# Patient Record
Sex: Female | Born: 1935 | Race: White | Hispanic: No | Smoking: Former smoker
Health system: Southern US, Community
[De-identification: ages and names within clinical notes are randomized; demographics above are authoritative.]

## PROBLEM LIST (undated history)

## (undated) DIAGNOSIS — Z860100 Personal history of colon polyps, unspecified: Secondary | ICD-10-CM

## (undated) DIAGNOSIS — E785 Hyperlipidemia, unspecified: Secondary | ICD-10-CM

## (undated) DIAGNOSIS — F32A Depression, unspecified: Secondary | ICD-10-CM

## (undated) DIAGNOSIS — K219 Gastro-esophageal reflux disease without esophagitis: Secondary | ICD-10-CM

## (undated) DIAGNOSIS — J449 Chronic obstructive pulmonary disease, unspecified: Secondary | ICD-10-CM

## (undated) DIAGNOSIS — M1990 Unspecified osteoarthritis, unspecified site: Secondary | ICD-10-CM

## (undated) DIAGNOSIS — F329 Major depressive disorder, single episode, unspecified: Secondary | ICD-10-CM

## (undated) DIAGNOSIS — I10 Essential (primary) hypertension: Secondary | ICD-10-CM

## (undated) HISTORY — PX: CHOLECYSTECTOMY: SHX55

## (undated) HISTORY — DX: Personal history of colonic polyps: Z86.010

## (undated) HISTORY — DX: Gastro-esophageal reflux disease without esophagitis: K21.9

## (undated) HISTORY — DX: Unspecified osteoarthritis, unspecified site: M19.90

## (undated) HISTORY — DX: Chronic obstructive pulmonary disease, unspecified: J44.9

## (undated) HISTORY — DX: Major depressive disorder, single episode, unspecified: F32.9

## (undated) HISTORY — PX: TONSILLECTOMY: SUR1361

## (undated) HISTORY — DX: Hyperlipidemia, unspecified: E78.5

## (undated) HISTORY — PX: ABDOMINAL HYSTERECTOMY: SHX81

## (undated) HISTORY — PX: OTHER SURGICAL HISTORY: SHX169

## (undated) HISTORY — DX: Depression, unspecified: F32.A

## (undated) HISTORY — PX: APPENDECTOMY: SHX54

## (undated) HISTORY — PX: BREAST EXCISIONAL BIOPSY: SUR124

## (undated) HISTORY — PX: TUBAL LIGATION: SHX77

## (undated) HISTORY — PX: CATARACT EXTRACTION, BILATERAL: SHX1313

## (undated) HISTORY — DX: Essential (primary) hypertension: I10

## (undated) HISTORY — DX: Personal history of colon polyps, unspecified: Z86.0100

## (undated) HISTORY — PX: CATARACT EXTRACTION: SUR2

---

## 1998-09-08 ENCOUNTER — Encounter: Payer: Self-pay | Admitting: General Surgery

## 1998-09-08 ENCOUNTER — Ambulatory Visit (HOSPITAL_COMMUNITY): Admission: RE | Admit: 1998-09-08 | Discharge: 1998-09-09 | Payer: Self-pay | Admitting: General Surgery

## 2000-07-14 ENCOUNTER — Encounter: Admission: RE | Admit: 2000-07-14 | Discharge: 2000-07-14 | Payer: Self-pay | Admitting: Internal Medicine

## 2000-07-14 ENCOUNTER — Encounter: Payer: Self-pay | Admitting: Internal Medicine

## 2002-06-13 ENCOUNTER — Encounter: Admission: RE | Admit: 2002-06-13 | Discharge: 2002-06-13 | Payer: Self-pay | Admitting: Internal Medicine

## 2002-06-13 ENCOUNTER — Encounter: Payer: Self-pay | Admitting: Internal Medicine

## 2002-07-09 ENCOUNTER — Encounter: Payer: Self-pay | Admitting: Internal Medicine

## 2003-03-11 ENCOUNTER — Encounter: Admission: RE | Admit: 2003-03-11 | Discharge: 2003-03-11 | Payer: Self-pay | Admitting: Internal Medicine

## 2003-12-02 ENCOUNTER — Ambulatory Visit: Payer: Self-pay | Admitting: Internal Medicine

## 2004-06-16 ENCOUNTER — Ambulatory Visit: Payer: Self-pay | Admitting: Internal Medicine

## 2004-06-24 ENCOUNTER — Ambulatory Visit (HOSPITAL_COMMUNITY): Admission: RE | Admit: 2004-06-24 | Discharge: 2004-06-24 | Payer: Self-pay | Admitting: Internal Medicine

## 2004-06-24 ENCOUNTER — Encounter: Admission: RE | Admit: 2004-06-24 | Discharge: 2004-06-24 | Payer: Self-pay | Admitting: Internal Medicine

## 2004-07-09 ENCOUNTER — Ambulatory Visit: Payer: Self-pay | Admitting: Gastroenterology

## 2004-07-14 ENCOUNTER — Ambulatory Visit: Payer: Self-pay | Admitting: Internal Medicine

## 2004-08-05 ENCOUNTER — Ambulatory Visit: Payer: Self-pay | Admitting: Gastroenterology

## 2004-08-05 ENCOUNTER — Encounter: Payer: Self-pay | Admitting: Internal Medicine

## 2004-08-31 ENCOUNTER — Emergency Department (HOSPITAL_COMMUNITY): Admission: EM | Admit: 2004-08-31 | Discharge: 2004-09-01 | Payer: Self-pay | Admitting: Emergency Medicine

## 2004-10-13 ENCOUNTER — Ambulatory Visit: Payer: Self-pay | Admitting: Internal Medicine

## 2004-11-13 ENCOUNTER — Ambulatory Visit: Payer: Self-pay | Admitting: Internal Medicine

## 2004-12-25 ENCOUNTER — Ambulatory Visit: Payer: Self-pay | Admitting: Internal Medicine

## 2005-08-23 ENCOUNTER — Ambulatory Visit: Payer: Self-pay | Admitting: Internal Medicine

## 2005-09-24 ENCOUNTER — Ambulatory Visit: Payer: Self-pay | Admitting: Internal Medicine

## 2005-10-06 ENCOUNTER — Ambulatory Visit: Payer: Self-pay | Admitting: Internal Medicine

## 2005-10-08 ENCOUNTER — Encounter: Payer: Self-pay | Admitting: Internal Medicine

## 2005-11-09 ENCOUNTER — Ambulatory Visit: Payer: Self-pay | Admitting: Internal Medicine

## 2005-11-20 ENCOUNTER — Encounter: Admission: RE | Admit: 2005-11-20 | Discharge: 2005-11-20 | Payer: Self-pay | Admitting: Otolaryngology

## 2006-03-13 DIAGNOSIS — I10 Essential (primary) hypertension: Secondary | ICD-10-CM | POA: Insufficient documentation

## 2006-03-13 DIAGNOSIS — J449 Chronic obstructive pulmonary disease, unspecified: Secondary | ICD-10-CM | POA: Insufficient documentation

## 2006-09-06 ENCOUNTER — Encounter (INDEPENDENT_AMBULATORY_CARE_PROVIDER_SITE_OTHER): Payer: Self-pay | Admitting: *Deleted

## 2006-10-10 ENCOUNTER — Ambulatory Visit: Payer: Self-pay | Admitting: Internal Medicine

## 2006-10-10 DIAGNOSIS — F329 Major depressive disorder, single episode, unspecified: Secondary | ICD-10-CM | POA: Insufficient documentation

## 2006-10-10 DIAGNOSIS — F3289 Other specified depressive episodes: Secondary | ICD-10-CM | POA: Insufficient documentation

## 2006-10-10 DIAGNOSIS — K449 Diaphragmatic hernia without obstruction or gangrene: Secondary | ICD-10-CM | POA: Insufficient documentation

## 2006-10-10 DIAGNOSIS — K219 Gastro-esophageal reflux disease without esophagitis: Secondary | ICD-10-CM | POA: Insufficient documentation

## 2006-10-10 DIAGNOSIS — E785 Hyperlipidemia, unspecified: Secondary | ICD-10-CM | POA: Insufficient documentation

## 2006-10-13 LAB — CONVERTED CEMR LAB
ALT: 19 units/L (ref 0–35)
BUN: 13 mg/dL (ref 6–23)
Basophils Relative: 0.7 % (ref 0.0–1.0)
CO2: 30 meq/L (ref 19–32)
Chloride: 98 meq/L (ref 96–112)
Eosinophils Relative: 2 % (ref 0.0–5.0)
GFR calc non Af Amer: 51 mL/min
Glucose, Bld: 177 mg/dL — ABNORMAL HIGH (ref 70–99)
HDL: 26.7 mg/dL — ABNORMAL LOW (ref >39.0)
Hemoglobin: 14.1 g/dL (ref 12.0–15.0)
Hgb A1c MFr Bld: 8.2 % — ABNORMAL HIGH (ref 4.6–6.0)
Platelets: 292 10*3/uL (ref 150–400)
Potassium: 3.8 meq/L (ref 3.5–5.1)
RDW: 13.8 % (ref 11.5–14.6)
Sodium: 139 meq/L (ref 135–145)
Total CHOL/HDL Ratio: 6.1
VLDL: 39 mg/dL (ref 0–40)

## 2006-11-02 ENCOUNTER — Encounter: Payer: Self-pay | Admitting: Internal Medicine

## 2006-11-02 ENCOUNTER — Encounter: Admission: RE | Admit: 2006-11-02 | Discharge: 2006-11-02 | Payer: Self-pay | Admitting: Internal Medicine

## 2006-11-17 ENCOUNTER — Encounter (INDEPENDENT_AMBULATORY_CARE_PROVIDER_SITE_OTHER): Payer: Self-pay | Admitting: *Deleted

## 2006-11-22 ENCOUNTER — Encounter (INDEPENDENT_AMBULATORY_CARE_PROVIDER_SITE_OTHER): Payer: Self-pay | Admitting: *Deleted

## 2006-12-12 ENCOUNTER — Ambulatory Visit: Payer: Self-pay | Admitting: Internal Medicine

## 2006-12-12 DIAGNOSIS — E119 Type 2 diabetes mellitus without complications: Secondary | ICD-10-CM | POA: Insufficient documentation

## 2006-12-12 DIAGNOSIS — E1140 Type 2 diabetes mellitus with diabetic neuropathy, unspecified: Secondary | ICD-10-CM | POA: Insufficient documentation

## 2006-12-12 LAB — CONVERTED CEMR LAB: Glucose, Bld: 136 mg/dL

## 2006-12-13 LAB — CONVERTED CEMR LAB
ALT: 16 units/L (ref 0–35)
AST: 24 units/L (ref 0–37)
BUN: 22 mg/dL (ref 6–23)
Creatinine, Ser: 1.2 mg/dL (ref 0.4–1.2)
Microalb Creat Ratio: 51.4 mg/g — ABNORMAL HIGH (ref 0.0–30.0)

## 2007-04-11 ENCOUNTER — Ambulatory Visit: Payer: Self-pay | Admitting: Internal Medicine

## 2007-04-14 ENCOUNTER — Telehealth (INDEPENDENT_AMBULATORY_CARE_PROVIDER_SITE_OTHER): Payer: Self-pay | Admitting: *Deleted

## 2007-04-14 LAB — CONVERTED CEMR LAB
Calcium: 9.2 mg/dL (ref 8.4–10.5)
Chloride: 98 meq/L (ref 96–112)
Microalb Creat Ratio: 57.9 mg/g — ABNORMAL HIGH (ref 0.0–30.0)
Microalb, Ur: 11.9 mg/dL — ABNORMAL HIGH (ref 0.0–1.9)
Sodium: 141 meq/L (ref 135–145)

## 2007-04-18 ENCOUNTER — Encounter: Payer: Self-pay | Admitting: Internal Medicine

## 2007-05-02 ENCOUNTER — Ambulatory Visit: Payer: Self-pay | Admitting: Internal Medicine

## 2007-05-02 DIAGNOSIS — J309 Allergic rhinitis, unspecified: Secondary | ICD-10-CM | POA: Insufficient documentation

## 2007-05-04 LAB — CONVERTED CEMR LAB
GFR calc Af Amer: 78 mL/min
Glucose, Bld: 100 mg/dL — ABNORMAL HIGH (ref 70–99)

## 2007-08-03 ENCOUNTER — Ambulatory Visit: Payer: Self-pay | Admitting: Internal Medicine

## 2007-08-07 ENCOUNTER — Telehealth (INDEPENDENT_AMBULATORY_CARE_PROVIDER_SITE_OTHER): Payer: Self-pay | Admitting: *Deleted

## 2007-08-07 LAB — CONVERTED CEMR LAB
Creatinine,U: 395.5 mg/dL
Hgb A1c MFr Bld: 7.3 % — ABNORMAL HIGH (ref 4.6–6.0)
Microalb Creat Ratio: 44.5 mg/g — ABNORMAL HIGH (ref 0.0–30.0)
Microalb, Ur: 17.6 mg/dL — ABNORMAL HIGH (ref 0.0–1.9)

## 2007-08-24 ENCOUNTER — Ambulatory Visit: Payer: Self-pay | Admitting: Internal Medicine

## 2007-08-29 ENCOUNTER — Telehealth (INDEPENDENT_AMBULATORY_CARE_PROVIDER_SITE_OTHER): Payer: Self-pay | Admitting: *Deleted

## 2007-08-29 LAB — CONVERTED CEMR LAB
CO2: 29 meq/L (ref 19–32)
Calcium: 8.9 mg/dL (ref 8.4–10.5)
Chloride: 106 meq/L (ref 96–112)
Creatinine, Ser: 0.9 mg/dL (ref 0.4–1.2)
GFR calc Af Amer: 78 mL/min
Glucose, Bld: 131 mg/dL — ABNORMAL HIGH (ref 70–99)
Sodium: 141 meq/L (ref 135–145)

## 2007-10-25 ENCOUNTER — Telehealth (INDEPENDENT_AMBULATORY_CARE_PROVIDER_SITE_OTHER): Payer: Self-pay | Admitting: *Deleted

## 2007-11-03 ENCOUNTER — Ambulatory Visit: Payer: Self-pay | Admitting: Internal Medicine

## 2007-11-06 LAB — CONVERTED CEMR LAB
ALT: 30 units/L (ref 0–35)
Cholesterol: 139 mg/dL (ref 0–200)

## 2007-11-07 ENCOUNTER — Telehealth (INDEPENDENT_AMBULATORY_CARE_PROVIDER_SITE_OTHER): Payer: Self-pay | Admitting: *Deleted

## 2007-12-19 ENCOUNTER — Ambulatory Visit: Payer: Self-pay | Admitting: Internal Medicine

## 2008-03-08 ENCOUNTER — Ambulatory Visit: Payer: Self-pay | Admitting: Internal Medicine

## 2008-03-08 ENCOUNTER — Encounter (INDEPENDENT_AMBULATORY_CARE_PROVIDER_SITE_OTHER): Payer: Self-pay | Admitting: *Deleted

## 2008-03-12 ENCOUNTER — Telehealth (INDEPENDENT_AMBULATORY_CARE_PROVIDER_SITE_OTHER): Payer: Self-pay | Admitting: *Deleted

## 2008-03-12 LAB — CONVERTED CEMR LAB
BUN: 15 mg/dL (ref 6–23)
Basophils Absolute: 0.1 10*3/uL (ref 0.0–0.1)
Calcium: 9.2 mg/dL (ref 8.4–10.5)
Creatinine, Ser: 0.9 mg/dL (ref 0.4–1.2)
GFR calc non Af Amer: 64 mL/min
HCT: 39.1 % (ref 36.0–46.0)
Microalb Creat Ratio: 211.8 mg/g — ABNORMAL HIGH (ref 0.0–30.0)
Platelets: 281 10*3/uL (ref 150–400)
Potassium: 4 meq/L (ref 3.5–5.1)
RBC: 4.96 M/uL (ref 3.87–5.11)
RDW: 14.1 % (ref 11.5–14.6)

## 2008-03-26 ENCOUNTER — Encounter: Admission: RE | Admit: 2008-03-26 | Discharge: 2008-03-26 | Payer: Self-pay | Admitting: Internal Medicine

## 2008-03-26 LAB — HM MAMMOGRAPHY: HM Mammogram: NEGATIVE

## 2008-04-17 ENCOUNTER — Encounter: Payer: Self-pay | Admitting: Internal Medicine

## 2008-04-24 ENCOUNTER — Ambulatory Visit: Payer: Self-pay | Admitting: Internal Medicine

## 2008-07-08 ENCOUNTER — Ambulatory Visit: Payer: Self-pay | Admitting: Internal Medicine

## 2008-07-08 LAB — HM DIABETES FOOT EXAM

## 2008-07-08 LAB — CONVERTED CEMR LAB
Creatinine,U: 296.8 mg/dL
Hgb A1c MFr Bld: 7.1 % — ABNORMAL HIGH (ref 4.6–6.5)
Microalb Creat Ratio: 252 mg/g — ABNORMAL HIGH (ref 0.0–30.0)
Microalb, Ur: 74.8 mg/dL — ABNORMAL HIGH (ref 0.0–1.9)

## 2008-07-09 ENCOUNTER — Ambulatory Visit: Payer: Self-pay | Admitting: Internal Medicine

## 2008-07-10 ENCOUNTER — Encounter (INDEPENDENT_AMBULATORY_CARE_PROVIDER_SITE_OTHER): Payer: Self-pay | Admitting: *Deleted

## 2008-07-15 LAB — CONVERTED CEMR LAB
Ferritin: 9.5 ng/mL — ABNORMAL LOW (ref 10.0–291.0)
Iron: 29 ug/dL — ABNORMAL LOW (ref 42–145)

## 2008-07-16 ENCOUNTER — Encounter (INDEPENDENT_AMBULATORY_CARE_PROVIDER_SITE_OTHER): Payer: Self-pay | Admitting: *Deleted

## 2008-07-26 ENCOUNTER — Ambulatory Visit: Payer: Self-pay | Admitting: Internal Medicine

## 2008-07-26 LAB — CONVERTED CEMR LAB: Fecal Occult Bld: NEGATIVE

## 2008-07-31 ENCOUNTER — Telehealth (INDEPENDENT_AMBULATORY_CARE_PROVIDER_SITE_OTHER): Payer: Self-pay | Admitting: *Deleted

## 2008-08-14 DIAGNOSIS — Z860100 Personal history of colon polyps, unspecified: Secondary | ICD-10-CM | POA: Insufficient documentation

## 2008-08-21 ENCOUNTER — Ambulatory Visit: Payer: Self-pay | Admitting: Gastroenterology

## 2008-08-21 DIAGNOSIS — D509 Iron deficiency anemia, unspecified: Secondary | ICD-10-CM | POA: Insufficient documentation

## 2008-08-21 DIAGNOSIS — K625 Hemorrhage of anus and rectum: Secondary | ICD-10-CM | POA: Insufficient documentation

## 2008-08-21 LAB — CONVERTED CEMR LAB
ALT: 12 units/L (ref 0–35)
AST: 20 units/L (ref 0–37)
Alkaline Phosphatase: 68 units/L (ref 39–117)
Basophils Absolute: 0.1 10*3/uL (ref 0.0–0.1)
Basophils Relative: 1 % (ref 0.0–3.0)
Bilirubin, Direct: 0.1 mg/dL (ref 0.0–0.3)
CO2: 32 meq/L (ref 19–32)
Creatinine, Ser: 1 mg/dL (ref 0.4–1.2)
GFR calc non Af Amer: 56.63 mL/min (ref >60)
Glucose, Bld: 117 mg/dL — ABNORMAL HIGH (ref 70–99)
Iron: 37 ug/dL — ABNORMAL LOW (ref 42–145)
Lymphocytes Relative: 27.2 % (ref 12.0–46.0)
Neutrophils Relative %: 61.4 % (ref 43.0–77.0)
Platelets: 310 10*3/uL (ref 150.0–400.0)
RBC: 4.95 M/uL (ref 3.87–5.11)
Total Bilirubin: 0.5 mg/dL (ref 0.3–1.2)
Transferrin: 305.3 mg/dL (ref 212.0–360.0)
Vitamin B-12: 364 pg/mL (ref 211–911)
WBC: 10 10*3/uL (ref 4.5–10.5)

## 2008-09-09 ENCOUNTER — Ambulatory Visit: Payer: Self-pay | Admitting: Gastroenterology

## 2008-09-09 LAB — HM COLONOSCOPY

## 2008-11-07 ENCOUNTER — Ambulatory Visit: Payer: Self-pay | Admitting: Internal Medicine

## 2008-11-13 ENCOUNTER — Encounter (INDEPENDENT_AMBULATORY_CARE_PROVIDER_SITE_OTHER): Payer: Self-pay | Admitting: *Deleted

## 2008-11-13 LAB — CONVERTED CEMR LAB
Creatinine,U: 291.3 mg/dL
Hgb A1c MFr Bld: 6.5 % (ref 4.6–6.5)
Microalb Creat Ratio: 135.6 mg/g — ABNORMAL HIGH (ref 0.0–30.0)
Triglycerides: 105 mg/dL (ref 0.0–149.0)

## 2009-03-10 ENCOUNTER — Ambulatory Visit: Payer: Self-pay | Admitting: Internal Medicine

## 2009-03-12 LAB — CONVERTED CEMR LAB
BUN: 18 mg/dL (ref 6–23)
Calcium: 9.3 mg/dL (ref 8.4–10.5)
Chloride: 103 meq/L (ref 96–112)
Creatinine, Ser: 1 mg/dL (ref 0.4–1.2)

## 2009-03-31 ENCOUNTER — Ambulatory Visit: Payer: Self-pay | Admitting: Family

## 2009-04-17 ENCOUNTER — Encounter: Payer: Self-pay | Admitting: Internal Medicine

## 2009-04-17 LAB — HM DIABETES EYE EXAM: HM Diabetic Eye Exam: NORMAL

## 2009-07-08 ENCOUNTER — Ambulatory Visit: Payer: Self-pay | Admitting: Internal Medicine

## 2009-07-08 DIAGNOSIS — R220 Localized swelling, mass and lump, head: Secondary | ICD-10-CM | POA: Insufficient documentation

## 2009-07-08 DIAGNOSIS — R221 Localized swelling, mass and lump, neck: Secondary | ICD-10-CM

## 2009-07-08 DIAGNOSIS — M1990 Unspecified osteoarthritis, unspecified site: Secondary | ICD-10-CM | POA: Insufficient documentation

## 2009-07-08 LAB — CONVERTED CEMR LAB: Vit D, 25-Hydroxy: 47 ng/mL (ref 30–89)

## 2009-07-14 ENCOUNTER — Ambulatory Visit: Payer: Self-pay | Admitting: Internal Medicine

## 2009-07-14 ENCOUNTER — Encounter: Payer: Self-pay | Admitting: Internal Medicine

## 2009-07-15 LAB — CONVERTED CEMR LAB
AST: 35 units/L (ref 0–37)
Basophils Absolute: 0.1 10*3/uL (ref 0.0–0.1)
Eosinophils Absolute: 0.2 10*3/uL (ref 0.0–0.7)
HCT: 38.4 % (ref 36.0–46.0)
Hemoglobin: 12.4 g/dL (ref 12.0–15.0)
Hgb A1c MFr Bld: 6.7 % — ABNORMAL HIGH (ref 4.6–6.5)
Iron: 40 ug/dL — ABNORMAL LOW (ref 42–145)
MCHC: 32.2 g/dL (ref 30.0–36.0)
Monocytes Absolute: 1 10*3/uL (ref 0.1–1.0)
Neutrophils Relative %: 62.2 % (ref 43.0–77.0)
Platelets: 304 10*3/uL (ref 150.0–400.0)
Saturation Ratios: 8.7 % — ABNORMAL LOW (ref 20.0–50.0)
Total CHOL/HDL Ratio: 4
Transferrin: 327.7 mg/dL (ref 212.0–360.0)
Triglycerides: 143 mg/dL (ref 0.0–149.0)

## 2009-11-07 ENCOUNTER — Ambulatory Visit: Payer: Self-pay | Admitting: Internal Medicine

## 2009-11-11 LAB — CONVERTED CEMR LAB
Calcium: 9.1 mg/dL (ref 8.4–10.5)
Creatinine, Ser: 0.9 mg/dL (ref 0.4–1.2)
GFR calc non Af Amer: 68.1 mL/min (ref >60)
Hemoglobin: 12.2 g/dL (ref 12.0–15.0)
Sodium: 142 meq/L (ref 135–145)

## 2010-02-04 ENCOUNTER — Telehealth (INDEPENDENT_AMBULATORY_CARE_PROVIDER_SITE_OTHER): Payer: Self-pay | Admitting: *Deleted

## 2010-02-19 NOTE — Assessment & Plan Note (Signed)
Summary: yearly checkup/fasting/kdc   Vital Signs:  Patient profile:   77 year old female Height:      66.25 inches Weight:      205 pounds Temp:     98.3. degrees F oral Pulse rate:   82 / minute Resp:     24 per minute BP sitting:   140 / 70  (left arm)  Vitals Entered By: Rolla Flatten CMA (July 08, 2009 10:05 AM) CC: yearly Comments -fasting REVIEWED MED LIST, PATIENT AGREED DOSE AND INSTRUCTION CORRECT    History of Present Illness: yearly, chart reviewed   c/o knee pain on-off, occasionally swelling, does hurt at night has taken tylenol, helped some? tried celebrex years ago  c/o a mass at the neck, no pain, no dyscomfort ; started years ago, more noticeable lately   HYPERTENSION  -- ambulatory BPs ususally in the 140s   AODM  -- good medication compliance , no ambulatory CBGs   HYPERLIPIDEMIA -- good medication compliance      Allergies (verified): No Known Drug Allergies  Past History:  Past Medical History: Reviewed history from 03/10/2009 and no changes required. HYPERTENSION   AODM   HYPERLIPIDEMIA  DEPRESSION   COPD and  ASTHMA  GERD  and  HH  WITH REFLUX   COLONIC POLYPS  Allergic rhinitis  Past Surgical History: Reviewed history from 08/21/2008 and no changes required. Appendectomy Cataract extraction-bilateral Cholecystectomy Tubal ligation Tonsillectomy Hysterectomy Bilateral Breast Tumor  Family History: Reviewed history from 08/21/2008 and no changes required. breast cancer: sister (x2?) colon cancer : no DM: F, sister Heart attacks : brother (early 25s), Son Family History of Pancreatic Cancer: Son Family History of Stomach Cancer: Sister  Social History: Reviewed history from 08/21/2008 and no changes required. daughter lives w/her, has another daughter widow (1994) Has lost 3 sons Retired Patient is a former smoker. Quit 1994 Alcohol Use - no Illicit Drug Use - no Patient does not get regular exercise.   Review  of Systems General:  Denies fatigue, fever, and weight loss. CV:  Denies chest pain or discomfort and swelling of feet. Resp:  uses spiriva daily, uses ventolin almost once daily (increased compared to last few months); thinks wheezing triggered by the warm weather  . GI:  hematochezia has decreased  on benifiber and colace, has soft stools and no constipation . GU:  no vag d/c or bleed  does SBE, ok . Psych:  Denies anxiety and depression; mood ok .  Physical Exam  General:  alert, well-developed, and well-nourished.   Neck:  no thyromegaly.   under the left side of the jaw, has a 1.5 inch mass, soft, nontender, not  hard and not attached deeper structures Breasts:  No mass, nodules, thickening, tenderness, bulging, retraction, inflamation, nipple discharge or skin changes noted.  no axillary lymphadenopathy  Lungs:  normal respiratory effort, no intercostal retractions, no accessory muscle use, and decreased  breath sounds but clear  Heart:  normal rate, regular rhythm, no murmur, and no gallop.   Abdomen:  soft, non-tender, and no distention.   Extremities:  no roller check edema Both knees has deformities consistent with OA. No warmness, no redness. Very mild effusion on the left side? Psych:  Oriented X3, memory intact for recent and remote, normally interactive, good eye contact, not anxious appearing, and not depressed appearing.     Impression & Recommendations:  Problem # 1:  DEGENERATIVE JOINT DISEASE (ICD-715.90) knee pain likely from osteoarthritis Plan: Trial with a low  dose of Celebrex, continue Tylenol Her updated medication list for this problem includes:    Bayer Low Strength 81 Mg Tbec (Aspirin) .Marland Kitchen... Take 1 tablet by mouth once a day    Celebrex 100 Mg Caps (Celecoxib) ..... One by mouth twice a day as needed for pain. take it with food  Problem # 2:  HEALTH SCREENING (ICD-V70.0) Td 2002 pneumonia shot 2010 shingles shot-- gave  one today  breast exam today  is neg MMG was neg 03-2008, patient knows she is due. Plans to call no further Paps  Cscope 2004 : tics, hemorrhoids  had rectal bleeding and anemia, colonoscopyt 08/2008 : tics, no polyps  Last bone density test 10/08--- normal, ordering one  Orders: Radiology Referral (Radiology)  Problem # 3:  NECK MASS (ICD-784.2) the left sided neck mass is more noticeable lately today's physical exam features are  benign She is a status post eval  at ENT in 2007, a CT showed possible lipoma, they recommend observation Plan:  Observation versus ENT referral we elected to recheck in few months   Problem # 4:  ANEMIA, IRON DEFICIENCY (ICD-280.9) had a colonoscopy last year rectal bleeding  has decreased in frequency Labs   Hgb: 13.2 (11/07/2008)   Hct: 38.3 (08/21/2008)   Platelets: 310.0 (08/21/2008) RBC: 4.95 (08/21/2008)   RDW: 15.6 (08/21/2008)   WBC: 10.0 (08/21/2008) MCV: 77.3 (08/21/2008)   MCHC: 32.2 (08/21/2008) Ferritin: 8.9 (08/21/2008) Iron: 37 (08/21/2008)   % Sat: 8.7 (08/21/2008) B12: 364 (08/21/2008)   Folate: >20.0 ng/mL (08/21/2008)   TSH: 2.30 (08/21/2008)  Orders: TLB-CBC Platelet - w/Differential (85025-CBCD) TLB-IBC Pnl (Iron/FE;Transferrin) (83550-IBC)  Problem # 5:  AODM (ICD-250.00) labs Her updated medication list for this problem includes:    Benazepril Hcl 20 Mg Tabs (Benazepril hcl) .Marland Kitchen... 1 by mouth bid    Glucophage 500 Mg Tabs (Metformin hcl) .Marland Kitchen... 2 by mouth once daily    Bayer Low Strength 81 Mg Tbec (Aspirin) .Marland Kitchen... Take 1 tablet by mouth once a day    Labs Reviewed: Creat: 1.0 (03/10/2009)     Last Eye Exam: normal (04/17/2009) Reviewed HgBA1c results: 6.6 (03/10/2009)  6.5 (11/07/2008)  Orders: TLB-A1C / Hgb A1C (Glycohemoglobin) (83036-A1C)  Problem # 6:  HYPERTENSION (ICD-401.9) no change, labs Her updated medication list for this problem includes:    Benazepril Hcl 20 Mg Tabs (Benazepril hcl) .Marland Kitchen... 1 by mouth bid    Toprol Xl 25 Mg  Xr24h-tab (Metoprolol succinate) .Marland Kitchen... 1.5  by mouth once daily    Norvasc 10 Mg Tabs (Amlodipine besylate) .Marland Kitchen... 1 by mouth once daily    BP today: 140/70 Prior BP: 140/870 (03/31/2009)  Labs Reviewed: K+: 4.9 (03/10/2009) Creat: : 1.0 (03/10/2009)   Chol: 129 (11/07/2008)   HDL: 34.70 (11/07/2008)   LDL: 73 (11/07/2008)   TG: 105.0 (11/07/2008)  Orders: T-Vitamin D (25-Hydroxy) [id number])  Problem # 7:  HYPERLIPIDEMIA (ICD-272.4) due for labs Her updated medication list for this problem includes:    Crestor 10 Mg Tabs (Rosuvastatin calcium) .Marland Kitchen... Take 1/2 tablet by mouth once a day    Labs Reviewed: SGOT: 20 (08/21/2008)   SGPT: 12 (08/21/2008)   HDL:34.70 (11/07/2008), 29.0 (11/03/2007)  LDL:73 (11/07/2008), 81 (11/03/2007)  Chol:129 (11/07/2008), 139 (11/03/2007)  Trig:105.0 (11/07/2008), 146 (11/03/2007)  Orders: Venipuncture [id number]) TLB-Lipid Panel (80061-LIPID) TLB-ALT (SGPT) (84460-ALT) TLB-AST (SGOT) (84450-SGOT)  Problem # 8:  ASTHMA (ICD-493.90) using more albuterol unusual, patient believes you to the warm weather. No change for now, if the increased use of  albuterol continued through the winter, will add another medication possibly a steroid Her updated medication list for this problem includes:    Spiriva Handihaler 18 Mcg Caps (Tiotropium bromide monohydrate) .Marland Kitchen... 1 by mouth once daily [phone]    Ventolin Hfa 108 (90 Base) Mcg/act Aers (Albuterol sulfate) .Marland Kitchen... 2 puffs as needed wheezing evry 6 hours  Pulmonary Functions Reviewed: O2 sat: 98 (03/31/2009)  Complete Medication List: 1)  Spiriva Handihaler 18 Mcg Caps (Tiotropium bromide monohydrate) .Marland Kitchen.. 1 by mouth once daily [phone] 2)  Ventolin Hfa 108 (90 Base) Mcg/act Aers (Albuterol sulfate) .... 2 puffs as needed wheezing evry 6 hours 3)  Benazepril Hcl 20 Mg Tabs (Benazepril hcl) .Marland Kitchen.. 1 by mouth bid 4)  Toprol Xl 25 Mg Xr24h-tab (Metoprolol succinate) .... 1.5  by mouth once daily 5)  Norvasc  10 Mg Tabs (Amlodipine besylate) .Marland Kitchen.. 1 by mouth once daily 6)  Glucophage 500 Mg Tabs (Metformin hcl) .... 2 by mouth once daily 7)  Bayer Low Strength 81 Mg Tbec (Aspirin) .... Take 1 tablet by mouth once a day 8)  Crestor 10 Mg Tabs (Rosuvastatin calcium) .... Take 1/2 tablet by mouth once a day 9)  Protonix 40 Mg Tbec (Pantoprazole sodium) .Marland Kitchen.. 1 by mouth once daily [phone] 10)  Claritin 10 Mg Tabs (Loratadine) .... Take 1 tablet by mouth once a day 11)  Centrum Silver Tabs (Multiple vitamins-minerals) .... Take 1 tablet by mouth once a day 12)  Mucinex Dm 30-600 Mg Tb12 (Dextromethorphan-guaifenesin) .... Take 1 tablet by mouth once a day 13)  Colace 100 Mg Caps (Docusate sodium) .... Take 1-2 tablets by mouth as needed 14)  Benefiber Powd (Wheat dextrin) .... Mix one tablespoon in cereal in morning 15)  Cvs Cortisone + Max St 1 % Crea (Hydrocortisone) .... Apply twice daily until rash is resolved 16)  Celebrex 100 Mg Caps (Celecoxib) .... One by mouth twice a day as needed for pain. take it with food  Other Orders: Prescription Created Electronically [phone]) Zoster (Shingles) Vaccine Live [phone]) Admin 1st Vaccine [phone]) Admin 1st Vaccine [facility]) [phone])  Patient Instructions: 1)  knee pain-- takes Celebrex twice a day as needed. Take it with food and watch for stomach irritation.  2)  You can also take Tylenol 500 mg one or 2 tablets every 6 hours as needed 3)  Please schedule a follow-up appointment in 4 months .  Prescriptions: CELEBREX 100 MG CAPS (CELECOXIB) one by mouth twice a day as needed for pain. Take it with food  #60 x 3   Entered and Authorized by:   Alda Berthold. Paz MD   Signed by:   Alda Berthold. Paz MD on 07/08/2009   Method used:   Electronically to        [facility] [id number]* (retail)       [place], [place]  [id number]       Ph: [phone]       Fax: [phone]   RxID:    [id number]    Zostavax # 1    Vaccine Type: Zostavax    Site: RIGHT [place]    Mfr: Merck    Dose: 0.65 ML    Route: [place]    Given by: Rolla Flatten CMA    Exp. Date: 08/13/2010    Lot #: [id number]    VIS given: 10/30/04 given July 08, 2009.

## 2010-02-19 NOTE — Assessment & Plan Note (Signed)
Summary: 4 MONTH OV//PH   Vital Signs:  Patient profile:   77 year old female Height:      66.25 inches O2 Sat:      93 % on Room air Pulse rate:   98 / minute BP sitting:   160 / 88  Vitals Entered By: Dawson Bills (March 10, 2009 10:15 AM)  O2 Flow:  Room air CC: rov - fasting, out of toprol x 3 days   History of Present Illness: HYPERTENSION  -- out of toprol x 3 days, ambulatory BPs  were ok  AODM  -- ambulatory CBGs , good medication compliance   HYPERLIPIDEMIA -- good medication compliance   COPD and  ASTHMA -- cough in AM yellow sputum , taking meds as Rx , uses albuterol 1-2 x  week   Allergies: No Known Drug Allergies  Past History:  Past Medical History: HYPERTENSION   AODM   HYPERLIPIDEMIA  DEPRESSION   COPD and  ASTHMA  GERD  and  HH  WITH REFLUX   COLONIC POLYPS  Allergic rhinitis  Past Surgical History: Reviewed history from 08/21/2008 and no changes required. Appendectomy Cataract extraction-bilateral Cholecystectomy Tubal ligation Tonsillectomy Hysterectomy Bilateral Breast Tumor  Social History: Reviewed history from 08/21/2008 and no changes required. daughter lives w/her, has another daughter widow (1994) Has lost 3 sons Retired Patient is a former smoker. Quit 1994 Alcohol Use - no Illicit Drug Use - no Patient does not get regular exercise.   Review of Systems General:  Denies fever; occasionally fatige . CV:  Denies chest pain or discomfort and swelling of feet. Resp:  Denies coughing up blood; + DOE  "if walk to far or go upsateirs too fast" ? worse than last year ----> "hard to say". GI:  Denies diarrhea, nausea, and vomiting. Endo:  no low sugar symptoms .  Physical Exam  General:  alert and well-developed.   Lungs:  normal respiratory effort, no intercostal retractions, no accessory muscle use, and decreased  breath sounds but clear  Heart:  normal rate, regular rhythm, no murmur, and no gallop.   Extremities:   no pretibial edema bilaterally    Impression & Recommendations:  Problem # 1:  HYPERTENSION (ICD-401.9) reports good ambulatory BPs , no change , rf (runned out of toprol x 3 days ) Her updated medication list for this problem includes:    Benazepril Hcl 20 Mg Tabs (Benazepril hcl) .Marland Kitchen... 1 by mouth bid    Toprol Xl 25 Mg Xr24h-tab (Metoprolol succinate) .Marland Kitchen... 1.5  by mouth once daily    Norvasc 10 Mg Tabs (Amlodipine besylate) .Marland Kitchen... 1 by mouth once daily    BP today: 160/88 Prior BP: 120/63 (11/07/2008)  Labs Reviewed: K+: 4.5 (08/21/2008) Creat: : 1.0 (08/21/2008)   Chol: 129 (11/07/2008)   HDL: 34.70 (11/07/2008)   LDL: 73 (11/07/2008)   TG: 105.0 (11/07/2008)  Orders: Venipuncture [id number]) TLB-BMP (Basic Metabolic Panel-BMET) (99991111)  Problem # 2:  AODM (ICD-250.00) seems well controlled  Her updated medication list for this problem includes:    Benazepril Hcl 20 Mg Tabs (Benazepril hcl) .Marland Kitchen... 1 by mouth bid    Glucophage 500 Mg Tabs (Metformin hcl) .Marland Kitchen... 2 by mouth once daily    Bayer Low Strength 81 Mg Tbec (Aspirin) .Marland Kitchen... Take 1 tablet by mouth once a day    Labs Reviewed: Creat: 1.0 (08/21/2008)    Reviewed HgBA1c results: 6.5 (11/07/2008)  7.1 (07/08/2008)  Orders: TLB-A1C / Hgb A1C (Glycohemoglobin) (83036-A1C)  Problem #  3:  COPD (ICD-496) see ROS, has DOE rec daily exercise, keep herself active to prevent deconditioning  Her updated medication list for this problem includes:    Ventolin Hfa 108 (90 Base) Mcg/act Aers (Albuterol sulfate) .Marland Kitchen... 2 puffs as needed wheezing evry 6 hours    Spiriva Handihaler 18 Mcg Caps (Tiotropium bromide monohydrate) .Marland Kitchen... 1 by mouth once daily [phone]  Problem # 4:  HYPERLIPIDEMIA (ICD-272.4) at goal  Her updated medication list for this problem includes:    Crestor 10 Mg Tabs (Rosuvastatin calcium) .Marland Kitchen... Take 1/2 tablet by mouth once a day  Labs Reviewed: SGOT: 20 (08/21/2008)   SGPT: 12 (08/21/2008)    HDL:34.70 (11/07/2008), 29.0 (11/03/2007)  LDL:73 (11/07/2008), 81 (11/03/2007)  Chol:129 (11/07/2008), 139 (11/03/2007)  Trig:105.0 (11/07/2008), 146 (11/03/2007)  Complete Medication List: 1)  Benazepril Hcl 20 Mg Tabs (Benazepril hcl) .Marland Kitchen.. 1 by mouth bid 2)  Toprol Xl 25 Mg Xr24h-tab (Metoprolol succinate) .... 1.5  by mouth once daily 3)  Norvasc 10 Mg Tabs (Amlodipine besylate) .Marland Kitchen.. 1 by mouth once daily 4)  Glucophage 500 Mg Tabs (Metformin hcl) .... 2 by mouth once daily 5)  Bayer Low Strength 81 Mg Tbec (Aspirin) .... Take 1 tablet by mouth once a day 6)  Centrum Silver Tabs (Multiple vitamins-minerals) .... Take 1 tablet by mouth once a day 7)  Crestor 10 Mg Tabs (Rosuvastatin calcium) .... Take 1/2 tablet by mouth once a day 8)  Protonix 40 Mg Tbec (Pantoprazole sodium) .Marland Kitchen.. 1 by mouth once daily [phone] 9)  Claritin 10 Mg Tabs (Loratadine) .... Take 1 tablet by mouth once a day 10)  Mucinex Dm 30-600 Mg Tb12 (Dextromethorphan-guaifenesin) .... Take 1 tablet by mouth once a day 11)  Colace 100 Mg Caps (Docusate sodium) .... Take 1-2 tablets by mouth as needed 12)  Ventolin Hfa 108 (90 Base) Mcg/act Aers (Albuterol sulfate) .... 2 puffs as needed wheezing evry 6 hours 13)  Spiriva Handihaler 18 Mcg Caps (Tiotropium bromide monohydrate) .Marland Kitchen.. 1 by mouth once daily [phone] 14)  Tandem Plus 162-115.2-1 Mg Caps (Fefum-fepo-fa-b cmp-c-zn-mn-cu) .... Take one capsule by mouth daily 15)  Benefiber Powd (Wheat dextrin) .... Mix one tablespoon in cereal in morning  Patient Instructions: 1)  Please schedule a follow-up appointment in 4 months (fasting , yearly) Prescriptions: TANDEM PLUS 162-115.2-1 MG  CAPS (FEFUM-FEPO-FA-B CMP-C-ZN-MN-CU) Take one capsule by mouth daily  #90 x 1   Entered by:   Dawson Bills   Authorized by:   Alda Berthold. Mase Dhondt MD   Signed by:   Dawson Bills on 03/10/2009   Method used:   Electronically to        [facility] (mail-order)             ,          Ph:  [phone]       Fax: [phone]   RxID:   [id number] SPIRIVA HANDIHALER 18 MCG  CAPS (TIOTROPIUM BROMIDE MONOHYDRATE) 1 by mouth once daily [id number]  #3 months x 1   Entered by:   Dawson Bills   Authorized by:   Alda Berthold. Alizae Bechtel MD   Signed by:   Dawson Bills on 03/10/2009   Method used:   Electronically to        [facility] (mail-order)             ,          Ph: [phone]       Fax: [phone]  [id number] VENTOLIN HFA 108 (90 BASE) MCG/ACT  AERS (ALBUTEROL SULFATE) 2 puffs as needed wheezing evry 6 hours  #3 x 1   Entered by:   Dawson Bills   Authorized by:   Alda Berthold. Calah Gershman MD   Signed by:   Dawson Bills on 03/10/2009   Method used:   Electronically to        [facility] (mail-order)             ,          Ph: [phone]       Fax: [phone]   RxID:   [id number] PROTONIX 40 MG TBEC (PANTOPRAZOLE SODIUM) 1 by mouth once daily [phone]  #90 x 1   Entered by:   Dawson Bills   Authorized by:   Alda Berthold. Kerri Kovacik MD   Signed by:   Dawson Bills on 03/10/2009   Method used:   Electronically to        [facility] (mail-order)             ,          Ph: [phone]       Fax: [phone]   RxID:   [id number] CRESTOR 10 MG TABS (ROSUVASTATIN CALCIUM) Take 1/2 tablet by mouth once a day  #45 x 1   Entered by:   Dawson Bills   Authorized by:   Alda Berthold. Jaivian Battaglini MD   Signed by:   Dawson Bills on 03/10/2009   Method used:   Electronically to        [facility] (mail-order)             ,          Ph: [phone]       Fax: [phone]   RxID:   [id number] GLUCOPHAGE 500 MG  TABS (METFORMIN HCL) 2 by mouth once daily  #180 x 1   Entered by:   Dawson Bills   Authorized by:   Alda Berthold. Jayne Peckenpaugh MD   Signed by:   Dawson Bills on 03/10/2009   Method used:   Electronically to        [facility] (mail-order)             ,          Ph: [phone]       Fax: [phone]   RxID:   [id number] [place] 10 MG TABS (AMLODIPINE BESYLATE) 1  by mouth once daily  #90 x 1   Entered by:   Dawson Bills   Authorized by:   Alda Berthold. Maliik Karner MD   Signed by:   Dawson Bills on 03/10/2009   Method used:   Electronically to        [facility] (mail-order)             ,          Ph: [phone]       Fax: [phone]   RxID:   [id number] TOPROL XL 25 MG XR24H-TAB (METOPROLOL SUCCINATE) 1.5  by mouth once daily  #135 x 1   Entered by:   Dawson Bills   Authorized by:   Alda Berthold. Kees Idrovo MD   Signed by:   Dawson Bills on 03/10/2009   Method used:   Electronically to        [facility] (mail-order)             ,  Ph: [phone]       Fax: [phone]   [id number] BENAZEPRIL HCL 20 MG  TABS (BENAZEPRIL HCL) 1 by mouth bid  #180 x 1   Entered by:   Dawson Bills   Authorized by:   Alda Berthold. Keshawna Dix MD   Signed by:   Dawson Bills on 03/10/2009   Method used:   Electronically to        [facility] (mail-order)             ,          Ph: [phone]       Fax: [phone]   RxID:   [id number] TOPROL XL 25 MG XR24H-TAB (METOPROLOL SUCCINATE) 1.5  by mouth once daily  #45 x 0   Entered by:   Dawson Bills   Authorized by:   Alda Berthold. Riko Lumsden MD   Signed by:   Dawson Bills on 03/10/2009   Method used:   Electronically to        [facility]. [id number]* (retail)       [place],   [id number]       Ph: [phone]       Fax: [phone]   RxID:   [id number]

## 2010-02-19 NOTE — Miscellaneous (Signed)
Summary: neg dm eye exam   Clinical Lists Changes  Observations: Added new observation of DMEYEEXAMNXT: 04/2010 (04/17/2009 8:48) Added new observation of DMEYEEXMRES: normal (04/17/2009 8:48) Added new observation of EYE EXAM BY: [facility] (04/17/2009 8:48) Added new observation of DIAB EYE EX: normal (04/17/2009 8:48)       Diabetes Management Exam:    Eye Exam:       Eye Exam done elsewhere          Date: 04/17/2009          Results: normal          Done by: [facility]

## 2010-02-19 NOTE — Letter (Signed)
Summary: [facility]  [facility]   Imported By: Edmonia James 04/28/2009 08:32:52  _____________________________________________________________________  External Attachment:    Type:   Image     Comment:   External Document

## 2010-02-19 NOTE — Assessment & Plan Note (Signed)
Summary: RASH ON RT HAND & ARM/RH.........Marland Kitchen   Vital Signs:  Patient profile:   77 year old female Weight:      206.50 pounds BMI:     33.20 O2 Sat:      98 % on Room air Temp:     98.5 degrees F oral Pulse rate:   70 / minute Pulse rhythm:   regular BP sitting:   140 / 870  (left arm) Cuff size:   regular  Vitals Entered By: Kelle Darting CMA (March 31, 2009 9:37 AM)  O2 Flow:  Room air CC: ROOM 17  Itchy, rash on right hand x 1 week. Now getting worse, not relieved with Benadryl cream.   Primary Care Provider:  Kathlene November, MD  CC:  ROOM 17  Itchy, rash on right hand x 1 week. Now getting worse, and not relieved with Benadryl cream..  History of Present Illness: Rebecca Elliott is an 77 year old female who presents today with c/o itching rash on hands x 10 days.  Notes that she did start using a new body wash recently.  Has tried benedryl cream without improvement.  Denies any worsening of her baseline SOB., wheezing or associated hives.  Denies new medicine.    Allergies (verified): No Known Drug Allergies  Physical Exam  General:  Well-developed,well-nourished,in no acute distress; alert,appropriate and cooperative throughout examination Head:  Normocephalic and atraumatic without obvious abnormalities. No apparent alopecia or balding. Skin:  excoriated rash noted on bilateral hands and right forearm.     Impression & Recommendations:  Problem # 1:  DERMATITIS, ALLERGIC (ICD-692.9) Assessment New I suspect that this is due to recent change in soap.  I recommended that patient return to her old soap and apply cortisone cream as needed for itching until healed.   Her updated medication list for this problem includes:    Claritin 10 Mg Tabs (Loratadine) .Marland Kitchen... Take 1 tablet by mouth once a day    Cvs Cortisone + Max St 1 % Crea (Hydrocortisone) .Marland Kitchen... Apply twice daily until rash is resolved  Complete Medication List: 1)  Benazepril Hcl 20 Mg Tabs (Benazepril hcl) .Marland Kitchen.. 1 by  mouth bid 2)  Toprol Xl 25 Mg Xr24h-tab (Metoprolol succinate) .... 1.5  by mouth once daily 3)  Norvasc 10 Mg Tabs (Amlodipine besylate) .Marland Kitchen.. 1 by mouth once daily 4)  Glucophage 500 Mg Tabs (Metformin hcl) .... 2 by mouth once daily 5)  Bayer Low Strength 81 Mg Tbec (Aspirin) .... Take 1 tablet by mouth once a day 6)  Centrum Silver Tabs (Multiple vitamins-minerals) .... Take 1 tablet by mouth once a day 7)  Crestor 10 Mg Tabs (Rosuvastatin calcium) .... Take 1/2 tablet by mouth once a day 8)  Protonix 40 Mg Tbec (Pantoprazole sodium) .Marland Kitchen.. 1 by mouth once daily [phone] 9)  Claritin 10 Mg Tabs (Loratadine) .... Take 1 tablet by mouth once a day 10)  Mucinex Dm 30-600 Mg Tb12 (Dextromethorphan-guaifenesin) .... Take 1 tablet by mouth once a day 11)  Colace 100 Mg Caps (Docusate sodium) .... Take 1-2 tablets by mouth as needed 12)  Ventolin Hfa 108 (90 Base) Mcg/act Aers (Albuterol sulfate) .... 2 puffs as needed wheezing evry 6 hours 13)  Spiriva Handihaler 18 Mcg Caps (Tiotropium bromide monohydrate) .Marland Kitchen.. 1 by mouth once daily [phone] 14)  Benefiber Powd (Wheat dextrin) .... Mix one tablespoon in cereal in morning 15)  Cvs Cortisone + Max St 1 % Crea (Hydrocortisone) .... Apply twice daily until  rash is resolved  Patient Instructions: 1)  Please return to your old soap. 2)  Call if your rash worsens or does not improve.  Current Allergies (reviewed today): No known allergies

## 2010-02-19 NOTE — Miscellaneous (Signed)
Summary: BONE DENSITY  Clinical Lists Changes  Orders: Added new Test order of T-Bone Densitometry (77080) - Signed Added new Test order of T-Lumbar Vertebral Assessment (77082) - Signed 

## 2010-02-19 NOTE — Progress Notes (Signed)
Summary: Spiriva refill  Phone Note Refill Request Message from:  Fax from Pharmacy on February 04, 2010 4:47 PM  Refills Requested: Medication #1:  Rebecca Elliott 18 MCG  CAPS 1 by mouth once daily [phone] [facility],    fax - [phone]  Next Appointment Scheduled: Tues  03/10/2010   Paz Initial call taken by: Berneta Sages,  February 04, 2010 4:48 PM    Prescriptions: SPIRIVA HANDIHALER 18 MCG  CAPS (TIOTROPIUM BROMIDE MONOHYDRATE) 1 by mouth once daily [phone]  #3 months x 1   Entered by:   Allyn Kenner CMA   Authorized by:   Alda Berthold. Paz MD   Signed by:   Allyn Kenner CMA on 02/04/2010   Method used:   Faxed to ...       [facility] ([facility]order)       [address] [place]  [address]       Ph: [phone]       Fax: [phone]   RxID:   [id number]

## 2010-02-19 NOTE — Assessment & Plan Note (Signed)
Summary: rto 4 months.cbs   Vital Signs:  Patient profile:   77 year old female Weight:      202.38 pounds Pulse rate:   76 / minute Pulse rhythm:   regular BP sitting:   118 / 82  (left arm) Cuff size:   regular  Vitals Entered By: [place] (November 07, 2009 9:14 AM) CC: 4 month f/u visit- fasting  Comments flu shot [facility]   History of Present Illness: routine office visit, fasting  HYPERTENSION  -- ambulatory BPs usually slightly  higher than today  ~ 140/? AODM  -- no ambulatory CBGs , does watch her diet , not very active  DEPRESSION--sister  with dementia, currently in hospice. + stress  COPD and  ASTHMA --  some cough , occ.  SOB, symptoms stable , not getting worse   neck mass-- no change per self exam     Current Medications (verified): 1)  Spiriva Handihaler 18 Mcg  Caps (Tiotropium Bromide Monohydrate) .Marland Kitchen.. 1 By Mouth Once Daily [phone] 2)  Ventolin Hfa 108 (90 Base) Mcg/act  Aers (Albuterol Sulfate) .... 2 Puffs As Needed Wheezing Evry 6 Hours 3)  Benazepril Hcl 20 Mg  Tabs (Benazepril Hcl) .Marland Kitchen.. 1 By Mouth Bid 4)  Toprol Xl 25 Mg Xr24h-Tab (Metoprolol Succinate) .... 1.5  By Mouth Once Daily 5)  Norvasc 10 Mg Tabs (Amlodipine Besylate) .Marland Kitchen.. 1 By Mouth Once Daily 6)  Glucophage 500 Mg  Tabs (Metformin Hcl) .... 2 By Mouth Once Daily 7)  Bayer Low Strength 81 Mg  Tbec (Aspirin) .... Take 1 Tablet By Mouth Once A Day 8)  Crestor 10 Mg Tabs (Rosuvastatin Calcium) .... Take 1/2 Tablet By Mouth Once A Day 9)  Protonix 40 Mg Tbec (Pantoprazole Sodium) .Marland Kitchen.. 1 By Mouth Once Daily [phone] 10)  Claritin 10 Mg Tabs (Loratadine) .... Take 1 Tablet By Mouth Once A Day 11)  Centrum Silver   Tabs (Multiple Vitamins-Minerals) .... Take 1 Tablet By Mouth Once A Day 12)  Mucinex Dm 30-600 Mg  Tb12 (Dextromethorphan-Guaifenesin) .... Take 1 Tablet By Mouth Once A Day 13)  Colace 100 Mg  Caps (Docusate Sodium) .... Take 1-2 Tablets By Mouth As Needed 14)  Benefiber   Powd (Wheat Dextrin) .... Mix One Tablespoon in Cereal in Morning 15)  Celebrex 100 Mg Caps (Celecoxib) .... One By Mouth Twice A Day As Needed For Pain. Take It With Food 16)  Calicum/vitamin D  Allergies (verified): No Known Drug Allergies  Past History:  Past Medical History: HYPERTENSION   AODM   HYPERLIPIDEMIA  DEPRESSION   COPD and  ASTHMA  GERD  and  HH  WITH REFLUX   COLONIC POLYPS  Allergic rhinitis  Past Surgical History: Reviewed history from 08/21/2008 and no changes required. Appendectomy Cataract extraction-bilateral Cholecystectomy Tubal ligation Tonsillectomy Hysterectomy Bilateral Breast Tumor  Social History: daughter lives w/her, has another daughter widow (72) Has lost 3 sons sister w/ dementia, in hospice  Retired Patient is a former smoker. Quit 1994 Alcohol Use - no Illicit Drug Use - no Patient does not get regular exercise.   Review of Systems CV:  Denies chest pain or discomfort and swelling of feet. GI:  Denies bloody stools, diarrhea, nausea, and vomiting; no epigastric pain or heartburn . MS:  still L knee pain, celebrex does help, takes it as needed . Psych:  distress due to sister's condition, has dementia. Has a hard time sleeping however declines any medication at this point. Heme:  was Rx iron, not taking d/t gi s/e , can't explain well what are the s/e .  Physical Exam  General:  alert and well-developed.   Neck:  no thyromegaly.   under the left side of the jaw, has a 1.5 inch mass, soft, nontender, not  hard and not attached deeper structures Lungs:  normal respiratory effort, no intercostal retractions, no accessory muscle use, and decreased  breath sounds but clear  Heart:  normal rate, regular rhythm, no murmur, and no gallop.   Extremities:  no LE  check edema   Psych:  Oriented X3, memory intact for recent and remote, normally interactive, good eye contact, not anxious appearing, and not depressed appearing.      Impression & Recommendations:  Problem # 1:  NECK MASS (ICD-784.2) unchanged, will recheck  from time to time  Problem # 2:  ANEMIA, IRON DEFICIENCY (ICD-280.9) had a colonoscopy last year, GI review of systems negative intolerant to iron, change formulation to POLY-IRON 150 FORTE   Check hemoglobin  Her updated medication list for this problem includes:    Poly-iron 150 Forte 150-25-1 Mg-mcg-mg Caps (Iron polysacch cmplx-b12-fa) .Marland Kitchen... 1 by mouth once daily  Orders: TLB-Hemoglobin (Hgb) (85018-HGB) Specimen Handling (99000)  Problem # 3:  HYPERTENSION (ICD-401.9) at goal  Her updated medication list for this problem includes:    Benazepril Hcl 20 Mg Tabs (Benazepril hcl) .Marland Kitchen... 1 by mouth bid    Toprol Xl 25 Mg Xr24h-tab (Metoprolol succinate) .Marland Kitchen... 1.5  by mouth once daily    Norvasc 10 Mg Tabs (Amlodipine besylate) .Marland Kitchen... 1 by mouth once daily  BP today: 118/82 Prior BP: 140/70 (07/08/2009)  Labs Reviewed: K+: 4.9 (03/10/2009) Creat: : 1.0 (03/10/2009)   Chol: 129 (07/08/2009)   HDL: 35.90 (07/08/2009)   LDL: 65 (07/08/2009)   TG: 143.0 (07/08/2009)  Orders: Venipuncture [id number]) TLB-BMP (Basic Metabolic Panel-BMET) (99991111) Specimen Handling (99000)  Problem # 4:  AODM (ICD-250.00) labs Her updated medication list for this problem includes:    Benazepril Hcl 20 Mg Tabs (Benazepril hcl) .Marland Kitchen... 1 by mouth bid    Glucophage 500 Mg Tabs (Metformin hcl) .Marland Kitchen... 2 by mouth once daily    Bayer Low Strength 81 Mg Tbec (Aspirin) .Marland Kitchen... Take 1 tablet by mouth once a day  Labs Reviewed: Creat: 1.0 (03/10/2009)     Last Eye Exam: normal (04/17/2009) Reviewed HgBA1c results: 6.7 (07/08/2009)  6.6 (03/10/2009)  Orders: TLB-A1C / Hgb A1C (Glycohemoglobin) (83036-A1C) Specimen Handling (99000)  Problem # 5:  DEPRESSION (ICD-311) counseled , sister in hospice, patient declines any medication at this point  Problem # 6:  COPD (ICD-496) symptoms stable, flu shot Her  updated medication list for this problem includes:    Spiriva Handihaler 18 Mcg Caps (Tiotropium bromide monohydrate) .Marland Kitchen... 1 by mouth once daily [phone]    Ventolin Hfa 108 (90 Base) Mcg/act Aers (Albuterol sulfate) .Marland Kitchen... 2 puffs as needed wheezing evry 6 hours  Complete Medication List: 1)  Spiriva Handihaler 18 Mcg Caps (Tiotropium bromide monohydrate) .Marland Kitchen.. 1 by mouth once daily [phone] 2)  Ventolin Hfa 108 (90 Base) Mcg/act Aers (Albuterol sulfate) .... 2 puffs as needed wheezing evry 6 hours 3)  Benazepril Hcl 20 Mg Tabs (Benazepril hcl) .Marland Kitchen.. 1 by mouth bid 4)  Toprol Xl 25 Mg Xr24h-tab (Metoprolol succinate) .... 1.5  by mouth once daily 5)  Norvasc 10 Mg Tabs (Amlodipine besylate) .Marland Kitchen.. 1 by mouth once daily 6)  Glucophage 500 Mg Tabs (Metformin hcl) .... 2 by mouth  once daily 7)  Bayer Low Strength 81 Mg Tbec (Aspirin) .... Take 1 tablet by mouth once a day 8)  Crestor 10 Mg Tabs (Rosuvastatin calcium) .... Take 1/2 tablet by mouth once a day 9)  Protonix 40 Mg Tbec (Pantoprazole sodium) .Marland Kitchen.. 1 by mouth once daily [phone] 10)  Claritin 10 Mg Tabs (Loratadine) .... Take 1 tablet by mouth once a day 11)  Centrum Silver Tabs (Multiple vitamins-minerals) .... Take 1 tablet by mouth once a day 12)  Mucinex Dm 30-600 Mg Tb12 (Dextromethorphan-guaifenesin) .... Take 1 tablet by mouth once a day 13)  Colace 100 Mg Caps (Docusate sodium) .... Take 1-2 tablets by mouth as needed 14)  Benefiber Powd (Wheat dextrin) .... Mix one tablespoon in cereal in morning 15)  Celebrex 100 Mg Caps (Celecoxib) .... One by mouth twice a day as needed for pain. take it with food 16)  Poly-iron 150 Forte 150-25-1 Mg-mcg-mg Caps (Iron polysacch cmplx-b12-fa) .Marland Kitchen.. 1 by mouth once daily 17)  Calicum/vitamin D   Other Orders: Flu Vaccine 35yrs + MEDICARE PATIENTS [id number]) Administration Flu vaccine - MCR [id number])  Patient Instructions: 1)  Please schedule a follow-up appointment in 4 months .   Prescriptions: POLY-IRON 150 FORTE 150-25-1 MG-MCG-MG CAPS (IRON POLYSACCH CMPLX-B12-FA) 1 by mouth once daily  #30 x 6   Entered and Authorized by:   Jacqulyn Bath E. Paz MD   Signed by:   Alda Berthold. Paz MD on 11/07/2009   Method used:   Electronically to        [id number] (retail)       [place],   [id number]       Ph: [phone]       Fax: [phone]   RxID:   [id number]    Orders Added: 1)  Venipuncture [id number] 2)  TLB-A1C / Hgb A1C (Glycohemoglobin) [83036-A1C] 3)  TLB-BMP (Basic Metabolic Panel-BMET) 123456 4)  TLB-Hemoglobin (Hgb) [85018-HGB] 5)  Specimen Handling [99000] 6)  Flu Vaccine 88yrs + MEDICARE PATIENTS [Q2039] 7)  Administration Flu vaccine - MCR [id number] 8)  Est. Patient Level IV [id number] Flu Vaccine Consent Questions     Do you have a history of severe allergic reactions to this vaccine? no    Any prior history of allergic reactions to egg and/or gelatin? no    Do you have a sensitivity to the preservative Thimersol? no    Do you have a past history of Guillan-Barre Syndrome? no    Do you currently have an acute febrile illness? no    Have you ever had a severe reaction to latex? no    Vaccine information given and explained to patient? yes    Are you currently pregnant? no    Lot Number:[id number]   Exp Date:07/18/2010   Site Given  Right Deltoid IM 6)  Flu Vaccine 59yrs + MEDICARE PATIENTS [Q2039] 7)  Administration Flu vaccine - MCR [id number]     .[email or web address]

## 2010-03-10 ENCOUNTER — Other Ambulatory Visit: Payer: Self-pay | Admitting: Internal Medicine

## 2010-03-10 ENCOUNTER — Encounter: Payer: Self-pay | Admitting: Internal Medicine

## 2010-03-10 ENCOUNTER — Ambulatory Visit (INDEPENDENT_AMBULATORY_CARE_PROVIDER_SITE_OTHER): Payer: Medicare Other | Admitting: Internal Medicine

## 2010-03-10 DIAGNOSIS — I10 Essential (primary) hypertension: Secondary | ICD-10-CM

## 2010-03-10 DIAGNOSIS — D509 Iron deficiency anemia, unspecified: Secondary | ICD-10-CM

## 2010-03-10 DIAGNOSIS — E119 Type 2 diabetes mellitus without complications: Secondary | ICD-10-CM

## 2010-03-10 DIAGNOSIS — E785 Hyperlipidemia, unspecified: Secondary | ICD-10-CM

## 2010-03-10 LAB — MICROALBUMIN / CREATININE URINE RATIO
Creatinine,U: 356.3 mg/dL
Microalb, Ur: 47.7 mg/dL — ABNORMAL HIGH (ref 0.0–1.9)

## 2010-03-11 LAB — IBC PANEL: Iron: 35 ug/dL — ABNORMAL LOW (ref 42–145)

## 2010-03-11 LAB — AST: AST: 21 U/L (ref 0–37)

## 2010-03-12 ENCOUNTER — Telehealth: Payer: Self-pay | Admitting: Internal Medicine

## 2010-03-17 NOTE — Assessment & Plan Note (Signed)
Summary: 4 MONTH ROV/LCH   Vital Signs:  Patient profile:   77 year old female Height:      66.25 inches Weight:      204.13 pounds BMI:     32.82 Pulse rate:   80 / minute Pulse rhythm:   regular BP sitting:   128 / 84  (left arm) Cuff size:   regular  Vitals Entered By: Allyn Kenner CMA (March 10, 2010 9:44 AM) CC: 4 month f/u- fasting  Comments no complaints  [facility]   History of Present Illness:  ROV feels well lot her sister, she is sad of course but at the same  time her stress level has decreased at the last OV was Rx a different iron suplement  taken once daily, better tolerated  ROS good medication compliance w/ breathing meds, only has occasional cough no wheezing ambulatory BPs wnl no ambulatory CBGs , no symptoms c/w low sugars  Current Medications (verified): 1)  Spiriva Handihaler 18 Mcg  Caps (Tiotropium Bromide Monohydrate) .Marland Kitchen.. 1 By Mouth Once Daily [phone] 2)  Ventolin Hfa 108 (90 Base) Mcg/act  Aers (Albuterol Sulfate) .... 2 Puffs As Needed Wheezing Evry 6 Hours 3)  Benazepril Hcl 20 Mg  Tabs (Benazepril Hcl) .Marland Kitchen.. 1 By Mouth Bid 4)  Toprol Xl 25 Mg Xr24h-Tab (Metoprolol Succinate) .... 1.5  By Mouth Once Daily 5)  Norvasc 10 Mg Tabs (Amlodipine Besylate) .Marland Kitchen.. 1 By Mouth Once Daily 6)  Glucophage 500 Mg  Tabs (Metformin Hcl) .... 2 By Mouth Once Daily 7)  Bayer Low Strength 81 Mg  Tbec (Aspirin) .... Take 1 Tablet By Mouth Once A Day 8)  Crestor 10 Mg Tabs (Rosuvastatin Calcium) .... Take 1/2 Tablet By Mouth Once A Day 9)  Protonix 40 Mg Tbec (Pantoprazole Sodium) .Marland Kitchen.. 1 By Mouth Once Daily [phone] 10)  Claritin 10 Mg Tabs (Loratadine) .... Take 1 Tablet By Mouth Once A Day 11)  Centrum Silver   Tabs (Multiple Vitamins-Minerals) .... Take 1 Tablet By Mouth Once A Day 12)  Mucinex Dm 30-600 Mg  Tb12 (Dextromethorphan-Guaifenesin) .... Take 1 Tablet By Mouth Once A Day 13)  Colace 100 Mg  Caps (Docusate Sodium) .... Take 1-2 Tablets By  Mouth As Needed 14)  Benefiber  Powd (Wheat Dextrin) .... Mix One Tablespoon in Cereal in Morning 15)  Celebrex 100 Mg Caps (Celecoxib) .... One By Mouth Twice A Day As Needed For Pain. Take It With Food 16)  Poly-Iron 150 Forte [id number] Mg-Mcg-Mg Caps (Iron Polysacch Cmplx-B12-Fa) .Marland Kitchen.. 1 By Mouth Once Daily 17)  Calicum/vitamin D  Allergies (verified): No Known Drug Allergies  Past History:  Past Medical History: HYPERTENSION   AODM   HYPERLIPIDEMIA  DEPRESSION   COPD and  ASTHMA  GERD  and  HH  WITH REFLUX   COLONIC POLYPS  Allergic rhinitis permanent parking signed 2-1 d/t age and multipole comorbidities   Past Surgical History: Reviewed history from 08/21/2008 and no changes required. Appendectomy Cataract extraction-bilateral Cholecystectomy Tubal ligation Tonsillectomy Hysterectomy Bilateral Breast Tumor  Social History: daughter lives w/her, has another daughter widow (65) Has lost 3 sons sister w/ dementia, passed away late 25-Apr-2009 Retired Patient is a former smoker. Quit April 25, 1992 Alcohol Use - no Illicit Drug Use - no Patient does not get regular exercise.   Physical Exam  General:  alert and well-developed.   Lungs:  normal respiratory effort, no intercostal retractions, no accessory muscle use, and decreased  breath sounds but clear  Heart:  normal rate, regular rhythm, no murmur, and no gallop.   Extremities:  no LE  edema  Psych:  Oriented X3, memory intact for recent and remote, normally interactive, good eye contact, not anxious appearing, and not depressed appearing.     Impression & Recommendations:  Problem # 1:  ANEMIA, IRON DEFICIENCY (ICD-280.9) tolerates iron  better, labs, consider d/c iron suplements  Her updated medication list for this problem includes:    Poly-iron 150 Forte 150-25-1 Mg-mcg-mg Caps (Iron polysacch cmplx-b12-fa) .Marland Kitchen... 1 by mouth once daily  Orders: Venipuncture [id number]) TLB-IBC Pnl (Iron/FE;Transferrin)  (83550-IBC) TLB-Hemoglobin (Hgb) (85018-HGB) Specimen Handling (99000)  Problem # 2:  HYPERTENSION (ICD-401.9) at goal  Her updated medication list for this problem includes:    Benazepril Hcl 20 Mg Tabs (Benazepril hcl) .Marland Kitchen... 1 by mouth bid    Toprol Xl 25 Mg Xr24h-tab (Metoprolol succinate) .Marland Kitchen... 1.5  by mouth once daily    Norvasc 10 Mg Tabs (Amlodipine besylate) .Marland Kitchen... 1 by mouth once daily  BP today: 128/84 Prior BP: 118/82 (11/07/2009)  Labs Reviewed: K+: 4.7 (11/07/2009) Creat: : 0.9 (11/07/2009)   Chol: 129 (07/08/2009)   HDL: 35.90 (07/08/2009)   LDL: 65 (07/08/2009)   TG: 143.0 (07/08/2009)  Problem # 3:  AODM (ICD-250.00) labs  Her updated medication list for this problem includes:    Benazepril Hcl 20 Mg Tabs (Benazepril hcl) .Marland Kitchen... 1 by mouth bid    Glucophage 500 Mg Tabs (Metformin hcl) .Marland Kitchen... 2 by mouth once daily    Bayer Low Strength 81 Mg Tbec (Aspirin) .Marland Kitchen... Take 1 tablet by mouth once a day  Orders: TLB-A1C / Hgb A1C (Glycohemoglobin) (83036-A1C) TLB-Microalbumin/Creat Ratio, Urine (82043-MALB) Specimen Handling (99000)  Labs Reviewed: Creat: 0.9 (11/07/2009)     Last Eye Exam: normal (04/17/2009) Reviewed HgBA1c results: 6.6 (11/07/2009)  6.7 (07/08/2009)  Problem # 4:  HYPERLIPIDEMIA (ICD-272.4)  Her updated medication list for this problem includes:    Crestor 10 Mg Tabs (Rosuvastatin calcium) .Marland Kitchen... Take 1/2 tablet by mouth once a day  Orders: TLB-ALT (SGPT) (84460-ALT) TLB-AST (SGOT) (84450-SGOT)  Labs Reviewed: SGOT: 35 (07/08/2009)   SGPT: 25 (07/08/2009)   HDL:35.90 (07/08/2009), 34.70 (11/07/2008)  LDL:65 (07/08/2009), 73 (11/07/2008)  Chol:129 (07/08/2009), 129 (11/07/2008)  Trig:143.0 (07/08/2009), 105.0 (11/07/2008)  Problem # 5:  DEPRESSION (ICD-311) lost sister but doing ok, counseled   Complete Medication List: 1)  Spiriva Handihaler 18 Mcg Caps (Tiotropium bromide monohydrate) .Marland Kitchen.. 1 by mouth once daily [phone] 2)  Ventolin Hfa  108 (90 Base) Mcg/act Aers (Albuterol sulfate) .... 2 puffs as needed wheezing evry 6 hours 3)  Mucinex Dm 30-600 Mg Tb12 (Dextromethorphan-guaifenesin) .... Take 1 tablet by mouth once a day 4)  Benazepril Hcl 20 Mg Tabs (Benazepril hcl) .Marland Kitchen.. 1 by mouth bid 5)  Toprol Xl 25 Mg Xr24h-tab (Metoprolol succinate) .... 1.5  by mouth once daily 6)  Norvasc 10 Mg Tabs (Amlodipine besylate) .Marland Kitchen.. 1 by mouth once daily 7)  Glucophage 500 Mg Tabs (Metformin hcl) .... 2 by mouth once daily 8)  Crestor 10 Mg Tabs (Rosuvastatin calcium) .... Take 1/2 tablet by mouth once a day 9)  Protonix 40 Mg Tbec (Pantoprazole sodium) .Marland Kitchen.. 1 by mouth once daily [phone] 10)  Claritin 10 Mg Tabs (Loratadine) .... Take 1 tablet by mouth once a day 11)  Bayer Low Strength 81 Mg Tbec (Aspirin) .... Take 1 tablet by mouth once a day 12)  Centrum Silver Tabs (Multiple vitamins-minerals) .... Take 1 tablet by mouth once a  day 13)  Colace 100 Mg Caps (Docusate sodium) .... Take 1-2 tablets by mouth as needed 14)  Benefiber Powd (Wheat dextrin) .... Mix one tablespoon in cereal in morning 15)  Celebrex 100 Mg Caps (Celecoxib) .... One by mouth twice a day as needed for pain. take it with food 16)  Poly-iron 150 Forte 150-25-1 Mg-mcg-mg Caps (Iron polysacch cmplx-b12-fa) .Marland Kitchen.. 1 by mouth once daily 17)  Calicum/vitamin D   Patient Instructions: 1)  Please schedule a follow-up appointment in 4 months . fasting , physical exam   Orders Added: 1)  Venipuncture [id number] 2)  TLB-IBC Pnl (Iron/FE;Transferrin) [83550-IBC] 3)  TLB-ALT (SGPT) [84460-ALT] 4)  TLB-AST (SGOT) [84450-SGOT] 5)  TLB-A1C / Hgb A1C (Glycohemoglobin) [83036-A1C] 6)  TLB-Microalbumin/Creat Ratio, Urine [82043-MALB] 7)  TLB-Hemoglobin (Hgb) [85018-HGB] 8)  Specimen Handling [99000] 9)  Est. Patient Level III [id number]

## 2010-03-17 NOTE — Progress Notes (Signed)
Summary: LABS  ---- Converted from flag ---- ---- 03/11/2010 6:38 PM, Baraka Klatt E. Jasslyn Finkel MD wrote: she continue with low iron, please ask patient if she is still having rectal bleeding, how often? ------------------------------ I spoke w/ pt and she states she is still having rectal bleeding she said normally its once every 2 months. [place]  March 12, 2010 10:09 AM   advised patient Diabetes stable Hemoglobin  stable, she still have some iron deficiency. Increase iron supplements to twice a day. Followup as planned. If she continue with rectal bleeding, that will have to be reassessed. Sayid Moll E. Patryck Kilgore MD  March 13, 2010 1:05 PM   I spoke w/ pt she is aware. Allyn Kenner CMA  March 13, 2010 1:09 PM       New/Updated Medications: POLY-IRON 150 FORTE 150-25-1 MG-MCG-MG CAPS (IRON POLYSACCH CMPLX-B12-FA) two times a day

## 2010-04-07 ENCOUNTER — Other Ambulatory Visit: Payer: Self-pay | Admitting: Internal Medicine

## 2010-04-07 DIAGNOSIS — Z1231 Encounter for screening mammogram for malignant neoplasm of breast: Secondary | ICD-10-CM

## 2010-04-15 ENCOUNTER — Other Ambulatory Visit: Payer: Self-pay | Admitting: Internal Medicine

## 2010-04-15 MED ORDER — METOPROLOL SUCCINATE ER 25 MG PO TB24: ORAL_TABLET | ORAL | Status: DC

## 2010-04-17 ENCOUNTER — Other Ambulatory Visit: Payer: Self-pay | Admitting: Internal Medicine

## 2010-04-17 MED ORDER — METOPROLOL SUCCINATE ER 25 MG PO TB24: ORAL_TABLET | ORAL | Status: DC

## 2010-04-17 NOTE — Telephone Encounter (Signed)
Addended by: Allyn Kenner on: 04/17/2010 03:07 PM   Modules accepted: Orders

## 2010-04-17 NOTE — Telephone Encounter (Signed)
Addended by: Allyn Kenner on: 04/17/2010 03:09 PM   Modules accepted: Orders

## 2010-04-22 ENCOUNTER — Other Ambulatory Visit: Payer: Self-pay | Admitting: *Deleted

## 2010-04-22 ENCOUNTER — Telehealth: Payer: Self-pay | Admitting: Internal Medicine

## 2010-04-22 MED ORDER — METOPROLOL SUCCINATE ER 25 MG PO TB24: ORAL_TABLET | ORAL | Status: DC

## 2010-04-22 NOTE — Telephone Encounter (Signed)
Faxed to pharm 

## 2010-04-22 NOTE — Telephone Encounter (Signed)
Patient has a new mail order pharmacy - [facility] - she need new rx for  toprol -

## 2010-04-29 ENCOUNTER — Ambulatory Visit
Admission: RE | Admit: 2010-04-29 | Discharge: 2010-04-29 | Disposition: A | Discharge status: Home / Self Care | Payer: Medicare Other | Source: Ambulatory Visit | Attending: Internal Medicine | Admitting: Internal Medicine

## 2010-04-29 DIAGNOSIS — Z1231 Encounter for screening mammogram for malignant neoplasm of breast: Secondary | ICD-10-CM

## 2010-05-19 ENCOUNTER — Telehealth: Payer: Self-pay | Admitting: Internal Medicine

## 2010-05-19 MED ORDER — PANTOPRAZOLE SODIUM 40 MG PO TBEC: 40.0000 mg | DELAYED_RELEASE_TABLET | Freq: Every day | ORAL | Status: DC

## 2010-05-19 MED ORDER — AMLODIPINE BESYLATE 10 MG PO TABS: 10.0000 mg | ORAL_TABLET | Freq: Every day | ORAL | Status: DC

## 2010-05-19 NOTE — Telephone Encounter (Signed)
patient wants refill for  norvasc & protonic  (generic) 30 day supply  - [facility] -

## 2010-06-03 ENCOUNTER — Other Ambulatory Visit: Payer: Self-pay | Admitting: *Deleted

## 2010-06-03 MED ORDER — PANTOPRAZOLE SODIUM 40 MG PO TBEC: 40.0000 mg | DELAYED_RELEASE_TABLET | Freq: Every day | ORAL | Status: DC

## 2010-06-03 MED ORDER — AMLODIPINE BESYLATE 10 MG PO TABS: 10.0000 mg | ORAL_TABLET | Freq: Every day | ORAL | Status: DC

## 2010-06-03 NOTE — Telephone Encounter (Signed)
Needs refills sent to [facility].

## 2010-06-04 ENCOUNTER — Other Ambulatory Visit: Payer: Self-pay | Admitting: *Deleted

## 2010-06-04 MED ORDER — BENAZEPRIL HCL 20 MG PO TABS: 20.0000 mg | ORAL_TABLET | Freq: Two times a day (BID) | ORAL | Status: DC

## 2010-06-29 ENCOUNTER — Other Ambulatory Visit: Payer: Self-pay | Admitting: Internal Medicine

## 2010-06-29 MED ORDER — METFORMIN HCL 500 MG PO TABS: 1000.0000 mg | ORAL_TABLET | Freq: Every day | ORAL | Status: DC

## 2010-06-29 MED ORDER — ROSUVASTATIN CALCIUM 10 MG PO TABS: 5.0000 mg | ORAL_TABLET | Freq: Every day | ORAL | Status: DC

## 2010-06-29 MED ORDER — TIOTROPIUM BROMIDE MONOHYDRATE 18 MCG IN CAPS: 18.0000 ug | ORAL_CAPSULE | Freq: Every day | RESPIRATORY_TRACT | Status: DC

## 2010-06-29 NOTE — Telephone Encounter (Signed)
Sent in

## 2010-07-06 ENCOUNTER — Encounter: Payer: Self-pay | Admitting: Internal Medicine

## 2010-07-10 ENCOUNTER — Ambulatory Visit (INDEPENDENT_AMBULATORY_CARE_PROVIDER_SITE_OTHER): Payer: Medicare Other | Admitting: Internal Medicine

## 2010-07-10 ENCOUNTER — Telehealth: Payer: Self-pay | Admitting: *Deleted

## 2010-07-10 ENCOUNTER — Encounter: Payer: Self-pay | Admitting: Internal Medicine

## 2010-07-10 DIAGNOSIS — R220 Localized swelling, mass and lump, head: Secondary | ICD-10-CM

## 2010-07-10 DIAGNOSIS — Z23 Encounter for immunization: Secondary | ICD-10-CM

## 2010-07-10 DIAGNOSIS — E785 Hyperlipidemia, unspecified: Secondary | ICD-10-CM

## 2010-07-10 DIAGNOSIS — Z0000 Encounter for general adult medical examination without abnormal findings: Secondary | ICD-10-CM

## 2010-07-10 DIAGNOSIS — E119 Type 2 diabetes mellitus without complications: Secondary | ICD-10-CM

## 2010-07-10 DIAGNOSIS — D509 Iron deficiency anemia, unspecified: Secondary | ICD-10-CM

## 2010-07-10 DIAGNOSIS — M1990 Unspecified osteoarthritis, unspecified site: Secondary | ICD-10-CM

## 2010-07-10 DIAGNOSIS — H9190 Unspecified hearing loss, unspecified ear: Secondary | ICD-10-CM

## 2010-07-10 DIAGNOSIS — E538 Deficiency of other specified B group vitamins: Secondary | ICD-10-CM

## 2010-07-10 DIAGNOSIS — I10 Essential (primary) hypertension: Secondary | ICD-10-CM

## 2010-07-10 LAB — CBC WITH DIFFERENTIAL/PLATELET
Basophils Relative: 0.9 % (ref 0.0–3.0)
Eosinophils Relative: 2.3 % (ref 0.0–5.0)
HCT: 41.6 % (ref 36.0–46.0)
Hemoglobin: 13.6 g/dL (ref 12.0–15.0)
Lymphs Abs: 2.1 10*3/uL (ref 0.7–4.0)
MCV: 82.5 fl (ref 78.0–100.0)
Monocytes Absolute: 0.7 10*3/uL (ref 0.1–1.0)
Monocytes Relative: 8.1 % (ref 3.0–12.0)
Platelets: 297 10*3/uL (ref 150.0–400.0)
RBC: 5.04 Mil/uL (ref 3.87–5.11)
WBC: 8.5 10*3/uL (ref 4.5–10.5)

## 2010-07-10 LAB — LIPID PANEL: Cholesterol: 137 mg/dL (ref 0–200)

## 2010-07-10 LAB — BASIC METABOLIC PANEL
BUN: 15 mg/dL (ref 6–23)
Calcium: 9.1 mg/dL (ref 8.4–10.5)
Creatinine, Ser: 0.9 mg/dL (ref 0.4–1.2)
GFR: 64.47 mL/min (ref >60.00)
Glucose, Bld: 120 mg/dL — ABNORMAL HIGH (ref 70–99)
Potassium: 4.5 mEq/L (ref 3.5–5.1)

## 2010-07-10 LAB — HEMOGLOBIN A1C: Hgb A1c MFr Bld: 6.9 % — ABNORMAL HIGH (ref 4.6–6.5)

## 2010-07-10 LAB — IRON: Iron: 43 ug/dL (ref 42–145)

## 2010-07-10 LAB — FOLATE: Folate: >24.8 ng/mL (ref >5.9)

## 2010-07-10 LAB — VITAMIN B12: Vitamin B-12: 667 pg/mL (ref 211–911)

## 2010-07-10 MED ORDER — FERROUS FUM-IRON POLYSACCH 162-115.2 MG PO CAPS: 1.0000 | ORAL_CAPSULE | Freq: Every day | ORAL | Status: DC

## 2010-07-10 MED ORDER — CELECOXIB 100 MG PO CAPS: 100.0000 mg | ORAL_CAPSULE | Freq: Two times a day (BID) | ORAL | Status: DC | PRN

## 2010-07-10 NOTE — Telephone Encounter (Signed)
Message copied by Charlies Silvers on Fri Jul 10, 2010  1:07 PM ------      Message from: Colon Branch      Created: Fri Jul 10, 2010 12:39 PM       Please arrange a referal to an audiologist      Dx decreased hearing

## 2010-07-10 NOTE — Telephone Encounter (Signed)
ERROR

## 2010-07-10 NOTE — Assessment & Plan Note (Signed)
Due for labs

## 2010-07-10 NOTE — Assessment & Plan Note (Signed)
At goal.  

## 2010-07-10 NOTE — Progress Notes (Signed)
Subjective:    Patient ID: Rebecca Elliott, female    DOB: 09/27/1926, 77 y.o.   MRN: [id number]  HPI  Here for Medicare AWV:  1. Risk factors based on Past M, S, F history: reviewed 2. Physical Activities: active, home chores, no yard work, no routine walking   3. Depression/mood:  No problems reported or noted 4. Hearing:  occ problems w/ hearing, refer to audiologist 5. ADL's:  Independent  6. Fall Risk: no recent falls  7. home Safety: does feel safe at home  8. Height, weight, &visual acuity: see VS, good vision, sees eye doctor routinely  9. Counseling: provided 10. Labs ordered based on risk factors: if needed  11. Referral Coordination: if needed 12.  Care Plan, see assessment and plan  13.   Cognitive Assessment:motor skill and cognition appropriate for age  In addition, today we discussed the following: Anemia-- sinle episode of rectal bleed few months ago, on Tandem once a day, it  used to cause nausea but now tolerates well Not on Ca and vit D DJD-- rarely takes celebrex, needs a Rx  DM-- good med compliance, no amb CBGs HTN-- good med compliance w/ meds . Good amb BPs  Neck mass- increased size? No pain  Past Medical History  Diagnosis Date  . Hypertension   . Diabetes mellitus     as an adult  . Hyperlipidemia   . Depression   . COPD (chronic obstructive pulmonary disease)   . Asthma   . GERD (gastroesophageal reflux disease)     and HH w/ reflux  . History of colonic polyps   . Allergic rhinitis     Past Surgical History  Procedure Date  . Appendectomy   . Cataract extraction, bilateral   . Cholecystectomy   . Tubal ligation   . Tonsillectomy   . Abdominal hysterectomy   . Bilateral breast tumor      Family History: breast cancer: sister (x2?) colon cancer : no DM: F, sister Heart attacks : brother (early 60s), Son Family History of Pancreatic Cancer: Son Family History of Stomach Cancer: Sister  Social History: widow (1994) Has lost 3  sons Retired Patient is a former smoker. Quit 1994 Alcohol Use - no Illicit Drug Use - no   Review of Systems  Respiratory: Negative for cough and shortness of breath.   Cardiovascular: Negative for chest pain and leg swelling.  Gastrointestinal: Negative for abdominal pain.  Genitourinary: Negative for dysuria, hematuria, vaginal bleeding and difficulty urinating.       Objective:   Physical Exam  Constitutional: She is oriented to person, place, and time. She appears well-developed and well-nourished. No distress.  HENT:  Head: Normocephalic and atraumatic.  Neck: No thyromegaly present.       Soft 5x4 cm mass at the L submandibular area, no red , no tender   Cardiovascular: Normal rate, regular rhythm and normal heart sounds.   No murmur heard. Pulmonary/Chest: Effort normal and breath sounds normal. No respiratory distress. She has no wheezes. She has no rales.  Abdominal: Soft. She exhibits no distension. There is no tenderness. There is no rebound and no guarding.  Genitourinary:       Normal breast exam B, no axillary LADs  Musculoskeletal: She exhibits no edema.  Lymphadenopathy:    She has no cervical adenopathy.  Neurological: She is alert and oriented to person, place, and time.  Skin: She is not diaphoretic.  Psychiatric: She has a normal mood and affect.  Her behavior is normal. Judgment and thought content normal.          Assessment & Plan:

## 2010-07-10 NOTE — Assessment & Plan Note (Signed)
Td 2002 pneumonia shot 2010 shingles shot--2011  breast exam today is neg MMG was neg 4-12 no further Paps  Cscope 2004 and 08-2008  : tics, hemorrhoids  had rectal bleeding and anemia, colonoscopyt 08/2008 : tics, no polyps  Last bone density test  06-2009 --- normal Diet-exercise discussed

## 2010-07-10 NOTE — Assessment & Plan Note (Signed)
RF  Celebrex, to be taken prn only

## 2010-07-10 NOTE — Assessment & Plan Note (Addendum)
Soft neck mass, increase size? See overview, will send back to  Dr Wilburn Cornelia for reassessment,  although suspect he  will rec observation

## 2010-07-10 NOTE — Patient Instructions (Signed)
Oscal D (has calcium and vit D on it) 1 or 2 aday

## 2010-07-10 NOTE — Assessment & Plan Note (Addendum)
H/o iron def w/ normal Hg, see overview  update on Cscopes on Tandem 1 qd Labs, d/c supplements once iron -ferritin wnl If persistent or severe anemia: back to GI?, EGD? Also r/o B12 def.-----> labs

## 2010-07-10 NOTE — Assessment & Plan Note (Signed)
Labs

## 2010-07-13 ENCOUNTER — Telehealth: Payer: Self-pay | Admitting: *Deleted

## 2010-07-13 NOTE — Telephone Encounter (Signed)
I spoke w/ pt she is aware.  

## 2010-07-13 NOTE — Telephone Encounter (Signed)
Addended by: Charlies Silvers on: 07/13/2010 04:18 PM   Modules accepted: Orders

## 2010-07-13 NOTE — Telephone Encounter (Signed)
Message copied by Charlies Silvers on Mon Jul 13, 2010  2:36 PM ------      Message from: Kathlene November E      Created: Mon Jul 13, 2010  1:07 PM       Advise patient:      Diabetes and cholesterol well controlled      Anemia resolved, Iron is  normal. Okay to discontinue iron supplements.      Good results!

## 2010-07-30 ENCOUNTER — Other Ambulatory Visit: Payer: Self-pay | Admitting: *Deleted

## 2010-07-30 MED ORDER — PANTOPRAZOLE SODIUM 40 MG PO TBEC: 40.0000 mg | DELAYED_RELEASE_TABLET | Freq: Every day | ORAL | Status: DC

## 2010-08-25 ENCOUNTER — Other Ambulatory Visit: Payer: Self-pay | Admitting: Internal Medicine

## 2010-08-25 MED ORDER — METFORMIN HCL 500 MG PO TABS: 1000.0000 mg | ORAL_TABLET | Freq: Every day | ORAL | Status: DC

## 2010-08-25 MED ORDER — ROSUVASTATIN CALCIUM 10 MG PO TABS: 5.0000 mg | ORAL_TABLET | Freq: Every day | ORAL | Status: AC

## 2010-08-25 MED ORDER — TIOTROPIUM BROMIDE MONOHYDRATE 18 MCG IN CAPS: 18.0000 ug | ORAL_CAPSULE | Freq: Every day | RESPIRATORY_TRACT | Status: DC

## 2010-08-25 NOTE — Telephone Encounter (Signed)
Rx[s] Done. 

## 2010-09-28 ENCOUNTER — Other Ambulatory Visit: Payer: Self-pay | Admitting: Internal Medicine

## 2010-09-29 NOTE — Telephone Encounter (Signed)
Rx Done . 

## 2010-11-09 ENCOUNTER — Ambulatory Visit: Payer: Medicare Other | Admitting: Internal Medicine

## 2010-11-20 ENCOUNTER — Other Ambulatory Visit: Payer: Self-pay | Admitting: Internal Medicine

## 2011-01-08 ENCOUNTER — Ambulatory Visit (INDEPENDENT_AMBULATORY_CARE_PROVIDER_SITE_OTHER): Payer: Medicare Other | Admitting: Internal Medicine

## 2011-01-08 ENCOUNTER — Encounter: Payer: Self-pay | Admitting: Internal Medicine

## 2011-01-08 VITALS — BP 140/62 | HR 67 | Temp 98.4°F | Ht 66.0 in | Wt 200.0 lb

## 2011-01-08 DIAGNOSIS — E119 Type 2 diabetes mellitus without complications: Secondary | ICD-10-CM

## 2011-01-08 DIAGNOSIS — J449 Chronic obstructive pulmonary disease, unspecified: Secondary | ICD-10-CM

## 2011-01-08 DIAGNOSIS — D509 Iron deficiency anemia, unspecified: Secondary | ICD-10-CM

## 2011-01-08 DIAGNOSIS — I10 Essential (primary) hypertension: Secondary | ICD-10-CM

## 2011-01-08 MED ORDER — LOSARTAN POTASSIUM-HCTZ 100-12.5 MG PO TABS: 1.0000 | ORAL_TABLET | Freq: Every day | ORAL | Status: DC

## 2011-01-08 NOTE — Assessment & Plan Note (Signed)
Trying to stay active and eat healthy. Good medication compliance. Will check A1c, see instructions

## 2011-01-08 NOTE — Progress Notes (Signed)
  Subjective:    Patient ID: Rebecca Elliott, female    DOB: September 19, 1926, 77 y.o.   MRN: [id number]  HPI Here for a routine visit. In general feels well.  Neck mass--went to see ENT, they recommend observation COPD--good medication compliance, uses albuterol once a week on average. Diabetes--good medication compliance, normal ambulatory blood sugars Hypertension--good medication compliance, ambulatory BPs usually in the 140-160. BP never down to the 120s. Still on iron, wonders if she needs to stop. (yes)  Past Medical History: HYPERTENSION   AODM   HYPERLIPIDEMIA  DEPRESSION   COPD and  ASTHMA  GERD  and  HH  WITH REFLUX   COLONIC POLYPS  Allergic rhinitis   Past Surgical History: Appendectomy Cataract extraction-bilateral Cholecystectomy Tubal ligation Tonsillectomy Hysterectomy Bilateral Breast Tumor  Review of Systems No chest pain, dyspnea on exertion at baseline No nausea, vomiting, diarrhea. Had a flu shot already    Objective:   Physical Exam  Constitutional: She is oriented to person, place, and time. She appears well-developed.  Cardiovascular: Normal rate, regular rhythm and normal heart sounds.   No murmur heard. Pulmonary/Chest: Effort normal and breath sounds normal. No respiratory distress. She has no wheezes. She has no rales.  Musculoskeletal: She exhibits no edema.  Neurological: She is alert and oriented to person, place, and time.  Psychiatric: She has a normal mood and affect. Her behavior is normal. Judgment and thought content normal.      Assessment & Plan:

## 2011-01-08 NOTE — Assessment & Plan Note (Signed)
Needs better control, BP between 140-160, goal 120. Discontinue Lotensin 20 mg twice a day, start losartan HCTZ, see instructions

## 2011-01-08 NOTE — Assessment & Plan Note (Signed)
Well controlled 

## 2011-01-08 NOTE — Assessment & Plan Note (Signed)
Resolved, discontinue iron

## 2011-01-08 NOTE — Patient Instructions (Signed)
Stop iron, stop Lotensin Start losartan HCTZ 1 tablet daily, hopefully your BP will go down to 120/80. If you have problems with the new medicine, let me know  Schedule a nurse visit in 3 weeks for: 1. BP check 2. blood work: A1c----- dx  diabetes, BMP --- dx hypertension

## 2011-01-28 ENCOUNTER — Other Ambulatory Visit: Payer: Self-pay | Admitting: Internal Medicine

## 2011-01-28 DIAGNOSIS — E119 Type 2 diabetes mellitus without complications: Secondary | ICD-10-CM

## 2011-01-28 DIAGNOSIS — E785 Hyperlipidemia, unspecified: Secondary | ICD-10-CM

## 2011-01-29 ENCOUNTER — Other Ambulatory Visit (INDEPENDENT_AMBULATORY_CARE_PROVIDER_SITE_OTHER): Payer: Medicare Other

## 2011-01-29 ENCOUNTER — Other Ambulatory Visit: Payer: Medicare Other

## 2011-01-29 ENCOUNTER — Ambulatory Visit (INDEPENDENT_AMBULATORY_CARE_PROVIDER_SITE_OTHER): Payer: Medicare Other | Admitting: Internal Medicine

## 2011-01-29 VITALS — BP 130/70 | HR 75 | Temp 97.7°F | Wt 197.0 lb

## 2011-01-29 DIAGNOSIS — I10 Essential (primary) hypertension: Secondary | ICD-10-CM | POA: Diagnosis not present

## 2011-01-29 DIAGNOSIS — E119 Type 2 diabetes mellitus without complications: Secondary | ICD-10-CM

## 2011-01-29 DIAGNOSIS — E785 Hyperlipidemia, unspecified: Secondary | ICD-10-CM | POA: Diagnosis not present

## 2011-01-29 LAB — HEMOGLOBIN A1C: Hgb A1c MFr Bld: 6.4 % (ref 4.6–6.5)

## 2011-01-29 NOTE — Patient Instructions (Signed)
Per Dr.Paz return in 10 days for BP check and BMP.  Pt is to stop Benazepril 20 mg and iron and continue to take Losartan- hydrochlorothiazide.

## 2011-02-05 ENCOUNTER — Other Ambulatory Visit: Payer: Self-pay | Admitting: Internal Medicine

## 2011-02-05 DIAGNOSIS — I10 Essential (primary) hypertension: Secondary | ICD-10-CM

## 2011-02-08 ENCOUNTER — Other Ambulatory Visit (INDEPENDENT_AMBULATORY_CARE_PROVIDER_SITE_OTHER): Payer: Medicare Other

## 2011-02-08 ENCOUNTER — Ambulatory Visit (INDEPENDENT_AMBULATORY_CARE_PROVIDER_SITE_OTHER): Payer: Medicare Other | Admitting: Internal Medicine

## 2011-02-08 VITALS — BP 138/70 | HR 68 | Temp 98.6°F | Wt 198.0 lb

## 2011-02-08 DIAGNOSIS — I10 Essential (primary) hypertension: Secondary | ICD-10-CM | POA: Diagnosis not present

## 2011-02-08 LAB — BASIC METABOLIC PANEL
BUN: 13 mg/dL (ref 6–23)
Creatinine, Ser: 0.9 mg/dL (ref 0.4–1.2)
GFR: 62.75 mL/min (ref >60.00)

## 2011-02-08 MED ORDER — LOSARTAN POTASSIUM-HCTZ 100-12.5 MG PO TABS: 1.0000 | ORAL_TABLET | Freq: Every day | ORAL | Status: DC

## 2011-02-08 NOTE — Progress Notes (Signed)
  Subjective:    Patient ID: Rebecca Elliott, female    DOB: [birth year], 78 y.o.   MRN: [id number]  HPI Here for a nurse visit   Review of Systems     Objective:   Physical Exam  BP satisfactory      Assessment & Plan:  See instructions

## 2011-02-08 NOTE — Patient Instructions (Addendum)
Monitor Blood Pressure at home, continue with Losartan and schedule follow-up appointment in 3 month to see Dr Larose Kells.  Check labs today.

## 2011-02-12 ENCOUNTER — Encounter: Payer: Self-pay | Admitting: *Deleted

## 2011-03-03 ENCOUNTER — Other Ambulatory Visit: Payer: Self-pay | Admitting: Internal Medicine

## 2011-03-03 NOTE — Telephone Encounter (Signed)
Refill done.  

## 2011-03-27 NOTE — Progress Notes (Signed)
  Subjective:    Patient ID: Rebecca Elliott, female    DOB: 1926/01/24, 78 y.o.   MRN: [id number]  HPI  Here for BP check  Review of Systems     Objective:   Physical Exam See VS       Assessment & Plan:

## 2011-03-27 NOTE — Assessment & Plan Note (Signed)
Here for BP check, see instructions

## 2011-04-05 ENCOUNTER — Other Ambulatory Visit: Payer: Self-pay | Admitting: Internal Medicine

## 2011-04-05 NOTE — Telephone Encounter (Signed)
Is pt still suppose to take benzepril 20mg ? It looks like there was a "change in therapy." Please advise if refill is ok.

## 2011-04-15 MED ORDER — LOSARTAN POTASSIUM-HCTZ 100-12.5 MG PO TABS: 1.0000 | ORAL_TABLET | Freq: Every day | ORAL | Status: DC

## 2011-04-15 NOTE — Telephone Encounter (Signed)
Ok amlodipine x 6 months No benazepril, she is now on hyzaar , ok to RF it if needed x 6 months

## 2011-04-15 NOTE — Telephone Encounter (Signed)
Addended by: Valentina Gu L on: [id number] 05:42 PM   Modules accepted: Orders

## 2011-04-21 ENCOUNTER — Encounter (HOSPITAL_BASED_OUTPATIENT_CLINIC_OR_DEPARTMENT_OTHER): Payer: Self-pay | Admitting: Emergency Medicine

## 2011-04-21 ENCOUNTER — Emergency Department (HOSPITAL_BASED_OUTPATIENT_CLINIC_OR_DEPARTMENT_OTHER)
Admission: EM | Admit: 2011-04-21 | Discharge: 2011-04-21 | Disposition: A | Discharge status: Home / Self Care | Payer: Medicare Other | Attending: Emergency Medicine | Admitting: Emergency Medicine

## 2011-04-21 DIAGNOSIS — I10 Essential (primary) hypertension: Secondary | ICD-10-CM | POA: Insufficient documentation

## 2011-04-21 DIAGNOSIS — S0101XA Laceration without foreign body of scalp, initial encounter: Secondary | ICD-10-CM

## 2011-04-21 DIAGNOSIS — W06XXXA Fall from bed, initial encounter: Secondary | ICD-10-CM | POA: Insufficient documentation

## 2011-04-21 DIAGNOSIS — S0100XA Unspecified open wound of scalp, initial encounter: Secondary | ICD-10-CM | POA: Insufficient documentation

## 2011-04-21 DIAGNOSIS — E119 Type 2 diabetes mellitus without complications: Secondary | ICD-10-CM | POA: Diagnosis not present

## 2011-04-21 DIAGNOSIS — T1490XA Injury, unspecified, initial encounter: Secondary | ICD-10-CM | POA: Diagnosis not present

## 2011-04-21 MED ORDER — LIDOCAINE-EPINEPHRINE 2 %-1:100000 IJ SOLN
20.0000 mL | Freq: Once | INTRAMUSCULAR | Status: AC
  Administered 2011-04-21: 20 mL

## 2011-04-21 MED ORDER — LIDOCAINE-EPINEPHRINE 2 %-1:100000 IJ SOLN
INTRAMUSCULAR | Status: AC
  Administered 2011-04-21: 20 mL
  Filled 2011-04-21: qty 1

## 2011-04-21 NOTE — ED Notes (Signed)
Patient ambulatory with stand by assistance.  Patient's gait is steady.

## 2011-04-21 NOTE — ED Notes (Signed)
Per ems pt stated she was asleep, rolled out of bed, hit head on dresser handle, 1-2cm laceration, no loc, vs stable, history of blurred vision, no changes to vision from fall, occurred 45-1hr ago

## 2011-04-21 NOTE — ED Provider Notes (Signed)
History     CSN: [id number]  Arrival date & time 04/21/11  [id number]   None     Chief Complaint  Patient presents with  . Head Injury    (Consider location/radiation/quality/duration/timing/severity/associated sxs/prior treatment) HPI  Pt presents via EMS today after rolling out of bed while asleep and lacerating the back of her head. She denies having any pain or injury other than the laceration. She is onl on ASA as a blood thinner. EMS reports about 100 cc of blood loss.  PCP Dr Larose Kells  Past Medical History  Diagnosis Date  . Hypertension   . Diabetes mellitus     as an adult  . Hyperlipidemia   . Depression   . COPD (chronic obstructive pulmonary disease)   . Asthma   . GERD (gastroesophageal reflux disease)     and HH w/ reflux  . History of colonic polyps   . Allergic rhinitis     Past Surgical History  Procedure Date  . Appendectomy   . Cataract extraction, bilateral   . Cholecystectomy   . Tubal ligation   . Tonsillectomy   . Abdominal hysterectomy   . Bilateral breast tumor     Family History  Problem Relation Age of Onset  . Breast cancer Sister     x 2  . Colon cancer Neg Hx   . Diabetes Father   . Diabetes Sister   . Heart attack Brother     early 52s  . Heart attack Son   . Pancreatic cancer Son   . Stomach cancer Sister     History  Substance Use Topics  . Smoking status: Former Research scientist (life sciences)  . Smokeless tobacco: Not on file  . Alcohol Use: No  lives with daughter Linus Mako unassisted  [place] History    Grav Para Term Preterm Abortions TAB SAB Ect Mult Living                 Last tetanus less than 10 years ago.   Review of Systems  All other systems reviewed and are negative.    Allergies  Review of patient's allergies indicates no known allergies.  Home Medications   Current Outpatient Rx  Name Route Sig Dispense Refill  . ALBUTEROL SULFATE HFA 108 (90 BASE) MCG/ACT IN AERS Inhalation Inhale 2 puffs into the lungs every 6 (six) hours as  needed.      Marland Kitchen AMLODIPINE BESYLATE 10 MG PO TABS  TAKE 1 TABLET DAILY 90 tablet 1  . ASPIRIN 81 MG PO TABS Oral Take 81 mg by mouth daily.      . CELECOXIB 100 MG PO CAPS Oral Take 1 capsule (100 mg total) by mouth 2 (two) times daily as needed. 180 capsule 1  . DM-GUAIFENESIN ER 30-600 MG PO TB12 Oral Take 1 tablet by mouth daily.      Marland Kitchen DOCUSATE SODIUM 100 MG PO CAPS  Take 1-2 by mouth once a day.     Marland Kitchen LORATADINE 10 MG PO TABS Oral Take 10 mg by mouth daily.      Marland Kitchen LOSARTAN POTASSIUM-HCTZ 100-12.5 MG PO TABS Oral Take 1 tablet by mouth daily. 90 tablet 1  . METFORMIN HCL 500 MG PO TABS Oral Take 2 tablets (1,000 mg total) by mouth daily with breakfast. 180 tablet 3  . METOPROLOL SUCCINATE ER 25 MG PO TB24  TAKE 1 AND 1/2 TABLETS ONCEA DAY 135 tablet 0  . CENTRUM SILVER PO CHEW Oral Chew by mouth.      Marland Kitchen  PANTOPRAZOLE SODIUM 40 MG PO TBEC Oral Take 1 tablet (40 mg total) by mouth daily. 90 tablet 1  . ROSUVASTATIN CALCIUM 10 MG PO TABS Oral Take 0.5 tablets (5 mg total) by mouth at bedtime. 45 tablet 3  . SPIRIVA HANDIHALER 18 MCG IN CAPS  INHALE THE CONTENTS OF 1   CAPSULE DAILY (PLACE       CAPSULE INTO INHALER) 90 capsule 0  . BENEFIBER DRINK MIX PO Oral Take by mouth 2 (two) times daily.        BP 144/92  Pulse 72  Resp 18  Vital signs normal    Physical Exam  Nursing note and vitals reviewed. Constitutional: She is oriented to person, place, and time. She appears well-developed and well-nourished.  Non-toxic appearance. She does not appear ill. No distress.  HENT:  Head: Normocephalic.  Right Ear: External ear normal.  Left Ear: External ear normal.  Nose: Nose normal. No mucosal edema or rhinorrhea.  Mouth/Throat: Oropharynx is clear and moist and mucous membranes are normal. No dental abscesses or uvula swelling.       Laceration noted on back of scalp with blood soaked hair.   Eyes: Conjunctivae and EOM are normal. Pupils are equal, round, and reactive to light.  Neck:  Normal range of motion and full passive range of motion without pain. Neck supple.  Cardiovascular: Normal rate, regular rhythm and normal heart sounds.  Exam reveals no gallop and no friction rub.   No murmur heard. Pulmonary/Chest: Effort normal and breath sounds normal. No respiratory distress. She has no wheezes. She has no rhonchi. She has no rales. She exhibits no tenderness and no crepitus.  Abdominal: Soft. Normal appearance and bowel sounds are normal. She exhibits no distension. There is no tenderness. There is no rebound and no guarding.  Musculoskeletal: Normal range of motion. She exhibits no edema and no tenderness.       Moves all extremities well.   Neurological: She is alert and oriented to person, place, and time. She has normal strength. No cranial nerve deficit.  Skin: Skin is warm, dry and intact. No rash noted. No erythema. No pallor.  Psychiatric: She has a normal mood and affect. Her speech is normal and behavior is normal. Her mood appears not anxious.    ED Course  Procedures (including critical care time)  LACERATION REPAIR Performed by: Veatrice Eckstein L Authorized by: Janice Norrie Consent: Verbal consent obtained. Risks and benefits: risks, benefits and alternatives were discussed Consent given by: patient Patient identity confirmed: provided demographic data Prepped and Draped in normal sterile fashion Wound explored  Laceration Location: posterior scalp   Laceration Length:  1 cm  No Foreign Bodies seen or palpated  Anesthesia: local infiltration  Local anesthetic: lidocaine 1% + 1 epinephrine  Anesthetic total: 5 ml  IAmount of cleaning: standard  Pt had active bleeding, unable to visualize the source, I clamped both sides of the laceration without stopping the bleeding. Deep sutures were placed as described above.   Skin closure: figure of eight x1, 2 interrupted sutures  Number of sutures: 3    Patient tolerance: Patient tolerated the  procedure well with no immediate complications.  Nurses have cleaned the blood out of her hair/scalp after sutures placed, no other laceration seen. She has mild swelling at the site c/w injection of her local anesthetic and the area isn't enlarging in time so I feel the bleeding has been controlled at this time.   Recheck 07:07 no swelling  under the scalp in the area of the laceration, no active bleeding.   1. Laceration of scalp   2. Fall from bed       MDM CT head not done, pt has had no LOC, she denies headache, or any injury other than her laceration.     Plan discharge Rolland Porter, MD, Alanson Aly, MD 04/21/11 [phone]

## 2011-04-21 NOTE — ED Notes (Signed)
Pt's BP checked three times. 108/59 in right arm, 88/50 in left arm, and 99/50 again in the right arm. EDP made aware of low BP's. Orders to administer PO fluids and allow rest. Will continue to reassess BP. Report given to [place] day shift. Pt is not symptomatic. Daughter at bedside.

## 2011-04-21 NOTE — ED Notes (Signed)
Arrived via ems, dressing removed,l head wound actively squirting blood, md at bedside

## 2011-04-21 NOTE — Discharge Instructions (Signed)
Ice packs to keep the swelling down. You can have tylenol for pain if needed. The sutures need to be removed in 7 days, you can see if Dr Ethel Rana office can do that for you. If you have any problems listed on the head injury sheet you should be taken to [facility] ED.

## 2011-04-21 NOTE — ED Notes (Signed)
Stitches applied to left lateral occipital area of head, neuro intact, pt denies pain, congealed blood cleaned from head, no other injuries noted, pt denies other injuries, tolerated procedure well

## 2011-04-23 ENCOUNTER — Telehealth: Payer: Self-pay

## 2011-04-23 NOTE — Telephone Encounter (Signed)
aller: [name]/Mother; PCP: Kathlene November; CB#: [phone]; ; ; Call regarding Stiches On [place]. Wants To Know When She Can Wash Her Hair;  Pt. had 3 stitches placed on top of head in ED 4-3 after fall. Wants to know when can wash hair. Thinks was told at ED 24 hours after procedure but unsure. Advised can wash hair but to dry afterward. Incision is not red/painful. Home care advice given.

## 2011-04-23 NOTE — Telephone Encounter (Signed)
I would await for 48-72 hours to wash the hair if there is any tenderness or redness. If there is  no swelling or redness after the 24 then gentle washing this time

## 2011-04-23 NOTE — Telephone Encounter (Signed)
pts [name]) left a message that patient got 3 stiches on [place] 3 in her head? Pts daughter would like to know when patient can wash her hair? Please advise? Call back # [phone]

## 2011-04-23 NOTE — Telephone Encounter (Signed)
Discussed with pt

## 2011-04-26 NOTE — Telephone Encounter (Signed)
Stitches need to be removed a week after the accident, schedule an appointment for stiches removal today if needed , overbook ok, should be a short visit , if has other issues we can remove stitches today and schedule another visit for later

## 2011-04-28 ENCOUNTER — Other Ambulatory Visit: Payer: Self-pay | Admitting: Internal Medicine

## 2011-04-28 ENCOUNTER — Ambulatory Visit (INDEPENDENT_AMBULATORY_CARE_PROVIDER_SITE_OTHER): Payer: Medicare Other | Admitting: Internal Medicine

## 2011-04-28 VITALS — BP 134/68 | HR 75 | Temp 97.8°F | Wt 206.0 lb

## 2011-04-28 DIAGNOSIS — Z4802 Encounter for removal of sutures: Secondary | ICD-10-CM

## 2011-04-28 NOTE — Progress Notes (Signed)
  Subjective:    Patient ID: Rebecca Elliott, female    DOB: 01-02-1927, 78 y.o.   MRN: [id number]  HPI Here for suture removal, reports no problems  Past Medical History:  HYPERTENSION  AODM  HYPERLIPIDEMIA  DEPRESSION  COPD and ASTHMA  GERD and HH WITH REFLUX  COLONIC POLYPS  Allergic rhinitis  Past Surgical History:  Appendectomy  Cataract extraction-bilateral  Cholecystectomy  Tubal ligation  Tonsillectomy  Hysterectomy  Bilateral Breast Tumor  Review of Systems No bleeding, redness, d/c or swelling reported     Objective:   Physical Exam  Small laceration in the scalp seems to be healing well, no redness or discharge.      Assessment & Plan:  3 stitches removed today.

## 2011-04-30 NOTE — Telephone Encounter (Signed)
Refill done.  

## 2011-05-10 ENCOUNTER — Ambulatory Visit (INDEPENDENT_AMBULATORY_CARE_PROVIDER_SITE_OTHER): Payer: Medicare Other | Admitting: Internal Medicine

## 2011-05-10 ENCOUNTER — Encounter: Payer: Self-pay | Admitting: Internal Medicine

## 2011-05-10 VITALS — BP 130/70 | HR 60 | Temp 97.5°F | Wt 205.0 lb

## 2011-05-10 DIAGNOSIS — I10 Essential (primary) hypertension: Secondary | ICD-10-CM | POA: Diagnosis not present

## 2011-05-10 DIAGNOSIS — J45909 Unspecified asthma, uncomplicated: Secondary | ICD-10-CM

## 2011-05-10 DIAGNOSIS — K219 Gastro-esophageal reflux disease without esophagitis: Secondary | ICD-10-CM | POA: Diagnosis not present

## 2011-05-10 DIAGNOSIS — E119 Type 2 diabetes mellitus without complications: Secondary | ICD-10-CM

## 2011-05-10 DIAGNOSIS — E785 Hyperlipidemia, unspecified: Secondary | ICD-10-CM | POA: Diagnosis not present

## 2011-05-10 LAB — BASIC METABOLIC PANEL
BUN: 16 mg/dL (ref 6–23)
Calcium: 9.1 mg/dL (ref 8.4–10.5)
GFR: 54.97 mL/min — ABNORMAL LOW (ref >60.00)
Glucose, Bld: 136 mg/dL — ABNORMAL HIGH (ref 70–99)

## 2011-05-10 LAB — ALT: ALT: 69 U/L — ABNORMAL HIGH (ref 0–35)

## 2011-05-10 NOTE — Progress Notes (Signed)
  Subjective:    Patient ID: Rebecca Elliott, female    DOB: [birth year], 78 y.o.   MRN: [id number]  HPI Routine office visit Diabetes, good medication compliance, no ambulatory but sugars Hypertension, good medication compliance, ambulatory blood pressures less than 130. COPD, in the last few weeks she has used ventolin approximately every other day, no more than 2 puffs daily. Thinks is using more ventolin d/t allergies  Past Medical History:  HYPERTENSION  AODM  HYPERLIPIDEMIA  DEPRESSION  COPD and ASTHMA  GERD and HH WITH REFLUX  COLONIC POLYPS  Allergic rhinitis  Past Surgical History:  Appendectomy  Cataract extraction-bilateral  Cholecystectomy  Tubal ligation  Tonsillectomy  Hysterectomy  Bilateral Breast Tumor  Review of Systems No fever or chills No chest pain or shortness or breath On Protonix, GERD symptoms well controlled overall  But she occasionally gets choked with dry foods like corn breath. Denies dysphasia or odynophagia.     Objective:   Physical Exam General -- alert, well-developed. No apparent distress.  Lungs -- normal respiratory effort, no intercostal retractions, no accessory muscle use, and normal breath sounds.   Heart-- normal rate, regular rhythm, no murmur, and no gallop.   Extremities-- no pretibial edema bilaterally  Neurologic-- alert & oriented X3 and strength normal in all extremities. Psych-- Cognition and judgment appear intact. Alert and cooperative with normal attention span and concentration.  not anxious appearing and not depressed appearing.       Assessment & Plan:

## 2011-05-10 NOTE — Assessment & Plan Note (Signed)
Symptoms well controlled, occasionally chokes on dry foods. Prevention discussed including small bites, chew food well, fluids w/ dry foods

## 2011-05-10 NOTE — Assessment & Plan Note (Addendum)
Slight increase ventolin use in the last few weeks to every other day. Related to allergies?. Symptoms usually well controlled, no change for now.

## 2011-05-10 NOTE — Assessment & Plan Note (Signed)
Well controlled, monitor her liver

## 2011-05-10 NOTE — Assessment & Plan Note (Signed)
Good medication compliance, labs

## 2011-05-10 NOTE — Assessment & Plan Note (Signed)
Well-controlled, no change 

## 2011-05-25 ENCOUNTER — Other Ambulatory Visit: Payer: Self-pay | Admitting: Internal Medicine

## 2011-05-25 NOTE — Telephone Encounter (Signed)
Is the pt suppose to be taking benazepril 20mg . I do not see it on current med list. Please advise. Ok to refill?

## 2011-05-31 ENCOUNTER — Other Ambulatory Visit (INDEPENDENT_AMBULATORY_CARE_PROVIDER_SITE_OTHER): Payer: Medicare Other

## 2011-05-31 DIAGNOSIS — R7401 Elevation of levels of liver transaminase levels: Secondary | ICD-10-CM

## 2011-05-31 DIAGNOSIS — R748 Abnormal levels of other serum enzymes: Secondary | ICD-10-CM

## 2011-05-31 NOTE — Progress Notes (Signed)
Labs only

## 2011-06-01 ENCOUNTER — Telehealth: Payer: Self-pay

## 2011-06-01 DIAGNOSIS — R7989 Other specified abnormal findings of blood chemistry: Secondary | ICD-10-CM

## 2011-06-01 NOTE — Telephone Encounter (Signed)
Message copied by Logan Bores on Tue Jun 01, 2011  4:35 PM ------      Message from: Kathlene November E      Created: Tue Jun 01, 2011  1:00 PM       Advise patient      LFTs improving but not completely well.       Plan: Hold Crestor, check AST ALT in 3 weeks-- dx increased LFTs ( no needs for office visit)

## 2011-06-01 NOTE — Telephone Encounter (Signed)
Spoke with patient, schedule appointment to recheck labs in 3 weeks. Patient verbalized understanding of instruction on labs (copy mailed)

## 2011-06-22 ENCOUNTER — Other Ambulatory Visit (INDEPENDENT_AMBULATORY_CARE_PROVIDER_SITE_OTHER): Payer: Medicare Other

## 2011-06-22 DIAGNOSIS — R7989 Other specified abnormal findings of blood chemistry: Secondary | ICD-10-CM

## 2011-06-22 NOTE — Progress Notes (Signed)
Labs only

## 2011-06-25 NOTE — Telephone Encounter (Signed)
Not supposed to be on benazepril, don't RF Ok protonix

## 2011-06-25 NOTE — Telephone Encounter (Signed)
Refill done.  

## 2011-07-06 NOTE — Progress Notes (Signed)
Ok to check at time of CPX

## 2011-07-18 ENCOUNTER — Other Ambulatory Visit: Payer: Self-pay | Admitting: Internal Medicine

## 2011-07-19 NOTE — Telephone Encounter (Signed)
Refill done.  

## 2011-08-05 DIAGNOSIS — Z961 Presence of intraocular lens: Secondary | ICD-10-CM | POA: Diagnosis not present

## 2011-08-05 DIAGNOSIS — E119 Type 2 diabetes mellitus without complications: Secondary | ICD-10-CM | POA: Diagnosis not present

## 2011-08-05 DIAGNOSIS — H1045 Other chronic allergic conjunctivitis: Secondary | ICD-10-CM | POA: Diagnosis not present

## 2011-08-05 LAB — HM DIABETES EYE EXAM

## 2011-08-12 ENCOUNTER — Encounter: Payer: Self-pay | Admitting: *Deleted

## 2011-08-13 ENCOUNTER — Ambulatory Visit (INDEPENDENT_AMBULATORY_CARE_PROVIDER_SITE_OTHER): Payer: Medicare Other | Admitting: Internal Medicine

## 2011-08-13 VITALS — BP 134/76 | HR 69 | Temp 98.0°F | Wt 206.0 lb

## 2011-08-13 DIAGNOSIS — E119 Type 2 diabetes mellitus without complications: Secondary | ICD-10-CM | POA: Diagnosis not present

## 2011-08-13 DIAGNOSIS — J449 Chronic obstructive pulmonary disease, unspecified: Secondary | ICD-10-CM

## 2011-08-13 DIAGNOSIS — E785 Hyperlipidemia, unspecified: Secondary | ICD-10-CM

## 2011-08-13 DIAGNOSIS — R7989 Other specified abnormal findings of blood chemistry: Secondary | ICD-10-CM | POA: Diagnosis not present

## 2011-08-13 DIAGNOSIS — I10 Essential (primary) hypertension: Secondary | ICD-10-CM | POA: Diagnosis not present

## 2011-08-13 DIAGNOSIS — Z0000 Encounter for general adult medical examination without abnormal findings: Secondary | ICD-10-CM | POA: Diagnosis not present

## 2011-08-13 DIAGNOSIS — M1990 Unspecified osteoarthritis, unspecified site: Secondary | ICD-10-CM

## 2011-08-13 LAB — HEPATIC FUNCTION PANEL
ALT: 17 U/L (ref 0–35)
AST: 23 U/L (ref 0–37)
Albumin: 3.5 g/dL (ref 3.5–5.2)
Alkaline Phosphatase: 50 U/L (ref 39–117)
Bilirubin, Direct: 0 mg/dL (ref 0.0–0.3)
Total Bilirubin: 0.4 mg/dL (ref 0.3–1.2)
Total Protein: 7.7 g/dL (ref 6.0–8.3)

## 2011-08-13 LAB — CBC WITH DIFFERENTIAL/PLATELET
Eosinophils Relative: 2 % (ref 0.0–5.0)
HCT: 38.8 % (ref 36.0–46.0)
Hemoglobin: 12.5 g/dL (ref 12.0–15.0)
Lymphs Abs: 2.8 10*3/uL (ref 0.7–4.0)
Monocytes Relative: 8.9 % (ref 3.0–12.0)
Neutro Abs: 5.3 10*3/uL (ref 1.4–7.7)
RDW: 14.1 % (ref 11.5–14.6)
WBC: 9.2 10*3/uL (ref 4.5–10.5)

## 2011-08-13 LAB — LIPID PANEL
Cholesterol: 199 mg/dL (ref 0–200)
HDL: 35.4 mg/dL — ABNORMAL LOW (ref >39.00)
LDL Cholesterol: 129 mg/dL — ABNORMAL HIGH (ref 0–99)
Total CHOL/HDL Ratio: 6
Triglycerides: 174 mg/dL — ABNORMAL HIGH (ref 0.0–149.0)
VLDL: 34.8 mg/dL (ref 0.0–40.0)

## 2011-08-13 LAB — HEMOGLOBIN A1C: Hgb A1c MFr Bld: 6.7 % — ABNORMAL HIGH (ref 4.6–6.5)

## 2011-08-13 NOTE — Assessment & Plan Note (Signed)
Well-controlled at this point

## 2011-08-13 NOTE — Assessment & Plan Note (Signed)
Td 2012 pneumonia shot 2010 shingles shot-- 8054  78 year old lady with a family history of breast cancer, doing well. breast exam today is neg MMG was neg 3-12, patient knows she is due. Plans to call no further Paps  Cscope 2004 : tics, hemorrhoids  had rectal bleeding and anemia, colonoscopyt 08/2008 : tics, no polyps   bone density test 10-2006 and 6-2011normal, vitamin D normal 2011

## 2011-08-13 NOTE — Assessment & Plan Note (Addendum)
LFTs noted to be elevated for the first time in April 2013, she was taking Crestor and metformin for a while. Since then we are holding metformin, LFTs are gradually decreasing. Beer rarely takes Tylenol PM Plan: LFTs Reintroduced to statins? further evaluation?

## 2011-08-13 NOTE — Assessment & Plan Note (Signed)
On metformin, check a hemoglobin A1c. LFTs have been slightly elevated, see below

## 2011-08-13 NOTE — Assessment & Plan Note (Addendum)
Self d/c Celebrex due to rectal bleeding. Rarely takes Tylenol due to increased LFTs.

## 2011-08-13 NOTE — Assessment & Plan Note (Signed)
Holding Crestor due to increased LFTs. Check FLP

## 2011-08-13 NOTE — Progress Notes (Signed)
  Subjective:    Patient ID: Rebecca Elliott, female    DOB: 18-Aug-1926, 78 y.o.   MRN: [id number]  HPI Here for Medicare AWV:  1. Risk factors based on Past M, S, F history: reviewed 2. Physical Activities:  Active at home,no routine exercise  3. Depression/mood:  Not an active issue at this point  4. Hearing: No problemss noted or reported  5. ADL's: totally independent 6. Fall Risk: fell 04-2011, prevention discussed  7. home Safety: does feel safe at home  8. Height, weight, &visual acuity: see VS, sees eye doctor routinely 9. Counseling: provided 10. Labs ordered based on risk factors: if needed  11. Referral Coordination: if needed 12.  Care Plan, see assessment and plan  13.   Cognitive Assessment: motor and cognition seem appropriate for age   In addition, today we discussed the following: DJD, discontinue Celebrex because occasional rectal bleeding . Rectal bleeding resolved. She's up-to-date on her colonoscopies. History of COPD, asthma, good compliance with medications, uses ventrally in less than 2 times a week. Diabetes, good compliance with metformin, no ambulatory blood sugars. High cholesterol, currently holding Crestor due to elevated LFTs. Hypertension, good medication compliance and reports her BPs are within normal although she couldn't tell me  her readings.  Past Medical History:   AODM   HTN DEPRESSION   COPD and ASTHMA   GERD and HH WITH REFLUX   Allergic rhinitis   DJD Past Surgical History:   Appendectomy   Cataract extraction-bilateral   Cholecystectomy   Tubal ligation   Tonsillectomy   Hysterectomy  (apparently no oophorectomy) Bilateral Breast Tumor   Family History: breast cancer--sister (x2?) colon cancer --no DM--F, sister MI-- brother (early 54s), Son Pancreatic Cancer-- Son Nurse, adult  Social History: daughter lives w/her  widow (1994), children 77 (lost 3 sons,2 alive, daughters) smoker. Quit 1994 Alcohol Use -  no Illicit Drug Use - no    Review of Systems No nausea, vomiting, diarrhea.  No chest pain, has dyspnea on exertion which is at baseline. No palpitation or lower extremity edema. No dysuria or gross hematuria.       Objective:   Physical Exam General -- alert, well-developed Neck --no thyromegaly , normal carotid pulse Breasts-- No mass, nodules, thickening, tenderness, bulging, retraction, inflamation, nipple discharge or skin changes noted.  no axillary lymph nodes Lungs -- normal respiratory effort, no intercostal retractions, no accessory muscle use, and normal breath sounds.   Heart-- normal rate, regular rhythm, no murmur, and no gallop.   Abdomen--soft, non-tender, no distention, no masses, no HSM, no guarding, and no rigidity.   DIABETIC FEET EXAM: No lower extremity edema Normal pedal pulses bilaterally Skin and nails are normal without calluses Pinprick examination of the feet normal. Neurologic-- alert & oriented X3 and strength normal in all extremities. Psych-- Cognition and judgment appear intact. Alert and cooperative with normal attention span and concentration.  not anxious appearing and not depressed appearing.       Assessment & Plan:

## 2011-08-17 MED ORDER — ATORVASTATIN CALCIUM 10 MG PO TABS: 10.0000 mg | ORAL_TABLET | Freq: Every day | ORAL | Status: DC

## 2011-08-17 NOTE — Addendum Note (Signed)
Addended by: Douglass Rivers T on: 08/17/2011 05:39 PM   Modules accepted: Orders

## 2011-09-27 ENCOUNTER — Other Ambulatory Visit (INDEPENDENT_AMBULATORY_CARE_PROVIDER_SITE_OTHER): Payer: Medicare Other

## 2011-09-27 DIAGNOSIS — E7800 Pure hypercholesterolemia, unspecified: Secondary | ICD-10-CM | POA: Diagnosis not present

## 2011-09-27 LAB — LIPID PANEL
HDL: 38.3 mg/dL — ABNORMAL LOW (ref >39.00)
Total CHOL/HDL Ratio: 3

## 2011-09-27 LAB — AST: AST: 24 U/L (ref 0–37)

## 2011-09-28 ENCOUNTER — Other Ambulatory Visit: Payer: Self-pay | Admitting: Internal Medicine

## 2011-09-28 NOTE — Telephone Encounter (Signed)
Refill done.  

## 2011-09-28 NOTE — Progress Notes (Signed)
Labs only

## 2011-09-29 ENCOUNTER — Other Ambulatory Visit: Payer: Self-pay | Admitting: Internal Medicine

## 2011-09-29 NOTE — Telephone Encounter (Signed)
Refill done.  

## 2011-10-01 ENCOUNTER — Encounter: Payer: Self-pay | Admitting: *Deleted

## 2011-10-07 ENCOUNTER — Telehealth: Payer: Self-pay | Admitting: Internal Medicine

## 2011-10-07 NOTE — Telephone Encounter (Signed)
Pts [name]) left vm on triage line stating the pt needs a 90 day supply of atorvastatin because she uses mail order ([facility]). [name] can be reached at [phone] with any questions.

## 2011-10-08 MED ORDER — ATORVASTATIN CALCIUM 10 MG PO TABS: 10.0000 mg | ORAL_TABLET | Freq: Every day | ORAL | Status: DC

## 2011-10-08 NOTE — Telephone Encounter (Signed)
Re-sent rx for a 90-day supply.

## 2011-11-03 ENCOUNTER — Other Ambulatory Visit: Payer: Self-pay | Admitting: Internal Medicine

## 2011-11-03 NOTE — Telephone Encounter (Signed)
Refill done.  

## 2011-11-15 ENCOUNTER — Ambulatory Visit (INDEPENDENT_AMBULATORY_CARE_PROVIDER_SITE_OTHER): Payer: Medicare Other | Admitting: Internal Medicine

## 2011-11-15 ENCOUNTER — Encounter: Payer: Self-pay | Admitting: Internal Medicine

## 2011-11-15 VITALS — BP 134/72 | HR 67 | Temp 98.0°F | Wt 210.0 lb

## 2011-11-15 DIAGNOSIS — E785 Hyperlipidemia, unspecified: Secondary | ICD-10-CM

## 2011-11-15 DIAGNOSIS — E119 Type 2 diabetes mellitus without complications: Secondary | ICD-10-CM

## 2011-11-15 DIAGNOSIS — Z23 Encounter for immunization: Secondary | ICD-10-CM

## 2011-11-15 DIAGNOSIS — I10 Essential (primary) hypertension: Secondary | ICD-10-CM

## 2011-11-15 DIAGNOSIS — M1990 Unspecified osteoarthritis, unspecified site: Secondary | ICD-10-CM

## 2011-11-15 LAB — BASIC METABOLIC PANEL
CO2: 29 mEq/L (ref 19–32)
Calcium: 9.1 mg/dL (ref 8.4–10.5)
Chloride: 100 mEq/L (ref 96–112)
Creatinine, Ser: 1.1 mg/dL (ref 0.4–1.2)
Glucose, Bld: 115 mg/dL — ABNORMAL HIGH (ref 70–99)
Sodium: 137 mEq/L (ref 135–145)

## 2011-11-15 NOTE — Assessment & Plan Note (Signed)
Continue with right knee pain. Previously Celebrex caused some bleeding, she has been somehow reluctant to take Tylenol do to increased LFTs. We discussed possibly referral to orthopedic surgery, she may benefit from a local injections and will avoid need of frequent oral medication. She is in agreement. Nevertheless I recommend to take Tylenol 500 mg 3 times a day as needed

## 2011-11-15 NOTE — Assessment & Plan Note (Signed)
Well-controlled, and no change, check a BMP

## 2011-11-15 NOTE — Assessment & Plan Note (Addendum)
Well-controlled per last A1c, re-check an A1c

## 2011-11-15 NOTE — Progress Notes (Signed)
  Subjective:    Patient ID: Rebecca Elliott, female    DOB: July 08, 1926, 78 y.o.   MRN: [id number]  HPI Office visit Diabetes, good medication compliance, no ambulatory blood sugars. DJD, right knee pain continued to be an issue, occasionally gets swollen. Has some difficulty getting in and out of her car. Currently taking no medications. High cholesterol, since the last time she was here, she started Lipitor, reports no side effects. LFTs were okay.   Past Medical History:   AODM   HTN DEPRESSION   COPD and ASTHMA   GERD and HH WITH REFLUX   Allergic rhinitis    DJD  Past Surgical History:   Appendectomy   Cataract extraction-bilateral   Cholecystectomy   Tubal ligation   Tonsillectomy   Hysterectomy  (apparently no oophorectomy) Bilateral Breast Tumor   Family History: breast cancer--sister (x2?) colon cancer --no DM--F, sister MI-- brother (early 57s), Son Pancreatic Cancer-- Son Nurse, adult  Social History: daughter lives w/her   widow (1994), children 71 (lost 3 sons,2 alive, daughters) smoker. Quit 1994 Alcohol Use - no Illicit Drug Use - no    Review of Systems No nausea, vomiting, diarrhea No chest pain or shortness of breath     Objective:   Physical Exam General -- alert, well-developed, and overweight appearing. No apparent distress.   Lungs -- normal respiratory effort, no intercostal retractions, no accessory muscle use, and normal breath sounds.   Heart-- normal rate, regular rhythm, no murmur, and no gallop.   Extremities-- no pretibial edema bilaterally , knees with changes consistent with DJD, right knee without obvious effusion, no warmness. Left calf is actually slightly larger than the right one, nontender Neurologic-- alert & oriented X3 and strength normal in all extremities. Psych-- Cognition and judgment appear intact. Alert and cooperative with normal attention span and concentration.  not anxious appearing and not depressed  appearing.       Assessment & Plan:

## 2011-11-15 NOTE — Assessment & Plan Note (Signed)
After holding Crestor, LFTs normalized, Lipitor was reintroduce at 10 mg daily, she is tolerating well.Marland Kitchen LFTs were normal. Plan: Recheck LFTs

## 2011-11-15 NOTE — Patient Instructions (Addendum)
Okay to take Tylenol 500 mg one tablet 3 times a day as needed Come back in 4-5 months for a checkup.

## 2011-11-18 ENCOUNTER — Encounter: Payer: Self-pay | Admitting: *Deleted

## 2011-11-22 DIAGNOSIS — M25569 Pain in unspecified knee: Secondary | ICD-10-CM | POA: Diagnosis not present

## 2011-11-22 DIAGNOSIS — M25579 Pain in unspecified ankle and joints of unspecified foot: Secondary | ICD-10-CM | POA: Diagnosis not present

## 2011-11-25 DIAGNOSIS — M25569 Pain in unspecified knee: Secondary | ICD-10-CM | POA: Diagnosis not present

## 2011-11-25 DIAGNOSIS — M25579 Pain in unspecified ankle and joints of unspecified foot: Secondary | ICD-10-CM | POA: Diagnosis not present

## 2011-11-29 DIAGNOSIS — M25579 Pain in unspecified ankle and joints of unspecified foot: Secondary | ICD-10-CM | POA: Diagnosis not present

## 2011-11-29 DIAGNOSIS — M25569 Pain in unspecified knee: Secondary | ICD-10-CM | POA: Diagnosis not present

## 2011-11-30 DIAGNOSIS — M25579 Pain in unspecified ankle and joints of unspecified foot: Secondary | ICD-10-CM | POA: Diagnosis not present

## 2011-11-30 DIAGNOSIS — M25569 Pain in unspecified knee: Secondary | ICD-10-CM | POA: Diagnosis not present

## 2011-12-02 DIAGNOSIS — M25579 Pain in unspecified ankle and joints of unspecified foot: Secondary | ICD-10-CM | POA: Diagnosis not present

## 2011-12-02 DIAGNOSIS — M25569 Pain in unspecified knee: Secondary | ICD-10-CM | POA: Diagnosis not present

## 2011-12-06 DIAGNOSIS — M25569 Pain in unspecified knee: Secondary | ICD-10-CM | POA: Diagnosis not present

## 2011-12-08 DIAGNOSIS — M25569 Pain in unspecified knee: Secondary | ICD-10-CM | POA: Diagnosis not present

## 2011-12-13 DIAGNOSIS — M25579 Pain in unspecified ankle and joints of unspecified foot: Secondary | ICD-10-CM | POA: Diagnosis not present

## 2011-12-13 DIAGNOSIS — M25569 Pain in unspecified knee: Secondary | ICD-10-CM | POA: Diagnosis not present

## 2011-12-20 ENCOUNTER — Other Ambulatory Visit: Payer: Self-pay | Admitting: Internal Medicine

## 2011-12-20 NOTE — Telephone Encounter (Signed)
Refill done.  

## 2011-12-21 DIAGNOSIS — M25579 Pain in unspecified ankle and joints of unspecified foot: Secondary | ICD-10-CM | POA: Diagnosis not present

## 2011-12-21 DIAGNOSIS — M25569 Pain in unspecified knee: Secondary | ICD-10-CM | POA: Diagnosis not present

## 2011-12-23 DIAGNOSIS — M25579 Pain in unspecified ankle and joints of unspecified foot: Secondary | ICD-10-CM | POA: Diagnosis not present

## 2011-12-23 DIAGNOSIS — M25569 Pain in unspecified knee: Secondary | ICD-10-CM | POA: Diagnosis not present

## 2011-12-29 DIAGNOSIS — M25569 Pain in unspecified knee: Secondary | ICD-10-CM | POA: Diagnosis not present

## 2011-12-29 DIAGNOSIS — M25579 Pain in unspecified ankle and joints of unspecified foot: Secondary | ICD-10-CM | POA: Diagnosis not present

## 2011-12-31 DIAGNOSIS — M25569 Pain in unspecified knee: Secondary | ICD-10-CM | POA: Diagnosis not present

## 2011-12-31 DIAGNOSIS — M25579 Pain in unspecified ankle and joints of unspecified foot: Secondary | ICD-10-CM | POA: Diagnosis not present

## 2012-06-11 ENCOUNTER — Other Ambulatory Visit: Payer: Self-pay | Admitting: Internal Medicine

## 2012-06-13 NOTE — Telephone Encounter (Signed)
Refill done.  

## 2012-07-10 ENCOUNTER — Encounter: Payer: Self-pay | Admitting: Internal Medicine

## 2012-07-10 ENCOUNTER — Ambulatory Visit (INDEPENDENT_AMBULATORY_CARE_PROVIDER_SITE_OTHER): Payer: Medicare Other | Admitting: Internal Medicine

## 2012-07-10 VITALS — BP 138/65 | HR 70 | Temp 98.2°F | Wt 209.2 lb

## 2012-07-10 DIAGNOSIS — J4489 Other specified chronic obstructive pulmonary disease: Secondary | ICD-10-CM

## 2012-07-10 DIAGNOSIS — E119 Type 2 diabetes mellitus without complications: Secondary | ICD-10-CM

## 2012-07-10 DIAGNOSIS — I10 Essential (primary) hypertension: Secondary | ICD-10-CM | POA: Diagnosis not present

## 2012-07-10 DIAGNOSIS — J449 Chronic obstructive pulmonary disease, unspecified: Secondary | ICD-10-CM | POA: Diagnosis not present

## 2012-07-10 DIAGNOSIS — E785 Hyperlipidemia, unspecified: Secondary | ICD-10-CM | POA: Diagnosis not present

## 2012-07-10 LAB — BASIC METABOLIC PANEL
Calcium: 8.9 mg/dL (ref 8.4–10.5)
GFR: 41.07 mL/min — ABNORMAL LOW (ref >60.00)
Glucose, Bld: 155 mg/dL — ABNORMAL HIGH (ref 70–99)
Potassium: 3.9 mEq/L (ref 3.5–5.1)
Sodium: 140 mEq/L (ref 135–145)

## 2012-07-10 LAB — LIPID PANEL
Cholesterol: 143 mg/dL (ref 0–200)
HDL: 32.9 mg/dL — ABNORMAL LOW (ref >39.00)
Total CHOL/HDL Ratio: 4
Triglycerides: 158 mg/dL — ABNORMAL HIGH (ref 0.0–149.0)

## 2012-07-10 LAB — HEMOGLOBIN A1C: Hgb A1c MFr Bld: 7.5 % — ABNORMAL HIGH (ref 4.6–6.5)

## 2012-07-10 LAB — AST: AST: 45 U/L — ABNORMAL HIGH (ref 0–37)

## 2012-07-10 MED ORDER — ALBUTEROL SULFATE HFA 108 (90 BASE) MCG/ACT IN AERS: 2.0000 | INHALATION_SPRAY | Freq: Four times a day (QID) | RESPIRATORY_TRACT | Status: DC | PRN

## 2012-07-10 MED ORDER — TIOTROPIUM BROMIDE MONOHYDRATE 18 MCG IN CAPS: ORAL_CAPSULE | RESPIRATORY_TRACT | Status: DC

## 2012-07-10 NOTE — Assessment & Plan Note (Signed)
Controlled, use albuterol 3- 4 times a week, recommend to call if she requires more than that

## 2012-07-10 NOTE — Assessment & Plan Note (Addendum)
Well controlled, no change, check FLP AST ALT

## 2012-07-10 NOTE — Assessment & Plan Note (Addendum)
BP in the 140s, no change for now. Check a BMP. I noted there  is no EKG in the chart, got one today for baseline--- NSR

## 2012-07-10 NOTE — Assessment & Plan Note (Signed)
Labs

## 2012-07-10 NOTE — Patient Instructions (Addendum)
Come back in 4 months for a yearly check

## 2012-07-10 NOTE — Progress Notes (Signed)
  Subjective:    Patient ID: Rebecca Elliott, female    DOB: Aug 11, 1926, 79 y.o.   MRN: [id number]  HPI ROV DJD, saw orthopedic surgery, status post a shot in the knee and physical therapy,overall feels better. COPD, good compliance with medications, requiring albuterol on average one time a week (during Fall)  and~ 3- 4 times a week (summer) she thinks related to the "hot weather". High cholesterol good medication compliance Hypertension good medication compliance, ambulatory BPs around 140. BP today 138 Diabetes, good medication compliance.  Past Medical History:   AODM   HTN DEPRESSION   COPD and ASTHMA   GERD and HH WITH REFLUX   Allergic rhinitis    DJD  Past Surgical History:   Appendectomy   Cataract extraction-bilateral   Cholecystectomy   Tubal ligation   Tonsillectomy   Hysterectomy  (apparently no oophorectomy) Bilateral Breast Tumor   Family History: breast cancer--sister (x2?) colon cancer --no DM--F, sister MI-- brother (early 54s), Son Pancreatic Cancer-- Son Nurse, adult  Social History: daughter lives w/her   widow (1994), children 14 (lost 3 sons,2 alive, daughters) smoker. Quit 1994 Alcohol Use - no Illicit Drug Use - no  Review of Systems No chest pain, occasional cough or sputum. No hemoptysis. No nausea, vomiting, diarrhea or blood in the stools.     Objective:   Physical Exam BP 138/65  Pulse 70  Temp(Src) 98.2 F (36.8 C) (Oral)  Wt 209 lb 3.2 oz (94.892 kg)  BMI 33.78 kg/m2  SpO2 93%  General -- alert, well-developed, NAD .   Lungs -- normal respiratory effort, no intercostal retractions, no accessory muscle use, and normal breath sounds.   Heart-- normal rate, regular rhythm, no murmur, and no gallop.   DIABETIC FEET EXAM: No lower extremity edema Normal pedal pulses bilaterally Skin and nails are normal without calluses Pinprick examination of the feet normal. Neurologic-- alert & oriented X3 and strength normal in  all extremities. Psych-- Cognition and judgment appear intact. Alert and cooperative with normal attention span and concentration.  not anxious appearing and not depressed appearing.       Assessment & Plan:

## 2012-07-14 ENCOUNTER — Other Ambulatory Visit: Payer: Self-pay | Admitting: Internal Medicine

## 2012-07-14 NOTE — Telephone Encounter (Signed)
Refill done.  

## 2012-07-17 ENCOUNTER — Ambulatory Visit: Payer: Medicare Other | Admitting: Internal Medicine

## 2012-07-25 ENCOUNTER — Telehealth: Payer: Self-pay | Admitting: Internal Medicine

## 2012-07-25 DIAGNOSIS — I10 Essential (primary) hypertension: Secondary | ICD-10-CM

## 2012-07-25 NOTE — Telephone Encounter (Signed)
Advise patient, I review her labs: Diabetes needs better control, add Januvia 100 mg one by mouth daily. #30 and 3 refills, okay samples as well. LFTs are slightly elevated, recheck on return to the office. Creatinine elevated, recommend to drink plenty of fluids, if she's taking for what ever reason OTC Motrin, naproxen, advil, aleve: she needs to stop.  Cholesterol okay, triglycerides slightly elevated, reassess in few months. Please arrange a BMP dx HTN 1 month from now and a OV in 3

## 2012-07-25 NOTE — Telephone Encounter (Signed)
Lmovm to call office.

## 2012-07-26 MED ORDER — SITAGLIPTIN PHOSPHATE 100 MG PO TABS: 100.0000 mg | ORAL_TABLET | Freq: Every day | ORAL | Status: DC

## 2012-07-26 NOTE — Telephone Encounter (Signed)
Discuss with patient labs ordered. Samples place up front for pickup

## 2012-08-10 DIAGNOSIS — H1045 Other chronic allergic conjunctivitis: Secondary | ICD-10-CM | POA: Diagnosis not present

## 2012-08-10 DIAGNOSIS — H538 Other visual disturbances: Secondary | ICD-10-CM | POA: Diagnosis not present

## 2012-08-10 DIAGNOSIS — H524 Presbyopia: Secondary | ICD-10-CM | POA: Diagnosis not present

## 2012-08-10 DIAGNOSIS — H5210 Myopia, unspecified eye: Secondary | ICD-10-CM | POA: Diagnosis not present

## 2012-08-10 DIAGNOSIS — E119 Type 2 diabetes mellitus without complications: Secondary | ICD-10-CM | POA: Diagnosis not present

## 2012-08-10 DIAGNOSIS — H04129 Dry eye syndrome of unspecified lacrimal gland: Secondary | ICD-10-CM | POA: Diagnosis not present

## 2012-08-10 DIAGNOSIS — Z961 Presence of intraocular lens: Secondary | ICD-10-CM | POA: Diagnosis not present

## 2012-08-10 DIAGNOSIS — H26499 Other secondary cataract, unspecified eye: Secondary | ICD-10-CM | POA: Diagnosis not present

## 2012-08-22 ENCOUNTER — Encounter: Payer: Self-pay | Admitting: Internal Medicine

## 2012-08-24 ENCOUNTER — Other Ambulatory Visit: Payer: Self-pay | Admitting: Internal Medicine

## 2012-08-24 NOTE — Telephone Encounter (Signed)
Refill done per protocol.  

## 2012-08-28 ENCOUNTER — Encounter: Payer: Self-pay | Admitting: Internal Medicine

## 2012-08-29 ENCOUNTER — Other Ambulatory Visit (INDEPENDENT_AMBULATORY_CARE_PROVIDER_SITE_OTHER): Payer: Medicare Other

## 2012-08-29 DIAGNOSIS — I10 Essential (primary) hypertension: Secondary | ICD-10-CM | POA: Diagnosis not present

## 2012-08-29 LAB — BASIC METABOLIC PANEL
BUN: 19 mg/dL (ref 6–23)
CO2: 27 mEq/L (ref 19–32)
Calcium: 9 mg/dL (ref 8.4–10.5)
Creatinine, Ser: 1.1 mg/dL (ref 0.4–1.2)
Glucose, Bld: 119 mg/dL — ABNORMAL HIGH (ref 70–99)

## 2012-10-24 ENCOUNTER — Other Ambulatory Visit: Payer: Self-pay | Admitting: Internal Medicine

## 2012-10-24 NOTE — Telephone Encounter (Signed)
rx refilled per protocol. DJR  

## 2012-11-07 ENCOUNTER — Telehealth: Payer: Self-pay

## 2012-11-07 NOTE — Telephone Encounter (Signed)
Medication and allergies: reviewed  90 day supply/mail order: [facility] Local [facility]: [facility] [place]   Immunizations due:  Admin flu vaccine upon arrival All other vaccines UTD  A/P:   MMG was neg 04/2010 no further Paps colonoscopyt 08/2008 : tics, no polyps; recommended q 10 years Last bone density test 06/2009 WNL No changes to PMH-PSH-FH reviewed  To Discuss with Provider: No questions at this time

## 2012-11-09 ENCOUNTER — Ambulatory Visit (INDEPENDENT_AMBULATORY_CARE_PROVIDER_SITE_OTHER): Payer: Medicare Other | Admitting: Internal Medicine

## 2012-11-09 ENCOUNTER — Encounter: Payer: Self-pay | Admitting: Internal Medicine

## 2012-11-09 VITALS — BP 132/68 | HR 74 | Temp 98.3°F | Ht 65.9 in | Wt 208.6 lb

## 2012-11-09 DIAGNOSIS — J4489 Other specified chronic obstructive pulmonary disease: Secondary | ICD-10-CM

## 2012-11-09 DIAGNOSIS — Z23 Encounter for immunization: Secondary | ICD-10-CM | POA: Diagnosis not present

## 2012-11-09 DIAGNOSIS — E119 Type 2 diabetes mellitus without complications: Secondary | ICD-10-CM | POA: Diagnosis not present

## 2012-11-09 DIAGNOSIS — R7989 Other specified abnormal findings of blood chemistry: Secondary | ICD-10-CM

## 2012-11-09 DIAGNOSIS — I10 Essential (primary) hypertension: Secondary | ICD-10-CM | POA: Diagnosis not present

## 2012-11-09 DIAGNOSIS — J449 Chronic obstructive pulmonary disease, unspecified: Secondary | ICD-10-CM | POA: Diagnosis not present

## 2012-11-09 DIAGNOSIS — Z0000 Encounter for general adult medical examination without abnormal findings: Secondary | ICD-10-CM

## 2012-11-09 DIAGNOSIS — E785 Hyperlipidemia, unspecified: Secondary | ICD-10-CM

## 2012-11-09 LAB — CBC WITH DIFFERENTIAL/PLATELET
Eosinophils Relative: 2.1 % (ref 0.0–5.0)
HCT: 36.7 % (ref 36.0–46.0)
Lymphs Abs: 2.5 10*3/uL (ref 0.7–4.0)
MCV: 78 fl (ref 78.0–100.0)
Monocytes Absolute: 0.9 10*3/uL (ref 0.1–1.0)
Platelets: 291 10*3/uL (ref 150.0–400.0)
RDW: 16.8 % — ABNORMAL HIGH (ref 11.5–14.6)

## 2012-11-09 LAB — TSH: TSH: 2.13 u[IU]/mL (ref 0.35–5.50)

## 2012-11-09 MED ORDER — SITAGLIPTIN PHOSPHATE 100 MG PO TABS: 100.0000 mg | ORAL_TABLET | Freq: Every day | ORAL | Status: DC

## 2012-11-09 NOTE — Assessment & Plan Note (Addendum)
Labs , cont lipitor

## 2012-11-09 NOTE — Assessment & Plan Note (Addendum)
Based on last A1c, we added Januvia, she never got a  supply from her pharmacy thus took it x few weeks only. Plan: Samples of januvia ,  new prescription was sent today. labs

## 2012-11-09 NOTE — Progress Notes (Signed)
Subjective:    Patient ID: Rebecca Elliott, female    DOB: 1926-07-11, 79 y.o.   MRN: [id number]  HPI Here for Medicare AWV:  1. Risk factors based on Past M, S, F history: reviewed 2. Physical Activities:  Active at home,no routine exercise   3. Depression/mood:  Neg screening 4. Hearing: some decrease hearing, declines referral 5. ADL's: totally independent, still drives  6. Fall Risk: fell 04-2011, prevention discussed   7. home Safety: does feel safe at home   8. Height, weight, &visual acuity: see VS, sees eye doctor routinely (just got glasses) 9. Counseling: provided 10. Labs ordered based on risk factors: if needed   11. Referral Coordination: if needed 12.  Care Plan, see assessment and plan   13.   Cognitive Assessment: motor and cognition seem appropriate for age   In addition, today we discussed the following: COPD, good compliance with the Spiriva, uses albuterol on average once a week Diabetes, good compliance with metformin but never got a supply of Januvia. No ambulatory blood sugars. Hypertension, ambulatory BPs 140s. States he's trying to do a better job with diet. High cholesterol, good compliance with medications.   Past Medical History  Diagnosis Date  . Hypertension   . Diabetes mellitus     as an adult  . Hyperlipidemia   . Depression   . COPD  and ASTHMA   . DJD (degenerative joint disease)   . GERD (gastroesophageal reflux disease)     and HH w/ reflux  . History of colonic polyps   . Allergic rhinitis    Past Surgical History  Procedure Laterality Date  . Appendectomy    . Cataract extraction, bilateral    . Cholecystectomy    . Tubal ligation    . Tonsillectomy    . Abdominal hysterectomy      apparently no oophorectomy  . Bilateral breast tumor     History   Social History  . Marital Status: Widowed    Spouse Name: N/A    Number of Children: 60  . Years of Education: N/A   Occupational History  . retired     Social History Main  Topics  . Smoking status: Former Research scientist (life sciences)  . Smokeless tobacco: Never Used  . Alcohol Use: No  . Drug Use: No  . Sexual Activity: Not on file   Other Topics Concern  . Not on file   Social History Narrative   widow (1994), children 5 (lost 3 sons,2 alive, daughters)   Daughter lives w/ her, has another Naval architect       Family History  Problem Relation Age of Onset  . Breast cancer Sister     x 2  . Colon cancer Neg Hx   . Diabetes Father   . Diabetes Sister   . Heart attack Brother     early 42s  . Heart attack Son   . Pancreatic cancer Son   . Stomach cancer Sister     Review of Systems No  CP, occ SOB if goes to her seconf floor in a hurry; no lower extremity edema Denies  nausea, vomiting diarrhea occ mild hemorrhoidal bleed No dysuria, gross hematuria, difficulty urinating         Objective:   Physical Exam BP 132/68  Pulse 74  Temp(Src) 98.3 F (36.8 C)  Ht 5' 5.9" (1.674 m)  Wt 208 lb 9.6 oz (94.62 kg)  BMI 33.77 kg/m2  SpO2 97% General -- alert, well-developed, NAD.  Neck --no thyromegaly   Breast-- no dominant mass, skin and nipples normal to inspection on palpation, axillary areas without mass or lymphadenopathy; well heal surgical scars Lungs -- normal respiratory effort, no intercostal retractions, no accessory muscle use, and normal breath sounds.  Heart-- normal rate, regular rhythm, no murmur.  Abdomen-- Not distended, good bowel sounds,soft, non-tender. Extremities-- no pretibial edema bilaterally  Neurologic--  alert & oriented X3. Speech normal, gait normal, strength normal in all extremities.  Psych-- Cognition and judgment appear intact. Cooperative with normal attention span and concentration. No anxious appearing , no depressed appearing.     Assessment & Plan:

## 2012-11-09 NOTE — Assessment & Plan Note (Signed)
Seems doing very well, flu shot today

## 2012-11-09 NOTE — Patient Instructions (Signed)
Get your blood work before you leave   Next visit in 3 months  for a diabetes   follow up . No Fasting Please make an appointment    If you get labs , XRs or precedures we always communicate back to you. Expect a phone call, a letter or check [facility] frequently for reports. If you don't hear from [place] within few days, please call the office   Fall Prevention and Home Safety Falls cause injuries and can affect all age groups. It is possible to use preventive measures to significantly decrease the likelihood of falls. There are many simple measures which can make your home safer and prevent falls. OUTDOORS  Repair cracks and edges of walkways and driveways.  Remove high doorway thresholds.  Trim shrubbery on the main path into your home.  Have good outside lighting.  Clear walkways of tools, rocks, debris, and clutter.  Check that handrails are not broken and are securely fastened. Both sides of steps should have handrails.  Have leaves, snow, and ice cleared regularly.  Use sand or salt on walkways during winter months.  In the garage, clean up grease or oil spills. BATHROOM  Install night lights.  Install grab bars by the toilet and in the tub and shower.  Use non-skid mats or decals in the tub or shower.  Place a plastic non-slip stool in the shower to sit on, if needed.  Keep floors dry and clean up all water on the floor immediately.  Remove soap buildup in the tub or shower on a regular basis.  Secure bath mats with non-slip, double-sided rug tape.  Remove throw rugs and tripping hazards from the floors. BEDROOMS  Install night lights.  Make sure a bedside light is easy to reach.  Do not use oversized bedding.  Keep a telephone by your bedside.  Have a firm chair with side arms to use for getting dressed.  Remove throw rugs and tripping hazards from the floor. KITCHEN  Keep handles on pots and pans turned toward the center of the stove. Use back burners  when possible.  Clean up spills quickly and allow time for drying.  Avoid walking on wet floors.  Avoid hot utensils and knives.  Position shelves so they are not too high or low.  Place commonly used objects within easy reach.  If necessary, use a sturdy step stool with a grab bar when reaching.  Keep electrical cables out of the way.  Do not use floor polish or wax that makes floors slippery. If you must use wax, use non-skid floor wax.  Remove throw rugs and tripping hazards from the floor. STAIRWAYS  Never leave objects on stairs.  Place handrails on both sides of stairways and use them. Fix any loose handrails. Make sure handrails on both sides of the stairways are as long as the stairs.  Check carpeting to make sure it is firmly attached along stairs. Make repairs to worn or loose carpet promptly.  Avoid placing throw rugs at the top or bottom of stairways, or properly secure the rug with carpet tape to prevent slippage. Get rid of throw rugs, if possible.  Have an electrician put in a light switch at the top and bottom of the stairs. OTHER FALL PREVENTION TIPS  Wear low-heel or rubber-soled shoes that are supportive and fit well. Wear closed toe shoes.  When using a stepladder, make sure it is fully opened and both spreaders are firmly locked. Do not climb a closed  stepladder.  Add color or contrast paint or tape to grab bars and handrails in your home. Place contrasting color strips on first and last steps.  Learn and use mobility aids as needed. Install an electrical emergency response system.  Turn on lights to avoid dark areas. Replace light bulbs that burn out immediately. Get light switches that glow.  Arrange furniture to create clear pathways. Keep furniture in the same place.  Firmly attach carpet with non-skid or double-sided tape.  Eliminate uneven floor surfaces.  Select a carpet pattern that does not visually hide the edge of steps.  Be aware of  all pets. OTHER HOME SAFETY TIPS  Set the water temperature for 120 F (48.8 C).  Keep emergency numbers on or near the telephone.  Keep smoke detectors on every level of the home and near sleeping areas. Document Released: 12/25/2001 Document Revised: 07/06/2011 Document Reviewed: 03/26/2011 [facility] Patient Information 2014 [place].

## 2012-11-09 NOTE — Assessment & Plan Note (Signed)
Well-controlled, last BMP normal, no change

## 2012-11-09 NOTE — Assessment & Plan Note (Signed)
Td 2012 pneumonia shot 2010 shingles shot-- 2011 Flu shot today  79 year old lady 2 sisters w/ breast cancer, doing well. Breast exam today is neg MMG was neg 3-12, discussed pro-cons of a MMG, given FH I rec to proceed , if normal, she probably won't need further screening. No further Paps  Cscope 2004 : tics, hemorrhoids  had rectal bleeding and anemia, colonoscopyt 08/2008 : tics, no polyps   bone density test 10-2006 and 6-2011normal, vitamin D normal 2011  Diet-exercise discussed

## 2012-11-09 NOTE — Assessment & Plan Note (Signed)
Well-controlled per last FLP

## 2012-11-11 ENCOUNTER — Other Ambulatory Visit: Payer: Self-pay | Admitting: Internal Medicine

## 2012-11-13 NOTE — Telephone Encounter (Signed)
rx refilled per protocol. DJR  

## 2012-11-15 ENCOUNTER — Encounter: Payer: Self-pay | Admitting: *Deleted

## 2012-12-19 ENCOUNTER — Ambulatory Visit: Payer: Medicare Other

## 2012-12-21 ENCOUNTER — Encounter: Payer: Self-pay | Admitting: Internal Medicine

## 2012-12-23 ENCOUNTER — Other Ambulatory Visit: Payer: Self-pay | Admitting: Internal Medicine

## 2012-12-25 NOTE — Telephone Encounter (Signed)
rx refilled per protocol. DJR  

## 2013-01-24 ENCOUNTER — Ambulatory Visit: Payer: Medicare Other

## 2013-01-25 ENCOUNTER — Ambulatory Visit (INDEPENDENT_AMBULATORY_CARE_PROVIDER_SITE_OTHER): Payer: Medicare Other | Admitting: Internal Medicine

## 2013-01-25 ENCOUNTER — Encounter: Payer: Self-pay | Admitting: Internal Medicine

## 2013-01-25 VITALS — BP 118/61 | HR 70 | Temp 98.0°F | Wt 206.4 lb

## 2013-01-25 DIAGNOSIS — J0110 Acute frontal sinusitis, unspecified: Secondary | ICD-10-CM

## 2013-01-25 DIAGNOSIS — R059 Cough, unspecified: Secondary | ICD-10-CM

## 2013-01-25 DIAGNOSIS — R05 Cough: Secondary | ICD-10-CM

## 2013-01-25 MED ORDER — AMOXICILLIN 500 MG PO CAPS: 500.0000 mg | ORAL_CAPSULE | Freq: Three times a day (TID) | ORAL | Status: DC

## 2013-01-25 MED ORDER — HYDROCODONE-HOMATROPINE 5-1.5 MG/5ML PO SYRP: 5.0000 mL | ORAL_SOLUTION | Freq: Four times a day (QID) | ORAL | Status: DC | PRN

## 2013-01-25 NOTE — Progress Notes (Signed)
Pre visit review using our clinic review tool, if applicable. No additional management support is needed unless otherwise documented below in the visit note. 

## 2013-01-25 NOTE — Patient Instructions (Signed)
Plain Mucinex (NOT D) for thick secretions ;force NON dairy fluids .   Nasal cleansing in the shower as discussed with lather of mild shampoo.After 10 seconds wash off lather while  exhaling through nostrils. Make sure that all residual soap is removed to prevent irritation.  Nasacort AQ OTC 1 spray in each nostril twice a day as needed. Use the "crossover" technique into opposite nostril spraying toward opposite ear @ 45 degree angle, not straight up into nostril.  Plain Allegra (NOT D )  160 daily , Loratidine 10 mg , OR Zyrtec 10 mg @ bedtime  as needed for itchy eyes & sneezing.

## 2013-01-25 NOTE — Progress Notes (Signed)
   Subjective:    Patient ID: Rebecca Elliott, female    DOB: 1926/11/02, 80 y.o.   MRN: [id number]  HPI Symptoms began approximately 9 days ago as itchy, watery eyes, and sneezing. She subsequently developed pain in the frontal sinus area as well as nasal discharge which was yellow with some blood in it. She's had associated pain in her teeth.  Her cough has been intermittently productive and associated with some shortness of breath  and wheezing.  with paroxysms of cough  She has been using Mucinex DM with some benefit.  She has a history of COPD/asthma and uses Spiriva on a maintenance basis and albuterol as needed. She's been using albuterol 2-3 times per week. She quit smoking in 1994       Review of Systems  She is not having pain in the maxillary sinus areas, otic pain, or otic discharge.  She denies fever, chills, sweats       Objective:   Physical Exam General appearance:;well nourished; no acute distress or increased work of breathing is present.  No  lymphadenopathy about the head, neck, or axilla noted.   Eyes: No conjunctival inflammation or lid edema is present.   Ears:  External ear exam shows no significant lesions or deformities.  Otoscopic examination reveals some wax but tympanic membranes are intact bilaterally without bulging, retraction, inflammation or discharge.  Nose:  External nasal examination reveals chronic deformity w/o inflammation. Nasal mucosa are pink and moist without lesions or exudates. No septal dislocation or deviation.No obstruction to airflow.   Oral exam: Only wearing upper denture; lips and gums are healthy appearing.There is no oropharyngeal erythema or exudate noted  or tenderness noted.  Hyponasal speech pattern   Heart:  Normal rate and regular rhythm. S1 and S2 normal without gallop, murmur, click, rub or other extra sounds.   Lungs:Chest clear to auscultation; no wheezes, rhonchi,rales ,or rubs present.No increased work of breathing.   Decreased BS  Extremities:  No cyanosis, edema, or clubbing  noted    Skin: Warm & dry          Assessment & Plan:  #1 acute frontal sinusitis  See orders

## 2013-02-01 ENCOUNTER — Other Ambulatory Visit: Payer: Self-pay | Admitting: Internal Medicine

## 2013-02-01 NOTE — Telephone Encounter (Signed)
Amlodipine and Atorvastatin refilled per protocol. JG//CMA

## 2013-02-02 ENCOUNTER — Other Ambulatory Visit: Payer: Self-pay | Admitting: Internal Medicine

## 2013-02-02 NOTE — Telephone Encounter (Signed)
Spiriva refilled per protocol. JG/CMA

## 2013-02-07 ENCOUNTER — Encounter: Payer: Self-pay | Admitting: *Deleted

## 2013-02-12 ENCOUNTER — Ambulatory Visit (INDEPENDENT_AMBULATORY_CARE_PROVIDER_SITE_OTHER): Payer: Medicare Other | Admitting: Internal Medicine

## 2013-02-12 ENCOUNTER — Encounter: Payer: Self-pay | Admitting: Internal Medicine

## 2013-02-12 VITALS — BP 114/55 | HR 79 | Temp 97.9°F | Wt 206.0 lb

## 2013-02-12 DIAGNOSIS — E119 Type 2 diabetes mellitus without complications: Secondary | ICD-10-CM

## 2013-02-12 DIAGNOSIS — I10 Essential (primary) hypertension: Secondary | ICD-10-CM | POA: Diagnosis not present

## 2013-02-12 LAB — BASIC METABOLIC PANEL
BUN: 24 mg/dL — ABNORMAL HIGH (ref 6–23)
CALCIUM: 9.1 mg/dL (ref 8.4–10.5)
CO2: 27 meq/L (ref 19–32)
CREATININE: 1.5 mg/dL — AB (ref 0.4–1.2)
Chloride: 102 mEq/L (ref 96–112)
GFR: 35.9 mL/min — ABNORMAL LOW (ref >60.00)
GLUCOSE: 114 mg/dL — AB (ref 70–99)
Potassium: 4.1 mEq/L (ref 3.5–5.1)
Sodium: 138 mEq/L (ref 135–145)

## 2013-02-12 LAB — HEMOGLOBIN A1C: Hgb A1c MFr Bld: 6.6 % — ABNORMAL HIGH (ref 4.6–6.5)

## 2013-02-12 NOTE — Progress Notes (Signed)
Pre visit review using our clinic review tool, if applicable. No additional management support is needed unless otherwise documented below in the visit note. 

## 2013-02-12 NOTE — Patient Instructions (Signed)
Get your blood work before you leave    Next visit is for routine check up regards diabetes, hypertension, cholesterol  in 5-6 months, fasting Please make an appointment      Diabetes and Foot Care Diabetes may cause you to have problems because of poor blood supply (circulation) to your feet and legs. This may cause the skin on your feet to become thinner, break easier, and heal more slowly. Your skin may become dry, and the skin may peel and crack. You may also have nerve damage in your legs and feet causing decreased feeling in them. You may not notice minor injuries to your feet that could lead to infections or more serious problems. Taking care of your feet is one of the most important things you can do for yourself.  HOME CARE INSTRUCTIONS  Wear shoes at all times, even in the house. Do not go barefoot. Bare feet are easily injured.  Check your feet daily for blisters, cuts, and redness. If you cannot see the bottom of your feet, use a mirror or ask someone for help.  Wash your feet with warm water (do not use hot water) and mild soap. Then pat your feet and the areas between your toes until they are completely dry. Do not soak your feet as this can dry your skin.  Apply a moisturizing lotion or petroleum jelly (that does not contain alcohol and is unscented) to the skin on your feet and to dry, brittle toenails. Do not apply lotion between your toes.  Trim your toenails straight across. Do not dig under them or around the cuticle. File the edges of your nails with an emery board or nail file.  Do not cut corns or calluses or try to remove them with medicine.  Wear clean socks or stockings every day. Make sure they are not too tight. Do not wear knee-high stockings since they may decrease blood flow to your legs.  Wear shoes that fit properly and have enough cushioning. To break in new shoes, wear them for just a few hours a day. This prevents you from injuring your feet. Always look  in your shoes before you put them on to be sure there are no objects inside.  Do not cross your legs. This may decrease the blood flow to your feet.  If you find a minor scrape, cut, or break in the skin on your feet, keep it and the skin around it clean and dry. These areas may be cleansed with mild soap and water. Do not cleanse the area with peroxide, alcohol, or iodine.  When you remove an adhesive bandage, be sure not to damage the skin around it.  If you have a wound, look at it several times a day to make sure it is healing.  Do not use heating pads or hot water bottles. They may burn your skin. If you have lost feeling in your feet or legs, you may not know it is happening until it is too late.  Make sure your health care provider performs a complete foot exam at least annually or more often if you have foot problems. Report any cuts, sores, or bruises to your health care provider immediately. SEEK MEDICAL CARE IF:   You have an injury that is not healing.  You have cuts or breaks in the skin.  You have an ingrown nail.  You notice redness on your legs or feet.  You feel burning or tingling in your legs or feet.  You have pain or cramps in your legs and feet.  Your legs or feet are numb.  Your feet always feel cold. SEEK IMMEDIATE MEDICAL CARE IF:   There is increasing redness, swelling, or pain in or around a wound.  There is a red line that goes up your leg.  Pus is coming from a wound.  You develop a fever or as directed by your health care provider.  You notice a bad smell coming from an ulcer or wound. Document Released: 01/02/2000 Document Revised: 09/06/2012 Document Reviewed: 06/13/2012 [facility] Patient Information 2014 [place].

## 2013-02-12 NOTE — Progress Notes (Signed)
   Subjective:    Patient ID: Rebecca Elliott, female    DOB: 09-29-1926, 80 y.o.   MRN: [id number]  HPI ROV DM-- good med compliance including Januvia Condition, good medication compliance, she checks her BPs at home and they are within normal.  Past Medical History  Diagnosis Date  . Hypertension   . Diabetes mellitus     as an adult  . Hyperlipidemia   . Depression   . COPD  and ASTHMA   . DJD (degenerative joint disease)   . GERD (gastroesophageal reflux disease)     and HH w/ reflux  . History of colonic polyps   . Allergic rhinitis    Past Surgical History  Procedure Laterality Date  . Appendectomy    . Cataract extraction, bilateral    . Cholecystectomy    . Tubal ligation    . Tonsillectomy    . Abdominal hysterectomy      apparently no oophorectomy  . Bilateral breast tumor       Review of Systems Diet--not very good in December but now she is back in track Exercise--unable to exercise much lately due to the weather but plans to restart as soon as possible Had URI recently, status post antibiotics, no fever chills, symptoms are better Denies chest pain or shortness or breath    Objective:   Physical Exam  BP 114/55  Pulse 79  Temp(Src) 97.9 F (36.6 C)  Wt 206 lb (93.441 kg)  SpO2 93% General -- alert, well-developed, NAD.   Lungs -- normal respiratory effort, no intercostal retractions, no accessory muscle use, and normal breath sounds.  Heart-- normal rate, regular rhythm, no murmur.  DIABETIC FEET EXAM: No lower extremity edema Normal pedal pulses bilaterally Skin normal, nails normal, few  calluses Pinprick examination of the feet normal. Neurologic--  alert & oriented X3. Speech normal, gait normal, strength normal in all extremities.  Psych-- Cognition and judgment appear intact. Cooperative with normal attention span and concentration. No anxious or depressed appearing.       Assessment & Plan:

## 2013-02-12 NOTE — Assessment & Plan Note (Addendum)
Had eyes checke once a year Feet exam negative today, feet care discussed Good compliance and tolerance with meds including Januvia, labs

## 2013-02-12 NOTE — Assessment & Plan Note (Signed)
Good med compliance, labs, rec to call if BP > 140/85

## 2013-02-13 ENCOUNTER — Other Ambulatory Visit: Payer: Self-pay | Admitting: Internal Medicine

## 2013-02-15 ENCOUNTER — Telehealth: Payer: Self-pay

## 2013-02-15 NOTE — Telephone Encounter (Signed)
Relevant patient education mailed to patient.  

## 2013-02-21 ENCOUNTER — Telehealth: Payer: Self-pay | Admitting: Internal Medicine

## 2013-02-21 NOTE — Telephone Encounter (Signed)
Relevant patient education mailed to patient.  

## 2013-02-22 ENCOUNTER — Other Ambulatory Visit: Payer: Self-pay | Admitting: Internal Medicine

## 2013-02-26 ENCOUNTER — Ambulatory Visit (INDEPENDENT_AMBULATORY_CARE_PROVIDER_SITE_OTHER): Payer: Medicare Other | Admitting: Internal Medicine

## 2013-02-26 ENCOUNTER — Encounter: Payer: Self-pay | Admitting: Internal Medicine

## 2013-02-26 VITALS — BP 96/67 | HR 99 | Temp 98.2°F | Wt 208.0 lb

## 2013-02-26 DIAGNOSIS — N390 Urinary tract infection, site not specified: Secondary | ICD-10-CM

## 2013-02-26 DIAGNOSIS — I10 Essential (primary) hypertension: Secondary | ICD-10-CM

## 2013-02-26 DIAGNOSIS — R300 Dysuria: Secondary | ICD-10-CM | POA: Diagnosis not present

## 2013-02-26 NOTE — Progress Notes (Signed)
   Subjective:    Patient ID: Rebecca Elliott, female    DOB: 1926-07-29, 80 y.o.   MRN: [id number]  DOS:  02/26/2013 F/u At the last office visit, labs were normal, creatinine came back high, we decrease Hyzaar to half dose, she reports that her BP remains well-controlled. Today BP was slty low, we rechecked it and is normal, again she feels well.   Past Medical History  Diagnosis Date  . Hypertension   . Diabetes mellitus     as an adult  . Hyperlipidemia   . Depression   . COPD  and ASTHMA   . DJD (degenerative joint disease)   . GERD (gastroesophageal reflux disease)     and HH w/ reflux  . History of colonic polyps   . Allergic rhinitis     Past Surgical History  Procedure Laterality Date  . Appendectomy    . Cataract extraction, bilateral    . Cholecystectomy    . Tubal ligation    . Tonsillectomy    . Abdominal hysterectomy      apparently no oophorectomy  . Bilateral breast tumor      History   Social History  . Marital Status: Widowed    Spouse Name: N/A    Number of Children: 81  . Years of Education: N/A   Occupational History  . retired     Social History Main Topics  . Smoking status: Former Research scientist (life sciences)  . Smokeless tobacco: Never Used  . Alcohol Use: No  . Drug Use: No  . Sexual Activity: Not on file   Other Topics Concern  . Not on file   Social History Narrative   widow (1994), children 5 (lost 3 sons,2 alive, daughters)   Daughter lives w/ her, has another daugther        ROS When asked, admits she had some urinary frequency in the last 10 days, no gross hematuria. Mild dysuria. Symptoms are already better after she drank cranberry juice Denies any abdominal pain or flank pain. No fever or chills.      Objective:   Physical Exam BP 96/67  Pulse 99  Temp(Src) 98.2 F (36.8 C)  Wt 208 lb (94.348 kg)  SpO2 96% BP 120/65 General -- alert, well-developed, NAD.   Lungs -- normal respiratory effort, no intercostal retractions, no  accessory muscle use, and normal breath sounds.  Heart-- normal rate, regular rhythm, no murmur.  Abdomen-- Not distended, good bowel sounds,soft, non-tender. Extremities-- no pretibial edema bilaterally  Psych-- Cognition and judgment appear intact. Cooperative with normal attention span and concentration. No anxious or depressed appearing.     Assessment & Plan:

## 2013-02-26 NOTE — Progress Notes (Signed)
Pre visit review using our clinic review tool, if applicable. No additional management support is needed unless otherwise documented below in the visit note. 

## 2013-02-26 NOTE — Patient Instructions (Signed)
Get your blood work before you leave   Next visit is for routine check up regards your   blood pressure   in 3 months  No need to come back fasting Please make an appointment     Check the  blood pressure 2 or 3 times a  week be sure it is between 110/60 and 140/85. Ideal blood pressure is 120/80. If it is consistently higher or lower, let me know

## 2013-02-26 NOTE — Assessment & Plan Note (Addendum)
Last BMP showed elevated creatinine, we decrease losartan HCT to half tablet daily, ambulatory BPs okay, BP today initially low but we repeated the reading --->  120/65. Plan: Repeat BMP, if creatinine back to baseline recheck in few weeks otherwise will need further eval. She also had some dysuria, will check a UA

## 2013-02-27 ENCOUNTER — Telehealth: Payer: Self-pay | Admitting: Internal Medicine

## 2013-02-27 LAB — URINALYSIS, ROUTINE W REFLEX MICROSCOPIC
Ketones, ur: 15 — AB
NITRITE: NEGATIVE
Specific Gravity, Urine: >=1.03 — AB (ref 1.000–1.030)
Total Protein, Urine: 100 — AB
URINE GLUCOSE: NEGATIVE
UROBILINOGEN UA: 0.2 (ref 0.0–1.0)
pH: 5.5 (ref 5.0–8.0)

## 2013-02-27 LAB — BASIC METABOLIC PANEL
BUN: 21 mg/dL (ref 6–23)
CALCIUM: 8.8 mg/dL (ref 8.4–10.5)
CO2: 25 mEq/L (ref 19–32)
CREATININE: 1.3 mg/dL — AB (ref 0.4–1.2)
Chloride: 104 mEq/L (ref 96–112)
GFR: 41.74 mL/min — AB (ref >60.00)
GLUCOSE: 82 mg/dL (ref 70–99)
Potassium: 4.1 mEq/L (ref 3.5–5.1)
Sodium: 138 mEq/L (ref 135–145)

## 2013-02-27 NOTE — Telephone Encounter (Signed)
Relevant patient education mailed to patient.  

## 2013-03-01 ENCOUNTER — Ambulatory Visit: Payer: Medicare Other | Admitting: Internal Medicine

## 2013-03-01 ENCOUNTER — Encounter: Payer: Self-pay | Admitting: Lab

## 2013-03-02 DIAGNOSIS — R300 Dysuria: Secondary | ICD-10-CM | POA: Diagnosis not present

## 2013-03-02 NOTE — Addendum Note (Signed)
Addended by: Modena Morrow D on: 03/02/2013 09:20 AM   Modules accepted: Orders

## 2013-03-05 LAB — URINE CULTURE: Colony Count: >=100000

## 2013-03-05 MED ORDER — CIPROFLOXACIN HCL 500 MG PO TABS: 500.0000 mg | ORAL_TABLET | Freq: Two times a day (BID) | ORAL | Status: DC

## 2013-03-05 NOTE — Addendum Note (Signed)
Addended by: Peggyann Shoals on: 03/05/2013 01:02 PM   Modules accepted: Orders

## 2013-04-23 ENCOUNTER — Encounter: Payer: Self-pay | Admitting: Internal Medicine

## 2013-04-23 ENCOUNTER — Ambulatory Visit (INDEPENDENT_AMBULATORY_CARE_PROVIDER_SITE_OTHER): Payer: Medicare Other | Admitting: Internal Medicine

## 2013-04-23 VITALS — BP 160/70 | HR 73 | Temp 98.0°F | Wt 206.0 lb

## 2013-04-23 DIAGNOSIS — E119 Type 2 diabetes mellitus without complications: Secondary | ICD-10-CM

## 2013-04-23 DIAGNOSIS — R300 Dysuria: Secondary | ICD-10-CM | POA: Diagnosis not present

## 2013-04-23 DIAGNOSIS — F3289 Other specified depressive episodes: Secondary | ICD-10-CM

## 2013-04-23 DIAGNOSIS — I10 Essential (primary) hypertension: Secondary | ICD-10-CM

## 2013-04-23 DIAGNOSIS — F329 Major depressive disorder, single episode, unspecified: Secondary | ICD-10-CM

## 2013-04-23 DIAGNOSIS — N390 Urinary tract infection, site not specified: Secondary | ICD-10-CM | POA: Diagnosis not present

## 2013-04-23 MED ORDER — METOPROLOL SUCCINATE ER 50 MG PO TB24: ORAL_TABLET | ORAL | Status: DC

## 2013-04-23 NOTE — Progress Notes (Signed)
Subjective:    Patient ID: Rebecca Elliott, female    DOB: 1926/03/17, 80 y.o.   MRN: [id number]  DOS:  04/23/2013 Type of  visit:  Here with one of  her daughters. Her BP has been elevated lately, range from 140-160,   went up to 200 x 2. Also she has not been feeling well the last few weeks. Her other daughter moved in with her around October 2013, and although they have a good relationship that has put  pressure on her. She has not being herself more sad, tearful. Not sleeping well some days. When she said is not feeling well she means fatigue, lack of energy, sometimes lightheadedness. Denies any suicidal ideas  ROS Denies chest pain, dyspnea on exertion essentially at baseline, okay to do all her activities of daily needed, some shortness of breath when she go upstairs. No lower extremity edema. Has COPD, still cough occasionally. No nausea, vomiting, diarrhea or blood in the stools lately. She has dysuria on and off. No stroke symptoms such as a sore speech, facial numbness or diplopia.  Past Medical History  Diagnosis Date  . Hypertension   . Diabetes mellitus     as an adult  . Hyperlipidemia   . Depression   . COPD  and ASTHMA   . DJD (degenerative joint disease)   . GERD (gastroesophageal reflux disease)     and HH w/ reflux  . History of colonic polyps   . Allergic rhinitis     Past Surgical History  Procedure Laterality Date  . Appendectomy    . Cataract extraction, bilateral    . Cholecystectomy    . Tubal ligation    . Tonsillectomy    . Abdominal hysterectomy      apparently no oophorectomy  . Bilateral breast tumor      History   Social History  . Marital Status: Widowed    Spouse Name: N/A    Number of Children: 62  . Years of Education: N/A   Occupational History  . retired     Social History Main Topics  . Smoking status: Former Research scientist (life sciences)  . Smokeless tobacco: Never Used  . Alcohol Use: No  . Drug Use: No  . Sexual Activity: Not on file     Other Topics Concern  . Not on file   Social History Narrative   widow (1994), children 5 (lost 3 sons,2 alive, daughters)   Daughter lives w/ her, has another daugther            Medication List       This list is accurate as of: 04/23/13 11:59 PM.  Always use your most recent med list.               albuterol 108 (90 BASE) MCG/ACT inhaler  Commonly known as:  VENTOLIN HFA  Inhale 2 puffs into the lungs every 6 (six) hours as needed.     amLODipine 10 MG tablet  Commonly known as:  NORVASC  TAKE 1 TABLET DAILY     aspirin 81 MG tablet  Take 81 mg by mouth daily.     atorvastatin 10 MG tablet  Commonly known as:  LIPITOR  TAKE 1 TABLET DAILY     BENEFIBER DRINK MIX PO  Take by mouth 2 (two) times daily.     CENTRUM SILVER Chew  Chew by mouth.     dextromethorphan-guaiFENesin 30-600 MG per 12 hr tablet  Commonly known as:  MUCINEX  DM  Take 1 tablet by mouth daily.     docusate sodium 100 MG capsule  Commonly known as:  COLACE  Take 1-2 by mouth once a day.     loratadine 10 MG tablet  Commonly known as:  CLARITIN  Take 10 mg by mouth daily.     losartan-hydrochlorothiazide 100-12.5 MG per tablet  Commonly known as:  HYZAAR  TAKE 1/2 TABLET DAILY     metFORMIN 500 MG tablet  Commonly known as:  GLUCOPHAGE  TAKE 2 TABLETS (=1000MG )   DAILY WITH BREAKFAST     metoprolol succinate 50 MG 24 hr tablet  Commonly known as:  TOPROL XL  Take 1.5 tablets a day     pantoprazole 40 MG tablet  Commonly known as:  PROTONIX  TAKE 1 TABLET DAILY     sitaGLIPtin 100 MG tablet  Commonly known as:  JANUVIA  Take 1 tablet (100 mg total) by mouth daily.     SPIRIVA HANDIHALER 18 MCG inhalation capsule  Generic drug:  tiotropium  INHALE THE CONTENTS OF ONE CAPSULE DAILY (PLACE       CAPSULE INTO INHALER)           Objective:   Physical Exam BP 160/70  Pulse 73  Temp(Src) 98 F (36.7 C)  Wt 206 lb (93.441 kg)  SpO2 95% General -- alert,  well-developed, NAD.   HEENT-- Not pale.   Lungs -- normal respiratory effort, no intercostal retractions, no accessory muscle use, and normal breath sounds.  Heart-- normal rate, regular rhythm, soft syst  Murmur ?Marland Kitchen  Extremities-- no pretibial edema bilaterally  Neurologic--  alert & oriented X3. Speech- gait- motor-- at baseline Psych-- Cognition and judgment appear intact. Cooperative with normal attention span and concentration. slt emotional but no tearful.     Assessment & Plan:   Dysuria, check a UA per patient request.  Fatigue, lightheadedness. Related to stress anxiety?. Reassess on return to the office

## 2013-04-23 NOTE — Progress Notes (Signed)
Pre visit review using our clinic review tool, if applicable. No additional management support is needed unless otherwise documented below in the visit note. 

## 2013-04-23 NOTE — Patient Instructions (Signed)
Get your blood work before you leave    Next visit is for routine check up regards your blood sugar , blood pressure  in 4-5 weeks  No need to come back fasting Please make an appointment    Increase  metoprolol to 50 mg tablets: 1.5 tablets every day (a total of 75 mg daily) Check the  blood pressure 2 or 3 times a week be sure it is between 110/60 and 140/85. Ideal blood pressure is 120/80. If it is consistently higher or lower, let me know

## 2013-04-24 LAB — BASIC METABOLIC PANEL
BUN: 21 mg/dL (ref 6–23)
CO2: 25 mEq/L (ref 19–32)
Calcium: 8.8 mg/dL (ref 8.4–10.5)
Chloride: 102 mEq/L (ref 96–112)
Creatinine, Ser: 1.1 mg/dL (ref 0.4–1.2)
GFR: 48.12 mL/min — AB (ref >60.00)
Glucose, Bld: 76 mg/dL (ref 70–99)
POTASSIUM: 3.9 meq/L (ref 3.5–5.1)
SODIUM: 137 meq/L (ref 135–145)

## 2013-04-24 LAB — URINALYSIS, ROUTINE W REFLEX MICROSCOPIC
BILIRUBIN URINE: NEGATIVE
HGB URINE DIPSTICK: NEGATIVE
Ketones, ur: NEGATIVE
Leukocytes, UA: NEGATIVE
Nitrite: NEGATIVE
PH: 5.5 (ref 5.0–8.0)
Specific Gravity, Urine: >=1.03 — AB (ref 1.000–1.030)
URINE GLUCOSE: NEGATIVE
Urobilinogen, UA: 0.2 (ref 0.0–1.0)

## 2013-04-24 LAB — AST: AST: 41 U/L — ABNORMAL HIGH (ref 0–37)

## 2013-04-24 LAB — ALT: ALT: 23 U/L (ref 0–35)

## 2013-04-24 LAB — HEMOGLOBIN A1C: Hgb A1c MFr Bld: 6.7 % — ABNORMAL HIGH (ref 4.6–6.5)

## 2013-04-24 NOTE — Assessment & Plan Note (Signed)
Hypertension, Not well-controlled, Increase metoprolol dose from 37.5 mg to 75 mg. Check a BMP.

## 2013-04-24 NOTE — Assessment & Plan Note (Signed)
Anxiety, depression, See history of present illness, having some family issues,  emotional support provided today. We talk about see a counselor, medications. She is open to see a Social worker  . Declined medication. Denies any suicidal ideas.

## 2013-04-26 NOTE — Addendum Note (Signed)
Addended by: Peggyann Shoals on: 04/26/2013 04:27 PM   Modules accepted: Orders

## 2013-04-27 DIAGNOSIS — N390 Urinary tract infection, site not specified: Secondary | ICD-10-CM | POA: Diagnosis not present

## 2013-04-27 NOTE — Addendum Note (Signed)
Addended by: Modena Morrow D on: 04/27/2013 04:49 PM   Modules accepted: Orders

## 2013-04-29 LAB — URINE CULTURE

## 2013-04-30 ENCOUNTER — Encounter: Payer: Self-pay | Admitting: *Deleted

## 2013-05-11 ENCOUNTER — Telehealth: Payer: Self-pay

## 2013-05-11 NOTE — Telephone Encounter (Signed)
Relevant patient education mailed to patient.  

## 2013-05-22 ENCOUNTER — Encounter: Payer: Self-pay | Admitting: Internal Medicine

## 2013-05-22 ENCOUNTER — Ambulatory Visit (INDEPENDENT_AMBULATORY_CARE_PROVIDER_SITE_OTHER): Payer: Medicare Other | Admitting: Internal Medicine

## 2013-05-22 VITALS — BP 106/48 | HR 67 | Temp 97.9°F | Wt 205.0 lb

## 2013-05-22 DIAGNOSIS — E119 Type 2 diabetes mellitus without complications: Secondary | ICD-10-CM | POA: Diagnosis not present

## 2013-05-22 DIAGNOSIS — I10 Essential (primary) hypertension: Secondary | ICD-10-CM | POA: Diagnosis not present

## 2013-05-22 DIAGNOSIS — F329 Major depressive disorder, single episode, unspecified: Secondary | ICD-10-CM | POA: Diagnosis not present

## 2013-05-22 DIAGNOSIS — F3289 Other specified depressive episodes: Secondary | ICD-10-CM | POA: Diagnosis not present

## 2013-05-22 NOTE — Progress Notes (Signed)
Pre visit review using our clinic review tool, if applicable. No additional management support is needed unless otherwise documented below in the visit note. 

## 2013-05-22 NOTE — Assessment & Plan Note (Signed)
Diabetes, last A1c very good, continue with current medications, does not take amb CBGs.

## 2013-05-22 NOTE — Progress Notes (Signed)
Subjective:    Patient ID: Rebecca Elliott, female    DOB: 07-Aug-1926, 80 y.o.   MRN: [id number]  DOS:  05/22/2013 Type of  visit: From previous visit Hypertension, metoprolol was increased, ambulatory BPs 140, 150. Today  her BP is very good. Anxiety depression, did not get to see a counselor but she has been doing a lot of thinking, she feels very good. Diabetes, labs reviewed, well-controlled, good compliance of medication.   ROS Denies chest pain or difficulty breathing No dysuria, gross hematuria, difficulty urinating. No fever chills.   Past Medical History  Diagnosis Date  . Hypertension   . Diabetes mellitus     as an adult  . Hyperlipidemia   . Depression   . COPD  and ASTHMA   . DJD (degenerative joint disease)   . GERD (gastroesophageal reflux disease)     and HH w/ reflux  . History of colonic polyps   . Allergic rhinitis     Past Surgical History  Procedure Laterality Date  . Appendectomy    . Cataract extraction, bilateral    . Cholecystectomy    . Tubal ligation    . Tonsillectomy    . Abdominal hysterectomy      apparently no oophorectomy  . Bilateral breast tumor      History   Social History  . Marital Status: Widowed    Spouse Name: N/A    Number of Children: 30  . Years of Education: N/A   Occupational History  . retired     Social History Main Topics  . Smoking status: Former Research scientist (life sciences)  . Smokeless tobacco: Never Used  . Alcohol Use: No  . Drug Use: No  . Sexual Activity: Not on file   Other Topics Concern  . Not on file   Social History Narrative   widow (1994), children 5 (lost 3 sons,2 alive, daughters)   Daughter lives w/ her, has another daugther            Medication List       This list is accurate as of: 05/22/13  9:55 PM.  Always use your most recent med list.               albuterol 108 (90 BASE) MCG/ACT inhaler  Commonly known as:  VENTOLIN HFA  Inhale 2 puffs into the lungs every 6 (six) hours as needed.     amLODipine 10 MG tablet  Commonly known as:  NORVASC  TAKE 1 TABLET DAILY     aspirin 81 MG tablet  Take 81 mg by mouth daily.     atorvastatin 10 MG tablet  Commonly known as:  LIPITOR  TAKE 1 TABLET DAILY     BENEFIBER DRINK MIX PO  Take by mouth 2 (two) times daily.     CENTRUM SILVER Chew  Chew by mouth.     dextromethorphan-guaiFENesin 30-600 MG per 12 hr tablet  Commonly known as:  MUCINEX DM  Take 1 tablet by mouth daily.     docusate sodium 100 MG capsule  Commonly known as:  COLACE  Take 1-2 by mouth once a day.     loratadine 10 MG tablet  Commonly known as:  CLARITIN  Take 10 mg by mouth daily.     losartan-hydrochlorothiazide 100-12.5 MG per tablet  Commonly known as:  HYZAAR  TAKE 1/2 TABLET DAILY     metFORMIN 500 MG tablet  Commonly known as:  GLUCOPHAGE  TAKE 2 TABLETS (=1000MG )  DAILY WITH BREAKFAST     metoprolol succinate 50 MG 24 hr tablet  Commonly known as:  TOPROL XL  Take 1.5 tablets a day     pantoprazole 40 MG tablet  Commonly known as:  PROTONIX  TAKE 1 TABLET DAILY     sitaGLIPtin 100 MG tablet  Commonly known as:  JANUVIA  Take 1 tablet (100 mg total) by mouth daily.     SPIRIVA HANDIHALER 18 MCG inhalation capsule  Generic drug:  tiotropium  INHALE THE CONTENTS OF ONE CAPSULE DAILY (PLACE       CAPSULE INTO INHALER)           Objective:   Physical Exam BP 106/48  Pulse 67  Temp(Src) 97.9 F (36.6 C)  Wt 205 lb (92.987 kg)  SpO2 92% General -- alert, well-developed, NAD.  Lungs -- normal respiratory effort, no intercostal retractions, no accessory muscle use, and normal breath sounds.  Heart-- normal rate, regular rhythm, no murmur.  Extremities-- no pretibial edema bilaterally  Psych-- Cognition and judgment appear intact. Cooperative with normal attention span and concentration. No anxious or depressed appearing.          Assessment & Plan:   Dysuria, urine culture inconclusive, now  asymptomatic.

## 2013-05-22 NOTE — Assessment & Plan Note (Signed)
Hypertension, BP meds adjusted, BP  Slightly low  today, she feels well, no change.

## 2013-05-22 NOTE — Patient Instructions (Signed)
Next visit is for routine check up regards your blood sugar , blood pressure in 4 months ,  fasting Please make an appointment

## 2013-05-22 NOTE — Assessment & Plan Note (Signed)
Since the last visit,   didn't get to see a counselor but she feels great. Emotional support provided today

## 2013-05-25 ENCOUNTER — Ambulatory Visit: Payer: Medicare Other | Admitting: Internal Medicine

## 2013-06-19 ENCOUNTER — Other Ambulatory Visit: Payer: Self-pay | Admitting: Internal Medicine

## 2013-07-13 ENCOUNTER — Other Ambulatory Visit: Payer: Self-pay | Admitting: Internal Medicine

## 2013-07-18 ENCOUNTER — Ambulatory Visit: Payer: Medicare Other | Admitting: Internal Medicine

## 2013-08-19 ENCOUNTER — Other Ambulatory Visit: Payer: Self-pay | Admitting: Internal Medicine

## 2013-08-27 ENCOUNTER — Other Ambulatory Visit: Payer: Self-pay | Admitting: Internal Medicine

## 2013-09-12 DIAGNOSIS — E119 Type 2 diabetes mellitus without complications: Secondary | ICD-10-CM | POA: Diagnosis not present

## 2013-09-12 DIAGNOSIS — Z961 Presence of intraocular lens: Secondary | ICD-10-CM | POA: Diagnosis not present

## 2013-09-12 DIAGNOSIS — H04129 Dry eye syndrome of unspecified lacrimal gland: Secondary | ICD-10-CM | POA: Diagnosis not present

## 2013-09-12 DIAGNOSIS — H26499 Other secondary cataract, unspecified eye: Secondary | ICD-10-CM | POA: Diagnosis not present

## 2013-09-12 DIAGNOSIS — H1045 Other chronic allergic conjunctivitis: Secondary | ICD-10-CM | POA: Diagnosis not present

## 2013-09-12 LAB — HM DIABETES EYE EXAM

## 2013-09-26 ENCOUNTER — Ambulatory Visit (INDEPENDENT_AMBULATORY_CARE_PROVIDER_SITE_OTHER): Payer: Medicare Other | Admitting: Internal Medicine

## 2013-09-26 ENCOUNTER — Encounter: Payer: Self-pay | Admitting: Internal Medicine

## 2013-09-26 VITALS — BP 112/58 | HR 72 | Temp 97.8°F | Wt 199.1 lb

## 2013-09-26 DIAGNOSIS — R259 Unspecified abnormal involuntary movements: Secondary | ICD-10-CM

## 2013-09-26 DIAGNOSIS — Z23 Encounter for immunization: Secondary | ICD-10-CM | POA: Diagnosis not present

## 2013-09-26 DIAGNOSIS — E785 Hyperlipidemia, unspecified: Secondary | ICD-10-CM | POA: Diagnosis not present

## 2013-09-26 DIAGNOSIS — E119 Type 2 diabetes mellitus without complications: Secondary | ICD-10-CM | POA: Diagnosis not present

## 2013-09-26 DIAGNOSIS — R251 Tremor, unspecified: Secondary | ICD-10-CM | POA: Insufficient documentation

## 2013-09-26 DIAGNOSIS — I10 Essential (primary) hypertension: Secondary | ICD-10-CM

## 2013-09-26 LAB — LIPID PANEL
CHOLESTEROL: 123 mg/dL (ref 0–200)
HDL: 30.3 mg/dL — ABNORMAL LOW (ref >39.00)
LDL Cholesterol: 64 mg/dL (ref 0–99)
NONHDL: 92.7
Total CHOL/HDL Ratio: 4
Triglycerides: 146 mg/dL (ref 0.0–149.0)
VLDL: 29.2 mg/dL (ref 0.0–40.0)

## 2013-09-26 LAB — BASIC METABOLIC PANEL
BUN: 26 mg/dL — ABNORMAL HIGH (ref 6–23)
CALCIUM: 9 mg/dL (ref 8.4–10.5)
CO2: 28 mEq/L (ref 19–32)
Chloride: 100 mEq/L (ref 96–112)
Creatinine, Ser: 1.3 mg/dL — ABNORMAL HIGH (ref 0.4–1.2)
GFR: 42.83 mL/min — ABNORMAL LOW (ref >60.00)
GLUCOSE: 119 mg/dL — AB (ref 70–99)
Potassium: 4 mEq/L (ref 3.5–5.1)
SODIUM: 138 meq/L (ref 135–145)

## 2013-09-26 LAB — HEMOGLOBIN A1C: HEMOGLOBIN A1C: 6.7 % — AB (ref 4.6–6.5)

## 2013-09-26 NOTE — Progress Notes (Signed)
Pre visit review using our clinic review tool, if applicable. No additional management support is needed unless otherwise documented below in the visit note. 

## 2013-09-26 NOTE — Assessment & Plan Note (Signed)
Doing great with diet, has lost 6 pounds, check A1c

## 2013-09-26 NOTE — Assessment & Plan Note (Signed)
well-controlled, she was supposed to take losartan HCT half tablet daily (d/t increased creat) but has been taking one tablet every day. Plan: BMP, if creatinine is again elevated we'll go back to half losartan .

## 2013-09-26 NOTE — Progress Notes (Signed)
Subjective:    Patient ID: Rebecca Elliott, female    DOB: 1926/05/08, 80 y.o.   MRN: [id number]  DOS:  09/26/2013 Type of visit - description : routine Interval history: Diabetes, doing great with diet, has lost 6 pounds, she remains active; No ambulatory CBGs Hypertension, she was supposed to take half losartan HCT but she has been taking one tablet for a while. BP today is excellent. High cholesterol, good medication compliance, due for labs. Has noted some mild tremor on the right hand, not bothersome at all.   ROS Denies chest pain or lower extremity edema. No difficulty breathing Denies any headaches, neck pain, no focal deficits  Past Medical History  Diagnosis Date  . Hypertension   . Diabetes mellitus     as an adult  . Hyperlipidemia   . Depression   . COPD  and ASTHMA   . DJD (degenerative joint disease)   . GERD (gastroesophageal reflux disease)     and HH w/ reflux  . History of colonic polyps   . Allergic rhinitis     Past Surgical History  Procedure Laterality Date  . Appendectomy    . Cataract extraction, bilateral    . Cholecystectomy    . Tubal ligation    . Tonsillectomy    . Abdominal hysterectomy      apparently no oophorectomy  . Bilateral breast tumor      History   Social History  . Marital Status: Widowed    Spouse Name: N/A    Number of Children: 2  . Years of Education: N/A   Occupational History  . retired     Social History Main Topics  . Smoking status: Former Research scientist (life sciences)  . Smokeless tobacco: Never Used  . Alcohol Use: No  . Drug Use: No  . Sexual Activity: Not on file   Other Topics Concern  . Not on file   Social History Narrative   widow (1994), children 5 (lost 3 sons,2 alive, daughters)   Daughter lives w/ her, has another daugther            Medication List       This list is accurate as of: 09/26/13 11:59 PM.  Always use your most recent med list.               albuterol 108 (90 BASE) MCG/ACT inhaler    Commonly known as:  VENTOLIN HFA  Inhale 2 puffs into the lungs every 6 (six) hours as needed.     amLODipine 10 MG tablet  Commonly known as:  NORVASC  TAKE 1 TABLET DAILY     aspirin 81 MG tablet  Take 81 mg by mouth daily.     atorvastatin 10 MG tablet  Commonly known as:  LIPITOR  TAKE 1 TABLET DAILY     BENEFIBER DRINK MIX PO  Take by mouth 2 (two) times daily.     CENTRUM SILVER Chew  Chew by mouth.     dextromethorphan-guaiFENesin 30-600 MG per 12 hr tablet  Commonly known as:  MUCINEX DM  Take 1 tablet by mouth daily.     docusate sodium 100 MG capsule  Commonly known as:  COLACE  Take 1-2 by mouth once a day.     loratadine 10 MG tablet  Commonly known as:  CLARITIN  Take 10 mg by mouth daily.     losartan-hydrochlorothiazide 100-12.5 MG per tablet  Commonly known as:  HYZAAR  TAKE 1 TABLET DAILY  metFORMIN 500 MG tablet  Commonly known as:  GLUCOPHAGE  TAKE 2 TABLETS (=1000MG )   DAILY WITH BREAKFAST     metoprolol succinate 25 MG 24 hr tablet  Commonly known as:  TOPROL-XL  TAKE 1 AND 1/2 TABLETS     DAILY     pantoprazole 40 MG tablet  Commonly known as:  PROTONIX  TAKE 1 TABLET DAILY     sitaGLIPtin 100 MG tablet  Commonly known as:  JANUVIA  Take 1 tablet (100 mg total) by mouth daily.     SPIRIVA HANDIHALER 18 MCG inhalation capsule  Generic drug:  tiotropium  INHALE THE CONTENTS OF ONE CAPSULE DAILY (PLACE       CAPSULE INTO INHALER)           Objective:   Physical Exam BP 112/58  Pulse 72  Temp(Src) 97.8 F (36.6 C) (Oral)  Wt 199 lb 2 oz (90.323 kg)  SpO2 97%  General -- alert, well-developed, NAD.  HEENT-- Not pale.  Lungs -- normal respiratory effort, no intercostal retractions, no accessory muscle use, and normal breath sounds.  Heart-- normal rate, regular rhythm, no murmur.   Extremities-- no pretibial edema bilaterally  Neurologic--  alert & oriented X3. Speech normal, gait appropriate for age, strength symmetric  and appropriate for age.  DTRs symmetric. Very mild tremor , right hand   Psych-- Cognition and judgment appear intact. Cooperative with normal attention span and concentration. No anxious or depressed appearing.     Assessment & Plan:

## 2013-09-26 NOTE — Assessment & Plan Note (Addendum)
Good compliance w/  medication, check labs

## 2013-09-26 NOTE — Patient Instructions (Signed)
Get your blood work before you leave    Please come back to the office in 3-4 months for a routine  physical exam, no   fasting Stop by the front desk and schedule the visit

## 2013-09-26 NOTE — Assessment & Plan Note (Addendum)
Mild right hand tremor, neuro exam is otherwise nonfocal, for now recommend observation

## 2013-10-01 ENCOUNTER — Other Ambulatory Visit: Payer: Self-pay | Admitting: Internal Medicine

## 2013-10-05 ENCOUNTER — Other Ambulatory Visit: Payer: Self-pay | Admitting: Internal Medicine

## 2013-10-12 ENCOUNTER — Other Ambulatory Visit: Payer: Self-pay | Admitting: Internal Medicine

## 2013-10-21 ENCOUNTER — Other Ambulatory Visit: Payer: Self-pay | Admitting: Internal Medicine

## 2013-10-28 ENCOUNTER — Other Ambulatory Visit: Payer: Self-pay | Admitting: Internal Medicine

## 2013-11-07 ENCOUNTER — Telehealth: Payer: Self-pay | Admitting: Internal Medicine

## 2013-11-07 MED ORDER — TIOTROPIUM BROMIDE MONOHYDRATE 18 MCG IN CAPS: ORAL_CAPSULE | RESPIRATORY_TRACT | Status: DC

## 2013-11-07 NOTE — Telephone Encounter (Signed)
Spiriva refilled to [facility].

## 2013-11-07 NOTE — Telephone Encounter (Signed)
Caller name:Ashunti Guillot Relation to PO:[id number] Call back number:[phone] Pharmacy:[facility]  Reason for call: pt is needing new rx SPIRIVA HANDIHALER 18 MCG inhalation capsule

## 2013-12-10 ENCOUNTER — Other Ambulatory Visit: Payer: Self-pay

## 2013-12-18 ENCOUNTER — Other Ambulatory Visit: Payer: Self-pay | Admitting: Internal Medicine

## 2013-12-18 LAB — HM DIABETES FOOT EXAM: HM DIABETIC FOOT EXAM: NORMAL

## 2013-12-24 ENCOUNTER — Other Ambulatory Visit: Payer: Self-pay

## 2014-01-30 ENCOUNTER — Ambulatory Visit (INDEPENDENT_AMBULATORY_CARE_PROVIDER_SITE_OTHER): Payer: Medicare Other | Admitting: Internal Medicine

## 2014-01-30 ENCOUNTER — Encounter: Payer: Self-pay | Admitting: Internal Medicine

## 2014-01-30 VITALS — BP 108/58 | HR 78 | Temp 97.6°F | Ht 66.0 in | Wt 200.2 lb

## 2014-01-30 DIAGNOSIS — D72829 Elevated white blood cell count, unspecified: Secondary | ICD-10-CM

## 2014-01-30 DIAGNOSIS — Z23 Encounter for immunization: Secondary | ICD-10-CM | POA: Diagnosis not present

## 2014-01-30 DIAGNOSIS — E119 Type 2 diabetes mellitus without complications: Secondary | ICD-10-CM | POA: Diagnosis not present

## 2014-01-30 DIAGNOSIS — R945 Abnormal results of liver function studies: Secondary | ICD-10-CM

## 2014-01-30 DIAGNOSIS — R7989 Other specified abnormal findings of blood chemistry: Secondary | ICD-10-CM

## 2014-01-30 DIAGNOSIS — I10 Essential (primary) hypertension: Secondary | ICD-10-CM

## 2014-01-30 DIAGNOSIS — Z0000 Encounter for general adult medical examination without abnormal findings: Secondary | ICD-10-CM | POA: Diagnosis not present

## 2014-01-30 DIAGNOSIS — J410 Simple chronic bronchitis: Secondary | ICD-10-CM

## 2014-01-30 LAB — BASIC METABOLIC PANEL
BUN: 24 mg/dL — ABNORMAL HIGH (ref 6–23)
CALCIUM: 8.9 mg/dL (ref 8.4–10.5)
CO2: 30 mEq/L (ref 19–32)
CREATININE: 1.31 mg/dL — AB (ref 0.40–1.20)
Chloride: 103 mEq/L (ref 96–112)
GFR: 40.92 mL/min — ABNORMAL LOW (ref >60.00)
GLUCOSE: 134 mg/dL — AB (ref 70–99)
Potassium: 3.5 mEq/L (ref 3.5–5.1)
Sodium: 138 mEq/L (ref 135–145)

## 2014-01-30 LAB — CBC WITH DIFFERENTIAL/PLATELET
Basophils Absolute: 0.1 10*3/uL (ref 0.0–0.1)
Basophils Relative: 0.4 % (ref 0.0–3.0)
EOS ABS: 0.2 10*3/uL (ref 0.0–0.7)
Eosinophils Relative: 1.2 % (ref 0.0–5.0)
HCT: 36.3 % (ref 36.0–46.0)
Hemoglobin: 11.5 g/dL — ABNORMAL LOW (ref 12.0–15.0)
Lymphocytes Relative: 18.6 % (ref 12.0–46.0)
Lymphs Abs: 2.5 10*3/uL (ref 0.7–4.0)
MCHC: 31.6 g/dL (ref 30.0–36.0)
MCV: 77.7 fl — ABNORMAL LOW (ref 78.0–100.0)
MONO ABS: 1 10*3/uL (ref 0.1–1.0)
Monocytes Relative: 7.3 % (ref 3.0–12.0)
NEUTROS PCT: 72.5 % (ref 43.0–77.0)
Neutro Abs: 9.6 10*3/uL — ABNORMAL HIGH (ref 1.4–7.7)
PLATELETS: 295 10*3/uL (ref 150.0–400.0)
RBC: 4.68 Mil/uL (ref 3.87–5.11)
RDW: 15.2 % (ref 11.5–15.5)
WBC: 13.2 10*3/uL — ABNORMAL HIGH (ref 4.0–10.5)

## 2014-01-30 LAB — AST: AST: 35 U/L (ref 0–37)

## 2014-01-30 LAB — ALT: ALT: 23 U/L (ref 0–35)

## 2014-01-30 LAB — HEMOGLOBIN A1C: HEMOGLOBIN A1C: 6.9 % — AB (ref 4.6–6.5)

## 2014-01-30 NOTE — Assessment & Plan Note (Signed)
Recheck LFTs today, chart reviewed, no previous workup for elevated LFTs.

## 2014-01-30 NOTE — Assessment & Plan Note (Signed)
Continue with Januvia and metformin, check labs

## 2014-01-30 NOTE — Progress Notes (Signed)
Subjective:    Patient ID: Rebecca Elliott, female    DOB: August 09, 1926, 81 y.o.   MRN: [id number]  DOS:  01/30/2014 Type of visit - description :   Here for Medicare AWV: 1. Risk factors based on Past M, S, F history: reviewed 2. Physical Activities:  no routine exercise , less active than last year---> moved to the first floor of her house 3. Depression/mood:  Neg screening, occ feels down but nothing serious or persisting 4. Hearing: some decrease hearing, not worse,  declines referral 5. ADL's: totally independent, still drives   6. Fall Risk: no recent falls, prevention discussed   7. home Safety: does feel safe at home   8. Height, weight, &visual acuity: see VS, sees eye doctor routinely  9. Counseling: provided 10. Labs ordered based on risk factors: if needed   11. Referral Coordination: if needed 12.  Care Plan, see assessment and plan   13.   Cognitive Assessment: motor and cognition seem appropriate for age  65. Team care updated 15. Written plan of care provided  In addition, today we discussed the following: Hypertension, good medication compliance, not ambulatory BPs, BP today is in the low side, she is asymptomatic COPD, asthma: Good compliance with medications, uses Ventolin on average one time a week. Diabetes, good compliance of medication, not ambulatory CBGs High cholesterol, on Lipitor, no apparent side effects   ROS Denies chest pain, difficulty breathing No nausea, vomiting, diarrhea or blood in the stools No hemoptysis No dysuria, gross hematuria, difficulty urinating. No vaginal discharge or bleeding  Past Medical History  Diagnosis Date  . Hypertension   . Diabetes mellitus     as an adult  . Hyperlipidemia   . Depression   . COPD  and ASTHMA   . DJD (degenerative joint disease)   . GERD (gastroesophageal reflux disease)     and HH w/ reflux  . History of colonic polyps   . Allergic rhinitis     Past Surgical History  Procedure Laterality  Date  . Appendectomy    . Cataract extraction, bilateral    . Cholecystectomy    . Tubal ligation    . Tonsillectomy    . Abdominal hysterectomy      apparently no oophorectomy  . Bilateral breast tumor      History   Social History  . Marital Status: Widowed    Spouse Name: N/A    Number of Children: 72  . Years of Education: N/A   Occupational History  . retired     Social History Main Topics  . Smoking status: Former Research scientist (life sciences)  . Smokeless tobacco: Never Used  . Alcohol Use: No  . Drug Use: No  . Sexual Activity: Not on file   Other Topics Concern  . Not on file   Social History Narrative   widow (1994), children 5 (lost 3 sons,2 alive, daughters)   2 daughter lives w/ her, both single       Family History  Problem Relation Age of Onset  . Breast cancer Sister     x 2  . Colon cancer Neg Hx   . Diabetes Father   . Diabetes Sister   . Heart attack Brother     early 57s  . Heart attack Son   . Pancreatic cancer Son   . Stomach cancer Sister        Medication List       This list is accurate as of:  01/30/14  6:05 PM.  Always use your most recent med list.               albuterol 108 (90 BASE) MCG/ACT inhaler  Commonly known as:  VENTOLIN HFA  Inhale 2 puffs into the lungs every 6 (six) hours as needed.     amLODipine 10 MG tablet  Commonly known as:  NORVASC  TAKE 1 TABLET DAILY     aspirin 81 MG tablet  Take 81 mg by mouth daily.     atorvastatin 10 MG tablet  Commonly known as:  LIPITOR  TAKE 1 TABLET DAILY     BENEFIBER DRINK MIX PO  Take by mouth 2 (two) times daily.     CENTRUM SILVER Chew  Chew by mouth.     dextromethorphan-guaiFENesin 30-600 MG per 12 hr tablet  Commonly known as:  MUCINEX DM  Take 1 tablet by mouth daily.     docusate sodium 100 MG capsule  Commonly known as:  COLACE  Take 1-2 by mouth once a day.     JANUVIA 100 MG tablet  Generic drug:  sitaGLIPtin  TAKE 1 TABLET DAILY     loratadine 10 MG tablet    Commonly known as:  CLARITIN  Take 10 mg by mouth daily.     losartan-hydrochlorothiazide 100-12.5 MG per tablet  Commonly known as:  HYZAAR  TAKE 1 TABLET DAILY     metFORMIN 500 MG tablet  Commonly known as:  GLUCOPHAGE  TAKE 2 TABLETS (=1000MG )   DAILY WITH BREAKFAST     metoprolol succinate 25 MG 24 hr tablet  Commonly known as:  TOPROL-XL  TAKE 1 AND 1/2 TABLETS     DAILY     pantoprazole 40 MG tablet  Commonly known as:  PROTONIX  TAKE 1 TABLET DAILY     tiotropium 18 MCG inhalation capsule  Commonly known as:  SPIRIVA HANDIHALER  INHALE THE CONTENTS OF ONE CAPSULE DAILY (PLACE CAPSULE INTO INHALER)           Objective:   Physical Exam BP 108/58 mmHg  Pulse 78  Temp(Src) 97.6 F (36.4 C) (Oral)  Ht 5\' 6"  (1.676 m)  Wt 200 lb 4 oz (90.833 kg)  BMI 32.34 kg/m2  SpO2 94% General -- alert, well-developed, NAD.  Neck --no thyromegaly  HEENT-- Not pale.   Breast-- no dominant mass, skin and nipples normal to inspection on palpation, axillary areas without mass or lymphadenopathy Lungs -- normal respiratory effort, no intercostal retractions, no accessory muscle use, and normal breath sounds.  Heart-- normal rate, regular rhythm, no murmur.  Abdomen-- Not distended, good bowel sounds,soft, non-tender. No bruit  Extremities-- no pretibial edema bilaterally  Neurologic--  alert & oriented X3. Speech normal, gait appropriate for age, strength symmetric and appropriate for age.  Psych-- Cognition and judgment appear intact. Cooperative with normal attention span and concentration. No anxious or depressed appearing.     Assessment & Plan:

## 2014-01-30 NOTE — Assessment & Plan Note (Signed)
BP upon arrival to the office was 88 /55, recheck: 108/58. She remained asymptomatic. Last creatinine slightly elevated  Plan: BMP, check ambulatory BPs, continue with metoprolol, Hyzaar, amlodipine. Adjust medication if ambulatory BPs low

## 2014-01-30 NOTE — Assessment & Plan Note (Signed)
prevnar today, continue Spiriva, continue albuterol which she uses sporadically

## 2014-01-30 NOTE — Progress Notes (Signed)
Pre visit review using our clinic review tool, if applicable. No additional management support is needed unless otherwise documented below in the visit note. 

## 2014-01-30 NOTE — Assessment & Plan Note (Addendum)
Td 2012 pneumonia shot 2010 prevnar-- today shingles shot-- 2011 Had a Flu shot    81 year old lady 2 sisters w/ breast cancer, doing well. Breast exam today is neg, SBE (-) Last mammogram 2012, did not pursue a mammogram 2015, we agreed to proceed with a mammogram. If she continued to do well she can consider  additional screenings.  No further Paps  Cscope 2004 : tics, hemorrhoids  had rectal bleeding and anemia, colonoscopyt 08/2008 : tics, no polyps   bone density test 10-2006 and 6-2011normal, vitamin D normal 2011  Diet-exercise discussed

## 2014-01-30 NOTE — Patient Instructions (Signed)
Get your blood work before you leave   Check the  blood pressure 2 or 3 times a  Week  Be sure your blood pressure is between 110/65 and  145/85.  if it is consistently higher or lower, let me know     Please come back to the office in 4 months for a routine check up      Fall Prevention and Ovid cause injuries and can affect all age groups. It is possible to use preventive measures to significantly decrease the likelihood of falls. There are many simple measures which can make your home safer and prevent falls. OUTDOORS  Repair cracks and edges of walkways and driveways.  Remove high doorway thresholds.  Trim shrubbery on the main path into your home.  Have good outside lighting.  Clear walkways of tools, rocks, debris, and clutter.  Check that handrails are not broken and are securely fastened. Both sides of steps should have handrails.  Have leaves, snow, and ice cleared regularly.  Use sand or salt on walkways during winter months.  In the garage, clean up grease or oil spills. BATHROOM  Install night lights.  Install grab bars by the toilet and in the tub and shower.  Use non-skid mats or decals in the tub or shower.  Place a plastic non-slip stool in the shower to sit on, if needed.  Keep floors dry and clean up all water on the floor immediately.  Remove soap buildup in the tub or shower on a regular basis.  Secure bath mats with non-slip, double-sided rug tape.  Remove throw rugs and tripping hazards from the floors. BEDROOMS  Install night lights.  Make sure a bedside light is easy to reach.  Do not use oversized bedding.  Keep a telephone by your bedside.  Have a firm chair with side arms to use for getting dressed.  Remove throw rugs and tripping hazards from the floor. KITCHEN  Keep handles on pots and pans turned toward the center of the stove. Use back burners when possible.  Clean up spills quickly and allow time for  drying.  Avoid walking on wet floors.  Avoid hot utensils and knives.  Position shelves so they are not too high or low.  Place commonly used objects within easy reach.  If necessary, use a sturdy step stool with a grab bar when reaching.  Keep electrical cables out of the way.  Do not use floor polish or wax that makes floors slippery. If you must use wax, use non-skid floor wax.  Remove throw rugs and tripping hazards from the floor. STAIRWAYS  Never leave objects on stairs.  Place handrails on both sides of stairways and use them. Fix any loose handrails. Make sure handrails on both sides of the stairways are as long as the stairs.  Check carpeting to make sure it is firmly attached along stairs. Make repairs to worn or loose carpet promptly.  Avoid placing throw rugs at the top or bottom of stairways, or properly secure the rug with carpet tape to prevent slippage. Get rid of throw rugs, if possible.  Have an electrician put in a light switch at the top and bottom of the stairs. OTHER FALL PREVENTION TIPS  Wear low-heel or rubber-soled shoes that are supportive and fit well. Wear closed toe shoes.  When using a stepladder, make sure it is fully opened and both spreaders are firmly locked. Do not climb a closed stepladder.  Add color or contrast  paint or tape to grab bars and handrails in your home. Place contrasting color strips on first and last steps.  Learn and use mobility aids as needed. Install an electrical emergency response system.  Turn on lights to avoid dark areas. Replace light bulbs that burn out immediately. Get light switches that glow.  Arrange furniture to create clear pathways. Keep furniture in the same place.  Firmly attach carpet with non-skid or double-sided tape.  Eliminate uneven floor surfaces.  Select a carpet pattern that does not visually hide the edge of steps.  Be aware of all pets. OTHER HOME SAFETY TIPS  Set the water temperature  for 120 F (48.8 C).  Keep emergency numbers on or near the telephone.  Keep smoke detectors on every level of the home and near sleeping areas. Document Released: 12/25/2001 Document Revised: 07/06/2011 Document Reviewed: 03/26/2011 [facility] Patient Information 2015 [place], [place]. This information is not intended to replace advice given to you by your health care provider. Make sure you discuss any questions you have with your health care provider.    Preventive Care for Adults    Ages 58 years and over  Blood pressure check.** / Every 1 to 2 years.  Lipid and cholesterol check.** / Every 5 years beginning at age 58 years.  Lung cancer screening. / Every year if you are aged 71-80 years and have a 30-pack-year history of smoking and currently smoke or have quit within the past 15 years. Yearly screening is stopped once you have quit smoking for at least 15 years or develop a health problem that would prevent you from having lung cancer treatment.  Clinical breast exam.** / Every year after age 68 years.  BRCA-related cancer risk assessment.** / For women who have family members with a BRCA-related cancer (breast, ovarian, tubal, or peritoneal cancers).  Mammogram.** / Every year beginning at age 78 years and continuing for as long as you are in good health. Consult with your health care provider.  Pap test.** / Every 3 years starting at age 70 years through age 24 or 52 years with 3 consecutive normal Pap tests. Testing can be stopped between 65 and 70 years with 3 consecutive normal Pap tests and no abnormal Pap or HPV tests in the past 10 years.  HPV screening.** / Every 3 years from ages 46 years through ages 39 or 36 years with a history of 3 consecutive normal Pap tests. Testing can be stopped between 65 and 70 years with 3 consecutive normal Pap tests and no abnormal Pap or HPV tests in the past 10 years.  Fecal occult blood test (FOBT) of stool. / Every year beginning at  age 41 years and continuing until age 85 years. You may not need to do this test if you get a colonoscopy every 10 years.  Flexible sigmoidoscopy or colonoscopy.** / Every 5 years for a flexible sigmoidoscopy or every 10 years for a colonoscopy beginning at age 65 years and continuing until age 75 years.  Hepatitis C blood test.** / For all people born from [birth year] through 1965 and any individual with known risks for hepatitis C.  Osteoporosis screening.** / A one-time screening for women ages 63 years and over and women at risk for fractures or osteoporosis.  Skin self-exam. / Monthly.  Influenza vaccine. / Every year.  Tetanus, diphtheria, and acellular pertussis (Tdap/Td) vaccine.** / 1 dose of Td every 10 years.  Varicella vaccine.** / Consult your health care provider.  Zoster vaccine.** /  1 dose for adults aged 47 years or older.  Pneumococcal 13-valent conjugate (PCV13) vaccine.** / Consult your health care provider.  Pneumococcal polysaccharide (PPSV23) vaccine.** / 1 dose for all adults aged 47 years and older.  Meningococcal vaccine.** / Consult your health care provider.  Hepatitis A vaccine.** / Consult your health care provider.  Hepatitis B vaccine.** / Consult your health care provider.  Haemophilus influenzae type b (Hib) vaccine.** / Consult your health care provider. ** Family history and personal history of risk and conditions may change your health care provider's recommendations. Document Released: 03/02/2001 Document Revised: 05/21/2013 Document Reviewed: 06/01/2010 [facility] Patient Information 2015 [place], [place]. This information is not intended to replace advice given to you by your health care provider. Make sure you discuss any questions you have with your health care provider.

## 2014-02-04 NOTE — Addendum Note (Signed)
Addended by: Wilfrid Lund on: 02/04/2014 11:28 AM   Modules accepted: Orders

## 2014-02-06 ENCOUNTER — Encounter: Payer: Self-pay | Admitting: Internal Medicine

## 2014-02-15 ENCOUNTER — Other Ambulatory Visit: Payer: Self-pay | Admitting: Internal Medicine

## 2014-02-15 DIAGNOSIS — Z1231 Encounter for screening mammogram for malignant neoplasm of breast: Secondary | ICD-10-CM

## 2014-02-19 ENCOUNTER — Other Ambulatory Visit (INDEPENDENT_AMBULATORY_CARE_PROVIDER_SITE_OTHER): Payer: Medicare Other

## 2014-02-19 DIAGNOSIS — D72829 Elevated white blood cell count, unspecified: Secondary | ICD-10-CM | POA: Diagnosis not present

## 2014-02-19 LAB — CBC WITH DIFFERENTIAL/PLATELET
Basophils Absolute: 0.1 10*3/uL (ref 0.0–0.1)
Basophils Relative: 0.6 % (ref 0.0–3.0)
Eosinophils Absolute: 0.2 10*3/uL (ref 0.0–0.7)
Eosinophils Relative: 1.8 % (ref 0.0–5.0)
HCT: 33.8 % — ABNORMAL LOW (ref 36.0–46.0)
Hemoglobin: 10.8 g/dL — ABNORMAL LOW (ref 12.0–15.0)
LYMPHS ABS: 3.7 10*3/uL (ref 0.7–4.0)
Lymphocytes Relative: 31.7 % (ref 12.0–46.0)
MCHC: 32 g/dL (ref 30.0–36.0)
MCV: 76.4 fl — AB (ref 78.0–100.0)
Monocytes Absolute: 1 10*3/uL (ref 0.1–1.0)
Monocytes Relative: 8.6 % (ref 3.0–12.0)
NEUTROS PCT: 57.3 % (ref 43.0–77.0)
Neutro Abs: 6.8 10*3/uL (ref 1.4–7.7)
PLATELETS: 348 10*3/uL (ref 150.0–400.0)
RBC: 4.42 Mil/uL (ref 3.87–5.11)
RDW: 15.2 % (ref 11.5–15.5)
WBC: 11.8 10*3/uL — ABNORMAL HIGH (ref 4.0–10.5)

## 2014-02-26 ENCOUNTER — Ambulatory Visit
Admission: RE | Admit: 2014-02-26 | Discharge: 2014-02-26 | Disposition: A | Discharge status: Home / Self Care | Payer: Medicare Other | Source: Ambulatory Visit | Attending: Internal Medicine | Admitting: Internal Medicine

## 2014-02-26 ENCOUNTER — Ambulatory Visit: Payer: Medicare Other

## 2014-02-26 DIAGNOSIS — Z1231 Encounter for screening mammogram for malignant neoplasm of breast: Secondary | ICD-10-CM | POA: Diagnosis not present

## 2014-03-18 ENCOUNTER — Other Ambulatory Visit: Payer: Self-pay | Admitting: Internal Medicine

## 2014-03-18 NOTE — Telephone Encounter (Signed)
Caller name: [name] Relation to pt: daughter Call back number: [phone] Pharmacy:  Reason for call:   Patient daughter called in requesting a refill of atorvastatin to be sent to [facility]. She is also requesting an emergency supply [facility] to [facility] on [address]

## 2014-03-22 MED ORDER — ATORVASTATIN CALCIUM 10 MG PO TABS: 10.0000 mg | ORAL_TABLET | Freq: Every day | ORAL | Status: DC

## 2014-03-22 NOTE — Telephone Encounter (Signed)
Sent this to [facility] by mistake. Please send in refill. Thanks!  Patient will run out on Sunday

## 2014-03-22 NOTE — Telephone Encounter (Signed)
[facility]or[facility]atin sent to [facility] on [address] (# 30 and 0 RFs) and (#90 and 1 RFs) sent to [facility] as requested.

## 2014-03-30 ENCOUNTER — Other Ambulatory Visit: Payer: Self-pay | Admitting: Internal Medicine

## 2014-04-01 ENCOUNTER — Other Ambulatory Visit: Payer: Self-pay

## 2014-04-01 ENCOUNTER — Ambulatory Visit (INDEPENDENT_AMBULATORY_CARE_PROVIDER_SITE_OTHER): Payer: Medicare Other | Admitting: Internal Medicine

## 2014-04-01 ENCOUNTER — Encounter: Payer: Self-pay | Admitting: Internal Medicine

## 2014-04-01 VITALS — BP 124/84 | HR 83 | Temp 97.6°F | Ht 66.0 in | Wt 199.5 lb

## 2014-04-01 DIAGNOSIS — J410 Simple chronic bronchitis: Secondary | ICD-10-CM

## 2014-04-01 MED ORDER — HYDROCODONE-HOMATROPINE 5-1.5 MG/5ML PO SYRP: 5.0000 mL | ORAL_SOLUTION | Freq: Two times a day (BID) | ORAL | Status: DC | PRN

## 2014-04-01 MED ORDER — PREDNISONE 10 MG PO TABS: 10.0000 mg | ORAL_TABLET | Freq: Every day | ORAL | Status: DC

## 2014-04-01 MED ORDER — AZITHROMYCIN 250 MG PO TABS: ORAL_TABLET | ORAL | Status: DC

## 2014-04-01 NOTE — Patient Instructions (Signed)
Rest, fluids , tylenol  Prednisone for 5 days   If  cough, take Mucinex DM twice a day as needed  If the cough continue, take hydrocodone, will cause drowsiness  For wheezing and chest congestion: Use albuterol as needed  Take the antibiotic as prescribed  (Zithromax)  Call if not gradually better over the next  10 days  Call anytime if the symptoms are severe

## 2014-04-01 NOTE — Progress Notes (Signed)
Subjective:    Patient ID: Rebecca Elliott, female    DOB: 09/28/1926, 81 y.o.   MRN: [id number]  DOS:  04/01/2014 Type of visit - description : acute Interval history: Symptoms started a few days ago with chest congestion, feeling short of breath, increased cough from baseline. She saw sputum only one time, yellow in color. Does not think she has fever. Cough is preventing her from sleeping.   Review of Systems  Denies nausea vomiting but had diarrhea a few times No sinus pain or congestion, appetite decreased No hemoptysis Mild  Myalgias   Past Medical History  Diagnosis Date  . Hypertension   . Diabetes mellitus     as an adult  . Hyperlipidemia   . Depression   . COPD  and ASTHMA   . DJD (degenerative joint disease)   . GERD (gastroesophageal reflux disease)     and HH w/ reflux  . History of colonic polyps   . Allergic rhinitis     Past Surgical History  Procedure Laterality Date  . Appendectomy    . Cataract extraction, bilateral    . Cholecystectomy    . Tubal ligation    . Tonsillectomy    . Abdominal hysterectomy      apparently no oophorectomy  . Bilateral breast tumor      History   Social History  . Marital Status: Widowed    Spouse Name: N/A  . Number of Children: 5  . Years of Education: N/A   Occupational History  . retired     Social History Main Topics  . Smoking status: Former Research scientist (life sciences)  . Smokeless tobacco: Never Used  . Alcohol Use: No  . Drug Use: No  . Sexual Activity: Not on file   Other Topics Concern  . Not on file   Social History Narrative   widow (1994), children 5 (lost 3 sons,2 alive, daughters)   2 daughter lives w/ her, both single          Medication List       This list is accurate as of: 04/01/14  6:30 PM.  Always use your most recent med list.               albuterol 108 (90 BASE) MCG/ACT inhaler  Commonly known as:  VENTOLIN HFA  Inhale 2 puffs into the lungs every 6 (six) hours as needed.     amLODipine 10 MG tablet  Commonly known as:  NORVASC  TAKE 1 TABLET DAILY     aspirin 81 MG tablet  Take 81 mg by mouth daily.     atorvastatin 10 MG tablet  Commonly known as:  LIPITOR  Take 1 tablet (10 mg total) by mouth daily.     azithromycin 250 MG tablet  Commonly known as:  ZITHROMAX Z-PAK  2 tabs a day the first day, then 1 tab a day x 4 days     BENEFIBER DRINK MIX PO  Take by mouth 2 (two) times daily.     CENTRUM SILVER Chew  Chew by mouth.     dextromethorphan-guaiFENesin 30-600 MG per 12 hr tablet  Commonly known as:  MUCINEX DM  Take 1 tablet by mouth daily.     docusate sodium 100 MG capsule  Commonly known as:  COLACE  Take 1-2 by mouth once a day.     HYDROcodone-homatropine 5-1.5 MG/5ML syrup  Commonly known as:  HYCODAN  Take 5 mLs by mouth 2 (two) times  daily as needed for cough.     JANUVIA 100 MG tablet  Generic drug:  sitaGLIPtin  TAKE 1 TABLET DAILY     loratadine 10 MG tablet  Commonly known as:  CLARITIN  Take 10 mg by mouth daily.     losartan-hydrochlorothiazide 100-12.5 MG per tablet  Commonly known as:  HYZAAR  TAKE 1 TABLET DAILY     metFORMIN 500 MG tablet  Commonly known as:  GLUCOPHAGE  TAKE 2 TABLETS (=1000MG )   DAILY WITH BREAKFAST     metoprolol succinate 25 MG 24 hr tablet  Commonly known as:  TOPROL-XL  TAKE 1 AND 1/2 TABLETS     DAILY     pantoprazole 40 MG tablet  Commonly known as:  PROTONIX  TAKE 1 TABLET DAILY     predniSONE 10 MG tablet  Commonly known as:  DELTASONE  Take 1 tablet (10 mg total) by mouth daily. 2 tabs a day x 5 days     tiotropium 18 MCG inhalation capsule  Commonly known as:  SPIRIVA HANDIHALER  INHALE THE CONTENTS OF ONE CAPSULE DAILY (PLACE CAPSULE INTO INHALER)           Objective:   Physical Exam BP 124/84 mmHg  Pulse 83  Temp(Src) 97.6 F (36.4 C) (Oral)  Ht 5\' 6"  (1.676 m)  Wt 199 lb 8 oz (90.493 kg)  BMI 32.22 kg/m2  SpO2 95% General:   Well developed, well nourished  . NAD.  HEENT:  Normocephalic . Face symmetric, atraumatic; TMs hard to visualize but normal, no discharge. Nose is slightly congested, sinuses not TTP Lungs:  + Large airway congestion, few rhonchi, no wheezing but increase expiratory time Normal respiratory effort, no intercostal retractions, no accessory muscle use. Heart: RRR,  no murmur.  Muscle skeletal: no pretibial edema bilaterally  Skin: Not pale. Not jaundice Neurologic:  alert & oriented X3.  Speech normal, gait appropriate for age and unassisted Psych--  Cognition and judgment appear intact.  Cooperative with normal attention span and concentration.  Behavior appropriate. No anxious or depressed appearing.       Assessment & Plan:

## 2014-04-01 NOTE — Progress Notes (Signed)
Pre visit review using our clinic review tool, if applicable. No additional management support is needed unless otherwise documented below in the visit note. 

## 2014-04-01 NOTE — Assessment & Plan Note (Signed)
COPD exacerbation, Mild COPD exacerbation, + difficulty breathing, increased airway congestion. Plan:  Z-Pak I am somewhat reluctant to prescribe prednisone but she will get great benefit from them. She does not have a glucometer consequently will prescribe a low-dose of steroids and she is recommended to call if she has any blurred vision or increased urination. Cough control with hydrocodone if needed, side affects discussed. See instructions

## 2014-04-18 ENCOUNTER — Ambulatory Visit (HOSPITAL_BASED_OUTPATIENT_CLINIC_OR_DEPARTMENT_OTHER)
Admission: RE | Admit: 2014-04-18 | Discharge: 2014-04-18 | Disposition: A | Discharge status: Home / Self Care | Payer: Medicare Other | Source: Ambulatory Visit | Attending: Medical | Admitting: Medical

## 2014-04-18 ENCOUNTER — Encounter: Payer: Self-pay | Admitting: Medical

## 2014-04-18 ENCOUNTER — Ambulatory Visit (INDEPENDENT_AMBULATORY_CARE_PROVIDER_SITE_OTHER): Payer: Medicare Other | Admitting: Medical

## 2014-04-18 VITALS — BP 139/55 | HR 74 | Temp 97.7°F | Ht 66.0 in | Wt 199.8 lb

## 2014-04-18 DIAGNOSIS — R0602 Shortness of breath: Secondary | ICD-10-CM | POA: Diagnosis not present

## 2014-04-18 DIAGNOSIS — R05 Cough: Secondary | ICD-10-CM | POA: Insufficient documentation

## 2014-04-18 DIAGNOSIS — J309 Allergic rhinitis, unspecified: Secondary | ICD-10-CM | POA: Insufficient documentation

## 2014-04-18 DIAGNOSIS — R6883 Chills (without fever): Secondary | ICD-10-CM

## 2014-04-18 DIAGNOSIS — R079 Chest pain, unspecified: Secondary | ICD-10-CM | POA: Insufficient documentation

## 2014-04-18 DIAGNOSIS — R059 Cough, unspecified: Secondary | ICD-10-CM

## 2014-04-18 DIAGNOSIS — J301 Allergic rhinitis due to pollen: Secondary | ICD-10-CM

## 2014-04-18 LAB — CBC WITH DIFFERENTIAL/PLATELET
BASOS PCT: 0.6 % (ref 0.0–3.0)
Basophils Absolute: 0.1 10*3/uL (ref 0.0–0.1)
EOS PCT: 1.5 % (ref 0.0–5.0)
Eosinophils Absolute: 0.2 10*3/uL (ref 0.0–0.7)
HEMATOCRIT: 34.7 % — AB (ref 36.0–46.0)
Hemoglobin: 11.1 g/dL — ABNORMAL LOW (ref 12.0–15.0)
Lymphocytes Relative: 31 % (ref 12.0–46.0)
Lymphs Abs: 3.5 10*3/uL (ref 0.7–4.0)
MCHC: 31.9 g/dL (ref 30.0–36.0)
MCV: 73.5 fl — AB (ref 78.0–100.0)
MONO ABS: 1.1 10*3/uL — AB (ref 0.1–1.0)
Monocytes Relative: 9.6 % (ref 3.0–12.0)
NEUTROS PCT: 57.3 % (ref 43.0–77.0)
Neutro Abs: 6.5 10*3/uL (ref 1.4–7.7)
Platelets: 387 10*3/uL (ref 150.0–400.0)
RBC: 4.72 Mil/uL (ref 3.87–5.11)
RDW: 16.4 % — ABNORMAL HIGH (ref 11.5–15.5)
WBC: 11.4 10*3/uL — AB (ref 4.0–10.5)

## 2014-04-18 MED ORDER — BENZONATATE 100 MG PO CAPS: 100.0000 mg | ORAL_CAPSULE | Freq: Three times a day (TID) | ORAL | Status: DC | PRN

## 2014-04-18 MED ORDER — MOMETASONE FUROATE 50 MCG/ACT NA SUSP: 2.0000 | Freq: Every day | NASAL | Status: DC

## 2014-04-18 MED ORDER — PREDNISONE 20 MG PO TABS: ORAL_TABLET | ORAL | Status: DC

## 2014-04-18 MED ORDER — TIOTROPIUM BROMIDE MONOHYDRATE 18 MCG IN CAPS: ORAL_CAPSULE | RESPIRATORY_TRACT | Status: DC

## 2014-04-18 NOTE — Progress Notes (Signed)
Subjective:    Patient ID: Rebecca Elliott, female    DOB: August 26, 1926, 81 y.o.   MRN: [id number]  HPI  Pt in stating 3 wks ago had bronchitis.  She states hoarse voice off and on. Feels weak with decreased energy. Pt does admit seasonal allergies. Pt states just does not feel good. No cxr done on last visit. She has copd. Gets sob easily. Hx of this and not far off her baseline. Pt is borderline diabetic. No fevers but occasoinal chills.   Nasal congestion, sneezing and itching eyes.  Pt felt only little better last time briefly for 2-3 days.  Review of Systems  Constitutional: Positive for chills. Negative for fever and fatigue.  HENT: Positive for congestion and sinus pressure. Negative for ear pain, postnasal drip and sore throat.   Respiratory: Positive for cough and shortness of breath. Negative for chest tightness and wheezing.   Cardiovascular: Negative for chest pain and palpitations.  Musculoskeletal: Negative for back pain.  Neurological: Negative for dizziness, facial asymmetry, weakness, numbness and headaches.  Hematological: Negative for adenopathy. Does not bruise/bleed easily.  Psychiatric/Behavioral: Negative for behavioral problems and confusion.   Past Medical History  Diagnosis Date  . Hypertension   . Diabetes mellitus     as an adult  . Hyperlipidemia   . Depression   . COPD  and ASTHMA   . DJD (degenerative joint disease)   . GERD (gastroesophageal reflux disease)     and HH w/ reflux  . History of colonic polyps   . Allergic rhinitis     History   Social History  . Marital Status: Widowed    Spouse Name: N/A  . Number of Children: 5  . Years of Education: N/A   Occupational History  . retired     Social History Main Topics  . Smoking status: Former Research scientist (life sciences)  . Smokeless tobacco: Never Used  . Alcohol Use: No  . Drug Use: No  . Sexual Activity: Not on file   Other Topics Concern  . Not on file   Social History Narrative   widow  (1994), children 5 (lost 3 sons,2 alive, daughters)   2 daughter lives w/ her, both single      Past Surgical History  Procedure Laterality Date  . Appendectomy    . Cataract extraction, bilateral    . Cholecystectomy    . Tubal ligation    . Tonsillectomy    . Abdominal hysterectomy      apparently no oophorectomy  . Bilateral breast tumor      Family History  Problem Relation Age of Onset  . Breast cancer Sister     x 2  . Colon cancer Neg Hx   . Diabetes Father   . Diabetes Sister   . Heart attack Brother     early 12s  . Heart attack Son   . Pancreatic cancer Son   . Stomach cancer Sister     No Known Allergies  Current Outpatient Prescriptions on File Prior to Visit  Medication Sig Dispense Refill  . albuterol (VENTOLIN HFA) 108 (90 BASE) MCG/ACT inhaler Inhale 2 puffs into the lungs every 6 (six) hours as needed. 18 g 2  . amLODipine (NORVASC) 10 MG tablet TAKE 1 TABLET DAILY 90 tablet 1  . aspirin 81 MG tablet Take 81 mg by mouth daily.     Marland Kitchen atorvastatin (LIPITOR) 10 MG tablet Take 1 tablet (10 mg total) by mouth daily. 90 tablet 1  .  dextromethorphan-guaiFENesin (MUCINEX DM) 30-600 MG per 12 hr tablet Take 1 tablet by mouth daily.      Marland Kitchen docusate sodium (COLACE) 100 MG capsule Take 1-2 by mouth once a day.     Marland Kitchen HYDROcodone-homatropine (HYCODAN) 5-1.5 MG/5ML syrup Take 5 mLs by mouth 2 (two) times daily as needed for cough. 120 mL 0  . JANUVIA 100 MG tablet TAKE 1 TABLET DAILY 90 tablet 1  . loratadine (CLARITIN) 10 MG tablet Take 10 mg by mouth daily.      Marland Kitchen losartan-hydrochlorothiazide (HYZAAR) 100-12.5 MG per tablet TAKE 1 TABLET DAILY 90 tablet 1  . metFORMIN (GLUCOPHAGE) 500 MG tablet TAKE 2 TABLETS (=1000MG )   DAILY WITH BREAKFAST 180 tablet 1  . metoprolol succinate (TOPROL-XL) 25 MG 24 hr tablet TAKE 1 AND 1/2 TABLETS     DAILY 135 tablet 1  . Multiple Vitamins-Minerals (CENTRUM SILVER) CHEW Chew by mouth.      . Wheat Dextrin (BENEFIBER DRINK MIX PO)  Take by mouth 2 (two) times daily.      . pantoprazole (PROTONIX) 40 MG tablet TAKE 1 TABLET DAILY 90 tablet 1   No current facility-administered medications on file prior to visit.    BP 139/55 mmHg  Pulse 74  Temp(Src) 97.7 F (36.5 C) (Oral)  Ht 5\' 6"  (1.676 m)  Wt 199 lb 12.8 oz (90.629 kg)  BMI 32.26 kg/m2  SpO2 98%      Objective:   Physical Exam  General  Mental Status - Alert. General Appearance - Well groomed. Not in acute distress.  Skin Rashes- No Rashes.  HEENT Head- Normal. Ear Auditory Canal - Left- Normal. Right - Normal.Tympanic Membrane- Left- Normal. Right- Normal. Eye Sclera/Conjunctiva- Left- Normal. Right- Normal. Nose & Sinuses Nasal Mucosa- Left-  Boggy and Congested. Right-  Boggy and  Congested.No bilateral maxillaror  frontal sinus pressure. Mouth & Throat Lips: Upper Lip- Normal: no dryness, cracking, pallor, cyanosis, or vesicular eruption. Lower Lip-Normal: no dryness, cracking, pallor, cyanosis or vesicular eruption. Buccal Mucosa- Bilateral- No Aphthous ulcers. Oropharynx- No Discharge or Erythema. +pnd Tonsils: Characteristics- Bilateral- No Erythema or Congestion. Size/Enlargement- Bilateral- No enlargement. Discharge- bilateral-None.  Neck Neck- Supple. No Masses.   Chest and Lung Exam Auscultation: Breath Sounds:-Clear even and unlabored.  Cardiovascular Auscultation:Rythm- Regular, rate and rhythm. Murmurs & Other Heart Sounds:Ausculatation of the heart reveal- No Murmurs.  Lymphatic Head & Neck General Head & Neck Lymphatics: Bilateral: Description- No Localized lymphadenopathy.       Assessment & Plan:

## 2014-04-18 NOTE — Assessment & Plan Note (Signed)
Allergies with copd flair. Will get cxr and cbc to make sure not bronchitis or pnuemonia.  Continue claritin. Rx nasonex. Low dose prednisone since borderline bs control/diabetic.

## 2014-04-18 NOTE — Progress Notes (Signed)
Pre visit review using our clinic review tool, if applicable. No additional management support is needed unless otherwise documented below in the visit note. 

## 2014-04-18 NOTE — Assessment & Plan Note (Signed)
Rx benzonatate for cough.  After cxr and cbc may add another round of antibiotic.   Follow up in 7 days or as needed.

## 2014-04-18 NOTE — Patient Instructions (Addendum)
Allergies with copd flair. Will get cxr and cbc to make sure not bronchitis or pnuemonia.  Continue claritin. Rx nasonex. Low dose prednisone since borderline bs control/diabetic.  Rx benzonatate for cough.  After cxr and cbc may add another round of antibiotic.   Follow up in 7 days or as needed.

## 2014-04-19 ENCOUNTER — Telehealth: Payer: Self-pay | Admitting: Medical

## 2014-04-19 ENCOUNTER — Other Ambulatory Visit: Payer: Self-pay | Admitting: Internal Medicine

## 2014-04-19 DIAGNOSIS — R911 Solitary pulmonary nodule: Secondary | ICD-10-CM

## 2014-04-23 NOTE — Telephone Encounter (Signed)
Patient states she will be in Thursday for CT.

## 2014-04-24 ENCOUNTER — Other Ambulatory Visit: Payer: Self-pay

## 2014-04-25 ENCOUNTER — Ambulatory Visit (HOSPITAL_BASED_OUTPATIENT_CLINIC_OR_DEPARTMENT_OTHER)
Admission: RE | Admit: 2014-04-25 | Discharge: 2014-04-25 | Disposition: A | Discharge status: Home / Self Care | Payer: Medicare Other | Source: Ambulatory Visit | Attending: Medical | Admitting: Medical

## 2014-04-25 DIAGNOSIS — R911 Solitary pulmonary nodule: Secondary | ICD-10-CM | POA: Diagnosis present

## 2014-04-25 DIAGNOSIS — R918 Other nonspecific abnormal finding of lung field: Secondary | ICD-10-CM | POA: Diagnosis not present

## 2014-04-25 DIAGNOSIS — I2510 Atherosclerotic heart disease of native coronary artery without angina pectoris: Secondary | ICD-10-CM | POA: Diagnosis not present

## 2014-04-26 ENCOUNTER — Telehealth: Payer: Self-pay | Admitting: Family

## 2014-04-26 DIAGNOSIS — R918 Other nonspecific abnormal finding of lung field: Secondary | ICD-10-CM

## 2014-04-26 NOTE — Telephone Encounter (Addendum)
Please contact pt and let her know that I was covering for Percell Miller and Dr. Larose Kells last week.  I reviewed her CT scan- it again shows some pulmonary nodules, some old and some new.  Etiology is unclear.  Radiology is recommending that she have a follow up CT chest in 3 months.  I will place order.

## 2014-04-28 ENCOUNTER — Other Ambulatory Visit: Payer: Self-pay | Admitting: Internal Medicine

## 2014-04-29 ENCOUNTER — Other Ambulatory Visit: Payer: Self-pay | Admitting: Internal Medicine

## 2014-05-01 DIAGNOSIS — R918 Other nonspecific abnormal finding of lung field: Secondary | ICD-10-CM | POA: Insufficient documentation

## 2014-05-02 ENCOUNTER — Other Ambulatory Visit: Payer: Self-pay | Admitting: Internal Medicine

## 2014-05-02 NOTE — Telephone Encounter (Signed)
Spoke with Pt, informed her of CT results. Informed her Dr. Larose Kells would like for her to see pulmonology. Pt agreed. Referral placed to pulmonology.

## 2014-05-02 NOTE — Telephone Encounter (Signed)
Please call the patient, in light of the new nodules I prefer her to see pulmonary, arrange a referral please

## 2014-05-14 ENCOUNTER — Other Ambulatory Visit: Payer: Self-pay | Admitting: Internal Medicine

## 2014-05-30 ENCOUNTER — Other Ambulatory Visit: Payer: Self-pay

## 2014-06-04 ENCOUNTER — Encounter: Payer: Self-pay | Admitting: Internal Medicine

## 2014-06-04 ENCOUNTER — Ambulatory Visit (INDEPENDENT_AMBULATORY_CARE_PROVIDER_SITE_OTHER): Payer: Medicare Other | Admitting: Internal Medicine

## 2014-06-04 VITALS — BP 128/66 | HR 76 | Temp 97.6°F | Ht 66.0 in | Wt 197.2 lb

## 2014-06-04 DIAGNOSIS — E119 Type 2 diabetes mellitus without complications: Secondary | ICD-10-CM | POA: Diagnosis not present

## 2014-06-04 DIAGNOSIS — I10 Essential (primary) hypertension: Secondary | ICD-10-CM | POA: Diagnosis not present

## 2014-06-04 DIAGNOSIS — D649 Anemia, unspecified: Secondary | ICD-10-CM

## 2014-06-04 DIAGNOSIS — K219 Gastro-esophageal reflux disease without esophagitis: Secondary | ICD-10-CM | POA: Diagnosis not present

## 2014-06-04 LAB — HEMOGLOBIN A1C: HEMOGLOBIN A1C: 6.8 % — AB (ref 4.6–6.5)

## 2014-06-04 LAB — CBC WITH DIFFERENTIAL/PLATELET
BASOS ABS: 0.1 10*3/uL (ref 0.0–0.1)
Basophils Relative: 0.9 % (ref 0.0–3.0)
Eosinophils Absolute: 0.3 10*3/uL (ref 0.0–0.7)
Eosinophils Relative: 3.3 % (ref 0.0–5.0)
HCT: 35.8 % — ABNORMAL LOW (ref 36.0–46.0)
HEMOGLOBIN: 11.4 g/dL — AB (ref 12.0–15.0)
LYMPHS ABS: 2.5 10*3/uL (ref 0.7–4.0)
Lymphocytes Relative: 26.3 % (ref 12.0–46.0)
MCHC: 31.8 g/dL (ref 30.0–36.0)
MCV: 72.4 fl — ABNORMAL LOW (ref 78.0–100.0)
MONO ABS: 0.7 10*3/uL (ref 0.1–1.0)
Monocytes Relative: 7.3 % (ref 3.0–12.0)
NEUTROS ABS: 5.9 10*3/uL (ref 1.4–7.7)
Neutrophils Relative %: 62.2 % (ref 43.0–77.0)
Platelets: 325 10*3/uL (ref 150.0–400.0)
RBC: 4.94 Mil/uL (ref 3.87–5.11)
RDW: 17.7 % — AB (ref 11.5–15.5)
WBC: 9.6 10*3/uL (ref 4.0–10.5)

## 2014-06-04 LAB — BASIC METABOLIC PANEL
BUN: 23 mg/dL (ref 6–23)
CALCIUM: 9.5 mg/dL (ref 8.4–10.5)
CO2: 29 mEq/L (ref 19–32)
CREATININE: 1.33 mg/dL — AB (ref 0.40–1.20)
Chloride: 101 mEq/L (ref 96–112)
GFR: 40.18 mL/min — ABNORMAL LOW (ref >60.00)
GLUCOSE: 154 mg/dL — AB (ref 70–99)
Potassium: 3.7 mEq/L (ref 3.5–5.1)
Sodium: 137 mEq/L (ref 135–145)

## 2014-06-04 LAB — IRON: IRON: 46 ug/dL (ref 42–145)

## 2014-06-04 LAB — FERRITIN: Ferritin: 9.6 ng/mL — ABNORMAL LOW (ref 10.0–291.0)

## 2014-06-04 MED ORDER — AMLODIPINE BESYLATE 10 MG PO TABS: 10.0000 mg | ORAL_TABLET | Freq: Every day | ORAL | Status: DC

## 2014-06-04 MED ORDER — LOSARTAN POTASSIUM-HCTZ 100-12.5 MG PO TABS: 1.0000 | ORAL_TABLET | Freq: Every day | ORAL | Status: DC

## 2014-06-04 MED ORDER — TIOTROPIUM BROMIDE MONOHYDRATE 18 MCG IN CAPS: 18.0000 ug | ORAL_CAPSULE | Freq: Every day | RESPIRATORY_TRACT | Status: DC

## 2014-06-04 MED ORDER — PANTOPRAZOLE SODIUM 40 MG PO TBEC: 40.0000 mg | DELAYED_RELEASE_TABLET | Freq: Every day | ORAL | Status: DC

## 2014-06-04 MED ORDER — SITAGLIPTIN PHOSPHATE 100 MG PO TABS: 100.0000 mg | ORAL_TABLET | Freq: Every day | ORAL | Status: DC

## 2014-06-04 MED ORDER — METOPROLOL SUCCINATE ER 25 MG PO TB24: 37.5000 mg | ORAL_TABLET | Freq: Every day | ORAL | Status: DC

## 2014-06-04 MED ORDER — ATORVASTATIN CALCIUM 10 MG PO TABS: 10.0000 mg | ORAL_TABLET | Freq: Every day | ORAL | Status: DC

## 2014-06-04 MED ORDER — METFORMIN HCL 500 MG PO TABS: 1000.0000 mg | ORAL_TABLET | Freq: Every day | ORAL | Status: DC

## 2014-06-04 NOTE — Progress Notes (Signed)
Subjective:    Patient ID: Rebecca Elliott, female    DOB: 1926-09-13, 81 y.o.   MRN: [id number]  DOS:  06/04/2014 Type of visit - description : rov Interval history:  Patient is a 81 year old female with a history of hypertension, hyperlipidemia, diabetes mellitus, GERD, and COPD in today for routine medical care.  Patient was seen in clinic 3/16 for COPD exacerbation and given antibiotics and prednisone. Sx resolved,  feeling much better. Good compliance w/ meds  She notes dyspnea with exertion which prevents her from using the stairs in her house. She denies chest pain or any lasting cough/symptoms of infection or inflammation.   At previous appointment, CXR showed multiple nodules which were confirmed with subsequent CT. Patient has upcoming appointment with Pulmonology 06/13/14 for further workup.   HTN: Patient notes good adherence to BP meds, and that her hypertension is well-controlled, she is checking her blood pressure fairly regularly. She states her BP monitor has worn out but she checks it at pharmacies regularly and it typically runs with systolic around [id number].   DM: She states good adherence to diabetes medications. She is not currently checking CBGs. She notes she had an eye exam last year which was negative for diabetic changes. She denies any numbness or tingling.   She also mentions seasonal allergies of rhinorrhea and congestion which have been worse over the past month but are improving. She takes claritin which she notes provides some relief.    Review of Systems  Constitutional: No fever, chills.  Respiratory: Dyspnea with mild exertion. No cough , mucus production Cardiovascular: No CP, leg swelling or palpitations GI: no nausea, vomiting, diarrhea or abdominal pain.  No blood in the stools.  GU: No dysuria, gross hematuria, difficulty urinating.  Allergic, immunologic: Seasonal allergies Neurological: No dizziness or headaches.  Psychiatry: Slight occasional  depression. No suicidal ideas.   Past Medical History  Diagnosis Date  . Hypertension   . Diabetes mellitus     as an adult  . Hyperlipidemia   . Depression   . COPD  and ASTHMA   . DJD (degenerative joint disease)   . GERD (gastroesophageal reflux disease)     and HH w/ reflux  . History of colonic polyps   . Allergic rhinitis     Past Surgical History  Procedure Laterality Date  . Appendectomy    . Cataract extraction, bilateral    . Cholecystectomy    . Tubal ligation    . Tonsillectomy    . Abdominal hysterectomy      apparently no oophorectomy  . Bilateral breast tumor      History   Social History  . Marital Status: Widowed    Spouse Name: N/A  . Number of Children: 5  . Years of Education: N/A   Occupational History  . retired     Social History Main Topics  . Smoking status: Former Research scientist (life sciences)  . Smokeless tobacco: Never Used  . Alcohol Use: No  . Drug Use: No  . Sexual Activity: Not on file   Other Topics Concern  . Not on file   Social History Narrative   widow (1994), children 5 (lost 3 sons,2 alive, daughters)   2 daughter lives w/ her, both single     [name] 79 y/o   GGchildren x 2      Family History  Problem Relation Age of Onset  . Breast cancer Sister     x 2  . Colon  cancer Neg Hx   . Diabetes Father   . Diabetes Sister   . Heart attack Brother     early 39s  . Heart attack Son   . Pancreatic cancer Son   . Stomach cancer Sister    Family History  Problem Relation Age of Onset  . Breast cancer Sister     x 2  . Colon cancer Neg Hx   . Diabetes Father   . Diabetes Sister   . Heart attack Brother     early 21s  . Heart attack Son   . Pancreatic cancer Son   . Stomach cancer Sister        Medication List       This list is accurate as of: 06/04/14 11:04 AM.  Always use your most recent med list.               albuterol 108 (90 BASE) MCG/ACT inhaler  Commonly known as:  VENTOLIN HFA  Inhale 2 puffs into the lungs  every 6 (six) hours as needed.     amLODipine 10 MG tablet  Commonly known as:  NORVASC  Take 1 tablet (10 mg total) by mouth daily.     aspirin 81 MG tablet  Take 81 mg by mouth daily.     atorvastatin 10 MG tablet  Commonly known as:  LIPITOR  Take 1 tablet (10 mg total) by mouth daily.     BENEFIBER DRINK MIX PO  Take by mouth 2 (two) times daily.     benzonatate 100 MG capsule  Commonly known as:  TESSALON  Take 1 capsule (100 mg total) by mouth 3 (three) times daily as needed.     CENTRUM SILVER Chew  Chew by mouth.     dextromethorphan-guaiFENesin 30-600 MG per 12 hr tablet  Commonly known as:  MUCINEX DM  Take 1 tablet by mouth daily.     docusate sodium 100 MG capsule  Commonly known as:  COLACE  Take 1-2 by mouth once a day.     loratadine 10 MG tablet  Commonly known as:  CLARITIN  Take 10 mg by mouth daily.     losartan-hydrochlorothiazide 100-12.5 MG per tablet  Commonly known as:  HYZAAR  TAKE 1 TABLET DAILY     metFORMIN 500 MG tablet  Commonly known as:  GLUCOPHAGE  Take 2 tablets (1,000 mg total) by mouth daily with breakfast.     metoprolol succinate 25 MG 24 hr tablet  Commonly known as:  TOPROL-XL  TAKE 1 AND 1/2 TABLETS     DAILY     mometasone 50 MCG/ACT nasal spray  Commonly known as:  NASONEX  Place 2 sprays into the nose daily.     pantoprazole 40 MG tablet  Commonly known as:  PROTONIX  TAKE 1 TABLET DAILY     predniSONE 20 MG tablet  Commonly known as:  DELTASONE  1 tab po tid day 1, 1 tab po bid day 2, 1 tab po day 3.     sitaGLIPtin 100 MG tablet  Commonly known as:  JANUVIA  Take 1 tablet (100 mg total) by mouth daily.     tiotropium 18 MCG inhalation capsule  Commonly known as:  SPIRIVA HANDIHALER  Place 1 capsule (18 mcg total) into inhaler and inhale daily.           Objective:   Physical Exam BP 128/66 mmHg  Pulse 76  Temp(Src) 97.6 F (36.4 C) (Oral)  Ht  5\' 6"  (1.676 m)  Wt 197 lb 4 oz (89.472 kg)  BMI  31.85 kg/m2  SpO2 95%  General:   Well developed, well nourished . NAD.  HEENT:  Normocephalic . Face symmetric, atraumatic Lungs:  Decreased breath sounds bilaterally Normal respiratory effort, no intercostal retractions, no accessory muscle use. Heart: RRR,  no murmur, distal pulses intact. No pretibial edema bilaterally  Skin: Not pale. Not jaundice Neurologic:  alert & oriented X3.  Speech normal, gait appropriate for age and unassisted Psych--  Cognition and judgment appear intact.  Cooperative with normal attention span and concentration.  Behavior appropriate. No anxious or depressed appearing.     Assessment & Plan:   (Patient seen along with   Aletta Edouard, medical student)  Pulmonary nodules, per CXR 3-16, CT 04-25-14 confirmatory  Continue with pulmonology appointment on 06/13/14 for further workup.   COPD:  Currently taking Spiriva daily and albuterol prn. States albuterol is used 1-2 times per week. Patient instructed to let us know if albuterol usage increases or if symptoms become more severe.  HTN:  Continue checking BP regularly and continue current medication regimen. Patient instructed to let us know if BP becomes too high or too low.  DMII:  Patient adhering well to medications, A1c has remained constant around 7. Patient counseled on importance of avoiding hypoglycemic episodes. Plan:  Continue current regimen of medications. Recheck A1c today.   GERD: Well-controlled with Tums prn ~ 1 /week. Patient instructed to let us know if reflux becomes refractory to current medications. Discussed potential need for addition of PPI.  Allergic rhinitis: Well-controlled with Claritin. Patient instructed to let us know if allergies become more severe.  Anemia:  Colonoscopy 2004: No polyps EGD 2006 for dysphagia showed hiatal hernia, will call to stricture? Colonoscopy 2010 showed diverticuli CBC on 04/18/14 showed mild leukocytosis and microcytic anemia.  GI  review of systems negative. Plan We will recheck CBC and check Iron levels today to evaluate potential etiologies of microcytic anemia.  Most recent colonoscopy was 2010 (tics)

## 2014-06-04 NOTE — Patient Instructions (Signed)
Get your blood work before you leave   Come back to the office 4 months   for a routine check up

## 2014-06-04 NOTE — Progress Notes (Signed)
Pre visit review using our clinic review tool, if applicable. No additional management support is needed unless otherwise documented below in the visit note. 

## 2014-06-06 ENCOUNTER — Telehealth: Payer: Self-pay | Admitting: Internal Medicine

## 2014-06-06 NOTE — Telephone Encounter (Signed)
Caller name:Shahla  Relationship to patient:SELF  Can be reached:[phone] Pharmacy:  Reason for call:PLEASE CALL HER ABOUT HER LABS

## 2014-06-07 MED ORDER — FERROUS SULFATE 325 (65 FE) MG PO TABS: 325.0000 mg | ORAL_TABLET | Freq: Two times a day (BID) | ORAL | Status: DC

## 2014-06-07 NOTE — Telephone Encounter (Signed)
Spoke with Pt, informed her of lab results. See lab results notes.

## 2014-06-07 NOTE — Addendum Note (Signed)
Addended by: Wilfrid Lund on: 06/07/2014 09:09 AM   Modules accepted: Orders

## 2014-06-13 ENCOUNTER — Ambulatory Visit (INDEPENDENT_AMBULATORY_CARE_PROVIDER_SITE_OTHER): Payer: Medicare Other | Admitting: Pulmonary Disease

## 2014-06-13 ENCOUNTER — Encounter: Payer: Self-pay | Admitting: Pulmonary Disease

## 2014-06-13 VITALS — BP 111/65 | HR 74 | Temp 97.6°F | Ht 66.0 in | Wt 204.0 lb

## 2014-06-13 DIAGNOSIS — J410 Simple chronic bronchitis: Secondary | ICD-10-CM | POA: Diagnosis not present

## 2014-06-13 DIAGNOSIS — R918 Other nonspecific abnormal finding of lung field: Secondary | ICD-10-CM

## 2014-06-13 NOTE — Patient Instructions (Addendum)
CT abdomen/ pelvis - oral contrast only Repeat Ct chest in July 2016 - 83mnth FU Trial of stiolto instead of spiriva - call for Rx if this works

## 2014-06-13 NOTE — Assessment & Plan Note (Signed)
I'm concerned that these nodules may represent meta- static malignancy. However there are 2 small at this point to characterize further. They're below resolution of PET scan. I will proceed with a CT abdomen to look for an extrathoracic primary. If not then we will have a 3 month follow-up CT scan scheduled in July 2016 Her [name] did indicate that she would not desire aggressive treatment for this should this turn out to be malignancy

## 2014-06-13 NOTE — Progress Notes (Signed)
Subjective:    Patient ID: Rebecca Elliott, female    DOB: 11/03/1926, 81 y.o.   MRN: [id number]  HPI   81 year old remote smoker presents for evaluation of abnormal imaging showing multiple pulmonary nodules. She had flulike symptoms with increased dyspnea on exertion and an abnormal chest x-ray. CT chest 04/2014 showed multiple small nodules with the largest being about 8 mm in the right lower lobe. These nodules were new when compared to a CT from 02/2003.  She smoked about 30-pack-years starting in her 34s until she retired from [employer] quit in her 27s. She now reports minimal cough productive of clear phlegm. She takes Spiriva daily, uses albuterol less than 3 times per week. She reports that her asthma symptoms are worse due to her allergies. Her [name], has noted that she is more dyspneic on exertion. She had oxygen saturation of 92% and on walking dropped to about 90%.  I have reviewed all relevant imaging, labs & test data & reconciled meds   Past Medical History  Diagnosis Date  . Hypertension   . Diabetes mellitus     as an adult  . Hyperlipidemia   . Depression   . COPD  and ASTHMA   . DJD (degenerative joint disease)   . GERD (gastroesophageal reflux disease)     and HH w/ reflux  . History of colonic polyps   . Allergic rhinitis     Past Surgical History  Procedure Laterality Date  . Appendectomy    . Cataract extraction, bilateral    . Cholecystectomy    . Tubal ligation    . Tonsillectomy    . Abdominal hysterectomy      apparently no oophorectomy  . Bilateral breast tumor      No Known Allergies  History   Social History  . Marital Status: Widowed    Spouse Name: N/A  . Number of Children: 5  . Years of Education: N/A   Occupational History  . retired     Social History Main Topics  . Smoking status: Former Smoker -- 1.00 packs/day for 35 years    Types: Cigarettes    Quit date: 01/18/1993  . Smokeless tobacco: Never Used  .  Alcohol Use: No  . Drug Use: No  . Sexual Activity: Not on file   Other Topics Concern  . Not on file   Social History Narrative   widow (1994), children 5 (lost 3 sons,2 alive, daughters)   2 daughter lives w/ her, both single     [name] 81 y/o   GGchildren x 2     Family History  Problem Relation Age of Onset  . Breast cancer Sister     x 2  . Colon cancer Neg Hx   . Diabetes Father   . Diabetes Sister   . Heart attack Brother     early 2s  . Heart attack Son   . Pancreatic cancer Son   . Stomach cancer Sister      Review of Systems  Constitutional: Negative for fever, chills and unexpected weight change.  HENT: Negative for congestion, dental problem, ear pain, nosebleeds, postnasal drip, rhinorrhea, sinus pressure, sneezing, sore throat, trouble swallowing and voice change.   Eyes: Negative for visual disturbance.  Respiratory: Negative for cough, choking and shortness of breath.   Cardiovascular: Negative for chest pain and leg swelling.  Gastrointestinal: Negative for vomiting, abdominal pain and diarrhea.  Genitourinary: Negative for difficulty urinating.  Musculoskeletal: Negative  for arthralgias.  Skin: Negative for rash.  Neurological: Negative for tremors, syncope and headaches.  Hematological: Does not bruise/bleed easily.       Objective:   Physical Exam  Gen. Pleasant, well-nourished, in no distress, normal affect ENT - no lesions, no post nasal drip Neck: No JVD, no thyromegaly, no carotid bruits Lungs: no use of accessory muscles, no dullness to percussion, clear without rales or rhonchi  Cardiovascular: Rhythm regular, heart sounds  normal, no murmurs or gallops, no peripheral edema Abdomen: soft and non-tender, no hepatosplenomegaly, BS normal. Musculoskeletal: No deformities, no cyanosis or clubbing Neuro:  alert, non focal       Assessment & Plan:

## 2014-06-13 NOTE — Assessment & Plan Note (Signed)
Trial of stiolto instead of spiriva - call for Rx if this works

## 2014-06-15 ENCOUNTER — Other Ambulatory Visit: Payer: Self-pay | Admitting: Internal Medicine

## 2014-07-08 ENCOUNTER — Other Ambulatory Visit: Payer: Self-pay

## 2014-07-08 ENCOUNTER — Telehealth: Payer: Self-pay | Admitting: Internal Medicine

## 2014-07-08 MED ORDER — ALBUTEROL SULFATE HFA 108 (90 BASE) MCG/ACT IN AERS: 2.0000 | INHALATION_SPRAY | Freq: Four times a day (QID) | RESPIRATORY_TRACT | Status: DC | PRN

## 2014-07-08 NOTE — Telephone Encounter (Signed)
Caller name: Jaquana Dimond  Relation to [id number]  Call back number: [phone] Pharmacy:  [id number] [place], [place] [phone] (Phone) [phone] (Fax)        & [facility] [place], [place]  Reason for call:  Pt requesting  albuterol (VENTOLIN HFA) 108 (90 BASE) MCG/ACT inhaler please send to [facility] mail order and please send 1 month supply to [facility] [phone] to hold patient over until mail order is received.

## 2014-07-08 NOTE — Telephone Encounter (Signed)
[facility] as requested.

## 2014-07-15 ENCOUNTER — Telehealth: Payer: Self-pay | Admitting: Medical

## 2014-07-15 NOTE — Telephone Encounter (Signed)
Saw Dr. Elsworth Soho on 06/13/14

## 2014-07-15 NOTE — Telephone Encounter (Signed)
Pt had abnormal chest ct. She was to have repeat ct. Note in chart states Dr. Larose Kells referred to Pulmonologist. I had reminder to order ct of chest but since was supposed to see pulmonologist would defer work up to them. But will ask referral staff on status of referral

## 2014-07-29 ENCOUNTER — Telehealth: Payer: Self-pay | Admitting: Pulmonary Disease

## 2014-07-29 DIAGNOSIS — R918 Other nonspecific abnormal finding of lung field: Secondary | ICD-10-CM

## 2014-07-29 DIAGNOSIS — R634 Abnormal weight loss: Secondary | ICD-10-CM

## 2014-07-29 NOTE — Telephone Encounter (Signed)
Called and spoke to pt's daughter, [name]. [name] is questioning if pt should have CT chest and CT of abdomen/pelvis at the same time. Neither CT was order at time of last OV. Pt is schedule for a CT chest that was ordered by Debbrah Alar (CT chest scheduled for 7.14.16).   Dr. Elsworth Soho please advise if to still order CT abdomen/pelvis with oral contrast and if possible to do scans at the same time. Thanks.

## 2014-07-29 NOTE — Telephone Encounter (Signed)
OK to dos cans at same time Pl order CT abdomen/pelivs with PO contrast only

## 2014-07-30 NOTE — Telephone Encounter (Signed)
Called and spoke to [name]. Informed her of the scan. Rebecca Elliott verbalized understanding and is aware someone will contact her to get CT scheduled. Order pending.  Dr. Elsworth Soho please advise what to associate the CT abdomen/pelvis with. Thanks.

## 2014-07-31 NOTE — Telephone Encounter (Signed)
Multiple pulmonary nodules, r/o abdominal malignancy

## 2014-07-31 NOTE — Telephone Encounter (Signed)
-   Weight loss 

## 2014-07-31 NOTE — Telephone Encounter (Signed)
Dr Elsworth Soho- dx of MPN is not going to work  Please advise a different dx thanks!

## 2014-08-01 ENCOUNTER — Other Ambulatory Visit: Payer: Self-pay | Admitting: Pulmonary Disease

## 2014-08-01 ENCOUNTER — Other Ambulatory Visit (INDEPENDENT_AMBULATORY_CARE_PROVIDER_SITE_OTHER): Payer: Medicare Other

## 2014-08-01 ENCOUNTER — Ambulatory Visit (HOSPITAL_BASED_OUTPATIENT_CLINIC_OR_DEPARTMENT_OTHER)
Admission: RE | Admit: 2014-08-01 | Discharge: 2014-08-01 | Disposition: A | Discharge status: Home / Self Care | Payer: Medicare Other | Source: Ambulatory Visit | Attending: Family | Admitting: Family

## 2014-08-01 DIAGNOSIS — R634 Abnormal weight loss: Secondary | ICD-10-CM

## 2014-08-01 DIAGNOSIS — R918 Other nonspecific abnormal finding of lung field: Secondary | ICD-10-CM | POA: Diagnosis not present

## 2014-08-01 LAB — BASIC METABOLIC PANEL
BUN: 20 mg/dL (ref 6–23)
CALCIUM: 9.3 mg/dL (ref 8.4–10.5)
CO2: 25 meq/L (ref 19–32)
Chloride: 102 mEq/L (ref 96–112)
Creatinine, Ser: 1.49 mg/dL — ABNORMAL HIGH (ref 0.40–1.20)
GFR: 35.23 mL/min — ABNORMAL LOW (ref >60.00)
Glucose, Bld: 170 mg/dL — ABNORMAL HIGH (ref 70–99)
POTASSIUM: 4.2 meq/L (ref 3.5–5.1)
Sodium: 135 mEq/L (ref 135–145)

## 2014-08-01 NOTE — Telephone Encounter (Signed)
Dx has been associated with CT. Nothing further was needed at this time.

## 2014-08-05 ENCOUNTER — Telehealth: Payer: Self-pay | Admitting: Pulmonary Disease

## 2014-08-05 NOTE — Telephone Encounter (Signed)
Yes, Oral Contrast Only

## 2014-08-05 NOTE — Telephone Encounter (Signed)
Pt is scheduled for tomorrow Tues. 08/06/14 and [facility] is requesting an answer today. CT Abd/Pelvis is ordered with contrast which means IV contast and oral. CT Abd/Pelvis w/o contrast is done with oral contrast only. When you call you can ask for [facility] or [place]. Rhonda J Cobb

## 2014-08-05 NOTE — Telephone Encounter (Signed)
Order has been change to CT Abd/Pelvis without contrast to indicate no iv contrast, but oral contrast. Sharyn Lull left message on [facility]'s voice mail. Order change. No pre cert required. Nothing else needed at this time. Rhonda J Cobb

## 2014-08-06 ENCOUNTER — Ambulatory Visit (HOSPITAL_BASED_OUTPATIENT_CLINIC_OR_DEPARTMENT_OTHER)
Admission: RE | Admit: 2014-08-06 | Discharge: 2014-08-06 | Disposition: A | Discharge status: Home / Self Care | Payer: Medicare Other | Source: Ambulatory Visit | Attending: Pulmonary Disease | Admitting: Pulmonary Disease

## 2014-08-06 DIAGNOSIS — R634 Abnormal weight loss: Secondary | ICD-10-CM | POA: Diagnosis present

## 2014-08-06 DIAGNOSIS — K5790 Diverticulosis of intestine, part unspecified, without perforation or abscess without bleeding: Secondary | ICD-10-CM | POA: Insufficient documentation

## 2014-08-06 DIAGNOSIS — R590 Localized enlarged lymph nodes: Secondary | ICD-10-CM | POA: Insufficient documentation

## 2014-08-06 DIAGNOSIS — K5730 Diverticulosis of large intestine without perforation or abscess without bleeding: Secondary | ICD-10-CM | POA: Diagnosis not present

## 2014-08-06 DIAGNOSIS — I700 Atherosclerosis of aorta: Secondary | ICD-10-CM | POA: Diagnosis not present

## 2014-08-07 NOTE — Progress Notes (Signed)
Quick Note:  Spoke with pt's [name] and reviewed results and recs. Pt was scheduled with TP on 7/22 due to RA's schedule being full. Pt voiced understanding and had no further questions. ______

## 2014-08-09 ENCOUNTER — Encounter: Payer: Self-pay | Admitting: Adult Health

## 2014-08-09 ENCOUNTER — Ambulatory Visit (INDEPENDENT_AMBULATORY_CARE_PROVIDER_SITE_OTHER): Payer: Medicare Other | Admitting: Adult Health

## 2014-08-09 VITALS — BP 106/64 | HR 72 | Temp 97.9°F | Ht 66.0 in | Wt 200.0 lb

## 2014-08-09 DIAGNOSIS — J449 Chronic obstructive pulmonary disease, unspecified: Secondary | ICD-10-CM | POA: Diagnosis not present

## 2014-08-09 DIAGNOSIS — R918 Other nonspecific abnormal finding of lung field: Secondary | ICD-10-CM | POA: Diagnosis not present

## 2014-08-09 DIAGNOSIS — R590 Localized enlarged lymph nodes: Secondary | ICD-10-CM

## 2014-08-09 NOTE — Assessment & Plan Note (Addendum)
Compensated without flare Cont current regimen

## 2014-08-09 NOTE — Patient Instructions (Addendum)
Continue on current regimen .  We will set up CT chest to follow lung nodules in 6 months  We call regarding any additional testing  follow up Dr. Elsworth Soho  In 6 months with CT chest  And As needed

## 2014-08-09 NOTE — Progress Notes (Signed)
Subjective:    Patient ID: Rebecca Elliott, female    DOB: [birth year], 81 y.o.   MRN: [id number]  HPI 81 yo former smoker with abnormal CT. Patient was seen for pulmonary consult on 06/13/2014 for lung nodules.  08/09/2014 Follow up : Lung nodules/CT scan  Patient returns for a two-month follow-up. Patient was seen for pulmonary consult. Last visit for lung nodules. CT chest on April 2016 showed multiple small nodules with the largest being about 8 mm in the right lower lobe. These were new compared to a CT scan in 2005. Patient was set up for repeat CT chest 08/01/2014. CT showed multiple small bilateral pulmonary nodules that are essentially stable with the largest measuring 7 mm in the right lung base. CT abdomen on July 19 showed slight nonspecific periportal lymphadenopathy with several nodes, increased in size since 2005. Patient denies any chest pain, cough, hemoptysis, orthopnea, PND, leg swelling, unintentional weight loss. She does state that she has chronic hemorrhoids and occasionally has blood in her stool.  Colonoscopy and 2010 showed severe diverticulosis and internal hemorrhoids. No polyps or masses were seen. No further screening and was recommended. We reviewed her test results with patient and her daughter. She does have anemia and has been placed on iron from her primary care physician. Last visit was changed from [facility] to [facility] but says that she changed back to [facility].  Feels that her breathing is at baseline with no flare cough or wheezing.  Review of Systems Constitutional:   No  weight loss, night sweats,  Fevers, chills,  +fatigue, or  lassitude.  HEENT:   No headaches,  Difficulty swallowing,  Tooth/dental problems, or  Sore throat,                No sneezing, itching, ear ache, nasal congestion, post nasal drip,   CV:  No chest pain,  Orthopnea, PND, swelling in lower extremities, anasarca, dizziness, palpitations, syncope.   GI  No heartburn,  indigestion, abdominal pain, nausea, vomiting, diarrhea, change in bowel habits, loss of appetite,    Resp: No shortness of breath with exertion or at rest.  No excess mucus, no productive cough,  No non-productive cough,  No coughing up of blood.  No change in color of mucus.  No wheezing.  No chest wall deformity  Skin: no rash or lesions.  GU: no dysuria, change in color of urine, no urgency or frequency.  No flank pain, no hematuria   MS:  No joint pain or swelling.  No decreased range of motion.  No back pain.  Psych:  No change in mood or affect. No depression or anxiety.  No memory loss.          Objective:   Physical Exam  GEN: A/Ox3; pleasant , NAD, elderly   HEENT:  [place]/AT,  EACs-clear, TMs-wnl, NOSE-clear, THROAT-clear, no lesions, no postnasal drip or exudate noted.   NECK:  Supple w/ fair ROM; no JVD; normal carotid impulses w/o bruits; no thyromegaly or nodules palpated; no lymphadenopathy.  RESP  Clear  P & A; w/o, wheezes/ rales/ or rhonchi.no accessory muscle use, no dullness to percussion  CARD:  RRR, no m/r/g  , no peripheral edema, pulses intact, no cyanosis or clubbing.  GI:   Soft & nt; nml bowel sounds; no organomegaly or masses detected.  Musco: Warm bil, no deformities or joint swelling noted.   Neuro: alert, no focal deficits noted.    Skin: Warm, no lesions or rashes  Assessment & Plan:

## 2014-08-09 NOTE — Assessment & Plan Note (Signed)
CT chest shows stable multiple small bilateral pulmonary nodules We'll repeat CT in 6 months.

## 2014-08-15 ENCOUNTER — Telehealth: Payer: Self-pay | Admitting: *Deleted

## 2014-08-15 DIAGNOSIS — R590 Localized enlarged lymph nodes: Secondary | ICD-10-CM

## 2014-08-15 NOTE — Telephone Encounter (Signed)
Spoke with daughter and is aware referral placed GI. Nothing further needed

## 2014-08-15 NOTE — Assessment & Plan Note (Signed)
Refer to GI 

## 2014-08-16 ENCOUNTER — Encounter: Payer: Self-pay | Admitting: Physician Assistant

## 2014-08-29 ENCOUNTER — Encounter: Payer: Self-pay | Admitting: Physician Assistant

## 2014-08-29 ENCOUNTER — Other Ambulatory Visit (HOSPITAL_COMMUNITY): Payer: Self-pay | Admitting: Physician Assistant

## 2014-08-29 ENCOUNTER — Ambulatory Visit (INDEPENDENT_AMBULATORY_CARE_PROVIDER_SITE_OTHER): Payer: Medicare Other | Admitting: Physician Assistant

## 2014-08-29 ENCOUNTER — Telehealth: Payer: Self-pay

## 2014-08-29 VITALS — BP 130/58 | HR 70 | Ht 66.0 in | Wt 198.0 lb

## 2014-08-29 DIAGNOSIS — K649 Unspecified hemorrhoids: Secondary | ICD-10-CM | POA: Diagnosis not present

## 2014-08-29 DIAGNOSIS — R1310 Dysphagia, unspecified: Secondary | ICD-10-CM

## 2014-08-29 DIAGNOSIS — R590 Localized enlarged lymph nodes: Secondary | ICD-10-CM

## 2014-08-29 DIAGNOSIS — K219 Gastro-esophageal reflux disease without esophagitis: Secondary | ICD-10-CM | POA: Diagnosis not present

## 2014-08-29 DIAGNOSIS — R1314 Dysphagia, pharyngoesophageal phase: Secondary | ICD-10-CM

## 2014-08-29 MED ORDER — RANITIDINE HCL 150 MG PO TABS: 150.0000 mg | ORAL_TABLET | Freq: Every day | ORAL | Status: DC

## 2014-08-29 MED ORDER — HYDROCORTISONE 2.5 % RE CREA: 1.0000 "application " | TOPICAL_CREAM | Freq: Two times a day (BID) | RECTAL | Status: DC

## 2014-08-29 NOTE — Progress Notes (Signed)
Patient ID: Rebecca Elliott, female   DOB: 1926-02-26, 81 y.o.   MRN: [id number]    HPI:  Rebecca Elliott is a 81 y.o.   female  referred by Colon Branch, MD for evaluation of abdominal lymphadenopathy. Rebecca Elliott was previously followed by Dr. Sharlett Iles. She has had rectal bleeding in the past and has had an extensive workup in the past for iron deficiency anemia. For years she has been incontinent of stool on occasions and has known internal hemorrhoids and diverticulosis. She had a colonoscopy 09/09/2008 at which time severe diverticulosis was found sigmoid to descending colon. Internal hemorrhoids were noted. She had an EGD on 08/05/2004 due to dysphagia and a Hirst dilator was used 52 [place], no resistance, no heme present on extraction.  Patient states that last year she got up to close to 220 pounds. She states she went on a diet to lose weight and lost between 15 and 20 pounds. She states earlier this year she had the flu. She had an exacerbation of her COPD and was treated with ant biotics and prednisone. She had a chest x-ray that showed multiple nodules which were confirmed with CT. She then had a CT scan on July 19 on the abdomen and pelvis that showed aortic atherosclerosis, slight nonspecific. Portal lymphadenopathy with several notes increased in size since 2005, and diverticulosis of the distal colon. Review of the film shows that the gallbladder appeared been removed and was otherwise normal there is no mention of ductal dilatation, and this pancreas appeared normal. Patient denies abdominal pain, nausea, or vomiting. She continues to complain of intermittent fecal incontinence. She states her hemorrhoids have been very sore lately, and she has occasional scant blood on the toilet tissue if she wipes a lot. She also reports that she is choking when she tries to drink liquids. She states liquids often feel as if they want to go down the wrong pipe and she is afraid she will aspirate. She also has  intermittent dysphagia to dry foods. She has been on pantoprazole but has been taking it with supper instead of first thing in the morning and she notices that she has been getting breakthrough heartburn throughout the day with some nocturnal regurgitation. She states her appetite has been good and her weight has been stable. She has no fever, chills, or night sweats.   Past Medical History  Diagnosis Date  . Hypertension   . Diabetes mellitus     as an adult  . Hyperlipidemia   . Depression   . COPD  and ASTHMA   . DJD (degenerative joint disease)   . GERD (gastroesophageal reflux disease)     and HH w/ reflux  . History of colonic polyps   . Allergic rhinitis     Past Surgical History  Procedure Laterality Date  . Appendectomy    . Cataract extraction, bilateral    . Cholecystectomy    . Tubal ligation    . Tonsillectomy    . Abdominal hysterectomy      apparently no oophorectomy  . Bilateral breast tumor     Family History  Problem Relation Age of Onset  . Breast cancer Sister     x 2  . Colon cancer Neg Hx   . Diabetes Father   . Diabetes Sister   . Heart attack Brother     early 40s  . Heart attack Son   . Pancreatic cancer Son   . Stomach cancer Sister  Social History  Substance Use Topics  . Smoking status: Former Smoker -- 1.00 packs/day for 35 years    Types: Cigarettes    Quit date: 01/18/1993  . Smokeless tobacco: Never Used  . Alcohol Use: No   Current Outpatient Prescriptions  Medication Sig Dispense Refill  . albuterol (VENTOLIN HFA) 108 (90 BASE) MCG/ACT inhaler Inhale 2 puffs into the lungs every 6 (six) hours as needed. 3 Inhaler 1  . amLODipine (NORVASC) 10 MG tablet Take 1 tablet (10 mg total) by mouth daily. 90 tablet 2  . aspirin 81 MG tablet Take 81 mg by mouth daily.     Marland Kitchen atorvastatin (LIPITOR) 10 MG tablet Take 1 tablet (10 mg total) by mouth daily. 90 tablet 2  . dextromethorphan-guaiFENesin (MUCINEX DM) 30-600 MG per 12 hr tablet  Take 1 tablet by mouth daily.      Marland Kitchen docusate sodium (COLACE) 100 MG capsule Take 1-2 by mouth once a day.     . ferrous sulfate 325 (65 FE) MG tablet Take 325 mg by mouth 2 (two) times daily with a meal.    . loratadine (CLARITIN) 10 MG tablet Take 10 mg by mouth daily.      Marland Kitchen losartan-hydrochlorothiazide (HYZAAR) 100-12.5 MG per tablet Take 1 tablet by mouth daily. 90 tablet 2  . metFORMIN (GLUCOPHAGE) 500 MG tablet Take 2 tablets (1,000 mg total) by mouth daily with breakfast. 180 tablet 2  . metoprolol succinate (TOPROL-XL) 25 MG 24 hr tablet Take 1.5 tablets (37.5 mg total) by mouth daily. 135 tablet 2  . Multiple Vitamins-Minerals (CENTRUM SILVER) CHEW Chew by mouth.      . pantoprazole (PROTONIX) 40 MG tablet Take 1 tablet (40 mg total) by mouth daily. 90 tablet 2  . sitaGLIPtin (JANUVIA) 100 MG tablet Take 1 tablet (100 mg total) by mouth daily. 90 tablet 2  . tiotropium (SPIRIVA HANDIHALER) 18 MCG inhalation capsule Place 1 capsule (18 mcg total) into inhaler and inhale daily. 90 capsule 2  . Wheat Dextrin (BENEFIBER DRINK MIX PO) Take by mouth 2 (two) times daily.      . hydrocortisone (ANUSOL-HC) 2.5 % rectal cream Place 1 application rectally 2 (two) times daily. X 10 days 30 g 1  . ranitidine (ZANTAC) 150 MG tablet Take 1 tablet (150 mg total) by mouth at bedtime. 30 tablet 5   No current facility-administered medications for this visit.   No Known Allergies   Review of Systems: Per history of present illness otherwise negative.  Studies: Ct Abdomen Pelvis Wo Contrast  08/06/2014   CLINICAL DATA:  Unexplained weight loss. Multiple pulmonary nodules.  EXAM: CT ABDOMEN AND PELVIS WITHOUT CONTRAST  TECHNIQUE: Multidetector CT imaging of the abdomen and pelvis was performed following the standard protocol without IV contrast.  COMPARISON:  CT scans dated 08/01/2014 and 03/11/2003  FINDINGS: Lower chest: There are multiple pulmonary nodules at the lung bases as described on the prior  study of 08/01/2014 and these appear unchanged. Heart size is normal. Fairly extensive coronary artery calcifications. Unchanged slightly irregular fluid collection in the pericardial fat pad on the right.  Hepatobiliary: The gallbladder has been removed.  Otherwise normal.  Pancreas: Normal.  Spleen: Normal.  Adrenals/Urinary Tract: 13 mm nodule in the left adrenal gland, minimally changed since 2005. Normal right adrenal gland. 3.8 cm cyst in the upper pole of the right kidney, decreased in size since 2005. 16 mm low-density area in the lower pole the right kidney, most likely a cyst  but this is indeterminate on this unenhanced CT scan. Bladder is normal.  Stomach/Bowel: Multiple diverticula in the sigmoid portion of the colon. The bowel is otherwise normal.  Vascular/Lymphatic: Extensive calcification in the abdominal aorta and iliac arteries. There are several slightly prominent periportal lymph nodes which have increased slightly in size since 2005. They node anterior to the IVC on image 25 of series 2 has increased from 12 mm to 18 mm and a node anterior to the hepatic artery has increased from 9 mm to 16 mm. There are few small lymph nodes in the gastrohepatic space which are more prominent than 2005 but are not pathologically enlarged.  Reproductive: Uterus has been removed.  Ovaries are normal.  Other: No free air or free fluid.  Musculoskeletal: No acute abnormality. Diffuse degenerative disc disease in the lumbar spine.  IMPRESSION: 1. Aortic atherosclerosis. 1. Slight nonspecific periportal lymphadenopathy, with several nodes increased in size since 2005. 2. Diverticulosis of the distal colon.   Electronically Signed   By: Lorriane Shire M.D.   On: 08/06/2014 10:54   Ct Chest Wo Contrast  08/01/2014   CLINICAL DATA:  Follow-up of pulmonary nodules.  EXAM: CT CHEST WITHOUT CONTRAST  TECHNIQUE: Multidetector CT imaging of the chest was performed following the standard protocol without IV contrast.   COMPARISON:  Chest CTs dated 04/25/2014 and 03/11/2003  FINDINGS: There has been no change in most of the multiple small bilateral pulmonary nodules. The largest nodule is at the right lung base laterally measuring 7 mm, appearing slight diminished in size since the prior study. There is a 6.7 mm nodule at the left lung base adjacent to the diaphragm on image 44 of series 3.  There is no hilar or mediastinal adenopathy. Extensive calcification in the coronary arteries and thoracic aorta. Overall heart size is normal. Small metal loculated fluid in the right pericardial fat pad, unchanged. No acute abnormality in the upper abdomen. Diverticulosis in the splenic flexure of the colon. Cyst on the upper pole the left kidney, unchanged and density since the prior CT scan of 2005.  IMPRESSION: Multiple small bilateral pulmonary nodules, essentially stable since the prior study. I recommend an additional follow-up CT scan without contrast in 6 months.   Electronically Signed   By: Lorriane Shire M.D.   On: 08/01/2014 09:45    LAB RESULTS: AST 01/30/2014 35 ALT 01/30/2014 23  Prior Endoscopies:   See history of present illness  Physical Exam: BP 130/58 mmHg  Pulse 70  Ht 5\' 6"  (1.676 m)  Wt 198 lb (89.812 kg)  BMI 31.97 kg/m2 Constitutional: Pleasant,well-developed, elderly female in no acute distress. HEENT: Normocephalic and atraumatic. Conjunctivae are normal. No scleral icterus. Neck supple. No JVD Cardiovascular: Normal rate, regular rhythm.  Pulmonary/chest: Effort normal and breath sounds normal. No wheezing, rales or rhonchi. Abdominal: Soft, nondistended, nontender. Bowel sounds active throughout. There are no masses palpable. No hepatomegaly. Rectal: Large external hemorrhoids. Brown stool, Hemoccult-negative. Anoscopy with large internal hemorrhoids. Extremities: no edema Lymphadenopathy: No cervical adenopathy noted. Neurological: Alert and oriented to person place and time. Skin: Skin  is warm and dry. No rashes noted. Psychiatric: Normal mood and affect. Behavior is normal.  ASSESSMENT AND PLAN: #1. Dysphagia. Patient has a history of GERD and has required prior dilations. She seems to be sputtering more on liquids. Will schedule patient for a modified barium swallow to evaluate for possible transfer dysphagia. Patient has been instructed to use her pantoprazole 30 minutes prior to breakfast instead  of at 5:00. She will also try ranitidine 150 mg at bedtime.  #2. Rectal bleeding. This is likely due to her internal hemorrhoids. She will use Anusol HC cream twice a day for 10 days as needed. She's been instructed to use Tucks wipes as needed.  #3. Periportal lymphadenopathy. Nodes has been present back to 2005 and are only slightly increased. No hepatic mass or ductal dilatation noted on CT, and pancreas appears normal. We will check hepatic function panel now and again in 2 months.  Patient will follow up in 2 months at which time she would like to establish care with Dr. Ardis Hughs as the other members of her family are patients of his and would like her to be seen by him as well.   Dontray Haberland, Deloris Ping 08/29/2014, 11:53 AM  CC: Colon Branch, MD

## 2014-08-29 NOTE — Patient Instructions (Signed)
You have been scheduled for a modified barium swallow on 09/10/14 at 1pm. Please arrive 15 minutes prior to your test for registration. You will go to [facility] (1st Floor) for your appointment. Please refrain from eating or drinking anything 4 hours prior to your test. Should you need to cancel or reschedule your appointment, please contact [phone] [facility]) or [phone] [place]). _____________________________________________________________________ A Modified Barium Swallow Study, or MBS, is a special x-ray that is taken to check swallowing skills. It is carried out by a Stage manager and a Psychologist, clinical (SLP). During this test, yourmouth, throat, and esophagus, a muscular tube which connects your mouth to your stomach, is checked. The test will help you, your doctor, and the SLP plan what types of foods and liquids are easier for you to swallow. The SLP will also identify positions and ways to help you swallow more easily and safely. What will happen during an MBS? You will be taken to an x-ray room and seated comfortably. You will be asked to swallow small amounts of food and liquid mixed with barium. Barium is a liquid or paste that allows images of your mouth, throat and esophagus to be seen on x-ray. The x-ray captures moving images of the food you are swallowing as it travels from your mouth through your throat and into your esophagus. This test helps identify whether food or liquid is entering your lungs (aspiration). The test also shows which part of your mouth or throat lacks strength or coordination to move the food or liquid in the right direction. This test typically takes 30 minutes to 1 hour to complete. _______________________________________________________________________   We have sent the following medications to your pharmacy for you to pick up at your convenience: Anusol and Ranitidine.  Change your Protonix to 20-30 mins before  breakfast Tucks wipes after bowel movements.   Your physician has requested that you go to the basement for the following lab work around the first of October  Follow-up with Dr. Ardis Hughs 11/01/14 at 2:15 pm

## 2014-08-29 NOTE — Telephone Encounter (Signed)
-----   Message from Vita Barley Hvozdovic, PA-C sent at 08/29/2014 12:12 PM EDT ----- Can you please have patient have a hepatic function panel drawn next week? She will probably want to have that drawn at the lab at [facility] as she lives very close to that facility.

## 2014-08-29 NOTE — Progress Notes (Signed)
I agree with the above note, plan 

## 2014-08-29 NOTE — Telephone Encounter (Signed)
The pt has been scheduled to go to [facility] for labs 09/03/14 1:15 pm pt daughter is aware

## 2014-09-02 ENCOUNTER — Other Ambulatory Visit (HOSPITAL_COMMUNITY): Payer: Self-pay | Admitting: Physician Assistant

## 2014-09-02 DIAGNOSIS — R1314 Dysphagia, pharyngoesophageal phase: Secondary | ICD-10-CM

## 2014-09-03 ENCOUNTER — Other Ambulatory Visit (INDEPENDENT_AMBULATORY_CARE_PROVIDER_SITE_OTHER): Payer: Medicare Other

## 2014-09-03 DIAGNOSIS — K649 Unspecified hemorrhoids: Secondary | ICD-10-CM

## 2014-09-03 DIAGNOSIS — K219 Gastro-esophageal reflux disease without esophagitis: Secondary | ICD-10-CM

## 2014-09-03 DIAGNOSIS — R590 Localized enlarged lymph nodes: Secondary | ICD-10-CM

## 2014-09-03 DIAGNOSIS — R1310 Dysphagia, unspecified: Secondary | ICD-10-CM | POA: Diagnosis not present

## 2014-09-04 LAB — HEPATIC FUNCTION PANEL
ALT: 30 U/L (ref 0–35)
AST: 59 U/L — ABNORMAL HIGH (ref 0–37)
Albumin: 3.7 g/dL (ref 3.5–5.2)
Alkaline Phosphatase: 62 U/L (ref 39–117)
BILIRUBIN DIRECT: 0.1 mg/dL (ref 0.0–0.3)
BILIRUBIN TOTAL: 0.2 mg/dL (ref 0.2–1.2)
Total Protein: 8 g/dL (ref 6.0–8.3)

## 2014-09-06 ENCOUNTER — Other Ambulatory Visit: Payer: Self-pay | Admitting: *Deleted

## 2014-09-06 DIAGNOSIS — R945 Abnormal results of liver function studies: Secondary | ICD-10-CM

## 2014-09-06 DIAGNOSIS — R7989 Other specified abnormal findings of blood chemistry: Secondary | ICD-10-CM

## 2014-09-10 ENCOUNTER — Ambulatory Visit (HOSPITAL_COMMUNITY): Payer: Medicare Other

## 2014-09-12 ENCOUNTER — Ambulatory Visit (HOSPITAL_COMMUNITY)
Admission: RE | Admit: 2014-09-12 | Discharge: 2014-09-12 | Disposition: A | Discharge status: Home / Self Care | Payer: Medicare Other | Source: Ambulatory Visit | Attending: Physician Assistant | Admitting: Physician Assistant

## 2014-09-12 DIAGNOSIS — R1314 Dysphagia, pharyngoesophageal phase: Secondary | ICD-10-CM

## 2014-09-12 DIAGNOSIS — K224 Dyskinesia of esophagus: Secondary | ICD-10-CM | POA: Diagnosis not present

## 2014-09-12 DIAGNOSIS — R1310 Dysphagia, unspecified: Secondary | ICD-10-CM

## 2014-09-12 DIAGNOSIS — K219 Gastro-esophageal reflux disease without esophagitis: Secondary | ICD-10-CM

## 2014-09-12 NOTE — Procedures (Signed)
Objective Swallowing Evaluation: Other (Comment)  Patient Details  Name: Rebecca Elliott MRN: [id number] Date of Birth: 12/02/1926  Today's Date: 09/12/2014 Time: SLP Start Time (ACUTE ONLY): 1310-SLP Stop Time (ACUTE ONLY): 1340 SLP Time Calculation (min) (ACUTE ONLY): 30 min  Past Medical History:  Past Medical History  Diagnosis Date  . Hypertension   . Diabetes mellitus     as an adult  . Hyperlipidemia   . Depression   . COPD  and ASTHMA   . DJD (degenerative joint disease)   . GERD (gastroesophageal reflux disease)     and HH w/ reflux  . History of colonic polyps   . Allergic rhinitis    Past Surgical History:  Past Surgical History  Procedure Laterality Date  . Appendectomy    . Cataract extraction, bilateral    . Cholecystectomy    . Tubal ligation    . Tonsillectomy    . Abdominal hysterectomy      apparently no oophorectomy  . Bilateral breast tumor     HPI:  Other Pertinent Information: 80 yo female referred by GI for MBS and esophagram due to pt complaint of dysphagia/choking on coarse foods mostly. Pt compensates by avoiding coarse foods or assuring they are adequately moistened. Pt also admits to choking on liquids at very beginning of meal only  - approximately 50% of the time.  Pt has recently had the flu and per daughter "spots" were seen on CXR prompting dysphagia work up.  Pt admits to dysphagia for many years but admits has worsened in the last few years.  She did require heimlich maneuver x1 many years ago.  No weight loss unintentional reported.  PMH + for DM, HLD, hiatal hernia, GERD, abdominal lymphadenopathy, allergic rhinitis.    No Data Recorded  Assessment / Plan / Recommendation CHL IP CLINICAL IMPRESSIONS 09/12/2014  Therapy Diagnosis WFL  Clinical Impression Pt presents with functional oropharyngeal swallow with timely and strong swallow.  No residuals noted nor aspiration/penetration of any consistency tested. SLP only tested thin, nectar,  pudding and cracker to allow barium tablet to be tested with esophagram to follow.  Pt does report xerostomia and SLP provided compensations in writing for mitigation. Also pt reports she has not had to use Tums in the evenings since changing her PPI to am administration.    Thanks for this referral.       No flowsheet data found.   CHL IP DIET RECOMMENDATION 09/12/2014  SLP Diet Recommendations Age appropriate regular solids;Thin  Liquid Administration via Cup, straw  Medication Administration Whole meds with liquid  Compensations Slow rate;Small sips/bites  Postural Changes and/or Swallow Maneuvers Stay upright after meals     CHL IP OTHER RECOMMENDATIONS 09/12/2014  Recommended Consults (None)  Oral Care Recommendations Oral care BID  Other Recommendations (None)         CHL IP REASON FOR REFERRAL 09/12/2014  Reason for Referral Objectively evaluate swallowing function     CHL IP ORAL PHASE 09/12/2014  Oral Phase WFL      CHL IP PHARYNGEAL PHASE 09/12/2014  Pharyngeal Phase WFL      CHL IP CERVICAL ESOPHAGEAL PHASE 09/12/2014  Cervical Esophageal Phase Impaired  Nectar Cup Prominent cricopharyngeal segment  Thin Cup Prominent cricopharyngeal segment  Thin Straw Prominent cricopharyngeal segment  Cervical Esophageal Comment prominent cricopharyngeus - did not impact transit of barium or boluses    CHL IP GO 09/12/2014  Functional Assessment Tool Used MBS, clinical judgement  Functional Limitations Swallowing  Swallow Current Status [id number]) CI  Swallow Goal Status [id number]) CI  Swallow Discharge Status [phone]) [place], [facility] [facility], [place] SLP [phone]

## 2014-10-03 DIAGNOSIS — Z961 Presence of intraocular lens: Secondary | ICD-10-CM | POA: Diagnosis not present

## 2014-10-03 DIAGNOSIS — E119 Type 2 diabetes mellitus without complications: Secondary | ICD-10-CM | POA: Diagnosis not present

## 2014-10-03 DIAGNOSIS — H10413 Chronic giant papillary conjunctivitis, bilateral: Secondary | ICD-10-CM | POA: Diagnosis not present

## 2014-10-03 DIAGNOSIS — H26491 Other secondary cataract, right eye: Secondary | ICD-10-CM | POA: Diagnosis not present

## 2014-10-03 DIAGNOSIS — H04123 Dry eye syndrome of bilateral lacrimal glands: Secondary | ICD-10-CM | POA: Diagnosis not present

## 2014-10-03 LAB — HM DIABETES EYE EXAM

## 2014-10-09 ENCOUNTER — Encounter: Payer: Self-pay | Admitting: Gastroenterology

## 2014-10-23 ENCOUNTER — Ambulatory Visit: Payer: Medicare Other | Admitting: Internal Medicine

## 2014-10-28 ENCOUNTER — Other Ambulatory Visit: Payer: Self-pay

## 2014-10-28 MED ORDER — AMLODIPINE BESYLATE 10 MG PO TABS: 10.0000 mg | ORAL_TABLET | Freq: Every day | ORAL | Status: DC

## 2014-11-01 ENCOUNTER — Ambulatory Visit: Payer: Medicare Other | Admitting: Gastroenterology

## 2014-11-05 ENCOUNTER — Encounter: Payer: Self-pay | Admitting: Gastroenterology

## 2014-11-05 ENCOUNTER — Ambulatory Visit: Payer: Medicare Other | Admitting: Internal Medicine

## 2014-11-12 ENCOUNTER — Telehealth: Payer: Self-pay | Admitting: Pulmonary Disease

## 2014-11-12 NOTE — Telephone Encounter (Signed)
I have the [facility] and the [name] is aware of appt and location

## 2014-11-19 ENCOUNTER — Encounter: Payer: Self-pay | Admitting: Internal Medicine

## 2014-11-19 ENCOUNTER — Ambulatory Visit (INDEPENDENT_AMBULATORY_CARE_PROVIDER_SITE_OTHER): Payer: Medicare Other | Admitting: Internal Medicine

## 2014-11-19 VITALS — BP 122/76 | HR 70 | Temp 97.7°F | Ht 66.0 in | Wt 197.4 lb

## 2014-11-19 DIAGNOSIS — I10 Essential (primary) hypertension: Secondary | ICD-10-CM | POA: Diagnosis not present

## 2014-11-19 DIAGNOSIS — E785 Hyperlipidemia, unspecified: Secondary | ICD-10-CM | POA: Diagnosis not present

## 2014-11-19 DIAGNOSIS — E119 Type 2 diabetes mellitus without complications: Secondary | ICD-10-CM

## 2014-11-19 DIAGNOSIS — D649 Anemia, unspecified: Secondary | ICD-10-CM | POA: Diagnosis not present

## 2014-11-19 DIAGNOSIS — Z23 Encounter for immunization: Secondary | ICD-10-CM

## 2014-11-19 DIAGNOSIS — Z09 Encounter for follow-up examination after completed treatment for conditions other than malignant neoplasm: Secondary | ICD-10-CM

## 2014-11-19 LAB — BASIC METABOLIC PANEL
BUN: 29 mg/dL — AB (ref 6–23)
CALCIUM: 9.8 mg/dL (ref 8.4–10.5)
CO2: 31 meq/L (ref 19–32)
Chloride: 102 mEq/L (ref 96–112)
Creatinine, Ser: 1.28 mg/dL — ABNORMAL HIGH (ref 0.40–1.20)
GFR: 41.95 mL/min — ABNORMAL LOW (ref >60.00)
GLUCOSE: 150 mg/dL — AB (ref 70–99)
POTASSIUM: 3.9 meq/L (ref 3.5–5.1)
SODIUM: 140 meq/L (ref 135–145)

## 2014-11-19 LAB — LIPID PANEL
CHOLESTEROL: 116 mg/dL (ref 0–200)
HDL: 32.1 mg/dL — ABNORMAL LOW (ref >39.00)
LDL CALC: 59 mg/dL (ref 0–99)
NonHDL: 83.67
TRIGLYCERIDES: 125 mg/dL (ref 0.0–149.0)
Total CHOL/HDL Ratio: 4
VLDL: 25 mg/dL (ref 0.0–40.0)

## 2014-11-19 LAB — IRON: Iron: 103 ug/dL (ref 42–145)

## 2014-11-19 LAB — AST: AST: 27 U/L (ref 0–37)

## 2014-11-19 LAB — CBC WITH DIFFERENTIAL/PLATELET
BASOS PCT: 0.4 % (ref 0.0–3.0)
Basophils Absolute: 0 10*3/uL (ref 0.0–0.1)
EOS PCT: 2.6 % (ref 0.0–5.0)
Eosinophils Absolute: 0.3 10*3/uL (ref 0.0–0.7)
HEMATOCRIT: 41.3 % (ref 36.0–46.0)
Hemoglobin: 13.3 g/dL (ref 12.0–15.0)
LYMPHS PCT: 39 % (ref 12.0–46.0)
Lymphs Abs: 4.1 10*3/uL — ABNORMAL HIGH (ref 0.7–4.0)
MCHC: 32.3 g/dL (ref 30.0–36.0)
MCV: 84.4 fl (ref 78.0–100.0)
MONOS PCT: 8.2 % (ref 3.0–12.0)
Monocytes Absolute: 0.9 10*3/uL (ref 0.1–1.0)
NEUTROS ABS: 5.2 10*3/uL (ref 1.4–7.7)
Neutrophils Relative %: 49.8 % (ref 43.0–77.0)
PLATELETS: 268 10*3/uL (ref 150.0–400.0)
RBC: 4.89 Mil/uL (ref 3.87–5.11)
RDW: 13.9 % (ref 11.5–15.5)
WBC: 10.5 10*3/uL (ref 4.0–10.5)

## 2014-11-19 LAB — FERRITIN: Ferritin: 21.5 ng/mL (ref 10.0–291.0)

## 2014-11-19 LAB — HEMOGLOBIN A1C: Hgb A1c MFr Bld: 6.5 % (ref 4.6–6.5)

## 2014-11-19 LAB — ALT: ALT: 18 U/L (ref 0–35)

## 2014-11-19 NOTE — Progress Notes (Signed)
Subjective:    Patient ID: Rebecca Elliott, female    DOB: [birth year], 81 y.o.   MRN: [id number]  DOS:  11/19/2014 Type of visit - description :  Interval history: Diabetes: Good compliance of medication, no ambulatory CBGs HTN: Good compliance of medicines, BP today is very good. Pulmonary nodules-- Pulmonary note reviewed, CT shows stable nodules, plan is to repeat in 6 months Note from GI reviewed 08/29/2014: She had dysphagia, recommended a barium swallow (showed dysmotility) , PPIs. Also CT show periportal lymphadenopathies, they felt LADs were there before. They recommended a follow-up with Dr. Ardis Hughs..  Review of Systems  Denies chest pain, difficulty breathing or lower extremity edema. DOE at baseline No blood in the stools no abdominal pain, still has occasional nausea and mild dysphagia. Improved compared to a few months ago.   Past Medical History  Diagnosis Date  . Hypertension   . Diabetes mellitus     as an adult  . Hyperlipidemia   . Depression   . COPD  and ASTHMA   . DJD (degenerative joint disease)   . GERD (gastroesophageal reflux disease)     and HH w/ reflux  . History of colonic polyps   . Allergic rhinitis     Past Surgical History  Procedure Laterality Date  . Appendectomy    . Cataract extraction, bilateral    . Cholecystectomy    . Tubal ligation    . Tonsillectomy    . Abdominal hysterectomy      apparently no oophorectomy  . Bilateral breast tumor      Social History   Social History  . Marital Status: Widowed    Spouse Name: N/A  . Number of Children: 5  . Years of Education: N/A   Occupational History  . retired     Social History Main Topics  . Smoking status: Former Smoker -- 1.00 packs/day for 35 years    Types: Cigarettes    Quit date: 01/18/1993  . Smokeless tobacco: Never Used  . Alcohol Use: No  . Drug Use: No  . Sexual Activity: Not on file   Other Topics Concern  . Not on file   Social History Narrative   widow (1994), children 5 (lost 3 sons,2 alive, daughters)   2 daughter lives w/ her, both single     [name] 81 y/o   GGchildren x 2         Medication List       This list is accurate as of: 11/19/14  5:51 PM.  Always use your most recent med list.               albuterol 108 (90 BASE) MCG/ACT inhaler  Commonly known as:  VENTOLIN HFA  Inhale 2 puffs into the lungs every 6 (six) hours as needed.     amLODipine 10 MG tablet  Commonly known as:  NORVASC  Take 1 tablet (10 mg total) by mouth daily.     aspirin 81 MG tablet  Take 81 mg by mouth daily.     atorvastatin 10 MG tablet  Commonly known as:  LIPITOR  Take 1 tablet (10 mg total) by mouth daily.     BENEFIBER DRINK MIX PO  Take by mouth 2 (two) times daily.     CENTRUM SILVER Chew  Chew by mouth.     dextromethorphan-guaiFENesin 30-600 MG 12hr tablet  Commonly known as:  MUCINEX DM  Take 1 tablet by mouth daily.  docusate sodium 100 MG capsule  Commonly known as:  COLACE  Take 1-2 by mouth once a day.     ferrous sulfate 325 (65 FE) MG tablet  Take 325 mg by mouth 2 (two) times daily with a meal.     hydrocortisone 2.5 % rectal cream  Commonly known as:  ANUSOL-HC  Place 1 application rectally 2 (two) times daily. X 10 days     loratadine 10 MG tablet  Commonly known as:  CLARITIN  Take 10 mg by mouth daily.     losartan-hydrochlorothiazide 100-12.5 MG tablet  Commonly known as:  HYZAAR  Take 1 tablet by mouth daily.     metFORMIN 500 MG tablet  Commonly known as:  GLUCOPHAGE  Take 2 tablets (1,000 mg total) by mouth daily with breakfast.     metoprolol succinate 25 MG 24 hr tablet  Commonly known as:  TOPROL-XL  Take 1.5 tablets (37.5 mg total) by mouth daily.     pantoprazole 40 MG tablet  Commonly known as:  PROTONIX  Take 1 tablet (40 mg total) by mouth daily.     ranitidine 150 MG tablet  Commonly known as:  ZANTAC  Take 1 tablet (150 mg total) by mouth at bedtime.     sitaGLIPtin  100 MG tablet  Commonly known as:  JANUVIA  Take 1 tablet (100 mg total) by mouth daily.     tiotropium 18 MCG inhalation capsule  Commonly known as:  SPIRIVA HANDIHALER  Place 1 capsule (18 mcg total) into inhaler and inhale daily.           Objective:   Physical Exam BP 122/76 mmHg  Pulse 70  Temp(Src) 97.7 F (36.5 C) (Oral)  Ht 5\' 6"  (1.676 m)  Wt 197 lb 6 oz (89.529 kg)  BMI 31.87 kg/m2  SpO2 96% General:   Well developed, well nourished . NAD.  HEENT:  Normocephalic . Face symmetric, atraumatic Lungs:  Decreased breath sounds but otherwise clear  Normal respiratory effort, no intercostal retractions, no accessory muscle use. Heart: RRR,  no murmur.  No pretibial edema bilaterally  Diabetic foot exam: No edema, good pedal pulses, pinprick examination normal Neurologic:  alert & oriented X3.  Speech normal, gait appropriate for age and unassisted Psych--  Cognition and judgment appear intact.  Cooperative with normal attention span and concentration.  Behavior appropriate. No anxious or depressed appearing.      Assessment & Plan:    Assessment > DM--no neuropathy HTN Hyperlipidemia Depression COPD - Asthma Pulmonary nodules per CXR 03-2014, CT 04/25/2014 confirmatory,saw pulmonary Anemia, chronic, on-off iron def  Colonoscopy 2004: No polyps EGD 2006 for dysphagia showed hiatal hernia, will call to stricture? Colonoscopy 2010 showed diverticuli Dysphagia: Barium swallow showed dysmotility 08-2014 DJD GERD  Plan: DM: Continue present care, no neuropathy on today's exam, check A1c HTN: Seems well-controlled, check a BMP. High cholesterol: on Lipitor, labs. LFTs have been minimally elevated on and off. Recheck LFTs as well. Anemia: On and off, chronic anemia. Recently seen by GI but anemia not addressed.  Was recommended to follow-up by GI. Check CBC, iron, ferritin Dysphagia: Recent BS showed dysmotility. RTC 4 months

## 2014-11-19 NOTE — Progress Notes (Signed)
Pre visit review using our clinic review tool, if applicable. No additional management support is needed unless otherwise documented below in the visit note. 

## 2014-11-19 NOTE — Patient Instructions (Signed)
Get your blood work before you leave   Next visit  for a   physical exam in 4 months, not fasting.  Please schedule an appointment at the front desk

## 2014-11-19 NOTE — Assessment & Plan Note (Signed)
DM: Continue present care, no neuropathy on today's exam, check A1c HTN: Seems well-controlled, check a BMP. High cholesterol: on Lipitor, labs. LFTs have been minimally elevated on and off. Recheck LFTs as well. Anemia: On and off, chronic anemia. Recently seen by GI but anemia not addressed.  Was recommended to follow-up by GI. Check CBC, iron, ferritin Dysphagia: Recent BS showed dysmotility. RTC 4 months

## 2014-12-07 ENCOUNTER — Other Ambulatory Visit: Payer: Self-pay | Admitting: Internal Medicine

## 2014-12-31 ENCOUNTER — Ambulatory Visit: Payer: Medicare Other | Admitting: Gastroenterology

## 2015-01-08 ENCOUNTER — Ambulatory Visit: Payer: Medicare Other | Admitting: Gastroenterology

## 2015-01-16 ENCOUNTER — Ambulatory Visit (INDEPENDENT_AMBULATORY_CARE_PROVIDER_SITE_OTHER): Payer: Medicare Other | Admitting: Family Medicine

## 2015-01-16 ENCOUNTER — Encounter: Payer: Self-pay | Admitting: Family Medicine

## 2015-01-16 VITALS — BP 130/64 | HR 69 | Temp 97.9°F | Ht 66.0 in | Wt 203.2 lb

## 2015-01-16 DIAGNOSIS — L84 Corns and callosities: Secondary | ICD-10-CM | POA: Diagnosis not present

## 2015-01-16 NOTE — Progress Notes (Signed)
Pre visit review using our clinic review tool, if applicable. No additional management support is needed unless otherwise documented below in the visit note. 

## 2015-01-16 NOTE — Patient Instructions (Signed)
Corns and Calluses Corns are small areas of thickened skin that occur on the top, sides, or tip of a toe. They contain a cone-shaped core with a point that can press on a nerve below. This causes pain. Calluses are areas of thickened skin that can occur anywhere on the body including hands, fingers, palms, soles of the feet, and heels.Calluses are usually larger than corns.  CAUSES  Corns and calluses are caused by rubbing (friction) or pressure, such as from shoes that are too tight or do not fit properly.  RISK FACTORS Corns are more likely to develop in people who have toe deformities, such as hammer toes. Since calluses can occur with friction to any area of the skin, calluses are more likely to develop in people who:   Work with their hands.  Wear shoes that fit poorly, shoes that are too tight, or shoes that are high-heeled.  Have toes deformities. SYMPTOMS Symptoms of a corn or callus include:  A hard growth on the skin.   Pain or tenderness under the skin.   Redness and swelling.   Increased discomfort while wearing tight-fitting shoes. DIAGNOSIS  Corns and calluses may be diagnosed with a medical history and physical exam.  TREATMENT  Corns and calluses may be treated with:  Removing the cause of the friction or pressure. This may include:  Changing your shoes.  Wearing shoe inserts (orthotics) or other protective layers in your shoes, such as a corn pad.  Wearing gloves.  Medicines to help soften skin in the hardened, thickened areas.  Reducing the size of the corn or callus by removing the dead layers of skin.  Antibiotic medicines to treat infection.  Surgery, if a toe deformity is the cause. HOME CARE INSTRUCTIONS   Take medicines only as directed by your health care provider.  If you were prescribed an antibiotic, finish all of it even if you start to feel better.  Wear shoes that fit well. Avoid wearing high-heeled shoes and shoes that are too tight  or too loose.  Wear any padding, protective layers, gloves, or orthotics as directed by your health care provider.  Soak your hands or feet and then use a file or pumice stone to soften your corn or callus. Do this as directed by your health care provider.  Check your corn or callus every day for signs of infection. Watch for:  Redness, swelling, or pain.  Fluid, blood, or pus. SEEK MEDICAL CARE IF:   Your symptoms do not improve with treatment.  You have increased redness, swelling, or pain at the site of your corn or callus.  You have fluid, blood, or pus coming from your corn or callus.  You have new symptoms.   This information is not intended to replace advice given to you by your health care provider. Make sure you discuss any questions you have with your health care provider.   Document Released: 10/11/2003 Document Revised: 05/21/2014 Document Reviewed: 12/31/2013 Elsevier Interactive Patient Education 2016 [employer]  

## 2015-01-16 NOTE — Progress Notes (Signed)
Subjective:    Patient ID: Rebecca Elliott, female    DOB: 1926-11-25, 81 y.o.   MRN: [id number]  Chief Complaint  Patient presents with  . Foot Pain    L foot pain on ball of foot w/ weight bearing x1 month     HPI Patient is in today for c/o pain L foot--? Plantar wart vs callous.  No other complaints.    Past Medical History  Diagnosis Date  . Hypertension   . Diabetes mellitus     as an adult  . Hyperlipidemia   . Depression   . COPD  and ASTHMA   . DJD (degenerative joint disease)   . GERD (gastroesophageal reflux disease)     and HH w/ reflux  . History of colonic polyps   . Allergic rhinitis     Past Surgical History  Procedure Laterality Date  . Appendectomy    . Cataract extraction, bilateral    . Cholecystectomy    . Tubal ligation    . Tonsillectomy    . Abdominal hysterectomy      apparently no oophorectomy  . Bilateral breast tumor      Family History  Problem Relation Age of Onset  . Breast cancer Sister     x 2  . Colon cancer Neg Hx   . Diabetes Father   . Diabetes Sister   . Heart attack Brother     early 58s  . Heart attack Son   . Pancreatic cancer Son   . Stomach cancer Sister     Social History   Social History  . Marital Status: Widowed    Spouse Name: N/A  . Number of Children: 5  . Years of Education: N/A   Occupational History  . retired     Social History Main Topics  . Smoking status: Former Smoker -- 1.00 packs/day for 35 years    Types: Cigarettes    Quit date: 01/18/1993  . Smokeless tobacco: Never Used  . Alcohol Use: No  . Drug Use: No  . Sexual Activity: Not on file   Other Topics Concern  . Not on file   Social History Narrative   widow (1994), children 5 (lost 3 sons,2 alive, daughters)   2 daughter lives w/ her, both single     [name] 81 y/o   GGchildren x 2     Outpatient Prescriptions Prior to Visit  Medication Sig Dispense Refill  . albuterol (VENTOLIN HFA) 108 (90 BASE) MCG/ACT inhaler Inhale  2 puffs into the lungs every 6 (six) hours as needed for wheezing or shortness of breath. 54 g 3  . amLODipine (NORVASC) 10 MG tablet Take 1 tablet (10 mg total) by mouth daily. 90 tablet 0  . aspirin 81 MG tablet Take 81 mg by mouth daily.     Marland Kitchen atorvastatin (LIPITOR) 10 MG tablet Take 1 tablet (10 mg total) by mouth daily. 90 tablet 2  . dextromethorphan-guaiFENesin (MUCINEX DM) 30-600 MG per 12 hr tablet Take 1 tablet by mouth daily.      Marland Kitchen docusate sodium (COLACE) 100 MG capsule Take 1-2 by mouth once a day.     . ferrous sulfate 325 (65 FE) MG tablet Take 325 mg by mouth 2 (two) times daily with a meal.    . hydrocortisone (ANUSOL-HC) 2.5 % rectal cream Place 1 application rectally 2 (two) times daily. X 10 days 30 g 1  . loratadine (CLARITIN) 10 MG tablet Take 10 mg  by mouth daily.      Marland Kitchen losartan-hydrochlorothiazide (HYZAAR) 100-12.5 MG per tablet Take 1 tablet by mouth daily. 90 tablet 2  . metFORMIN (GLUCOPHAGE) 500 MG tablet Take 2 tablets (1,000 mg total) by mouth daily with breakfast. 180 tablet 2  . metoprolol succinate (TOPROL-XL) 25 MG 24 hr tablet Take 1.5 tablets (37.5 mg total) by mouth daily. 135 tablet 2  . Multiple Vitamins-Minerals (CENTRUM SILVER) CHEW Chew by mouth.      . pantoprazole (PROTONIX) 40 MG tablet Take 1 tablet (40 mg total) by mouth daily. 90 tablet 2  . ranitidine (ZANTAC) 150 MG tablet Take 1 tablet (150 mg total) by mouth at bedtime. 30 tablet 5  . sitaGLIPtin (JANUVIA) 100 MG tablet Take 1 tablet (100 mg total) by mouth daily. 90 tablet 2  . tiotropium (SPIRIVA HANDIHALER) 18 MCG inhalation capsule Place 1 capsule (18 mcg total) into inhaler and inhale daily. 90 capsule 2  . Wheat Dextrin (BENEFIBER DRINK MIX PO) Take by mouth 2 (two) times daily.       No facility-administered medications prior to visit.    No Known Allergies  Review of Systems  Constitutional: Negative for fever, chills and malaise/fatigue.  HENT: Negative for congestion and  hearing loss.   Eyes: Negative for discharge.  Respiratory: Negative for cough, sputum production and shortness of breath.   Cardiovascular: Negative for chest pain, palpitations and leg swelling.  Gastrointestinal: Negative for heartburn, nausea, vomiting, abdominal pain, diarrhea, constipation and blood in stool.  Genitourinary: Negative for dysuria, urgency, frequency and hematuria.  Musculoskeletal: Negative for myalgias, back pain and falls.  Skin: Negative for rash.  Neurological: Negative for dizziness, sensory change, loss of consciousness, weakness and headaches.  Endo/Heme/Allergies: Negative for environmental allergies. Does not bruise/bleed easily.  Psychiatric/Behavioral: Negative for depression and suicidal ideas. The patient is not nervous/anxious and does not have insomnia.        Objective:    Physical Exam  Constitutional: She is oriented to person, place, and time. She appears well-developed and well-nourished.  HENT:  Head: Normocephalic and atraumatic.  Eyes: Conjunctivae and EOM are normal.  Neck: Normal range of motion. Neck supple. No JVD present. Carotid bruit is not present. No thyromegaly present.  Cardiovascular: Normal rate, regular rhythm and normal heart sounds.   No murmur heard. Pulmonary/Chest: Effort normal and breath sounds normal. No respiratory distress. She has no wheezes. She has no rales. She exhibits no tenderness.  Musculoskeletal: She exhibits no edema.       Feet:  Neurological: She is alert and oriented to person, place, and time.  Psychiatric: She has a normal mood and affect.  Nursing note and vitals reviewed.   BP 130/64 mmHg  Pulse 69  Temp(Src) 97.9 F (36.6 C) (Oral)  Ht 5\' 6"  (1.676 m)  Wt 203 lb 3.2 oz (92.171 kg)  BMI 32.81 kg/m2  SpO2 97% Wt Readings from Last 3 Encounters:  01/16/15 203 lb 3.2 oz (92.171 kg)  11/19/14 197 lb 6 oz (89.529 kg)  08/29/14 198 lb (89.812 kg)     Lab Results  Component Value Date    WBC 10.5 11/19/2014   HGB 13.3 11/19/2014   HCT 41.3 11/19/2014   PLT 268.0 11/19/2014   GLUCOSE 150* 11/19/2014   CHOL 116 11/19/2014   TRIG 125.0 11/19/2014   HDL 32.10* 11/19/2014   LDLCALC 59 11/19/2014   ALT 18 11/19/2014   AST 27 11/19/2014   NA 140 11/19/2014   K 3.9  11/19/2014   CL 102 11/19/2014   CREATININE 1.28* 11/19/2014   BUN 29* 11/19/2014   CO2 31 11/19/2014   TSH 2.13 11/09/2012   HGBA1C 6.5 11/19/2014   MICROALBUR 47.7* 03/10/2010    Lab Results  Component Value Date   TSH 2.13 11/09/2012   Lab Results  Component Value Date   WBC 10.5 11/19/2014   HGB 13.3 11/19/2014   HCT 41.3 11/19/2014   MCV 84.4 11/19/2014   PLT 268.0 11/19/2014   Lab Results  Component Value Date   NA 140 11/19/2014   K 3.9 11/19/2014   CO2 31 11/19/2014   GLUCOSE 150* 11/19/2014   BUN 29* 11/19/2014   CREATININE 1.28* 11/19/2014   BILITOT 0.2 09/03/2014   ALKPHOS 62 09/03/2014   AST 27 11/19/2014   ALT 18 11/19/2014   PROT 8.0 09/03/2014   ALBUMIN 3.7 09/03/2014   CALCIUM 9.8 11/19/2014   GFR 41.95* 11/19/2014   Lab Results  Component Value Date   CHOL 116 11/19/2014   Lab Results  Component Value Date   HDL 32.10* 11/19/2014   Lab Results  Component Value Date   LDLCALC 59 11/19/2014   Lab Results  Component Value Date   TRIG 125.0 11/19/2014   Lab Results  Component Value Date   CHOLHDL 4 11/19/2014   Lab Results  Component Value Date   HGBA1C 6.5 11/19/2014       Assessment & Plan:   Problem List Items Addressed This Visit    None    Visit Diagnoses    Pre-ulcerative corn or callous    -  Primary    Relevant Orders    Ambulatory referral to Podiatry       I am having Ms. Lovena Le maintain her aspirin, Wheat Dextrin (BENEFIBER DRINK MIX PO), CENTRUM SILVER, loratadine, docusate sodium, dextromethorphan-guaiFENesin, metFORMIN, atorvastatin, sitaGLIPtin, tiotropium, losartan-hydrochlorothiazide, metoprolol succinate, pantoprazole, ferrous  sulfate, hydrocortisone, ranitidine, amLODipine, and albuterol.  No orders of the defined types were placed in this encounter.     Garnet Koyanagi, DO

## 2015-01-20 DIAGNOSIS — M7752 Other enthesopathy of left foot: Secondary | ICD-10-CM | POA: Diagnosis not present

## 2015-01-20 DIAGNOSIS — M71572 Other bursitis, not elsewhere classified, left ankle and foot: Secondary | ICD-10-CM | POA: Diagnosis not present

## 2015-01-20 DIAGNOSIS — M2580 Other specified joint disorders, unspecified joint: Secondary | ICD-10-CM | POA: Diagnosis not present

## 2015-01-20 DIAGNOSIS — L97521 Non-pressure chronic ulcer of other part of left foot limited to breakdown of skin: Secondary | ICD-10-CM | POA: Diagnosis not present

## 2015-01-23 DIAGNOSIS — M7752 Other enthesopathy of left foot: Secondary | ICD-10-CM | POA: Diagnosis not present

## 2015-01-23 DIAGNOSIS — M2580 Other specified joint disorders, unspecified joint: Secondary | ICD-10-CM | POA: Diagnosis not present

## 2015-01-24 ENCOUNTER — Other Ambulatory Visit: Payer: Self-pay

## 2015-01-24 ENCOUNTER — Other Ambulatory Visit: Payer: Self-pay | Admitting: Internal Medicine

## 2015-01-24 MED ORDER — METFORMIN HCL 500 MG PO TABS: 1000.0000 mg | ORAL_TABLET | Freq: Every day | ORAL | Status: DC

## 2015-02-03 ENCOUNTER — Other Ambulatory Visit: Payer: Medicare Other

## 2015-02-04 ENCOUNTER — Ambulatory Visit (HOSPITAL_BASED_OUTPATIENT_CLINIC_OR_DEPARTMENT_OTHER)
Admission: RE | Admit: 2015-02-04 | Discharge: 2015-02-04 | Disposition: A | Discharge status: Home / Self Care | Payer: Medicare Other | Source: Ambulatory Visit | Attending: Adult Health | Admitting: Adult Health

## 2015-02-04 DIAGNOSIS — R918 Other nonspecific abnormal finding of lung field: Secondary | ICD-10-CM | POA: Insufficient documentation

## 2015-02-04 DIAGNOSIS — I700 Atherosclerosis of aorta: Secondary | ICD-10-CM | POA: Insufficient documentation

## 2015-02-04 DIAGNOSIS — R911 Solitary pulmonary nodule: Secondary | ICD-10-CM | POA: Diagnosis not present

## 2015-02-04 NOTE — Progress Notes (Signed)
Quick Note:  Called # [id number] and her driver answered stating the best # to reach her at is [phone]. I called the # given by the driver above and discussed the results and recs. Pt voiced understanding and had no further questions. ______

## 2015-02-05 ENCOUNTER — Encounter: Payer: Self-pay | Admitting: Gastroenterology

## 2015-02-05 ENCOUNTER — Ambulatory Visit (INDEPENDENT_AMBULATORY_CARE_PROVIDER_SITE_OTHER): Payer: Medicare Other | Admitting: Gastroenterology

## 2015-02-05 VITALS — BP 118/58 | HR 64 | Ht 63.78 in | Wt 200.4 lb

## 2015-02-05 DIAGNOSIS — R1310 Dysphagia, unspecified: Secondary | ICD-10-CM

## 2015-02-05 NOTE — Patient Instructions (Signed)
Chew your food well and eat slowly and take small bites Moisten bread like foods with gravy, butter or drippings. Please call if your swallowing worsens.

## 2015-02-05 NOTE — Progress Notes (Signed)
HPI: This is a  Very pleasant 82 year old woman whom I'm meeting for the first time  Was here  This past summer and she saw Cecille Rubin for some swallowing difficulties. Barium esophagram and modified barium swallow testing were done. See those results below  Certain foods (corn bread) is still problematic if she doesn't put butter on it  She has been trying to be careful, eating slower, taking small bites and she and her daughter agree that things are improved  Has gained a bit of weight over the holidays.  No liquid dysphagia.   Barium esophagram 08/2014: 1. Nonspecific esophageal dysmotility disorder, with all swallows disrupted in the upper to mid thoracic esophagus. Tertiary contractions and spasm noted in the distal esophagus intermittently. 2. Distal esophageal fold thickening which may reflect esophagitis. No ulceration was observed. 3. Cervical spondylosis.  08/2014 MBSS Pt presents with functional oropharyngeal swallow with timely and strong swallow. No residuals noted nor aspiration/penetration of any consistency tested. SLP only tested thin, nectar, pudding and cracker to allow barium tablet to be tested with esophagram to follow. Pt does report xerostomia and SLP provided compensations in writing for mitigation. Also pt reports she has not had to use Tums in the evenings since changing her PPI to am administration.    Chief complaint is dysphasia   Past Medical History  Diagnosis Date  . Hypertension   . Diabetes mellitus     as an adult  . Hyperlipidemia   . Depression   . COPD  and ASTHMA   . DJD (degenerative joint disease)   . GERD (gastroesophageal reflux disease)     and HH w/ reflux  . History of colonic polyps   . Allergic rhinitis     Past Surgical History  Procedure Laterality Date  . Appendectomy    . Cataract extraction, bilateral    . Cholecystectomy    . Tubal ligation    . Tonsillectomy    . Abdominal hysterectomy      apparently no  oophorectomy  . Bilateral breast tumor      Current Outpatient Prescriptions  Medication Sig Dispense Refill  . albuterol (VENTOLIN HFA) 108 (90 BASE) MCG/ACT inhaler Inhale 2 puffs into the lungs every 6 (six) hours as needed for wheezing or shortness of breath. 54 g 3  . amLODipine (NORVASC) 10 MG tablet Take 1 tablet (10 mg total) by mouth daily. 90 tablet 0  . aspirin 81 MG tablet Take 81 mg by mouth daily.     Marland Kitchen atorvastatin (LIPITOR) 10 MG tablet Take 1 tablet (10 mg total) by mouth daily. 90 tablet 2  . dextromethorphan-guaiFENesin (MUCINEX DM) 30-600 MG per 12 hr tablet Take 1 tablet by mouth daily.      Marland Kitchen docusate sodium (COLACE) 100 MG capsule Take 1-2 by mouth once a day.     . loratadine (CLARITIN) 10 MG tablet Take 10 mg by mouth daily.      Marland Kitchen losartan-hydrochlorothiazide (HYZAAR) 100-12.5 MG per tablet Take 1 tablet by mouth daily. 90 tablet 2  . metFORMIN (GLUCOPHAGE) 500 MG tablet Take 2 tablets (1,000 mg total) by mouth daily with breakfast. 180 tablet 1  . metoprolol succinate (TOPROL-XL) 25 MG 24 hr tablet Take 1.5 tablets (37.5 mg total) by mouth daily. 135 tablet 2  . Multiple Vitamins-Minerals (CENTRUM SILVER) CHEW Chew by mouth.      . pantoprazole (PROTONIX) 40 MG tablet Take 1 tablet (40 mg total) by mouth daily. 90 tablet 2  .  ranitidine (ZANTAC) 150 MG tablet Take 1 tablet (150 mg total) by mouth at bedtime. 30 tablet 5  . sitaGLIPtin (JANUVIA) 100 MG tablet Take 1 tablet (100 mg total) by mouth daily. 90 tablet 2  . tiotropium (SPIRIVA HANDIHALER) 18 MCG inhalation capsule Place 1 capsule (18 mcg total) into inhaler and inhale daily. 90 capsule 2  . Wheat Dextrin (BENEFIBER DRINK MIX PO) Take by mouth 2 (two) times daily.       No current facility-administered medications for this visit.    Allergies as of 02/05/2015  . (No Known Allergies)    Family History  Problem Relation Age of Onset  . Breast cancer Sister     x 2  . Colon cancer Neg Hx   .  Diabetes Father   . Diabetes Sister   . Heart attack Brother     early 12s  . Heart attack Son   . Pancreatic cancer Son   . Stomach cancer Sister     Social History   Social History  . Marital Status: Widowed    Spouse Name: N/A  . Number of Children: 5  . Years of Education: N/A   Occupational History  . retired     Social History Main Topics  . Smoking status: Former Smoker -- 1.00 packs/day for 35 years    Types: Cigarettes    Quit date: 01/18/1993  . Smokeless tobacco: Never Used  . Alcohol Use: No  . Drug Use: No  . Sexual Activity: Not on file   Other Topics Concern  . Not on file   Social History Narrative   widow (1994), children 5 (lost 3 sons,2 alive, daughters)   2 daughter lives w/ her, both single     [age] y/o   GGchildren x 2      Physical Exam: BP 118/58 mmHg  Pulse 64  Ht 5' 3.78" (1.62 m)  Wt 200 lb 6 oz (90.89 kg)  BMI 34.63 kg/m2 Constitutional: generally well-appearing Psychiatric: alert and oriented x3 Abdomen: soft, nontender, nondistended, no obvious ascites, no peritoneal signs, normal bowel sounds   Assessment and plan: 82 y.o. female with dysphagia  She has a slight motility disturbance based on barium esophagram, she seems to compensated for this very well by choosing particular foods, moistening bread-like products with butter or gravy. She has been eating slower taking smaller bites and I commended her on this and recommended she continue it. She is not losing weight, her swallowing difficulty seems to be fairly mild now that she is focusing more on her eating.   Owens Loffler, MD [facility] 02/05/2015, 4:01 PM

## 2015-02-18 ENCOUNTER — Ambulatory Visit (INDEPENDENT_AMBULATORY_CARE_PROVIDER_SITE_OTHER): Payer: Medicare Other | Admitting: Pulmonary Disease

## 2015-02-18 ENCOUNTER — Encounter: Payer: Self-pay | Admitting: Pulmonary Disease

## 2015-02-18 VITALS — BP 148/92 | HR 67 | Ht 66.24 in | Wt 198.2 lb

## 2015-02-18 DIAGNOSIS — J449 Chronic obstructive pulmonary disease, unspecified: Secondary | ICD-10-CM

## 2015-02-18 DIAGNOSIS — R918 Other nonspecific abnormal finding of lung field: Secondary | ICD-10-CM

## 2015-02-18 NOTE — Progress Notes (Signed)
   Subjective:    Patient ID: Rebecca Elliott, female    DOB: 06/04/1926, 82 y.o.   MRN: [id number]  HPI   82 year old remote smoker for FU of  multiple pulmonary nodules- first noted 04/2014 after an acute illness.  She smoked about 30-pack-years starting in her 22s until she retired from [employer] & quit in her 85s.  02/18/2015  Chief Complaint  Patient presents with  . Follow-up    Discuss CT Results from 02/04/15.    Saw GI for dysphagia - mild motility issues  She reports minimal cough  She takes Spiriva daily, uses albuterol less than 3 times per week. She reports that her asthma symptoms are worse due to her allergies.  Dyspnea is stable, drives She was changed from Spiriva to [facility] but changed back to Spiriva.  Feels that her breathing is at baseline with no flare cough or wheezing.  She had oxygen saturation of 92% and on walking dropped to about 90%  Significant tests/ events CT chest 04/2014 showed multiple small nodules with the largest being about 8 mm in the right lower lobe. These nodules were new when compared to a CT from 02/2003.   07/2014 CT showed multiple small bilateral pulmonary nodules that are essentially stable with the largest measuring 7 mm in the right lung base. CT abdomen 07/2014 nonspecific periportal lymphadenopathy with several nodes, increased in size since 2005.  Colonoscopy - 2010 showed severe diverticulosis and internal hemorrhoids.   Review of Systems  neg for any significant sore throat, dysphagia, itching, sneezing, nasal congestion or excess/ purulent secretions, fever, chills, sweats, unintended wt loss, pleuritic or exertional cp, hempoptysis, orthopnea pnd or change in chronic leg swelling. Also denies presyncope, palpitations, heartburn, abdominal pain, nausea, vomiting, diarrhea or change in bowel or urinary habits, dysuria,hematuria, rash, arthralgias, visual complaints, headache, numbness weakness or ataxia.     Objective:   Physical Exam   Gen. Pleasant, well-nourished,younger than age ENT - no lesions, no post nasal drip Neck: No JVD, no thyromegaly, no carotid bruits Lungs: no use of accessory muscles, no dullness to percussion, clear without rales or rhonchi  Cardiovascular: Rhythm regular, heart sounds  normal, no murmurs or gallops, no peripheral edema Musculoskeletal: No deformities, no cyanosis or clubbing         Assessment & Plan:

## 2015-02-18 NOTE — Assessment & Plan Note (Signed)
Ct spiriva  

## 2015-02-18 NOTE — Assessment & Plan Note (Signed)
Nodules look stable Repeat CT scan in 1 year  Greater than 50% time was spent in counseling and coordination of care with the patient

## 2015-02-18 NOTE — Patient Instructions (Signed)
Nodules look stable Repeat CT scan in 1 year

## 2015-02-27 ENCOUNTER — Other Ambulatory Visit: Payer: Self-pay | Admitting: Physician Assistant

## 2015-02-27 ENCOUNTER — Other Ambulatory Visit: Payer: Self-pay | Admitting: Internal Medicine

## 2015-03-08 ENCOUNTER — Other Ambulatory Visit: Payer: Self-pay | Admitting: Physician Assistant

## 2015-03-09 ENCOUNTER — Other Ambulatory Visit: Payer: Self-pay | Admitting: Internal Medicine

## 2015-03-15 ENCOUNTER — Other Ambulatory Visit: Payer: Self-pay | Admitting: Internal Medicine

## 2015-03-20 ENCOUNTER — Other Ambulatory Visit: Payer: Self-pay

## 2015-03-20 DIAGNOSIS — Z1231 Encounter for screening mammogram for malignant neoplasm of breast: Secondary | ICD-10-CM

## 2015-04-07 ENCOUNTER — Ambulatory Visit
Admission: RE | Admit: 2015-04-07 | Discharge: 2015-04-07 | Disposition: A | Discharge status: Home / Self Care | Payer: Medicare Other | Source: Ambulatory Visit

## 2015-04-07 ENCOUNTER — Other Ambulatory Visit: Payer: Self-pay | Admitting: Internal Medicine

## 2015-04-07 DIAGNOSIS — Z1231 Encounter for screening mammogram for malignant neoplasm of breast: Secondary | ICD-10-CM | POA: Diagnosis not present

## 2015-04-14 ENCOUNTER — Other Ambulatory Visit: Payer: Self-pay | Admitting: Internal Medicine

## 2015-04-23 ENCOUNTER — Other Ambulatory Visit: Payer: Self-pay | Admitting: Internal Medicine

## 2015-04-23 ENCOUNTER — Telehealth: Payer: Self-pay

## 2015-04-23 NOTE — Telephone Encounter (Signed)
Pre visit call completed 

## 2015-04-24 ENCOUNTER — Ambulatory Visit (INDEPENDENT_AMBULATORY_CARE_PROVIDER_SITE_OTHER): Payer: Medicare Other | Admitting: Internal Medicine

## 2015-04-24 ENCOUNTER — Encounter: Payer: Self-pay | Admitting: Internal Medicine

## 2015-04-24 VITALS — BP 118/70 | HR 70 | Temp 98.0°F | Ht 66.0 in | Wt 201.4 lb

## 2015-04-24 DIAGNOSIS — E119 Type 2 diabetes mellitus without complications: Secondary | ICD-10-CM | POA: Diagnosis not present

## 2015-04-24 DIAGNOSIS — I10 Essential (primary) hypertension: Secondary | ICD-10-CM

## 2015-04-24 DIAGNOSIS — D649 Anemia, unspecified: Secondary | ICD-10-CM | POA: Diagnosis not present

## 2015-04-24 DIAGNOSIS — J449 Chronic obstructive pulmonary disease, unspecified: Secondary | ICD-10-CM

## 2015-04-24 DIAGNOSIS — L989 Disorder of the skin and subcutaneous tissue, unspecified: Secondary | ICD-10-CM | POA: Diagnosis not present

## 2015-04-24 DIAGNOSIS — Z09 Encounter for follow-up examination after completed treatment for conditions other than malignant neoplasm: Secondary | ICD-10-CM

## 2015-04-24 DIAGNOSIS — Z0000 Encounter for general adult medical examination without abnormal findings: Secondary | ICD-10-CM

## 2015-04-24 LAB — CBC WITH DIFFERENTIAL/PLATELET
BASOS PCT: 0.4 % (ref 0.0–3.0)
Basophils Absolute: 0 10*3/uL (ref 0.0–0.1)
EOS PCT: 1.4 % (ref 0.0–5.0)
Eosinophils Absolute: 0.2 10*3/uL (ref 0.0–0.7)
HEMATOCRIT: 40.7 % (ref 36.0–46.0)
HEMOGLOBIN: 13.2 g/dL (ref 12.0–15.0)
LYMPHS PCT: 29.1 % (ref 12.0–46.0)
Lymphs Abs: 3.2 10*3/uL (ref 0.7–4.0)
MCHC: 32.5 g/dL (ref 30.0–36.0)
MCV: 83.2 fl (ref 78.0–100.0)
MONO ABS: 0.8 10*3/uL (ref 0.1–1.0)
MONOS PCT: 7.6 % (ref 3.0–12.0)
Neutro Abs: 6.8 10*3/uL (ref 1.4–7.7)
Neutrophils Relative %: 61.5 % (ref 43.0–77.0)
Platelets: 279 10*3/uL (ref 150.0–400.0)
RBC: 4.89 Mil/uL (ref 3.87–5.11)
RDW: 13.7 % (ref 11.5–15.5)
WBC: 11.1 10*3/uL — AB (ref 4.0–10.5)

## 2015-04-24 LAB — BASIC METABOLIC PANEL
BUN: 28 mg/dL — AB (ref 6–23)
CHLORIDE: 101 meq/L (ref 96–112)
CO2: 32 mEq/L (ref 19–32)
Calcium: 9.8 mg/dL (ref 8.4–10.5)
Creatinine, Ser: 1.53 mg/dL — ABNORMAL HIGH (ref 0.40–1.20)
GFR: 34.11 mL/min — AB (ref >60.00)
Glucose, Bld: 124 mg/dL — ABNORMAL HIGH (ref 70–99)
Potassium: 4.1 mEq/L (ref 3.5–5.1)
Sodium: 139 mEq/L (ref 135–145)

## 2015-04-24 LAB — TSH: TSH: 2.82 u[IU]/mL (ref 0.35–4.50)

## 2015-04-24 LAB — HEMOGLOBIN A1C: HEMOGLOBIN A1C: 6.6 % — AB (ref 4.6–6.5)

## 2015-04-24 NOTE — Assessment & Plan Note (Signed)
DM: Continue metformin, Januvia, check A1c and TSH HTN: Continue amlodipine, Hyzaar, Toprol. Check a BMP.  Hyperlipidemia: Continue Lipitor, last FLP satisfactory COPD asthma: Well controlled Anemia: Recheck labs. GERD: On PPIs and Zantac. Recommend to decrease PPIs to every other day. Consider DC PPIs. Multiple skin lesions, patient quite concerned, refer to dermatology. Recently seen with foot pain, much improved with a shoe insert prescribed by podiatry. RTC 6 months

## 2015-04-24 NOTE — Progress Notes (Signed)
Pre visit review using our clinic review tool, if applicable. No additional management support is needed unless otherwise documented below in the visit note. 

## 2015-04-24 NOTE — Progress Notes (Signed)
Subjective:    Patient ID: Rebecca Elliott, female    DOB: Mar 04, 1926, 82 y.o.   MRN: [id number]  DOS:  04/24/2015 Type of visit - description :  Interval history:   Here for Medicare AWV: 1. Risk factors based on Past M, S, F history: reviewed 2. Physical Activities:  no routine exercise, active w/ ADL 3. Depression/mood:  Neg screening 4. Hearing: some decrease hearing, stable, declines referral 5. ADL's: totally independent, still drives   6. Fall Risk: no recent falls, prevention discussed   7. home Safety: does feel safe at home   8. Height, weight, &visual acuity: see VS, sees eye doctor routinely   9. Counseling: provided 10. Labs ordered based on risk factors: if needed   11. Referral Coordination: if needed 12.  Care Plan, see assessment and plan   13.   Cognitive Assessment: motor and cognition seem appropriate for age   49. Team care updated 15. Written plan of care provided 16. Discussed HC-POA, MOST papers  provided   In addition, today we discussed the following: HTN: Good compliance of medication, not ambulatory BPs Diabetes: Good compliance with meds, no ambulatory blood sugars COPD: Good med compliance, essentially asx. GERD: on 2 meds medications, asymptomatic. Had foot pain, saw the podiatrist, doing better.  Review of Systems  Constitutional: No fever. No chills. No unexplained wt changes. No unusual sweats  HEENT: No dental problems, no ear discharge, no facial swelling, no voice changes. No eye discharge, no eye  redness , no  intolerance to light   Respiratory: No wheezing , no  difficulty breathing. No cough , no mucus production  Cardiovascular: No CP, no leg swelling , no  Palpitations.  GI: no nausea, no vomiting, no diarrhea , no  abdominal pain.  No blood in the stools. No dysphagia, no odynophagia    Endocrine: No polyphagia, no polyuria , no polydipsia  GU: No dysuria, gross hematuria, difficulty urinating. No urinary urgency, no  frequency.  Musculoskeletal: No joint swellings or unusual aches or pains  Skin: No change in the color of the skin, palor , no  Rash  Allergic, immunologic: No environmental allergies , no  food allergies  Neurological: No dizziness no  syncope. No headaches. No diplopia, no slurred, no slurred speech, no motor deficits, no facial  Numbness  Hematological: No enlarged lymph nodes, no easy bruising , no unusual bleedings  Psychiatry: No suicidal ideas, no hallucinations, no beavior problems, no confusion.  No unusual/severe anxiety, no depression    Past Medical History  Diagnosis Date  . Hypertension   . Diabetes mellitus     as an adult  . Hyperlipidemia   . Depression   . COPD  and ASTHMA   . DJD (degenerative joint disease)   . GERD (gastroesophageal reflux disease)     and HH w/ reflux  . History of colonic polyps   . Allergic rhinitis     Past Surgical History  Procedure Laterality Date  . Appendectomy    . Cataract extraction, bilateral    . Cholecystectomy    . Tubal ligation    . Tonsillectomy    . Abdominal hysterectomy      apparently no oophorectomy  . Bilateral breast tumor      Social History   Social History  . Marital Status: Widowed    Spouse Name: N/A  . Number of Children: 5  . Years of Education: N/A   Occupational History  . retired  Social History Main Topics  . Smoking status: Former Smoker -- 1.00 packs/day for 35 years    Types: Cigarettes    Quit date: 01/18/1993  . Smokeless tobacco: Never Used  . Alcohol Use: No  . Drug Use: No  . Sexual Activity: Not on file   Other Topics Concern  . Not on file   Social History Narrative   widow (1994), children 5 (lost 3 sons,2 alive, daughters)   2 daughter lives w/ her, both single     [name] 80 y/o   GGchildren x 2    Family History  Problem Relation Age of Onset  . Breast cancer Sister     x 2  . Colon cancer Neg Hx   . Diabetes Father   . Diabetes Sister   . Heart  attack Brother     early 71s  . Heart attack Son   . Pancreatic cancer Son   . Stomach cancer Sister         Medication List       This list is accurate as of: 04/24/15  3:15 PM.  Always use your most recent med list.               albuterol 108 (90 Base) MCG/ACT inhaler  Commonly known as:  VENTOLIN HFA  Inhale 2 puffs into the lungs every 6 (six) hours as needed for wheezing or shortness of breath.     amLODipine 10 MG tablet  Commonly known as:  NORVASC  Take 1 tablet (10 mg total) by mouth daily.     aspirin 81 MG tablet  Take 81 mg by mouth daily.     atorvastatin 10 MG tablet  Commonly known as:  LIPITOR  Take 1 tablet (10 mg total) by mouth daily.     BENEFIBER DRINK MIX PO  Take by mouth 2 (two) times daily.     CENTRUM SILVER Chew  Chew by mouth.     dextromethorphan-guaiFENesin 30-600 MG 12hr tablet  Commonly known as:  MUCINEX DM  Take 1 tablet by mouth daily.     docusate sodium 100 MG capsule  Commonly known as:  COLACE  Take 1-2 by mouth once a day.     loratadine 10 MG tablet  Commonly known as:  CLARITIN  Take 10 mg by mouth daily.     losartan-hydrochlorothiazide 100-12.5 MG tablet  Commonly known as:  HYZAAR  Take 1 tablet by mouth daily.     metFORMIN 500 MG tablet  Commonly known as:  GLUCOPHAGE  Take 2 tablets (1,000 mg total) by mouth daily with breakfast.     metoprolol succinate 25 MG 24 hr tablet  Commonly known as:  TOPROL-XL  Take 1.5 tablets (37.5 mg total) by mouth daily.     pantoprazole 40 MG tablet  Commonly known as:  PROTONIX  Take 1 tablet (40 mg total) by mouth daily.     ranitidine 150 MG tablet  Commonly known as:  ZANTAC  TAKE 1 TABLET (150 MG TOTAL) BY MOUTH AT BEDTIME.     sitaGLIPtin 100 MG tablet  Commonly known as:  JANUVIA  Take 1 tablet (100 mg total) by mouth daily.     tiotropium 18 MCG inhalation capsule  Commonly known as:  SPIRIVA HANDIHALER  Place 1 capsule (18 mcg total) into inhaler and  inhale daily.           Objective:   Physical Exam BP 118/70 mmHg  Pulse 70  Temp(Src) 98 F (36.7 C) (Oral)  Ht 5\' 6"  (1.676 m)  Wt 201 lb 6 oz (91.343 kg)  BMI 32.52 kg/m2  SpO2 95% General:   Well developed, well nourished . NAD.  Neck: No  Thyromegaly  HEENT:  Normocephalic . Face symmetric, atraumatic Lungs:  CTA B Normal respiratory effort, no intercostal retractions, no accessory muscle use. Heart: RRR,  no murmur.  No pretibial edema bilaterally  Abdomen:  Not distended, soft, non-tender. No rebound or rigidity.   Skin: multiple skin lesions, SKs, tags, others. Patient concerned Neurologic:  alert & oriented X3.  Speech normal, gait appropriate for age and unassisted Strength symmetric and appropriate for age.  Psych: Cognition and judgment appear intact.  Cooperative with normal attention span and concentration.  Behavior appropriate. No anxious or depressed appearing.     Assessment & Plan:   Assessment > DM--no neuropathy HTN Hyperlipidemia Depression COPD - Asthma Pulmonary nodules per CXR 03-2014, CT 04/25/2014 confirmatory,saw pulmonary >> re do CT 1 year Anemia, chronic, on-off iron def  Colonoscopy 2004: No polyps EGD 2006 for dysphagia showed hiatal hernia, occult  stricture? Colonoscopy 2010 showed diverticuli Dysphagia: Barium swallow showed dysmotility 08-2014 DJD GERD  Plan: DM: Continue metformin, Januvia, check A1c and TSH HTN: Continue amlodipine, Hyzaar, Toprol. Check a BMP.  Hyperlipidemia: Continue Lipitor, last FLP satisfactory COPD asthma: Well controlled Anemia: Recheck labs. GERD: On PPIs and Zantac. Recommend to decrease PPIs to every other day. Consider DC PPIs. Multiple skin lesions, patient quite concerned, refer to dermatology. Recently seen with foot pain, much improved with a shoe insert prescribed by podiatry. RTC 6 months

## 2015-04-24 NOTE — Assessment & Plan Note (Addendum)
Td 2012; pneumonia shot 2010; prevnar-- 2016; shingles shot-- 9829  82 year old lady 2 sisters w/ breast cancer, clinical breast exam2016 neg  Nl  mammogram  2016 and 03-2015  Cervical cancer screening: No further Paps  Cscope 2004 : tics, hemorrhoids  colonoscopyt 08/2008 : tics, no polyps   bone density test 10-2006 and 06-2009 normal, vitamin D normal 2011. No need to repeat a DEXA per literature   Diet-exercise discussed

## 2015-04-24 NOTE — Patient Instructions (Addendum)
Get your blood work before you leave    Decrease pantoprazole to every other day, continue with ranitidine  Next visit 6 months    Fall Prevention and Home Safety Falls cause injuries and can affect all age groups. It is possible to use preventive measures to significantly decrease the likelihood of falls. There are many simple measures which can make your home safer and prevent falls. OUTDOORS  Repair cracks and edges of walkways and driveways.  Remove high doorway thresholds.  Trim shrubbery on the main path into your home.  Have good outside lighting.  Clear walkways of tools, rocks, debris, and clutter.  Check that handrails are not broken and are securely fastened. Both sides of steps should have handrails.  Have leaves, snow, and ice cleared regularly.  Use sand or salt on walkways during winter months.  In the garage, clean up grease or oil spills. BATHROOM  Install night lights.  Install grab bars by the toilet and in the tub and shower.  Use non-skid mats or decals in the tub or shower.  Place a plastic non-slip stool in the shower to sit on, if needed.  Keep floors dry and clean up all water on the floor immediately.  Remove soap buildup in the tub or shower on a regular basis.  Secure bath mats with non-slip, double-sided rug tape.  Remove throw rugs and tripping hazards from the floors. BEDROOMS  Install night lights.  Make sure a bedside light is easy to reach.  Do not use oversized bedding.  Keep a telephone by your bedside.  Have a firm chair with side arms to use for getting dressed.  Remove throw rugs and tripping hazards from the floor. KITCHEN  Keep handles on pots and pans turned toward the center of the stove. Use back burners when possible.  Clean up spills quickly and allow time for drying.  Avoid walking on wet floors.  Avoid hot utensils and knives.  Position shelves so they are not too high or low.  Place commonly used  objects within easy reach.  If necessary, use a sturdy step stool with a grab bar when reaching.  Keep electrical cables out of the way.  Do not use floor polish or wax that makes floors slippery. If you must use wax, use non-skid floor wax.  Remove throw rugs and tripping hazards from the floor. STAIRWAYS  Never leave objects on stairs.  Place handrails on both sides of stairways and use them. Fix any loose handrails. Make sure handrails on both sides of the stairways are as long as the stairs.  Check carpeting to make sure it is firmly attached along stairs. Make repairs to worn or loose carpet promptly.  Avoid placing throw rugs at the top or bottom of stairways, or properly secure the rug with carpet tape to prevent slippage. Get rid of throw rugs, if possible.  Have an electrician put in a light switch at the top and bottom of the stairs. OTHER FALL PREVENTION TIPS  Wear low-heel or rubber-soled shoes that are supportive and fit well. Wear closed toe shoes.  When using a stepladder, make sure it is fully opened and both spreaders are firmly locked. Do not climb a closed stepladder.  Add color or contrast paint or tape to grab bars and handrails in your home. Place contrasting color strips on first and last steps.  Learn and use mobility aids as needed. Install an electrical emergency response system.  Turn on lights to avoid  dark areas. Replace light bulbs that burn out immediately. Get light switches that glow.  Arrange furniture to create clear pathways. Keep furniture in the same place.  Firmly attach carpet with non-skid or double-sided tape.  Eliminate uneven floor surfaces.  Select a carpet pattern that does not visually hide the edge of steps.  Be aware of all pets. OTHER HOME SAFETY TIPS  Set the water temperature for 120 F (48.8 C).  Keep emergency numbers on or near the telephone.  Keep smoke detectors on every level of the home and near sleeping  areas. Document Released: 12/25/2001 Document Revised: 07/06/2011 Document Reviewed: 03/26/2011 [facility] Patient Information 2015 [place], [place]. This information is not intended to replace advice given to you by your health care provider. Make sure you discuss any questions you have with your health care provider.   Preventive Care for Adults Ages 69 and over  Blood pressure check.** / Every 1 to 2 years.  Lipid and cholesterol check.**/ Every 5 years beginning at age 80.  Lung cancer screening. / Every year if you are aged 104-80 years and have a 30-pack-year history of smoking and currently smoke or have quit within the past 15 years. Yearly screening is stopped once you have quit smoking for at least 15 years or develop a health problem that would prevent you from having lung cancer treatment.  Fecal occult blood test (FOBT) of stool. / Every year beginning at age 80 and continuing until age 80. You may not have to do this test if you get a colonoscopy every 10 years.  Flexible sigmoidoscopy** or colonoscopy.** / Every 5 years for a flexible sigmoidoscopy or every 10 years for a colonoscopy beginning at age 80 and continuing until age 80.  Hepatitis C blood test.** / For all people born from [birth year] through 1963 and any individual with known risks for hepatitis C.  Abdominal aortic aneurysm (AAA) screening.** / A one-time screening for ages 101 to 80 years who are current or former smokers.  Skin self-exam. / Monthly.  Influenza vaccine. / Every year.  Tetanus, diphtheria, and acellular pertussis (Tdap/Td) vaccine.** / 1 dose of Td every 10 years.  Varicella vaccine.** / Consult your health care provider.  Zoster vaccine.** / 1 dose for adults aged 80 years or older.  Pneumococcal 13-valent conjugate (PCV13) vaccine.** / Consult your health care provider.  Pneumococcal polysaccharide (PPSV23) vaccine.** / 1 dose for all adults aged 80 years and older.  Meningococcal vaccine.** /  Consult your health care provider.  Hepatitis A vaccine.** / Consult your health care provider.  Hepatitis B vaccine.** / Consult your health care provider.  Haemophilus influenzae type b (Hib) vaccine.** / Consult your health care provider. **Family history and personal history of risk and conditions may change your health care provider's recommendations. Document Released: 03/02/2001 Document Revised: 01/09/2013 Document Reviewed: 06/01/2010 [facility] Patient Information 2015 [place], [place]. This information is not intended to replace advice given to you by your health care provider. Make sure you discuss any questions you have with your health care provider.

## 2015-04-30 ENCOUNTER — Other Ambulatory Visit: Payer: Self-pay

## 2015-04-30 MED ORDER — RANITIDINE HCL 150 MG PO TABS: ORAL_TABLET | ORAL | Status: DC

## 2015-05-11 ENCOUNTER — Encounter: Payer: Self-pay | Admitting: Internal Medicine

## 2015-05-22 DIAGNOSIS — L918 Other hypertrophic disorders of the skin: Secondary | ICD-10-CM | POA: Diagnosis not present

## 2015-05-22 DIAGNOSIS — L820 Inflamed seborrheic keratosis: Secondary | ICD-10-CM | POA: Diagnosis not present

## 2015-05-22 DIAGNOSIS — L304 Erythema intertrigo: Secondary | ICD-10-CM | POA: Diagnosis not present

## 2015-05-22 DIAGNOSIS — L821 Other seborrheic keratosis: Secondary | ICD-10-CM | POA: Diagnosis not present

## 2015-05-22 DIAGNOSIS — D692 Other nonthrombocytopenic purpura: Secondary | ICD-10-CM | POA: Diagnosis not present

## 2015-06-30 ENCOUNTER — Other Ambulatory Visit: Payer: Self-pay | Admitting: Internal Medicine

## 2015-07-01 ENCOUNTER — Telehealth: Payer: Self-pay | Admitting: Internal Medicine

## 2015-07-01 NOTE — Telephone Encounter (Signed)
Pt's daughter Jase Demo) dropped off Disability Parking Placard document in a white envelope with pt's address on it and stamp to have document mailed out to pt. Please fill out document and mail back.

## 2015-07-02 NOTE — Telephone Encounter (Signed)
Form filled out as much as possible and forwarded to Dr. Larose Kells. JG//CMA

## 2015-07-04 ENCOUNTER — Other Ambulatory Visit: Payer: Self-pay | Admitting: Internal Medicine

## 2015-07-04 NOTE — Telephone Encounter (Signed)
OK to refill

## 2015-08-11 ENCOUNTER — Other Ambulatory Visit: Payer: Self-pay | Admitting: Gastroenterology

## 2015-09-08 ENCOUNTER — Other Ambulatory Visit: Payer: Self-pay | Admitting: Internal Medicine

## 2015-09-18 ENCOUNTER — Other Ambulatory Visit: Payer: Self-pay | Admitting: Internal Medicine

## 2015-10-09 DIAGNOSIS — E119 Type 2 diabetes mellitus without complications: Secondary | ICD-10-CM | POA: Diagnosis not present

## 2015-10-09 DIAGNOSIS — H04123 Dry eye syndrome of bilateral lacrimal glands: Secondary | ICD-10-CM | POA: Diagnosis not present

## 2015-10-09 DIAGNOSIS — H26491 Other secondary cataract, right eye: Secondary | ICD-10-CM | POA: Diagnosis not present

## 2015-10-09 DIAGNOSIS — H10413 Chronic giant papillary conjunctivitis, bilateral: Secondary | ICD-10-CM | POA: Diagnosis not present

## 2015-10-09 DIAGNOSIS — Z961 Presence of intraocular lens: Secondary | ICD-10-CM | POA: Diagnosis not present

## 2015-10-09 LAB — HM DIABETES EYE EXAM

## 2015-10-18 ENCOUNTER — Other Ambulatory Visit: Payer: Self-pay | Admitting: Internal Medicine

## 2015-10-24 ENCOUNTER — Ambulatory Visit (INDEPENDENT_AMBULATORY_CARE_PROVIDER_SITE_OTHER): Payer: Medicare Other | Admitting: Internal Medicine

## 2015-10-24 ENCOUNTER — Encounter: Payer: Self-pay | Admitting: Internal Medicine

## 2015-10-24 VITALS — BP 102/52 | HR 68 | Temp 97.6°F | Resp 17 | Ht 66.0 in | Wt 185.8 lb

## 2015-10-24 DIAGNOSIS — J449 Chronic obstructive pulmonary disease, unspecified: Secondary | ICD-10-CM | POA: Diagnosis not present

## 2015-10-24 DIAGNOSIS — Z23 Encounter for immunization: Secondary | ICD-10-CM | POA: Diagnosis not present

## 2015-10-24 DIAGNOSIS — E785 Hyperlipidemia, unspecified: Secondary | ICD-10-CM

## 2015-10-24 DIAGNOSIS — I10 Essential (primary) hypertension: Secondary | ICD-10-CM

## 2015-10-24 DIAGNOSIS — E119 Type 2 diabetes mellitus without complications: Secondary | ICD-10-CM | POA: Diagnosis not present

## 2015-10-24 LAB — BASIC METABOLIC PANEL
BUN: 39 mg/dL — ABNORMAL HIGH (ref 6–23)
CALCIUM: 9.3 mg/dL (ref 8.4–10.5)
CO2: 30 mEq/L (ref 19–32)
CREATININE: 1.35 mg/dL — AB (ref 0.40–1.20)
Chloride: 101 mEq/L (ref 96–112)
GFR: 39.36 mL/min — AB (ref >60.00)
Glucose, Bld: 108 mg/dL — ABNORMAL HIGH (ref 70–99)
Potassium: 4.3 mEq/L (ref 3.5–5.1)
Sodium: 137 mEq/L (ref 135–145)

## 2015-10-24 LAB — LIPID PANEL
CHOLESTEROL: 105 mg/dL (ref 0–200)
HDL: 29.5 mg/dL — ABNORMAL LOW (ref >39.00)
LDL Cholesterol: 52 mg/dL (ref 0–99)
NONHDL: 75.44
Total CHOL/HDL Ratio: 4
Triglycerides: 117 mg/dL (ref 0.0–149.0)
VLDL: 23.4 mg/dL (ref 0.0–40.0)

## 2015-10-24 LAB — HEMOGLOBIN A1C: HEMOGLOBIN A1C: 6.2 % (ref 4.6–6.5)

## 2015-10-24 LAB — ALT: ALT: 18 U/L (ref 0–35)

## 2015-10-24 LAB — AST: AST: 29 U/L (ref 0–37)

## 2015-10-24 NOTE — Progress Notes (Signed)
Pre visit review using our clinic review tool, if applicable. No additional management support is needed unless otherwise documented below in the visit note. 

## 2015-10-24 NOTE — Assessment & Plan Note (Signed)
DM: Off metformin due to a creatinine increased, has improved her diet, check a A1c.  Continue Januvia HTN: Continue amlodipine, Hyzaar, Toprol. Check a BMP.  Hyperlipidemia: Continue Lipitor, check a FLP, AST, ALT COPD asthma: Mild cough for few days, exam is benign, recommend inhalers, Mucinex and call if sx increase  Also has a number of skin lesions, likely SK, recommend to see dermatology, states she will call primary care: Flu shot today RTC 6 months, CPX. Sooner if needed.

## 2015-10-24 NOTE — Patient Instructions (Signed)
GO TO THE LAB : Get the blood work     GO TO THE FRONT DESK Schedule your next appointment for a  complete physical exam in 6 months. Please come back sooner if needed   Continue using your inhalers, Mucinex DM as needed for cough. If you are not improving or if you have worse, please call the office

## 2015-10-24 NOTE — Progress Notes (Signed)
Subjective:    Patient ID: Rebecca Elliott, female    DOB: [birth year], 82 y.o.   MRN: [id number]  DOS:  10/24/2015 Type of visit - description : Routine visit Interval history: In general feeling great. Is  walking more frequently and has changed her diet. Loosing weight. Meds reviewed: Taking them according to the instructions Has noted some increase in cough the last few days but no fever, chills, no increase in his sputum production.  Wt Readings from Last 3 Encounters:  10/24/15 195 lb 12.8 oz (88.8 kg)  04/24/15 201 lb 6 oz (91.3 kg)  02/18/15 198 lb 3.2 oz (89.9 kg)     Review of Systems   Past Medical History:  Diagnosis Date  . Allergic rhinitis   . COPD  and ASTHMA   . Depression   . Diabetes mellitus    as an adult  . DJD (degenerative joint disease)   . GERD (gastroesophageal reflux disease)    and HH w/ reflux  . History of colonic polyps   . Hyperlipidemia   . Hypertension     Past Surgical History:  Procedure Laterality Date  . ABDOMINAL HYSTERECTOMY     apparently no oophorectomy  . APPENDECTOMY    . bilateral breast tumor    . CATARACT EXTRACTION, BILATERAL    . CHOLECYSTECTOMY    . TONSILLECTOMY    . TUBAL LIGATION      Social History   Social History  . Marital status: Widowed    Spouse name: N/A  . Number of children: 5  . Years of education: N/A   Occupational History  . retired     Social History Main Topics  . Smoking status: Former Smoker    Packs/day: 1.00    Years: 35.00    Types: Cigarettes    Quit date: 01/18/1993  . Smokeless tobacco: Never Used  . Alcohol use No  . Drug use: No  . Sexual activity: Not on file   Other Topics Concern  . Not on file   Social History Narrative   widow (1994), children 5 (lost 3 sons,2 alive, daughters)   2 daughter lives w/ her, both single     [name] 82 y/o   GGchildren x 2         Medication List       Accurate as of 10/24/15  9:57 AM. Always use your most recent med list.          albuterol 108 (90 Base) MCG/ACT inhaler Commonly known as:  VENTOLIN HFA Inhale 2 puffs into the lungs every 6 (six) hours as needed for wheezing or shortness of breath.   amLODipine 10 MG tablet Commonly known as:  NORVASC Take 1 tablet (10 mg total) by mouth daily.   aspirin 81 MG tablet Take 81 mg by mouth daily.   atorvastatin 10 MG tablet Commonly known as:  LIPITOR Take 1 tablet (10 mg total) by mouth daily.   BENEFIBER DRINK MIX PO Take by mouth 2 (two) times daily.   CENTRUM SILVER Chew Chew by mouth.   dextromethorphan-guaiFENesin 30-600 MG 12hr tablet Commonly known as:  MUCINEX DM Take 1 tablet by mouth daily.   docusate sodium 100 MG capsule Commonly known as:  COLACE Take 1-2 by mouth once a day.   loratadine 10 MG tablet Commonly known as:  CLARITIN Take 10 mg by mouth daily.   losartan-hydrochlorothiazide 100-12.5 MG tablet Commonly known as:  HYZAAR Take 1 tablet by mouth  daily.   metoprolol succinate 25 MG 24 hr tablet Commonly known as:  TOPROL-XL Take 1.5 tablets (37.5 mg total) by mouth daily.   pantoprazole 40 MG tablet Commonly known as:  PROTONIX Take 20 mg by mouth daily.   ranitidine 150 MG tablet Commonly known as:  ZANTAC TAKE 1 TABLET AT BEDTIME   sitaGLIPtin 100 MG tablet Commonly known as:  JANUVIA Take 1 tablet (100 mg total) by mouth daily.   SPIRIVA HANDIHALER 18 MCG inhalation capsule Generic drug:  tiotropium PLACE 1 CAPSULE INTO       INHALER AND INHALE         CONTENTS OF 1 CAPSULE      DAILY.          Objective:   Physical Exam BP (!) 102/52 (BP Location: Right Arm, Patient Position: Sitting, Cuff Size: Large)   Pulse 68   Temp 97.6 F (36.4 C) (Oral)   Resp 17   Ht 5\' 6"  (1.676 m)   Wt 195 lb 12.8 oz (88.8 kg)   SpO2 94%   BMI 31.60 kg/m  General:   Well developed, well nourished . NAD.  HEENT:  Normocephalic . Face symmetric, atraumatic Lungs:  CTA B Normal respiratory effort, no  intercostal retractions, no accessory muscle use. Heart: RRR,  no murmur.  no pretibial edema bilaterally  Abdomen:  Not distended, soft, non-tender. No rebound or rigidity.  DIABETIC FEET EXAM: No lower extremity edema Normal pedal pulses bilaterally Skin normal, nails normal, few calluses Pinprick examination of the feet normal. Neurologic:  alert & oriented X3.  Speech normal, gait appropriate for age and unassisted Psych--  Cognition and judgment appear intact.  Cooperative with normal attention span and concentration.  Behavior appropriate. No anxious or depressed appearing.    Assessment & Plan:   Assessment > DM--no neuropathy, Metformin DC 04-2015 d/t creatinine HTN Hyperlipidemia Depression COPD - Asthma Pulmonary nodules per CXR 03-2014, CT 04/25/2014 confirmatory,saw pulmonary >> re do CT 1 year Anemia, chronic, on-off iron def  Colonoscopy 2004: No polyps EGD 2006 for dysphagia showed hiatal hernia, occult  stricture? Colonoscopy 2010 showed diverticuli Dysphagia: Barium swallow showed dysmotility 08-2014 DJD GERD  PLAN:: DM: Off metformin due to a creatinine increased, has improved her diet, check a A1c.  Continue Januvia HTN: Continue amlodipine, Hyzaar, Toprol. Check a BMP.  Hyperlipidemia: Continue Lipitor, check a FLP, AST, ALT COPD asthma: Mild cough for few days, exam is benign, recommend inhalers, Mucinex and call if sx increase  Also has a number of skin lesions, likely SK, recommend to see dermatology, states she will call primary care: Flu shot today RTC 6 months, CPX. Sooner if needed.

## 2015-11-03 ENCOUNTER — Encounter: Payer: Self-pay | Admitting: Internal Medicine

## 2015-11-07 ENCOUNTER — Other Ambulatory Visit: Payer: Self-pay | Admitting: Gastroenterology

## 2015-12-27 ENCOUNTER — Other Ambulatory Visit: Payer: Self-pay | Admitting: Internal Medicine

## 2016-02-05 ENCOUNTER — Other Ambulatory Visit: Payer: Self-pay | Admitting: Gastroenterology

## 2016-02-25 ENCOUNTER — Ambulatory Visit (INDEPENDENT_AMBULATORY_CARE_PROVIDER_SITE_OTHER): Payer: Medicare Other | Admitting: Adult Health

## 2016-02-25 ENCOUNTER — Encounter: Payer: Self-pay | Admitting: Adult Health

## 2016-02-25 VITALS — BP 122/66 | HR 58 | Ht 67.0 in | Wt 188.6 lb

## 2016-02-25 DIAGNOSIS — J449 Chronic obstructive pulmonary disease, unspecified: Secondary | ICD-10-CM

## 2016-02-25 DIAGNOSIS — R918 Other nonspecific abnormal finding of lung field: Secondary | ICD-10-CM

## 2016-02-25 NOTE — Progress Notes (Signed)
@Patient  ID: Rebecca Elliott, female    DOB: 25-Feb-1926, 83 y.o.   MRN: [id number]  Chief Complaint  Patient presents with  . Follow-up    COPD /Lung nodules     Referring provider: Colon Branch, MD  HPI: 83 year old female with remote smoking history. Follow for multiple scattered pulmonary nodules first noted in April 2016 after an acute illness and COPD  TEST   Significant tests/ events CT chest 04/2014 showed multiple small nodules with the largest being about 8 mm in the right lower lobe. These nodules were new when compared to a CT from 02/2003.   07/2014 CT showed multiple small bilateral pulmonary nodules that are essentially stable with the largest measuring 7 mm in the right lung base. CT abdomen 07/2014 nonspecific periportal lymphadenopathy with several nodes, increased in size since 2005.  Colonoscopy - 2010 showed severe diverticulosis and internal hemorrhoids.  02/25/2016 follow-up : COPD and Lung nodules  Pt returns for 1 year follow up for COPD . She is on Spiriva . Says breathing is doing well w/ no flare of cough or dyspnea. No increased SABA use.  Says when she walks long ways gets winded some but rests and goes away.  Has occasional cough but not bad. No fever or discolored mucus.   She has known scattered lung nodules. Several new nodules were noted in 04/2014 and serial CT chest in 01/2015 showed stable nodules. She is due for a CT chest . No weight loss or hemoptysis .   No Known Allergies  Immunization History  Administered Date(s) Administered  . Influenza Split 11/15/2011  . Influenza Whole 12/12/2006, 11/03/2007, 11/07/2008, 11/07/2009  . Influenza, High Dose Seasonal PF 11/09/2012, 11/19/2014, 10/24/2015  . Influenza,inj,Quad PF,36+ Mos 09/26/2013  . Pneumococcal Conjugate-13 01/30/2014  . Pneumococcal Polysaccharide-23 02/19/2003, 03/08/2008  . Td 02/19/2000  . Tdap 07/10/2010  . Zoster 07/08/2009    Past Medical History:  Diagnosis Date  .  Allergic rhinitis   . COPD  and ASTHMA   . Depression   . Diabetes mellitus    as an adult  . DJD (degenerative joint disease)   . GERD (gastroesophageal reflux disease)    and HH w/ reflux  . History of colonic polyps   . Hyperlipidemia   . Hypertension     Tobacco History: History  Smoking Status  . Former Smoker  . Packs/day: 1.00  . Years: 35.00  . Types: Cigarettes  . Quit date: 01/18/1993  Smokeless Tobacco  . Never Used   Counseling given: Not Answered   Outpatient Encounter Prescriptions as of 02/25/2016  Medication Sig  . albuterol (VENTOLIN HFA) 108 (90 BASE) MCG/ACT inhaler Inhale 2 puffs into the lungs every 6 (six) hours as needed for wheezing or shortness of breath.  Marland Kitchen amLODipine (NORVASC) 10 MG tablet Take 1 tablet (10 mg total) by mouth daily.  Marland Kitchen aspirin 81 MG tablet Take 81 mg by mouth daily.   Marland Kitchen atorvastatin (LIPITOR) 10 MG tablet Take 1 tablet (10 mg total) by mouth daily.  Marland Kitchen dextromethorphan-guaiFENesin (MUCINEX DM) 30-600 MG per 12 hr tablet Take 1 tablet by mouth daily.    Marland Kitchen docusate sodium (COLACE) 100 MG capsule Take 1-2 by mouth once a day.   . loratadine (CLARITIN) 10 MG tablet Take 10 mg by mouth daily.    Marland Kitchen losartan-hydrochlorothiazide (HYZAAR) 100-12.5 MG tablet Take 1 tablet by mouth daily.  . metoprolol succinate (TOPROL-XL) 25 MG 24 hr tablet Take 1.5 tablets (37.5 mg  total) by mouth daily.  . Multiple Vitamins-Minerals (CENTRUM SILVER) CHEW Chew by mouth.    . pantoprazole (PROTONIX) 40 MG tablet Take 40 mg by mouth every other day.   . ranitidine (ZANTAC) 150 MG tablet TAKE 1 TABLET AT BEDTIME  . sitaGLIPtin (JANUVIA) 100 MG tablet Take 1 tablet (100 mg total) by mouth daily.  Marland Kitchen SPIRIVA HANDIHALER 18 MCG inhalation capsule PLACE 1 CAPSULE INTO       INHALER AND INHALE         CONTENTS OF 1 CAPSULE      DAILY.  Marland Kitchen Wheat Dextrin (BENEFIBER DRINK MIX PO) Take by mouth 2 (two) times daily.     No facility-administered encounter medications on file  as of 02/25/2016.      Review of Systems  Constitutional:   No  weight loss, night sweats,  Fevers, chills, fatigue, or  lassitude.  HEENT:   No headaches,  Difficulty swallowing,  Tooth/dental problems, or  Sore throat,                No sneezing, itching, ear ache, nasal congestion, post nasal drip,   CV:  No chest pain,  Orthopnea, PND, swelling in lower extremities, anasarca, dizziness, palpitations, syncope.   GI  No heartburn, indigestion, abdominal pain, nausea, vomiting, diarrhea, change in bowel habits, loss of appetite, bloody stools.   Resp:  No wheezing.  No chest wall deformity  Skin: no rash or lesions.  GU: no dysuria, change in color of urine, no urgency or frequency.  No flank pain, no hematuria   MS:  No joint pain or swelling.  No decreased range of motion.  No back pain.    Physical Exam  BP 122/66 (BP Location: Left Arm, Cuff Size: Normal)   Pulse (!) 58   Ht 5\' 7"  (1.702 m)   Wt 188 lb 9.6 oz (85.5 kg)   SpO2 94%   BMI 29.54 kg/m   GEN: A/Ox3; pleasant ,elderly    HEENT:  [place]/AT,  EACs-clear, TMs-wnl, NOSE-clear, THROAT-clear, no lesions, no postnasal drip or exudate noted.   NECK:  Supple w/ fair ROM; no JVD; normal carotid impulses w/o bruits; no thyromegaly or nodules palpated; no lymphadenopathy.    RESP  Decreased BS in bases  ,  no accessory muscle use, no dullness to percussion  CARD:  RRR, no m/r/g, no peripheral edema, pulses intact, no cyanosis or clubbing.  GI:   Soft & nt; nml bowel sounds; no organomegaly or masses detected.   Musco: Warm bil, no deformities or joint swelling noted.   Neuro: alert, no focal deficits noted.    Skin: Warm, no lesions or rashes    Lab Results:  CBCDictation #1 [id number]  [id number]   BNP No results found for: BNP  ProBNP No results found for: PROBNP  Imaging: No results found.   Assessment & Plan:   COPD and Asthma Well controlled on Spiriva   Plan  No changes    Pulmonary nodules Stable lung nodules since 2016, and serial CT chest 2017 .  Repeat CT chest next month .-if stable most likely benign       Rexene Edison, NP 02/25/2016

## 2016-02-25 NOTE — Assessment & Plan Note (Signed)
Stable lung nodules since 2016, and serial CT chest 2017 .  Repeat CT chest next month .-if stable most likely benign

## 2016-02-25 NOTE — Patient Instructions (Signed)
Continue on current regimen .  We will set up CT chest to follow lung nodules in 6 weeks .  Follow up with Dr. Elsworth Soho  In 1 year and As needed

## 2016-02-25 NOTE — Assessment & Plan Note (Signed)
Well controlled on Spiriva   Plan  No changes

## 2016-02-26 NOTE — Progress Notes (Signed)
Reviewed & agree with plan  

## 2016-03-08 ENCOUNTER — Other Ambulatory Visit: Payer: Self-pay | Admitting: Internal Medicine

## 2016-03-08 DIAGNOSIS — Z1231 Encounter for screening mammogram for malignant neoplasm of breast: Secondary | ICD-10-CM

## 2016-03-11 DIAGNOSIS — D1801 Hemangioma of skin and subcutaneous tissue: Secondary | ICD-10-CM | POA: Diagnosis not present

## 2016-03-11 DIAGNOSIS — L814 Other melanin hyperpigmentation: Secondary | ICD-10-CM | POA: Diagnosis not present

## 2016-03-11 DIAGNOSIS — L820 Inflamed seborrheic keratosis: Secondary | ICD-10-CM | POA: Diagnosis not present

## 2016-03-11 DIAGNOSIS — L821 Other seborrheic keratosis: Secondary | ICD-10-CM | POA: Diagnosis not present

## 2016-03-11 DIAGNOSIS — D225 Melanocytic nevi of trunk: Secondary | ICD-10-CM | POA: Diagnosis not present

## 2016-03-11 DIAGNOSIS — D692 Other nonthrombocytopenic purpura: Secondary | ICD-10-CM | POA: Diagnosis not present

## 2016-03-15 ENCOUNTER — Other Ambulatory Visit: Payer: Self-pay

## 2016-03-15 MED ORDER — PANTOPRAZOLE SODIUM 40 MG PO TBEC: 40.0000 mg | DELAYED_RELEASE_TABLET | ORAL | 1 refills | Status: DC

## 2016-04-06 ENCOUNTER — Ambulatory Visit (HOSPITAL_BASED_OUTPATIENT_CLINIC_OR_DEPARTMENT_OTHER)
Admission: RE | Admit: 2016-04-06 | Discharge: 2016-04-06 | Disposition: A | Discharge status: Home / Self Care | Payer: Medicare Other | Source: Ambulatory Visit | Attending: Adult Health | Admitting: Adult Health

## 2016-04-06 ENCOUNTER — Other Ambulatory Visit: Payer: Self-pay | Admitting: Internal Medicine

## 2016-04-06 DIAGNOSIS — R918 Other nonspecific abnormal finding of lung field: Secondary | ICD-10-CM | POA: Diagnosis not present

## 2016-04-06 DIAGNOSIS — Z1231 Encounter for screening mammogram for malignant neoplasm of breast: Secondary | ICD-10-CM

## 2016-04-06 DIAGNOSIS — I700 Atherosclerosis of aorta: Secondary | ICD-10-CM | POA: Insufficient documentation

## 2016-04-06 DIAGNOSIS — I2510 Atherosclerotic heart disease of native coronary artery without angina pectoris: Secondary | ICD-10-CM | POA: Insufficient documentation

## 2016-04-08 ENCOUNTER — Ambulatory Visit: Payer: Medicare Other

## 2016-04-09 NOTE — Progress Notes (Signed)
Called spoke to pt. Informed her of the results and recs per TP. Pt verbalized understanding and denied any further questions or concerns at this time.

## 2016-04-21 ENCOUNTER — Other Ambulatory Visit: Payer: Self-pay | Admitting: Internal Medicine

## 2016-04-22 ENCOUNTER — Encounter: Payer: Self-pay | Admitting: Internal Medicine

## 2016-04-22 ENCOUNTER — Ambulatory Visit (HOSPITAL_BASED_OUTPATIENT_CLINIC_OR_DEPARTMENT_OTHER)
Admission: RE | Admit: 2016-04-22 | Discharge: 2016-04-22 | Disposition: A | Discharge status: Home / Self Care | Payer: Medicare Other | Source: Ambulatory Visit | Attending: Internal Medicine | Admitting: Internal Medicine

## 2016-04-22 ENCOUNTER — Ambulatory Visit (HOSPITAL_BASED_OUTPATIENT_CLINIC_OR_DEPARTMENT_OTHER): Payer: Medicare Other

## 2016-04-22 ENCOUNTER — Ambulatory Visit (INDEPENDENT_AMBULATORY_CARE_PROVIDER_SITE_OTHER): Payer: Medicare Other | Admitting: Internal Medicine

## 2016-04-22 VITALS — BP 128/78 | HR 59 | Temp 97.7°F | Resp 14 | Ht 67.0 in | Wt 189.0 lb

## 2016-04-22 DIAGNOSIS — I10 Essential (primary) hypertension: Secondary | ICD-10-CM | POA: Diagnosis not present

## 2016-04-22 DIAGNOSIS — E119 Type 2 diabetes mellitus without complications: Secondary | ICD-10-CM

## 2016-04-22 DIAGNOSIS — Z1231 Encounter for screening mammogram for malignant neoplasm of breast: Secondary | ICD-10-CM | POA: Diagnosis not present

## 2016-04-22 LAB — BASIC METABOLIC PANEL
BUN: 22 mg/dL (ref 6–23)
CALCIUM: 9.4 mg/dL (ref 8.4–10.5)
CO2: 31 mEq/L (ref 19–32)
CREATININE: 1.18 mg/dL (ref 0.40–1.20)
Chloride: 102 mEq/L (ref 96–112)
GFR: 45.92 mL/min — ABNORMAL LOW (ref >60.00)
Glucose, Bld: 105 mg/dL — ABNORMAL HIGH (ref 70–99)
POTASSIUM: 3.9 meq/L (ref 3.5–5.1)
SODIUM: 138 meq/L (ref 135–145)

## 2016-04-22 LAB — CBC WITH DIFFERENTIAL/PLATELET
BASOS ABS: 0.1 10*3/uL (ref 0.0–0.1)
BASOS PCT: 0.9 % (ref 0.0–3.0)
EOS ABS: 0.2 10*3/uL (ref 0.0–0.7)
Eosinophils Relative: 1.6 % (ref 0.0–5.0)
HCT: 40.9 % (ref 36.0–46.0)
Hemoglobin: 13.3 g/dL (ref 12.0–15.0)
LYMPHS ABS: 3.6 10*3/uL (ref 0.7–4.0)
Lymphocytes Relative: 35.8 % (ref 12.0–46.0)
MCHC: 32.5 g/dL (ref 30.0–36.0)
MCV: 83.2 fl (ref 78.0–100.0)
MONO ABS: 0.9 10*3/uL (ref 0.1–1.0)
Monocytes Relative: 8.8 % (ref 3.0–12.0)
NEUTROS ABS: 5.3 10*3/uL (ref 1.4–7.7)
NEUTROS PCT: 52.9 % (ref 43.0–77.0)
Platelets: 249 10*3/uL (ref 150.0–400.0)
RBC: 4.91 Mil/uL (ref 3.87–5.11)
RDW: 14.4 % (ref 11.5–15.5)
WBC: 10.1 10*3/uL (ref 4.0–10.5)

## 2016-04-22 LAB — HEMOGLOBIN A1C: HEMOGLOBIN A1C: 6.4 % (ref 4.6–6.5)

## 2016-04-22 MED ORDER — SITAGLIPTIN PHOSPHATE 50 MG PO TABS: 50.0000 mg | ORAL_TABLET | Freq: Every day | ORAL | 1 refills | Status: DC

## 2016-04-22 NOTE — Patient Instructions (Signed)
GO TO THE LAB : Get the blood work     GO TO THE FRONT DESK Schedule your next appointment for a  checkup in 4-5 months   

## 2016-04-22 NOTE — Progress Notes (Signed)
Subjective:    Patient ID: Rebecca Elliott, female    DOB: Jan 25, 1926, 83 y.o.   MRN: [id number]  DOS:  04/22/2016 Type of visit - description :  Routine checkup Interval history:  Good compliance of medications. No ambulatory CBGs or ambulatory BPs. Diet is healthy Has been unable to exercise much due to the weather but plans to go back on a more active lifestyle COPD: Continue with DOE, still able to do her ADLs  Review of Systems  no chest pain No hemoptysis or abundant sputum production  Past Medical History:  Diagnosis Date  . Allergic rhinitis   . COPD  and ASTHMA   . Depression   . Diabetes mellitus    as an adult  . DJD (degenerative joint disease)   . GERD (gastroesophageal reflux disease)    and HH w/ reflux  . History of colonic polyps   . Hyperlipidemia   . Hypertension     Past Surgical History:  Procedure Laterality Date  . ABDOMINAL HYSTERECTOMY     apparently no oophorectomy  . APPENDECTOMY    . bilateral breast tumor    . CATARACT EXTRACTION, BILATERAL    . CHOLECYSTECTOMY    . TONSILLECTOMY    . TUBAL LIGATION      Social History   Social History  . Marital status: Widowed    Spouse name: N/A  . Number of children: 5  . Years of education: N/A   Occupational History  . retired     Social History Main Topics  . Smoking status: Former Smoker    Packs/day: 1.00    Years: 35.00    Types: Cigarettes    Quit date: 01/18/1993  . Smokeless tobacco: Never Used  . Alcohol use No  . Drug use: No  . Sexual activity: Not on file   Other Topics Concern  . Not on file   Social History Narrative   widow (1994), children 5 (lost 3 sons,2 alive, daughters)   2 daughter lives w/ her, both single     [name] 81 y/o   GGchildren x 2       Allergies as of 04/22/2016   No Known Allergies     Medication List       Accurate as of 04/22/16 11:59 PM. Always use your most recent med list.          albuterol 108 (90 Base) MCG/ACT inhaler Commonly  known as:  VENTOLIN HFA Inhale 2 puffs into the lungs every 6 (six) hours as needed for wheezing or shortness of breath.   amLODipine 10 MG tablet Commonly known as:  NORVASC Take 1 tablet (10 mg total) by mouth daily.   aspirin 81 MG tablet Take 81 mg by mouth daily.   atorvastatin 10 MG tablet Commonly known as:  LIPITOR Take 1 tablet (10 mg total) by mouth daily.   BENEFIBER DRINK MIX PO Take by mouth 2 (two) times daily.   CENTRUM SILVER Chew Chew by mouth.   dextromethorphan-guaiFENesin 30-600 MG 12hr tablet Commonly known as:  MUCINEX DM Take 1 tablet by mouth daily.   docusate sodium 100 MG capsule Commonly known as:  COLACE Take 1-2 by mouth once a day.   loratadine 10 MG tablet Commonly known as:  CLARITIN Take 10 mg by mouth daily.   losartan-hydrochlorothiazide 100-12.5 MG tablet Commonly known as:  HYZAAR Take 1 tablet by mouth daily.   metoprolol succinate 25 MG 24 hr tablet Commonly known as:  TOPROL-XL Take 1.5 tablets (37.5 mg total) by mouth daily.   pantoprazole 40 MG tablet Commonly known as:  PROTONIX Take 1 tablet (40 mg total) by mouth every other day.   ranitidine 150 MG tablet Commonly known as:  ZANTAC TAKE 1 TABLET AT BEDTIME   sitaGLIPtin 50 MG tablet Commonly known as:  JANUVIA Take 1 tablet (50 mg total) by mouth daily.   SPIRIVA HANDIHALER 18 MCG inhalation capsule Generic drug:  tiotropium PLACE 1 CAPSULE INTO       INHALER AND INHALE         CONTENTS OF 1 CAPSULE      DAILY.          Objective:   Physical Exam BP 128/78 (BP Location: Left Arm, Patient Position: Sitting, Cuff Size: Normal)   Pulse (!) 59   Temp 97.7 F (36.5 C) (Oral)   Resp 14   Ht 5\' 7"  (1.702 m)   Wt 189 lb (85.7 kg)   SpO2 93%   BMI 29.60 kg/m  General:   Well developed, well nourished . NAD.  HEENT:  Normocephalic . Face symmetric, atraumatic Lungs:  decrease breath sounds otherwise clear Normal respiratory effort, no intercostal  retractions, no accessory muscle use. Heart: RRR,  no murmur.  Mild right peri-ankle edema, not new per patient. Skin: Not pale. Not jaundice Neurologic:  alert & oriented X3.  Speech normal, gait appropriate for age and unassisted Psych--  Cognition and judgment appear intact.  Cooperative with normal attention span and concentration.  Behavior appropriate. No anxious or depressed appearing.      Assessment & Plan:  Assessment > DM--no neuropathy, Metformin DC 04-2015 d/t creatinine HTN Hyperlipidemia Depression COPD - Asthma Pulmonary nodules per CXR 03-2014, CT 04/25/2014 confirmatory,saw pulmonary >> re do CT 1 year Anemia, chronic, on-off iron def  Colonoscopy 2004: No polyps EGD 2006 for dysphagia showed hiatal hernia, occult  stricture? Colonoscopy 2010 showed diverticuli Dysphagia: Barium swallow showed dysmotility 08-2014 DJD GERD  PLAN: DM: Due to slightly increased creatinine, Januvia was decreased to 50 mg. Check A1c HTN: Last creatinine slightly up, continue amlodipine, Hyzaar, Toprol. Check a BMP and CBC. Slightly elevated WBC: Recheck in a CBC today RTC 4-5 months

## 2016-04-22 NOTE — Progress Notes (Signed)
Pre visit review using our clinic review tool, if applicable. No additional management support is needed unless otherwise documented below in the visit note. 

## 2016-04-23 ENCOUNTER — Other Ambulatory Visit: Payer: Self-pay

## 2016-04-23 MED ORDER — TIOTROPIUM BROMIDE MONOHYDRATE 18 MCG IN CAPS: 18.0000 ug | ORAL_CAPSULE | Freq: Every day | RESPIRATORY_TRACT | 0 refills | Status: DC

## 2016-04-23 NOTE — Assessment & Plan Note (Signed)
DM: Due to slightly increased creatinine, Januvia was decreased to 50 mg. Check A1c HTN: Last creatinine slightly up, continue amlodipine, Hyzaar, Toprol. Check a BMP and CBC. Slightly elevated WBC: Recheck in a CBC today RTC 4-5 months

## 2016-04-28 ENCOUNTER — Ambulatory Visit: Payer: Medicare Other | Admitting: Internal Medicine

## 2016-05-04 ENCOUNTER — Other Ambulatory Visit: Payer: Self-pay | Admitting: Internal Medicine

## 2016-05-05 ENCOUNTER — Other Ambulatory Visit: Payer: Self-pay | Admitting: Gastroenterology

## 2016-05-24 ENCOUNTER — Telehealth: Payer: Self-pay | Admitting: Internal Medicine

## 2016-05-24 DIAGNOSIS — M25561 Pain in right knee: Secondary | ICD-10-CM

## 2016-05-24 NOTE — Telephone Encounter (Signed)
Caller name: [name] Relation to ID:[id number]  Call back number: [phone]    Reason for call:  Daughter requesting referral for pain right knee

## 2016-05-25 NOTE — Telephone Encounter (Signed)
Referral placed.

## 2016-05-27 ENCOUNTER — Ambulatory Visit (INDEPENDENT_AMBULATORY_CARE_PROVIDER_SITE_OTHER): Payer: Medicare Other | Admitting: Orthopaedic Surgery

## 2016-05-27 ENCOUNTER — Ambulatory Visit (INDEPENDENT_AMBULATORY_CARE_PROVIDER_SITE_OTHER): Payer: Medicare Other

## 2016-05-27 ENCOUNTER — Encounter (INDEPENDENT_AMBULATORY_CARE_PROVIDER_SITE_OTHER): Payer: Self-pay | Admitting: Orthopaedic Surgery

## 2016-05-27 DIAGNOSIS — M1711 Unilateral primary osteoarthritis, right knee: Secondary | ICD-10-CM

## 2016-05-27 DIAGNOSIS — M1712 Unilateral primary osteoarthritis, left knee: Secondary | ICD-10-CM | POA: Diagnosis not present

## 2016-05-27 DIAGNOSIS — M25561 Pain in right knee: Secondary | ICD-10-CM | POA: Diagnosis not present

## 2016-05-27 DIAGNOSIS — G8929 Other chronic pain: Secondary | ICD-10-CM

## 2016-05-27 DIAGNOSIS — M25562 Pain in left knee: Secondary | ICD-10-CM | POA: Diagnosis not present

## 2016-05-27 MED ORDER — BUPIVACAINE HCL 0.5 % IJ SOLN
2.0000 mL | INTRAMUSCULAR | Status: AC | PRN
  Administered 2016-05-27: 2 mL via INTRA_ARTICULAR

## 2016-05-27 MED ORDER — LIDOCAINE HCL 1 % IJ SOLN
2.0000 mL | INTRAMUSCULAR | Status: AC | PRN
  Administered 2016-05-27: 2 mL

## 2016-05-27 MED ORDER — METHYLPREDNISOLONE ACETATE 40 MG/ML IJ SUSP
40.0000 mg | INTRAMUSCULAR | Status: AC | PRN
  Administered 2016-05-27: 40 mg via INTRA_ARTICULAR

## 2016-05-27 NOTE — Progress Notes (Signed)
Office Visit Note   Patient: Rebecca Elliott           Date of Birth: 12-Jun-1926           MRN: [id number] Visit Date: 05/27/2016              Requested by: Colon Branch, MD [id number] [place], [place] [address] PCP: Colon Branch, MD   Assessment & Plan: Visit Diagnoses:  1. Primary osteoarthritis of right knee   2. Unilateral primary osteoarthritis, left knee     Plan: Bilateral knee injections were performed today. Patient tolerated this well. Follow-up as needed.  Follow-Up Instructions: Return if symptoms worsen or fail to improve.   Orders:  Orders Placed This Encounter  Procedures  . Large Joint Injection/Arthrocentesis  . Large Joint Injection/Arthrocentesis  . XR KNEE 3 VIEW LEFT  . XR KNEE 3 VIEW RIGHT   No orders of the defined types were placed in this encounter.     Procedures: Large Joint Inj Date/Time: 05/27/2016 10:36 AM Performed by: Leandrew Koyanagi Authorized by: Leandrew Koyanagi   Consent Given by:  Patient Timeout: prior to procedure the correct patient, procedure, and site was verified   Indications:  Pain Location:  Knee Site:  R knee Prep: patient was prepped and draped in usual sterile fashion   Needle Size:  22 G Ultrasound Guidance: No   Fluoroscopic Guidance: No   Arthrogram: No   Patient tolerance:  Patient tolerated the procedure well with no immediate complications Large Joint Inj Date/Time: 05/27/2016 11:40 AM Performed by: Leandrew Koyanagi Authorized by: Leandrew Koyanagi   Consent Given by:  Patient Timeout: prior to procedure the correct patient, procedure, and site was verified   Indications:  Pain Location:  Knee Site:  R knee Prep: patient was prepped and draped in usual sterile fashion   Needle Size:  22 G Ultrasound Guidance: No   Fluoroscopic Guidance: No   Arthrogram: No   Medications:  2 mL lidocaine 1 %; 2 mL bupivacaine 0.5 %; 40 mg methylPREDNISolone acetate 40 MG/ML Patient tolerance:  Patient tolerated  the procedure well with no immediate complications     Clinical Data: No additional findings.   Subjective: Chief Complaint  Patient presents with  . Right Knee - Pain  . Left Knee - Pain    Patient is a 83 year old female who comes in with bilateral knee pain has been getting worse progressively. This is worse in the right knee. She does have a diagnosis of degenerative joint disease. She's had an injection years ago which did help. She is requesting another injection. The pain does radiate down her leg. She denies any back pain. Denies any constitutional symptoms.    Review of Systems  Constitutional: Negative.   HENT: Negative.   Eyes: Negative.   Respiratory: Negative.   Cardiovascular: Negative.   Endocrine: Negative.   Musculoskeletal: Negative.   Neurological: Negative.   Hematological: Negative.   Psychiatric/Behavioral: Negative.   All other systems reviewed and are negative.    Objective: Vital Signs: There were no vitals taken for this visit.  Physical Exam  Constitutional: She is oriented to person, place, and time. She appears well-developed and well-nourished.  HENT:  Head: Normocephalic and atraumatic.  Eyes: EOM are normal.  Neck: Neck supple.  Pulmonary/Chest: Effort normal.  Abdominal: Soft.  Neurological: She is alert and oriented to person, place, and time.  Skin: Skin is warm. Capillary  refill takes less than 2 seconds.  Psychiatric: She has a normal mood and affect. Her behavior is normal. Judgment and thought content normal.  Nursing note and vitals reviewed.   Ortho Exam Bilateral knee exam shows valgus deformity of the right knee. Positive crepitus. She has essentially normal range of motion. Collaterals and cruciates are stable. Specialty Comments:  No specialty comments available.  Imaging: Xr Knee 3 View Left  Result Date: 05/27/2016 Advanced DJD  Xr Knee 3 View Right  Result Date: 05/27/2016 Advanced DJD    PMFS  History: Patient Active Problem List   Diagnosis Date Noted  . PCP NOTES >>>>>>>>>>>>> 11/19/2014  . Abdominal lymphadenopathy 08/09/2014  . Pulmonary nodules 05/01/2014  . Allergic rhinitis 04/18/2014  . Cough 04/18/2014  . Tremor 09/26/2013  . Elevated LFTs 08/13/2011  . Annual physical exam 07/10/2010  . DEGENERATIVE JOINT DISEASE 07/08/2009  . COLONIC POLYPS, ADENOMATOUS, HX OF 08/14/2008  . ALLERGIC RHINITIS 05/02/2007  . DMII (diabetes mellitus, type 2) ([place]) 12/12/2006  . HYPERLIPIDEMIA 10/10/2006  . DEPRESSION 10/10/2006  . GERD 10/10/2006  . HIATAL HERNIA WITH REFLUX 10/10/2006  . Essential hypertension 03/13/2006  . COPD and Asthma 03/13/2006   Past Medical History:  Diagnosis Date  . Allergic rhinitis   . COPD  and ASTHMA   . Depression   . Diabetes mellitus    as an adult  . DJD (degenerative joint disease)   . GERD (gastroesophageal reflux disease)    and HH w/ reflux  . History of colonic polyps   . Hyperlipidemia   . Hypertension     Family History  Problem Relation Age of Onset  . Breast cancer Sister        x 2  . Diabetes Father   . Diabetes Sister   . Heart attack Brother        early 46s  . Heart attack Son   . Pancreatic cancer Son   . Stomach cancer Sister   . Colon cancer Neg Hx     Past Surgical History:  Procedure Laterality Date  . ABDOMINAL HYSTERECTOMY     apparently no oophorectomy  . APPENDECTOMY    . bilateral breast tumor    . CATARACT EXTRACTION, BILATERAL    . CHOLECYSTECTOMY    . TONSILLECTOMY    . TUBAL LIGATION     Social History   Occupational History  . retired     Social History Main Topics  . Smoking status: Former Smoker    Packs/day: 1.00    Years: 35.00    Types: Cigarettes    Quit date: 01/18/1993  . Smokeless tobacco: Never Used  . Alcohol use No  . Drug use: No  . Sexual activity: Not on file

## 2016-06-01 DIAGNOSIS — R233 Spontaneous ecchymoses: Secondary | ICD-10-CM | POA: Diagnosis not present

## 2016-06-01 DIAGNOSIS — R600 Localized edema: Secondary | ICD-10-CM | POA: Diagnosis not present

## 2016-06-03 ENCOUNTER — Ambulatory Visit (HOSPITAL_BASED_OUTPATIENT_CLINIC_OR_DEPARTMENT_OTHER)
Admission: RE | Admit: 2016-06-03 | Discharge: 2016-06-03 | Disposition: A | Discharge status: Home / Self Care | Payer: Medicare Other | Source: Ambulatory Visit | Attending: Internal Medicine | Admitting: Internal Medicine

## 2016-06-03 ENCOUNTER — Ambulatory Visit (INDEPENDENT_AMBULATORY_CARE_PROVIDER_SITE_OTHER): Payer: Medicare Other | Admitting: Internal Medicine

## 2016-06-03 ENCOUNTER — Encounter: Payer: Self-pay | Admitting: Internal Medicine

## 2016-06-03 VITALS — BP 110/58 | HR 58 | Temp 97.6°F | Ht 67.0 in | Wt 190.6 lb

## 2016-06-03 DIAGNOSIS — R21 Rash and other nonspecific skin eruption: Secondary | ICD-10-CM

## 2016-06-03 DIAGNOSIS — M7989 Other specified soft tissue disorders: Secondary | ICD-10-CM | POA: Diagnosis not present

## 2016-06-03 DIAGNOSIS — R600 Localized edema: Secondary | ICD-10-CM | POA: Diagnosis not present

## 2016-06-03 LAB — CBC WITH DIFFERENTIAL/PLATELET
Basophils Absolute: 0.1 10*3/uL (ref 0.0–0.1)
Basophils Relative: 0.9 % (ref 0.0–3.0)
EOS ABS: 0.2 10*3/uL (ref 0.0–0.7)
EOS PCT: 1.3 % (ref 0.0–5.0)
HEMATOCRIT: 41.9 % (ref 36.0–46.0)
HEMOGLOBIN: 13.4 g/dL (ref 12.0–15.0)
LYMPHS PCT: 36 % (ref 12.0–46.0)
Lymphs Abs: 4.4 10*3/uL — ABNORMAL HIGH (ref 0.7–4.0)
MCHC: 31.9 g/dL (ref 30.0–36.0)
MCV: 83.7 fl (ref 78.0–100.0)
MONO ABS: 1 10*3/uL (ref 0.1–1.0)
Monocytes Relative: 8 % (ref 3.0–12.0)
Neutro Abs: 6.6 10*3/uL (ref 1.4–7.7)
Neutrophils Relative %: 53.8 % (ref 43.0–77.0)
Platelets: 275 10*3/uL (ref 150.0–400.0)
RBC: 5 Mil/uL (ref 3.87–5.11)
RDW: 14.6 % (ref 11.5–15.5)
WBC: 12.3 10*3/uL — AB (ref 4.0–10.5)

## 2016-06-03 NOTE — Patient Instructions (Signed)
GO TO THE LAB : Get the blood work       STOP BY THE FIRST FLOOR:  To get the ultrasound

## 2016-06-03 NOTE — Progress Notes (Signed)
Subjective:    Patient ID: Rebecca Elliott, female    DOB: Sep 03, 1926, 83 y.o.   MRN: [id number]  DOS:  06/03/2016 Type of visit - description : Acute visit Interval history: She had bilateral knee injections for DJD 05/27/2016. On 06-01-16 developed a right lower extremity edema from the mid calf down . Also a red rash at the pretibial area. Went to urgent care: no x-rays, ultrasound or  antibiotics were prescribed. They recommend leg elevation and close follow-up. She is here for a follow-up, rash looks much better, not as red. Swelling has significantly decreased but she has noted a lump at the site of the distal right pretibial area. The rash is not itchy.   Review of Systems  No fever chills No chest pain or difficulty breathing No prolonged car trips or airplane trips. No leg injury.  Past Medical History:  Diagnosis Date  . Allergic rhinitis   . COPD  and ASTHMA   . Depression   . Diabetes mellitus    as an adult  . DJD (degenerative joint disease)   . GERD (gastroesophageal reflux disease)    and HH w/ reflux  . History of colonic polyps   . Hyperlipidemia   . Hypertension     Past Surgical History:  Procedure Laterality Date  . ABDOMINAL HYSTERECTOMY     apparently no oophorectomy  . APPENDECTOMY    . bilateral breast tumor    . CATARACT EXTRACTION, BILATERAL    . CHOLECYSTECTOMY    . TONSILLECTOMY    . TUBAL LIGATION      Social History   Social History  . Marital status: Widowed    Spouse name: N/A  . Number of children: 5  . Years of education: N/A   Occupational History  . retired     Social History Main Topics  . Smoking status: Former Smoker    Packs/day: 1.00    Years: 35.00    Types: Cigarettes    Quit date: 01/18/1993  . Smokeless tobacco: Never Used  . Alcohol use No  . Drug use: No  . Sexual activity: Not on file   Other Topics Concern  . Not on file   Social History Narrative   widow (1994), children 5 (lost 3 sons,2 alive,  daughters)   2 daughter lives w/ her, both single     [name] 83 y/o   GGchildren x 2       Allergies as of 06/03/2016   No Known Allergies     Medication List       Accurate as of 06/03/16  9:53 AM. Always use your most recent med list.          albuterol 108 (90 Base) MCG/ACT inhaler Commonly known as:  VENTOLIN HFA Inhale 2 puffs into the lungs every 6 (six) hours as needed for wheezing or shortness of breath.   amLODipine 10 MG tablet Commonly known as:  NORVASC Take 1 tablet (10 mg total) by mouth daily.   aspirin 81 MG tablet Take 81 mg by mouth daily.   atorvastatin 10 MG tablet Commonly known as:  LIPITOR Take 1 tablet (10 mg total) by mouth daily.   BENEFIBER DRINK MIX PO Take by mouth 2 (two) times daily.   CENTRUM SILVER Chew Chew by mouth.   dextromethorphan-guaiFENesin 30-600 MG 12hr tablet Commonly known as:  MUCINEX DM Take 1 tablet by mouth daily.   docusate sodium 100 MG capsule Commonly known as:  COLACE  Take 1-2 by mouth once a day.   loratadine 10 MG tablet Commonly known as:  CLARITIN Take 10 mg by mouth daily.   losartan-hydrochlorothiazide 100-12.5 MG tablet Commonly known as:  HYZAAR Take 1 tablet by mouth daily.   metoprolol succinate 25 MG 24 hr tablet Commonly known as:  TOPROL-XL Take 1.5 tablets (37.5 mg total) by mouth daily.   pantoprazole 40 MG tablet Commonly known as:  PROTONIX Take 1 tablet (40 mg total) by mouth every other day.   ranitidine 150 MG tablet Commonly known as:  ZANTAC TAKE 1 TABLET AT BEDTIME   sitaGLIPtin 50 MG tablet Commonly known as:  JANUVIA Take 1 tablet (50 mg total) by mouth daily.   tiotropium 18 MCG inhalation capsule Commonly known as:  SPIRIVA HANDIHALER Place 1 capsule (18 mcg total) into inhaler and inhale daily.          Objective:   Physical Exam BP (!) 110/58 (BP Location: Left Arm, Patient Position: Sitting, Cuff Size: Large)   Pulse (!) 58   Temp 97.6 F (36.4 C) (Oral)    Ht 5\' 7"  (1.702 m)   Wt 190 lb 9.6 oz (86.5 kg)   SpO2 96%   BMI 29.85 kg/m   General:   Well developed, well nourished . NAD.  HEENT:  Normocephalic . Face symmetric, atraumatic Lungs:  CTA B Normal respiratory effort, no intercostal retractions, no accessory muscle use. Heart: RRR,  no murmur.  Lower extremities: Left leg normal Right leg: Palpable pedal pulses, warm toes. Skin : rash, compared to a few days ago much less red per pt. Calves: symmetric and not tender. She does have a soft bump/mass lateral from the pretibial area, see pictures. Neurologic:  alert & oriented X3.  Speech normal, gait appropriate for age and unassisted Psych--  Cognition and judgment appear intact.  Cooperative with normal attention span and concentration.  Behavior appropriate. No anxious or depressed appearing.     Picture taken 05-27-2016     Picture taken today 08-03-2016          Assessment & Plan:    Assessment  DM--no neuropathy, Metformin DC 04-2015 d/t creatinine HTN Hyperlipidemia Depression COPD - Asthma Pulmonary nodules per CXR 03-2014, CT 04/25/2014 confirmatory,saw pulmonary >> re do CT 1 year Anemia, chronic, on-off iron def  Colonoscopy 2004: No polyps EGD 2006 for dysphagia showed hiatal hernia, occult  stricture? Colonoscopy 2010 showed diverticuli Dysphagia: Barium swallow showed dysmotility 08-2014 DJD GERD  PLAN: Right leg swelling and a rash. No history of injury, she did have a local injections in both knees few days prior. No fever or chills. Etiology is unclear, the rash looks almost petechial but no other rashes noted. Will check a CBC. Also we'll get ultrasound to be sure there is no DVT. If workup okay, will  rec continue w/  leg elevation and call if not improving.

## 2016-06-04 NOTE — Assessment & Plan Note (Signed)
Right leg swelling and a rash. No history of injury, she did have a local injections in both knees few days prior. No fever or chills. Etiology is unclear, the rash looks almost petechial but no other rashes noted. Will check a CBC. Also we'll get ultrasound to be sure there is no DVT. If workup okay, will  rec continue w/  leg elevation and call if not improving.

## 2016-06-22 ENCOUNTER — Encounter (INDEPENDENT_AMBULATORY_CARE_PROVIDER_SITE_OTHER): Payer: Self-pay | Admitting: Orthopaedic Surgery

## 2016-06-22 ENCOUNTER — Ambulatory Visit (INDEPENDENT_AMBULATORY_CARE_PROVIDER_SITE_OTHER): Payer: Medicare Other | Admitting: Orthopaedic Surgery

## 2016-06-22 DIAGNOSIS — M1711 Unilateral primary osteoarthritis, right knee: Secondary | ICD-10-CM | POA: Insufficient documentation

## 2016-06-22 MED ORDER — TRAMADOL HCL 50 MG PO TABS: 50.0000 mg | ORAL_TABLET | Freq: Four times a day (QID) | ORAL | 0 refills | Status: DC | PRN

## 2016-06-22 MED ORDER — DICLOFENAC SODIUM 1 % TD GEL: 2.0000 g | Freq: Four times a day (QID) | TRANSDERMAL | 5 refills | Status: DC

## 2016-06-22 NOTE — Progress Notes (Signed)
Office Visit Note   Patient: Rebecca Elliott           Date of Birth: [birth year]           MRN: [id number] Visit Date: 06/22/2016              Requested by: Colon Branch, MD [address] [place], [place] [address] PCP: Colon Branch, MD   Assessment & Plan: Visit Diagnoses:  1. Primary osteoarthritis of right knee     Plan: We are long discussion today about treatment options. She has severe right knee osteoarthritis with degenerative valgus deformity. She has failed conservative treatment. I sterilely understand that at her age she is hesitant to undergo knee replacement however I do think that this would significantly improve her quality of life after she has rehabilitation from this. She understands the rehabilitation takes about 3 months. She would need short-term stay at a skilled nursing facility. 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 I gave her prescription for Voltaren gel. I don't think the Visco supplementation will give her any more relief then the cortisone injection. She will discuss this with her family and let us know. She would certainly need preoperative medical risk assessment if she decides to proceed with surgery. She will make an appointment with her primary care doctor if this is the case. She will be in touch with [place] regarding her decision. Total face to face encounter time was greater than 25 minutes and over half of this time was spent in counseling and/or coordination of care.  Follow-Up Instructions: Return if symptoms worsen or fail to improve.   Orders:  No orders of the defined types were placed in this encounter.  Meds ordered this encounter  Medications  . diclofenac sodium (VOLTAREN) 1 % GEL    Sig: Apply 2 g topically 4 (four) times daily.    Dispense:  1 Tube    Refill:  5  . traMADol (ULTRAM) 50 MG tablet    Sig: Take 1 tablet (50 mg total) by mouth every 6 (six) hours as needed.    Dispense:   30 tablet    Refill:  0      Procedures: No procedures performed   Clinical Data: No additional findings.   Subjective: Chief Complaint  Patient presents with  . Right Knee - Pain  . Left Knee - Pain    Ms. Gillum follows up today for severe right knee pain. The previous cortisone injection that we did provide her with only temporary relief during the anesthetic phase. She states that she is severely limited in terms of ADLs because of the right knee. 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 She is hesitant to undergo a knee replacement given her age. Her right knee is worse in the left knee. She walks with a cane.    Review of Systems  Constitutional: Negative.   HENT: Negative.   Eyes: Negative.   Respiratory: Negative.   Cardiovascular: Negative.   Endocrine: Negative.   Musculoskeletal: Negative.   Neurological: Negative.   Hematological: Negative.   Psychiatric/Behavioral: Negative.   All other systems reviewed and are negative.    Objective: Vital Signs: There were no vitals taken for this visit.  Physical Exam  Constitutional: She is oriented to person, place, and time. She appears well-developed and well-nourished.  Pulmonary/Chest: Effort normal.  Neurological: She is alert and oriented  to person, place, and time.  Skin: Skin is warm. Capillary refill takes less than 2 seconds.  Psychiatric: She has a normal mood and affect. Her behavior is normal. Judgment and thought content normal.  Nursing note and vitals reviewed.   Ortho Exam Stable with valgus alignment of right knee.     Specialty Comments:  No specialty comments available.  Imaging: No results found.   PMFS History: Patient Active Problem List   Diagnosis Date Noted  . Primary osteoarthritis of right knee 06/22/2016  . PCP NOTES >>>>>>>>>>>>> 11/19/2014  . Abdominal lymphadenopathy 08/09/2014  . Pulmonary nodules 05/01/2014  . Allergic rhinitis  04/18/2014  . Cough 04/18/2014  . Tremor 09/26/2013  . Elevated LFTs 08/13/2011  . Annual physical exam 07/10/2010  . DEGENERATIVE JOINT DISEASE 07/08/2009  . COLONIC POLYPS, ADENOMATOUS, HX OF 08/14/2008  . ALLERGIC RHINITIS 05/02/2007  . DMII (diabetes mellitus, type 2) ([place]) 12/12/2006  . HYPERLIPIDEMIA 10/10/2006  . DEPRESSION 10/10/2006  . GERD 10/10/2006  . HIATAL HERNIA WITH REFLUX 10/10/2006  . Essential hypertension 03/13/2006  . COPD and Asthma 03/13/2006   Past Medical History:  Diagnosis Date  . Allergic rhinitis   . COPD  and ASTHMA   . Depression   . Diabetes mellitus    as an adult  . DJD (degenerative joint disease)   . GERD (gastroesophageal reflux disease)    and HH w/ reflux  . History of colonic polyps   . Hyperlipidemia   . Hypertension     Family History  Problem Relation Age of Onset  . Breast cancer Sister        x 2  . Diabetes Father   . Diabetes Sister   . Heart attack Brother        early 23s  . Heart attack Son   . Pancreatic cancer Son   . Stomach cancer Sister   . Colon cancer Neg Hx     Past Surgical History:  Procedure Laterality Date  . ABDOMINAL HYSTERECTOMY     apparently no oophorectomy  . APPENDECTOMY    . bilateral breast tumor    . CATARACT EXTRACTION, BILATERAL    . CHOLECYSTECTOMY    . TONSILLECTOMY    . TUBAL LIGATION     Social History   Occupational History  . retired     Social History Main Topics  . Smoking status: Former Smoker    Packs/day: 1.00    Years: 35.00    Types: Cigarettes    Quit date: 01/18/1993  . Smokeless tobacco: Never Used  . Alcohol use No  . Drug use: No  . Sexual activity: Not on file

## 2016-06-29 ENCOUNTER — Encounter: Payer: Self-pay | Admitting: Adult Health

## 2016-06-29 NOTE — Telephone Encounter (Signed)
RA - please advise. Thanks! 

## 2016-06-30 NOTE — Telephone Encounter (Signed)
Probably meant to send to PCP - pl direct Last seen 01/2015 for pulmonary nodules - I see that she mayb e due for follow up CT chest no contrast , then FU with TP

## 2016-07-09 ENCOUNTER — Other Ambulatory Visit: Payer: Self-pay | Admitting: Internal Medicine

## 2016-07-12 ENCOUNTER — Telehealth (INDEPENDENT_AMBULATORY_CARE_PROVIDER_SITE_OTHER): Payer: Self-pay | Admitting: Orthopaedic Surgery

## 2016-07-12 NOTE — Telephone Encounter (Signed)
Done. Returned call patients daughter is aware of $5 CD fee

## 2016-07-12 NOTE — Telephone Encounter (Signed)
Patient's daughter Rebecca Elliott called advised she need to pick up her mother's X-rays. Patient is going to see another doctor for second opinion. Rebecca Elliott advised her mother's appointment is in the morning. The number to contact Rebecca Elliott is [phone]

## 2016-07-13 DIAGNOSIS — M25562 Pain in left knee: Secondary | ICD-10-CM | POA: Diagnosis not present

## 2016-07-13 DIAGNOSIS — R262 Difficulty in walking, not elsewhere classified: Secondary | ICD-10-CM | POA: Diagnosis not present

## 2016-07-13 DIAGNOSIS — M170 Bilateral primary osteoarthritis of knee: Secondary | ICD-10-CM | POA: Diagnosis not present

## 2016-07-13 DIAGNOSIS — M25561 Pain in right knee: Secondary | ICD-10-CM | POA: Diagnosis not present

## 2016-07-22 DIAGNOSIS — M25561 Pain in right knee: Secondary | ICD-10-CM | POA: Diagnosis not present

## 2016-07-22 DIAGNOSIS — M25562 Pain in left knee: Secondary | ICD-10-CM | POA: Diagnosis not present

## 2016-07-22 DIAGNOSIS — M170 Bilateral primary osteoarthritis of knee: Secondary | ICD-10-CM | POA: Diagnosis not present

## 2016-07-29 DIAGNOSIS — M170 Bilateral primary osteoarthritis of knee: Secondary | ICD-10-CM | POA: Diagnosis not present

## 2016-07-29 DIAGNOSIS — M25562 Pain in left knee: Secondary | ICD-10-CM | POA: Diagnosis not present

## 2016-07-29 DIAGNOSIS — M25561 Pain in right knee: Secondary | ICD-10-CM | POA: Diagnosis not present

## 2016-08-05 ENCOUNTER — Other Ambulatory Visit: Payer: Self-pay | Admitting: Gastroenterology

## 2016-08-05 DIAGNOSIS — M170 Bilateral primary osteoarthritis of knee: Secondary | ICD-10-CM | POA: Diagnosis not present

## 2016-08-05 DIAGNOSIS — M25562 Pain in left knee: Secondary | ICD-10-CM | POA: Diagnosis not present

## 2016-08-05 DIAGNOSIS — M25561 Pain in right knee: Secondary | ICD-10-CM | POA: Diagnosis not present

## 2016-08-12 DIAGNOSIS — M25561 Pain in right knee: Secondary | ICD-10-CM | POA: Diagnosis not present

## 2016-08-12 DIAGNOSIS — M25562 Pain in left knee: Secondary | ICD-10-CM | POA: Diagnosis not present

## 2016-08-12 DIAGNOSIS — M170 Bilateral primary osteoarthritis of knee: Secondary | ICD-10-CM | POA: Diagnosis not present

## 2016-08-24 ENCOUNTER — Encounter: Payer: Self-pay | Admitting: Internal Medicine

## 2016-08-24 ENCOUNTER — Ambulatory Visit (INDEPENDENT_AMBULATORY_CARE_PROVIDER_SITE_OTHER): Payer: Medicare Other | Admitting: Internal Medicine

## 2016-08-24 VITALS — BP 128/76 | HR 73 | Temp 98.2°F | Resp 14 | Ht 67.0 in | Wt 198.0 lb

## 2016-08-24 DIAGNOSIS — E119 Type 2 diabetes mellitus without complications: Secondary | ICD-10-CM | POA: Diagnosis not present

## 2016-08-24 DIAGNOSIS — I10 Essential (primary) hypertension: Secondary | ICD-10-CM

## 2016-08-24 DIAGNOSIS — M1991 Primary osteoarthritis, unspecified site: Secondary | ICD-10-CM

## 2016-08-24 LAB — BASIC METABOLIC PANEL
BUN: 24 mg/dL — AB (ref 6–23)
CHLORIDE: 101 meq/L (ref 96–112)
CO2: 29 meq/L (ref 19–32)
Calcium: 9.1 mg/dL (ref 8.4–10.5)
Creatinine, Ser: 1.25 mg/dL — ABNORMAL HIGH (ref 0.40–1.20)
GFR: 42.94 mL/min — ABNORMAL LOW (ref >60.00)
GLUCOSE: 125 mg/dL — AB (ref 70–99)
POTASSIUM: 4.1 meq/L (ref 3.5–5.1)
Sodium: 137 mEq/L (ref 135–145)

## 2016-08-24 LAB — HEMOGLOBIN A1C: Hgb A1c MFr Bld: 6.3 % (ref 4.6–6.5)
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
## 2016-08-24 NOTE — Patient Instructions (Signed)
GO TO THE LAB : Get the blood work     GO TO THE FRONT DESK Schedule your next appointment for a   routine checkup in 4 months 

## 2016-08-24 NOTE — Progress Notes (Signed)
Pre visit review using our clinic review tool, if applicable. No additional management support is needed unless otherwise documented below in the visit note. 
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
## 2016-10-08 ENCOUNTER — Other Ambulatory Visit: Payer: Self-pay | Admitting: Internal Medicine

## 2016-10-24 ENCOUNTER — Other Ambulatory Visit: Payer: Self-pay | Admitting: Internal Medicine

## 2016-10-25 ENCOUNTER — Other Ambulatory Visit: Payer: Self-pay | Admitting: Gastroenterology

## 2016-11-15 DIAGNOSIS — Z23 Encounter for immunization: Secondary | ICD-10-CM | POA: Diagnosis not present

## 2016-11-29 DIAGNOSIS — M25561 Pain in right knee: Secondary | ICD-10-CM | POA: Diagnosis not present

## 2016-11-29 DIAGNOSIS — M25562 Pain in left knee: Secondary | ICD-10-CM | POA: Diagnosis not present

## 2016-11-29 DIAGNOSIS — M21161 Varus deformity, not elsewhere classified, right knee: Secondary | ICD-10-CM | POA: Diagnosis not present

## 2016-11-29 DIAGNOSIS — M21062 Valgus deformity, not elsewhere classified, left knee: Secondary | ICD-10-CM | POA: Diagnosis not present

## 2016-11-29 DIAGNOSIS — M170 Bilateral primary osteoarthritis of knee: Secondary | ICD-10-CM | POA: Diagnosis not present

## 2016-12-07 DIAGNOSIS — M25561 Pain in right knee: Secondary | ICD-10-CM | POA: Diagnosis not present

## 2016-12-07 DIAGNOSIS — M170 Bilateral primary osteoarthritis of knee: Secondary | ICD-10-CM | POA: Diagnosis not present

## 2016-12-07 DIAGNOSIS — M25562 Pain in left knee: Secondary | ICD-10-CM | POA: Diagnosis not present

## 2016-12-14 DIAGNOSIS — M25562 Pain in left knee: Secondary | ICD-10-CM | POA: Diagnosis not present

## 2016-12-14 DIAGNOSIS — M25561 Pain in right knee: Secondary | ICD-10-CM | POA: Diagnosis not present

## 2016-12-14 DIAGNOSIS — M170 Bilateral primary osteoarthritis of knee: Secondary | ICD-10-CM | POA: Diagnosis not present

## 2016-12-22 ENCOUNTER — Ambulatory Visit: Payer: Medicare Other | Admitting: Internal Medicine

## 2016-12-28 ENCOUNTER — Ambulatory Visit: Payer: Medicare Other | Admitting: Internal Medicine

## 2017-01-20 ENCOUNTER — Ambulatory Visit (INDEPENDENT_AMBULATORY_CARE_PROVIDER_SITE_OTHER): Payer: Medicare Other | Admitting: Internal Medicine

## 2017-01-20 ENCOUNTER — Encounter: Payer: Self-pay | Admitting: Internal Medicine

## 2017-01-20 VITALS — BP 128/80 | HR 77 | Temp 97.7°F | Resp 14 | Ht 67.0 in | Wt 201.2 lb

## 2017-01-20 DIAGNOSIS — E119 Type 2 diabetes mellitus without complications: Secondary | ICD-10-CM | POA: Diagnosis not present

## 2017-01-20 DIAGNOSIS — D72829 Elevated white blood cell count, unspecified: Secondary | ICD-10-CM

## 2017-01-20 DIAGNOSIS — E785 Hyperlipidemia, unspecified: Secondary | ICD-10-CM

## 2017-01-20 DIAGNOSIS — I10 Essential (primary) hypertension: Secondary | ICD-10-CM

## 2017-01-20 LAB — CBC WITH DIFFERENTIAL/PLATELET
Basophils Absolute: 0.1 10*3/uL (ref 0.0–0.1)
Basophils Relative: 0.9 % (ref 0.0–3.0)
EOS ABS: 0.2 10*3/uL (ref 0.0–0.7)
Eosinophils Relative: 1.8 % (ref 0.0–5.0)
HEMATOCRIT: 43.1 % (ref 36.0–46.0)
Hemoglobin: 13.9 g/dL (ref 12.0–15.0)
LYMPHS PCT: 33.1 % (ref 12.0–46.0)
Lymphs Abs: 3.5 10*3/uL (ref 0.7–4.0)
MCHC: 32.4 g/dL (ref 30.0–36.0)
MCV: 87.7 fl (ref 78.0–100.0)
MONOS PCT: 9.1 % (ref 3.0–12.0)
Monocytes Absolute: 0.9 10*3/uL (ref 0.1–1.0)
NEUTROS ABS: 5.8 10*3/uL (ref 1.4–7.7)
NEUTROS PCT: 55.1 % (ref 43.0–77.0)
PLATELETS: 251 10*3/uL (ref 150.0–400.0)
RBC: 4.91 Mil/uL (ref 3.87–5.11)
RDW: 13.6 % (ref 11.5–15.5)
WBC: 10.4 10*3/uL (ref 4.0–10.5)

## 2017-01-20 LAB — LIPID PANEL
CHOL/HDL RATIO: 4
Cholesterol: 122 mg/dL (ref 0–200)
HDL: 34.7 mg/dL — ABNORMAL LOW (ref >39.00)
LDL CALC: 62 mg/dL (ref 0–99)
NONHDL: 86.93
Triglycerides: 123 mg/dL (ref 0.0–149.0)
VLDL: 24.6 mg/dL (ref 0.0–40.0)

## 2017-01-20 LAB — HEMOGLOBIN A1C: HEMOGLOBIN A1C: 6.3 % (ref 4.6–6.5)

## 2017-01-20 LAB — ALT: ALT: 13 U/L (ref 0–35)

## 2017-01-20 LAB — AST: AST: 23 U/L (ref 0–37)

## 2017-01-20 NOTE — Progress Notes (Signed)
Pre visit review using our clinic review tool, if applicable. No additional management support is needed unless otherwise documented below in the visit note. 

## 2017-01-20 NOTE — Patient Instructions (Signed)
GO TO THE LAB : Get the blood work     GO TO THE FRONT DESK Schedule your next appointment for a checkup in 4 months   Check the  blood pressure  Monthly  Be sure your blood pressure is between 110/65 and  135/85. If it is consistently higher or lower, let me know

## 2017-01-20 NOTE — Progress Notes (Signed)
Subjective:    Patient ID: Rebecca Elliott, female    DOB: 1926/11/28, 84 y.o.   MRN: [id number]  DOS:  01/20/2017 Type of visit - description : rov Interval history: HTN: No recent ambulatory BPs however has not been taking Hyzaar for several months, unclear why, "just run out" DM: Good compliance with medications, due for A1c High cholesterol: Good med compliance without apparent side effects, due for labs GERD: Symptoms controlled.   Review of Systems She has DJD, uses a R knee  brace, that has helped with the pain.  History of falls but nothing recent. She lives at home with her 2 daughters, she is independent in all her ADLs, does not get out of her house frequently. Denies chest pain or difficulty breathing No nausea, vomiting, diarrhea.  No blood in the stools. No easy bleeding in the skin or gums.  Past Medical History:  Diagnosis Date  . Allergic rhinitis   . COPD  and ASTHMA   . Depression   . Diabetes mellitus    as an adult  . DJD (degenerative joint disease)   . GERD (gastroesophageal reflux disease)    and HH w/ reflux  . History of colonic polyps   . Hyperlipidemia   . Hypertension     Past Surgical History:  Procedure Laterality Date  . ABDOMINAL HYSTERECTOMY     apparently no oophorectomy  . APPENDECTOMY    . bilateral breast tumor    . CATARACT EXTRACTION, BILATERAL    . CHOLECYSTECTOMY    . TONSILLECTOMY    . TUBAL LIGATION      Social History   Socioeconomic History  . Marital status: Widowed    Spouse name: Not on file  . Number of children: 5  . Years of education: Not on file  . Highest education level: Not on file  Social Needs  . Financial resource strain: Not on file  . Food insecurity - worry: Not on file  . Food insecurity - inability: Not on file  . Transportation needs - medical: Not on file  . Transportation needs - non-medical: Not on file  Occupational History  . Occupation: retired   Tobacco Use  . Smoking status: Former  Smoker    Packs/day: 1.00    Years: 35.00    Pack years: 35.00    Types: Cigarettes    Last attempt to quit: 01/18/1993    Years since quitting: 24.0  . Smokeless tobacco: Never Used  Substance and Sexual Activity  . Alcohol use: No    Alcohol/week: 0.0 oz  . Drug use: No  . Sexual activity: Not on file  Other Topics Concern  . Not on file  Social History Narrative   Household: pt and 2 daughters   widow (1994), children 5 (lost 3 sons,2 alive, daughters)   Rebecca Elliott 78 y/o   GGchildren x 2       Allergies as of 01/20/2017   No Known Allergies     Medication List        Accurate as of 01/20/17 11:59 PM. Always use your most recent med list.          albuterol 108 (90 Base) MCG/ACT inhaler Commonly known as:  VENTOLIN HFA Inhale 2 puffs into the lungs every 6 (six) hours as needed for wheezing or shortness of breath.   amLODipine 10 MG tablet Commonly known as:  NORVASC Take 1 tablet (10 mg total) by mouth daily.   aspirin 81 MG  tablet Take 81 mg by mouth daily.   atorvastatin 10 MG tablet Commonly known as:  LIPITOR Take 1 tablet (10 mg total) by mouth daily.   BENEFIBER DRINK MIX PO Take by mouth 2 (two) times daily.   CENTRUM SILVER Chew Chew by mouth.   dextromethorphan-guaiFENesin 30-600 MG 12hr tablet Commonly known as:  MUCINEX DM Take 1 tablet by mouth daily.   diclofenac sodium 1 % Gel Commonly known as:  VOLTAREN Apply 2 g topically 4 (four) times daily.   docusate sodium 100 MG capsule Commonly known as:  COLACE Take 1-2 by mouth once a day.   loratadine 10 MG tablet Commonly known as:  CLARITIN Take 10 mg by mouth daily.   metoprolol succinate 25 MG 24 hr tablet Commonly known as:  TOPROL-XL Take 1.5 tablets (37.5 mg total) by mouth daily.   pantoprazole 40 MG tablet Commonly known as:  PROTONIX Take 1 tablet (40 mg total) by mouth every other day.   ranitidine 150 MG tablet Commonly known as:  ZANTAC TAKE 1 TABLET AT BEDTIME     sitaGLIPtin 50 MG tablet Commonly known as:  JANUVIA Take 1 tablet (50 mg total) by mouth daily.   tiotropium 18 MCG inhalation capsule Commonly known as:  SPIRIVA HANDIHALER Place 1 capsule (18 mcg total) into inhaler and inhale daily.          Objective:   Physical Exam BP 128/80 (BP Location: Left Arm, Patient Position: Sitting, Cuff Size: Small)   Pulse 77   Temp 97.7 F (36.5 C) (Oral)   Resp 14   Ht 5\' 7"  (1.702 m)   Wt 201 lb 4 oz (91.3 kg)   SpO2 98%   BMI 31.52 kg/m  General:   Well developed, well nourished . NAD.  HEENT:  Normocephalic . Face symmetric, atraumatic Lungs:  CTA B Normal respiratory effort, no intercostal retractions, no accessory muscle use. Heart: RRR,  no murmur.  No pretibial edema bilaterally  Skin: Not pale. Not jaundice Neurologic:  alert & oriented X3.  Speech normal, gait appropriate for age and assisted by a cane, wears a right knee brace. Psych--  Cognition and judgment appear intact.  Cooperative with normal attention span and concentration.  Behavior appropriate. No anxious or depressed appearing.      Assessment & Plan:   Assessment  DM--no neuropathy, Metformin DC 04-2015 d/t creatinine HTN (off Hyzaar as of 01-2016) Hyperlipidemia Depression COPD - Asthma Pulmonary nodules per CXR 03-2014, CT 03-2016, stable, no further CTs needed Anemia, chronic, on-off iron def  Colonoscopy 2004: No polyps EGD 2006 for dysphagia showed hiatal hernia, occult  stricture? Colonoscopy 2010 showed diverticuli Dysphagia: Barium swallow showed dysmotility 08-2014 DJD GERD  PLAN: DM: Continue Januvia, checking an A1c HTN: On amlodipine, metoprolol.  She has not been taking Hyzaar for several months, unclear why, she is not sure.  BP remains normal.  For now will not restart Hyzaar, recommend to monitor BPs monthly.  Last BMP satisfactory Hyperlipidemia: On Lipitor, check a AST, ALT, FLP Depression: Not an issue at this time Elevated  WBCs: Mild leukocytosis, recheck a CBC and a peripheral blood smear RTC 4 months.

## 2017-01-21 LAB — PATHOLOGIST SMEAR REVIEW

## 2017-01-21 NOTE — Assessment & Plan Note (Signed)
DM: Continue Januvia, checking an A1c HTN: On amlodipine, metoprolol.  She has not been taking Hyzaar for several months, unclear why, she is not sure.  BP remains normal.  For now will not restart Hyzaar, recommend to monitor BPs monthly.  Last BMP satisfactory Hyperlipidemia: On Lipitor, check a AST, ALT, FLP Depression: Not an issue at this time Elevated WBCs: Mild leukocytosis, recheck a CBC and a peripheral blood smear RTC 4 months.

## 2017-01-23 ENCOUNTER — Other Ambulatory Visit: Payer: Self-pay | Admitting: Gastroenterology

## 2017-02-27 ENCOUNTER — Other Ambulatory Visit: Payer: Self-pay | Admitting: Internal Medicine

## 2017-03-14 DIAGNOSIS — D225 Melanocytic nevi of trunk: Secondary | ICD-10-CM | POA: Diagnosis not present

## 2017-03-14 DIAGNOSIS — D485 Neoplasm of uncertain behavior of skin: Secondary | ICD-10-CM | POA: Diagnosis not present

## 2017-03-14 DIAGNOSIS — C44319 Basal cell carcinoma of skin of other parts of face: Secondary | ICD-10-CM | POA: Diagnosis not present

## 2017-03-14 DIAGNOSIS — L814 Other melanin hyperpigmentation: Secondary | ICD-10-CM | POA: Diagnosis not present

## 2017-03-14 DIAGNOSIS — L821 Other seborrheic keratosis: Secondary | ICD-10-CM | POA: Diagnosis not present

## 2017-03-14 DIAGNOSIS — Z23 Encounter for immunization: Secondary | ICD-10-CM | POA: Diagnosis not present

## 2017-03-14 DIAGNOSIS — L820 Inflamed seborrheic keratosis: Secondary | ICD-10-CM | POA: Diagnosis not present

## 2017-03-14 DIAGNOSIS — D1801 Hemangioma of skin and subcutaneous tissue: Secondary | ICD-10-CM | POA: Diagnosis not present

## 2017-03-15 DIAGNOSIS — M1711 Unilateral primary osteoarthritis, right knee: Secondary | ICD-10-CM | POA: Diagnosis not present

## 2017-03-15 DIAGNOSIS — M170 Bilateral primary osteoarthritis of knee: Secondary | ICD-10-CM | POA: Diagnosis not present

## 2017-03-15 DIAGNOSIS — R269 Unspecified abnormalities of gait and mobility: Secondary | ICD-10-CM | POA: Diagnosis not present

## 2017-03-15 DIAGNOSIS — M25561 Pain in right knee: Secondary | ICD-10-CM | POA: Diagnosis not present

## 2017-03-15 DIAGNOSIS — M25562 Pain in left knee: Secondary | ICD-10-CM | POA: Diagnosis not present

## 2017-03-15 DIAGNOSIS — M21162 Varus deformity, not elsewhere classified, left knee: Secondary | ICD-10-CM | POA: Diagnosis not present

## 2017-03-15 DIAGNOSIS — M21061 Valgus deformity, not elsewhere classified, right knee: Secondary | ICD-10-CM | POA: Diagnosis not present

## 2017-03-22 DIAGNOSIS — M1712 Unilateral primary osteoarthritis, left knee: Secondary | ICD-10-CM | POA: Diagnosis not present

## 2017-03-22 DIAGNOSIS — M25562 Pain in left knee: Secondary | ICD-10-CM | POA: Diagnosis not present

## 2017-03-24 DIAGNOSIS — M25561 Pain in right knee: Secondary | ICD-10-CM | POA: Diagnosis not present

## 2017-03-24 DIAGNOSIS — M1711 Unilateral primary osteoarthritis, right knee: Secondary | ICD-10-CM | POA: Diagnosis not present

## 2017-03-29 DIAGNOSIS — M1712 Unilateral primary osteoarthritis, left knee: Secondary | ICD-10-CM | POA: Diagnosis not present

## 2017-03-29 DIAGNOSIS — M25562 Pain in left knee: Secondary | ICD-10-CM | POA: Diagnosis not present

## 2017-04-06 ENCOUNTER — Other Ambulatory Visit: Payer: Self-pay | Admitting: Family Medicine

## 2017-04-06 ENCOUNTER — Other Ambulatory Visit: Payer: Self-pay | Admitting: Internal Medicine

## 2017-04-06 DIAGNOSIS — Z1231 Encounter for screening mammogram for malignant neoplasm of breast: Secondary | ICD-10-CM

## 2017-04-07 DIAGNOSIS — L918 Other hypertrophic disorders of the skin: Secondary | ICD-10-CM | POA: Diagnosis not present

## 2017-04-07 DIAGNOSIS — C44310 Basal cell carcinoma of skin of unspecified parts of face: Secondary | ICD-10-CM | POA: Diagnosis not present

## 2017-04-07 DIAGNOSIS — L820 Inflamed seborrheic keratosis: Secondary | ICD-10-CM | POA: Diagnosis not present

## 2017-04-07 DIAGNOSIS — L905 Scar conditions and fibrosis of skin: Secondary | ICD-10-CM | POA: Diagnosis not present

## 2017-04-11 DIAGNOSIS — L918 Other hypertrophic disorders of the skin: Secondary | ICD-10-CM | POA: Diagnosis not present

## 2017-04-11 DIAGNOSIS — C44310 Basal cell carcinoma of skin of unspecified parts of face: Secondary | ICD-10-CM | POA: Diagnosis not present

## 2017-04-11 DIAGNOSIS — L905 Scar conditions and fibrosis of skin: Secondary | ICD-10-CM | POA: Diagnosis not present

## 2017-04-23 ENCOUNTER — Other Ambulatory Visit: Payer: Self-pay | Admitting: Internal Medicine

## 2017-04-23 ENCOUNTER — Other Ambulatory Visit: Payer: Self-pay | Admitting: Gastroenterology

## 2017-04-27 ENCOUNTER — Encounter (HOSPITAL_BASED_OUTPATIENT_CLINIC_OR_DEPARTMENT_OTHER): Payer: Self-pay

## 2017-04-27 ENCOUNTER — Ambulatory Visit (HOSPITAL_BASED_OUTPATIENT_CLINIC_OR_DEPARTMENT_OTHER)
Admission: RE | Admit: 2017-04-27 | Discharge: 2017-04-27 | Disposition: A | Discharge status: Home / Self Care | Payer: Medicare Other | Source: Ambulatory Visit | Attending: Internal Medicine | Admitting: Internal Medicine

## 2017-04-27 DIAGNOSIS — Z1231 Encounter for screening mammogram for malignant neoplasm of breast: Secondary | ICD-10-CM | POA: Diagnosis not present

## 2017-05-12 ENCOUNTER — Other Ambulatory Visit: Payer: Self-pay | Admitting: Internal Medicine

## 2017-05-19 ENCOUNTER — Other Ambulatory Visit: Payer: Self-pay

## 2017-05-20 ENCOUNTER — Ambulatory Visit (INDEPENDENT_AMBULATORY_CARE_PROVIDER_SITE_OTHER): Payer: Medicare Other | Admitting: Internal Medicine

## 2017-05-20 ENCOUNTER — Encounter: Payer: Self-pay | Admitting: Internal Medicine

## 2017-05-20 VITALS — BP 128/66 | HR 69 | Temp 97.5°F | Resp 16 | Ht 67.0 in | Wt 201.4 lb

## 2017-05-20 DIAGNOSIS — M1991 Primary osteoarthritis, unspecified site: Secondary | ICD-10-CM | POA: Diagnosis not present

## 2017-05-20 DIAGNOSIS — Z23 Encounter for immunization: Secondary | ICD-10-CM | POA: Diagnosis not present

## 2017-05-20 DIAGNOSIS — I10 Essential (primary) hypertension: Secondary | ICD-10-CM

## 2017-05-20 DIAGNOSIS — E1159 Type 2 diabetes mellitus with other circulatory complications: Secondary | ICD-10-CM | POA: Diagnosis not present

## 2017-05-20 LAB — BASIC METABOLIC PANEL
BUN: 27 mg/dL — AB (ref 6–23)
CALCIUM: 9.1 mg/dL (ref 8.4–10.5)
CHLORIDE: 102 meq/L (ref 96–112)
CO2: 31 mEq/L (ref 19–32)
CREATININE: 1.27 mg/dL — AB (ref 0.40–1.20)
GFR: 42.09 mL/min — ABNORMAL LOW (ref >60.00)
Glucose, Bld: 141 mg/dL — ABNORMAL HIGH (ref 70–99)
Potassium: 4.3 mEq/L (ref 3.5–5.1)
Sodium: 140 mEq/L (ref 135–145)

## 2017-05-20 LAB — HEMOGLOBIN A1C: HEMOGLOBIN A1C: 6.4 % (ref 4.6–6.5)

## 2017-05-20 MED ORDER — LOSARTAN POTASSIUM-HCTZ 100-12.5 MG PO TABS: 1.0000 | ORAL_TABLET | Freq: Every day | ORAL | 0 refills | Status: DC

## 2017-05-20 NOTE — Progress Notes (Signed)
Subjective:    Patient ID: Rebecca Elliott, female    DOB: 05/08/1926, 84 y.o.   MRN: [id number]  DOS:  05/20/2017 Type of visit - description : rov Interval history: HTN: Today she realizes that she has been taking Hyzaar all along (see last visit) Ambulatory BPs normal Continue with right knee pain, currently seeing a different physician, using a brace.  Able to do all her ADLs but not to exercise.   Review of Systems Denies chest pain or shortness of breath at rest, some DOE, started to notice when she stopped regular exercise. No nausea or vomiting Minimal cough on and off, she thinks related to the allergies.  No wheezing.  Past Medical History:  Diagnosis Date  . Allergic rhinitis   . COPD  and ASTHMA   . Depression   . Diabetes mellitus    as an adult  . DJD (degenerative joint disease)   . GERD (gastroesophageal reflux disease)    and HH w/ reflux  . History of colonic polyps   . Hyperlipidemia   . Hypertension     Past Surgical History:  Procedure Laterality Date  . ABDOMINAL HYSTERECTOMY     apparently no oophorectomy  . APPENDECTOMY    . bilateral breast tumor    . BREAST EXCISIONAL BIOPSY Bilateral    one in her 20's and one in her 50's, both benign  . CATARACT EXTRACTION, BILATERAL    . CHOLECYSTECTOMY    . TONSILLECTOMY    . TUBAL LIGATION      Social History   Socioeconomic History  . Marital status: Widowed    Spouse name: Not on file  . Number of children: 5  . Years of education: Not on file  . Highest education level: Not on file  Occupational History  . Occupation: retired   Scientific laboratory technician  . Financial resource strain: Not on file  . Food insecurity:    Worry: Not on file    Inability: Not on file  . Transportation needs:    Medical: Not on file    Non-medical: Not on file  Tobacco Use  . Smoking status: Former Smoker    Packs/day: 1.00    Years: 35.00    Pack years: 35.00    Types: Cigarettes    Last attempt to quit: 01/18/1993   Years since quitting: 24.3  . Smokeless tobacco: Never Used  Substance and Sexual Activity  . Alcohol use: No    Alcohol/week: 0.0 oz  . Drug use: No  . Sexual activity: Not on file  Lifestyle  . Physical activity:    Days per week: Not on file    Minutes per session: Not on file  . Stress: Not on file  Relationships  . Social connections:    Talks on phone: Not on file    Gets together: Not on file    Attends religious service: Not on file    Active member of club or organization: Not on file    Attends meetings of clubs or organizations: Not on file    Relationship status: Not on file  . Intimate partner violence:    Fear of current or ex partner: Not on file    Emotionally abused: Not on file    Physically abused: Not on file    Forced sexual activity: Not on file  Other Topics Concern  . Not on file  Social History Narrative   Household: pt and 2 daughters   widow (1994),  children 5 (lost 3 sons,2 alive, daughters)   [name] 45 y/o   GGchildren x 2       Allergies as of 05/20/2017   No Known Allergies     Medication List        Accurate as of 05/20/17 11:59 PM. Always use your most recent med list.          albuterol 108 (90 Base) MCG/ACT inhaler Commonly known as:  VENTOLIN HFA Inhale 2 puffs into the lungs every 6 (six) hours as needed for wheezing or shortness of breath.   amLODipine 10 MG tablet Commonly known as:  NORVASC Take 1 tablet (10 mg total) by mouth daily.   aspirin 81 MG tablet Take 81 mg by mouth daily.   atorvastatin 10 MG tablet Commonly known as:  LIPITOR Take 1 tablet (10 mg total) by mouth daily.   BENEFIBER DRINK MIX PO Take by mouth 2 (two) times daily.   CENTRUM SILVER Chew Chew by mouth.   dextromethorphan-guaiFENesin 30-600 MG 12hr tablet Commonly known as:  MUCINEX DM Take 1 tablet by mouth daily.   diclofenac sodium 1 % Gel Commonly known as:  VOLTAREN Apply 2 g topically 4 (four) times daily.   docusate sodium 100  MG capsule Commonly known as:  COLACE Take 1-2 by mouth once a day.   loratadine 10 MG tablet Commonly known as:  CLARITIN Take 10 mg by mouth daily.   losartan-hydrochlorothiazide 100-12.5 MG tablet Commonly known as:  HYZAAR Take 1 tablet by mouth daily.   metoprolol succinate 25 MG 24 hr tablet Commonly known as:  TOPROL-XL Take 1.5 tablets (37.5 mg total) by mouth daily.   pantoprazole 40 MG tablet Commonly known as:  PROTONIX Take 1 tablet (40 mg total) by mouth every other day.   ranitidine 150 MG tablet Commonly known as:  ZANTAC TAKE 1 TABLET AT BEDTIME   sitaGLIPtin 50 MG tablet Commonly known as:  JANUVIA Take 1 tablet (50 mg total) by mouth daily.   tiotropium 18 MCG inhalation capsule Commonly known as:  SPIRIVA HANDIHALER Place 1 capsule (18 mcg total) into inhaler and inhale daily.          Objective:   Physical Exam BP 128/66 (BP Location: Left Arm, Patient Position: Sitting, Cuff Size: Small)   Pulse 69   Temp (!) 97.5 F (36.4 C) (Oral)   Resp 16   Ht 5\' 7"  (1.702 m)   Wt 201 lb 6 oz (91.3 kg)   SpO2 94%   BMI 31.54 kg/m  General:   Well developed, well nourished . NAD.  HEENT:  Normocephalic . Face symmetric, atraumatic Lungs:  CTA B Normal respiratory effort, no intercostal retractions, no accessory muscle use. Heart: RRR,  no murmur.  No pretibial edema bilaterally  Skin: Not pale. Not jaundice  MSK: Has a brace on the right knee. Neurologic:  alert & oriented X3.  Speech normal, gait assisted by a cane and limited due to DJD DIABETIC FEET EXAM: No lower extremity edema Normal pedal pulses bilaterally Skin normal, nails normal, no calluses Pinprick examination of the feet normal. Psych--  Cognition and judgment appear intact.  Cooperative with normal attention span and concentration.  Behavior appropriate. No anxious or depressed appearing.      Assessment & Plan:   Assessment  DM--no neuropathy, Metformin DC 04-2015 d/t  creatinine HTN (off Hyzaar as of 01-2016) Hyperlipidemia Depression COPD - Asthma Pulmonary nodules per CXR 03-2014, CT 03-2016, stable, no further CTs  needed Anemia, chronic, on-off iron def  Colonoscopy 2004: No polyps EGD 2006 for dysphagia showed hiatal hernia, occult  stricture? Colonoscopy 2010 showed diverticuli Dysphagia: Barium swallow showed dysmotility 08-2014 DJD GERD  PLAN: DM: Continue Januvia, check a A1c, no evidence of neuropathy on exam, recommend an eye check. HTN: See last visit, she was confused and indeed has been taking Hyzaar every day.  Request a refill. Plan: Check a BMP, refill Hyzaar locally, refill 90-day supplies with results.  BP today is very good. DJD: Using a brace, fall prevention discussed, using a cane, reluctant to use a walker. Primary care: [facility] 23 today RTC 6 months

## 2017-05-20 NOTE — Progress Notes (Signed)
Pre visit review using our clinic review tool, if applicable. No additional management support is needed unless otherwise documented below in the visit note. 

## 2017-05-20 NOTE — Patient Instructions (Signed)
GO TO THE LAB : Get the blood work     GO TO THE FRONT DESK Schedule your next appointment for a   checkup in 6 months, fasting

## 2017-05-21 NOTE — Assessment & Plan Note (Signed)
DM: Continue Januvia, check a A1c, no evidence of neuropathy on exam, recommend an eye check. HTN: See last visit, she was confused and indeed has been taking Hyzaar every day.  Request a refill. Plan: Check a BMP, refill Hyzaar locally, refill 90-day supplies with results.  BP today is very good. DJD: Using a brace, fall prevention discussed, using a cane, reluctant to use a walker. Primary care: P[facility] 23 today RTC 6 months

## 2017-05-23 MED ORDER — LOSARTAN POTASSIUM-HCTZ 100-12.5 MG PO TABS: 1.0000 | ORAL_TABLET | Freq: Every day | ORAL | 2 refills | Status: DC

## 2017-05-23 NOTE — Addendum Note (Signed)
Addended byDamita Dunnings D on: 05/23/2017 07:48 AM   Modules accepted: Orders

## 2017-06-22 ENCOUNTER — Other Ambulatory Visit: Payer: Self-pay | Admitting: Internal Medicine

## 2017-07-04 DIAGNOSIS — M170 Bilateral primary osteoarthritis of knee: Secondary | ICD-10-CM | POA: Diagnosis not present

## 2017-07-04 DIAGNOSIS — M1711 Unilateral primary osteoarthritis, right knee: Secondary | ICD-10-CM | POA: Diagnosis not present

## 2017-07-04 DIAGNOSIS — M25562 Pain in left knee: Secondary | ICD-10-CM | POA: Diagnosis not present

## 2017-07-04 DIAGNOSIS — M25561 Pain in right knee: Secondary | ICD-10-CM | POA: Diagnosis not present

## 2017-07-06 DIAGNOSIS — M25562 Pain in left knee: Secondary | ICD-10-CM | POA: Diagnosis not present

## 2017-07-06 DIAGNOSIS — M1712 Unilateral primary osteoarthritis, left knee: Secondary | ICD-10-CM | POA: Diagnosis not present

## 2017-07-11 DIAGNOSIS — M1711 Unilateral primary osteoarthritis, right knee: Secondary | ICD-10-CM | POA: Diagnosis not present

## 2017-07-11 DIAGNOSIS — M25561 Pain in right knee: Secondary | ICD-10-CM | POA: Diagnosis not present

## 2017-07-13 DIAGNOSIS — M25562 Pain in left knee: Secondary | ICD-10-CM | POA: Diagnosis not present

## 2017-07-13 DIAGNOSIS — M1712 Unilateral primary osteoarthritis, left knee: Secondary | ICD-10-CM | POA: Diagnosis not present

## 2017-07-18 DIAGNOSIS — M25561 Pain in right knee: Secondary | ICD-10-CM | POA: Diagnosis not present

## 2017-07-18 DIAGNOSIS — M1711 Unilateral primary osteoarthritis, right knee: Secondary | ICD-10-CM | POA: Diagnosis not present

## 2017-07-20 DIAGNOSIS — M25562 Pain in left knee: Secondary | ICD-10-CM | POA: Diagnosis not present

## 2017-07-20 DIAGNOSIS — M1711 Unilateral primary osteoarthritis, right knee: Secondary | ICD-10-CM | POA: Diagnosis not present

## 2017-07-20 DIAGNOSIS — M25561 Pain in right knee: Secondary | ICD-10-CM | POA: Diagnosis not present

## 2017-07-20 DIAGNOSIS — M1712 Unilateral primary osteoarthritis, left knee: Secondary | ICD-10-CM | POA: Diagnosis not present

## 2017-07-31 ENCOUNTER — Other Ambulatory Visit: Payer: Self-pay | Admitting: Gastroenterology

## 2017-08-10 ENCOUNTER — Other Ambulatory Visit: Payer: Self-pay | Admitting: Gastroenterology

## 2017-08-10 MED ORDER — RANITIDINE HCL 150 MG PO TABS: 150.0000 mg | ORAL_TABLET | Freq: Every day | ORAL | 3 refills | Status: DC

## 2017-08-10 MED ORDER — RANITIDINE HCL 150 MG PO TABS: 150.0000 mg | ORAL_TABLET | Freq: Every day | ORAL | 0 refills | Status: DC

## 2017-08-10 NOTE — Telephone Encounter (Signed)
Spoke to pts daughter and informed her Rx sent to pharmacy. She also requested a 90 day prescription be sent to her mail order pharmacy as well.

## 2017-08-16 ENCOUNTER — Other Ambulatory Visit: Payer: Self-pay | Admitting: Internal Medicine

## 2017-09-03 ENCOUNTER — Other Ambulatory Visit: Payer: Self-pay | Admitting: Gastroenterology

## 2017-09-26 ENCOUNTER — Other Ambulatory Visit: Payer: Self-pay

## 2017-09-26 MED ORDER — SITAGLIPTIN PHOSPHATE 50 MG PO TABS: 50.0000 mg | ORAL_TABLET | Freq: Every day | ORAL | 1 refills | Status: DC

## 2017-10-19 ENCOUNTER — Other Ambulatory Visit: Payer: Self-pay | Admitting: Internal Medicine

## 2017-10-24 DIAGNOSIS — M170 Bilateral primary osteoarthritis of knee: Secondary | ICD-10-CM | POA: Diagnosis not present

## 2017-10-24 DIAGNOSIS — M1712 Unilateral primary osteoarthritis, left knee: Secondary | ICD-10-CM | POA: Diagnosis not present

## 2017-10-24 DIAGNOSIS — M25561 Pain in right knee: Secondary | ICD-10-CM | POA: Diagnosis not present

## 2017-10-24 DIAGNOSIS — M25562 Pain in left knee: Secondary | ICD-10-CM | POA: Diagnosis not present

## 2017-10-31 DIAGNOSIS — M25561 Pain in right knee: Secondary | ICD-10-CM | POA: Diagnosis not present

## 2017-10-31 DIAGNOSIS — M170 Bilateral primary osteoarthritis of knee: Secondary | ICD-10-CM | POA: Diagnosis not present

## 2017-10-31 DIAGNOSIS — M1711 Unilateral primary osteoarthritis, right knee: Secondary | ICD-10-CM | POA: Diagnosis not present

## 2017-11-01 DIAGNOSIS — M25562 Pain in left knee: Secondary | ICD-10-CM | POA: Diagnosis not present

## 2017-11-01 DIAGNOSIS — M1712 Unilateral primary osteoarthritis, left knee: Secondary | ICD-10-CM | POA: Diagnosis not present

## 2017-11-08 DIAGNOSIS — M1711 Unilateral primary osteoarthritis, right knee: Secondary | ICD-10-CM | POA: Diagnosis not present

## 2017-11-08 DIAGNOSIS — M25561 Pain in right knee: Secondary | ICD-10-CM | POA: Diagnosis not present

## 2017-11-16 ENCOUNTER — Ambulatory Visit (INDEPENDENT_AMBULATORY_CARE_PROVIDER_SITE_OTHER): Payer: Medicare Other | Admitting: Internal Medicine

## 2017-11-16 ENCOUNTER — Encounter: Payer: Self-pay | Admitting: Internal Medicine

## 2017-11-16 VITALS — BP 138/54 | HR 64 | Temp 97.8°F | Resp 16 | Ht 67.0 in | Wt 209.1 lb

## 2017-11-16 DIAGNOSIS — I10 Essential (primary) hypertension: Secondary | ICD-10-CM | POA: Diagnosis not present

## 2017-11-16 DIAGNOSIS — E785 Hyperlipidemia, unspecified: Secondary | ICD-10-CM

## 2017-11-16 DIAGNOSIS — E119 Type 2 diabetes mellitus without complications: Secondary | ICD-10-CM

## 2017-11-16 LAB — BASIC METABOLIC PANEL
BUN: 25 mg/dL — ABNORMAL HIGH (ref 6–23)
CALCIUM: 8.9 mg/dL (ref 8.4–10.5)
CO2: 31 meq/L (ref 19–32)
CREATININE: 1.44 mg/dL — AB (ref 0.40–1.20)
Chloride: 104 mEq/L (ref 96–112)
GFR: 36.37 mL/min — AB (ref >60.00)
Glucose, Bld: 121 mg/dL — ABNORMAL HIGH (ref 70–99)
Potassium: 4.4 mEq/L (ref 3.5–5.1)
SODIUM: 141 meq/L (ref 135–145)

## 2017-11-16 LAB — LIPID PANEL
CHOLESTEROL: 120 mg/dL (ref 0–200)
HDL: 33.3 mg/dL — ABNORMAL LOW (ref >39.00)
LDL Cholesterol: 59 mg/dL (ref 0–99)
NONHDL: 87.05
Total CHOL/HDL Ratio: 4
Triglycerides: 138 mg/dL (ref 0.0–149.0)
VLDL: 27.6 mg/dL (ref 0.0–40.0)

## 2017-11-16 LAB — AST: AST: 31 U/L (ref 0–37)

## 2017-11-16 LAB — ALT: ALT: 18 U/L (ref 0–35)

## 2017-11-16 LAB — HEMOGLOBIN A1C: HEMOGLOBIN A1C: 6.4 % (ref 4.6–6.5)

## 2017-11-16 NOTE — Patient Instructions (Signed)
GO TO THE LAB : Get the blood work     GO TO THE FRONT DESK Schedule your next appointment for a physical exam  in 6 months   Consider [facility]

## 2017-11-16 NOTE — Progress Notes (Signed)
Pre visit review using our clinic review tool, if applicable. No additional management support is needed unless otherwise documented below in the visit note. 

## 2017-11-16 NOTE — Assessment & Plan Note (Addendum)
--  Td 2012; PNM 23:  2010, 05/2017; prevnar-- 2016; shingles shot: 2011; had a flu shot ; shingrix discussed  -- b(+) FH breast ca ( 2 sisters ); SBE wnl, MMG 9 (-) 04/2017 --Cervical ca screening:No further Paps --CCS Cscope 2004 : tics, hemorrhoids  colonoscopyt 08/2008 : tics, no polyps --  bone density test 10-2006 and 06-2009 normal, vitamin D normal 2011. No need to repeat a DEXA per literature

## 2017-11-16 NOTE — Progress Notes (Signed)
Subjective:    Patient ID: Rebecca Elliott, female    DOB: 05-28-1926, 84 y.o.   MRN: [id number]  DOS:  11/16/2017 Type of visit - description :  rov Interval history: DM, good med compliance, no ambulatory CBGs. HTN: Good med compliance, BPs when checked are normal. COPD asthma: Uses inhalers appropriately, essentially no symptoms, gets slightly short of breath if she walks fast    Review of Systems Denies nausea, vomiting, diarrhea.  No blood in the stools. No major problems with frequent cough or sputum production No chest pain No major problems with swelling.  Past Medical History:  Diagnosis Date  . Allergic rhinitis   . COPD  and ASTHMA   . Depression   . Diabetes mellitus    as an adult  . DJD (degenerative joint disease)   . GERD (gastroesophageal reflux disease)    and HH w/ reflux  . History of colonic polyps   . Hyperlipidemia   . Hypertension     Past Surgical History:  Procedure Laterality Date  . ABDOMINAL HYSTERECTOMY     apparently no oophorectomy  . APPENDECTOMY    . bilateral breast tumor    . BREAST EXCISIONAL BIOPSY Bilateral    one in her 20's and one in her 50's, both benign  . CATARACT EXTRACTION, BILATERAL    . CHOLECYSTECTOMY    . TONSILLECTOMY    . TUBAL LIGATION      Social History   Socioeconomic History  . Marital status: Widowed    Spouse name: Not on file  . Number of children: 5  . Years of education: Not on file  . Highest education level: Not on file  Occupational History  . Occupation: retired   Scientific laboratory technician  . Financial resource strain: Not on file  . Food insecurity:    Worry: Not on file    Inability: Not on file  . Transportation needs:    Medical: Not on file    Non-medical: Not on file  Tobacco Use  . Smoking status: Former Smoker    Packs/day: 1.00    Years: 35.00    Pack years: 35.00    Types: Cigarettes    Last attempt to quit: 01/18/1993    Years since quitting: 24.8  . Smokeless tobacco: Never Used    Substance and Sexual Activity  . Alcohol use: No    Alcohol/week: 0.0 standard drinks  . Drug use: No  . Sexual activity: Not on file  Lifestyle  . Physical activity:    Days per week: Not on file    Minutes per session: Not on file  . Stress: Not on file  Relationships  . Social connections:    Talks on phone: Not on file    Gets together: Not on file    Attends religious service: Not on file    Active member of club or organization: Not on file    Attends meetings of clubs or organizations: Not on file    Relationship status: Not on file  . Intimate partner violence:    Fear of current or ex partner: Not on file    Emotionally abused: Not on file    Physically abused: Not on file    Forced sexual activity: Not on file  Other Topics Concern  . Not on file  Social History Narrative   Household: pt and 2 daughters   widow (1994), children 5 (lost 3 sons,2 alive, daughters)   [name] 44 y/o  GGchildren x 2       Allergies as of 11/16/2017   No Known Allergies     Medication List        Accurate as of 11/16/17 11:59 PM. Always use your most recent med list.          albuterol 108 (90 Base) MCG/ACT inhaler Commonly known as:  PROVENTIL HFA;VENTOLIN HFA Inhale 2 puffs into the lungs every 6 (six) hours as needed for wheezing or shortness of breath.   amLODipine 10 MG tablet Commonly known as:  NORVASC Take 1 tablet (10 mg total) by mouth daily.   aspirin 81 MG tablet Take 81 mg by mouth daily.   atorvastatin 10 MG tablet Commonly known as:  LIPITOR Take 1 tablet (10 mg total) by mouth daily.   BENEFIBER DRINK MIX PO Take by mouth 2 (two) times daily.   CENTRUM SILVER Chew Chew by mouth.   dextromethorphan-guaiFENesin 30-600 MG 12hr tablet Commonly known as:  MUCINEX DM Take 1 tablet by mouth daily.   diclofenac sodium 1 % Gel Commonly known as:  VOLTAREN Apply 2 g topically 4 (four) times daily.   docusate sodium 100 MG capsule Commonly known as:   COLACE Take 1-2 by mouth once a day.   loratadine 10 MG tablet Commonly known as:  CLARITIN Take 10 mg by mouth daily.   losartan-hydrochlorothiazide 100-12.5 MG tablet Commonly known as:  HYZAAR Take 1 tablet by mouth daily.   metoprolol succinate 25 MG 24 hr tablet Commonly known as:  TOPROL-XL Take 1.5 tablets (37.5 mg total) by mouth daily.   pantoprazole 40 MG tablet Commonly known as:  PROTONIX Take 1 tablet (40 mg total) by mouth every other day.   ranitidine 150 MG tablet Commonly known as:  ZANTAC Take 1 tablet (150 mg total) by mouth at bedtime.   sitaGLIPtin 50 MG tablet Commonly known as:  JANUVIA Take 1 tablet (50 mg total) by mouth daily.   tiotropium 18 MCG inhalation capsule Commonly known as:  SPIRIVA Place 1 capsule (18 mcg total) into inhaler and inhale daily.          Objective:   Physical Exam BP (!) 138/54 (BP Location: Left Arm, Patient Position: Sitting, Cuff Size: Normal)   Pulse 64   Temp 97.8 F (36.6 C) (Oral)   Resp 16   Ht 5\' 7"  (1.702 m)   Wt 209 lb 2 oz (94.9 kg)   SpO2 96%   BMI 32.75 kg/m  General:   Well developed, NAD, see BMI.  HEENT:  Normocephalic . Face symmetric, atraumatic Lungs:  CTA B Normal respiratory effort, no intercostal retractions, no accessory muscle use. Heart: RRR,  no murmur.  No pretibial edema bilaterally  Skin: Not pale. Not jaundice Neurologic:  alert & oriented X3.  Speech normal, gait appropriate for age and unassisted Psych--  Cognition and judgment appear intact.  Cooperative with normal attention span and concentration.  Behavior appropriate. No anxious or depressed appearing.      Assessment & Plan:   Assessment  DM--no neuropathy, Metformin DC 04-2015 d/t creatinine HTN  Hyperlipidemia Depression COPD - Asthma Pulmonary nodules per CXR 03-2014, CT 03-2016, stable, no further CTs needed Anemia, chronic, on-off iron def  Colonoscopy 2004: No polyps EGD 2006 for dysphagia showed  hiatal hernia, occult  stricture? Colonoscopy 2010 showed diverticuli Dysphagia: Barium swallow showed dysmotility 08-2014 DJD GERD  PLAN: DM: Currently on Januvia.  Checking A1c HTN: Continue amlodipine, Hyzaar, Toprol.  Check a  BMP.  EKG today-NSR, no acute findings.  Patient is asymptomatic. Hyperlipidemia: On Lipitor, check a FLP, AST, ALT COPD asthma: Appropriate use of his Spiriva, uses albuterol as needed, essentially no symptoms. Preventive care discussed RTC 6 months

## 2017-11-17 NOTE — Assessment & Plan Note (Signed)
DM: Currently on Januvia.  Checking A1c HTN: Continue amlodipine, Hyzaar, Toprol.  Check a BMP.  EKG today-NSR, no acute findings.  Patient is asymptomatic. Hyperlipidemia: On Lipitor, check a FLP, AST, ALT COPD asthma: Appropriate use of his Spiriva, uses albuterol as needed, essentially no symptoms. Preventive care discussed RTC 6 months

## 2017-11-24 ENCOUNTER — Ambulatory Visit: Payer: Medicare Other | Admitting: Internal Medicine

## 2018-01-23 ENCOUNTER — Telehealth: Payer: Self-pay

## 2018-01-23 MED ORDER — FAMOTIDINE 20 MG PO TABS: 20.0000 mg | ORAL_TABLET | Freq: Two times a day (BID) | ORAL | 3 refills | Status: DC

## 2018-01-23 MED ORDER — ALBUTEROL SULFATE 108 (90 BASE) MCG/ACT IN AEPB: 2.0000 | INHALATION_SPRAY | Freq: Four times a day (QID) | RESPIRATORY_TRACT | 1 refills | Status: DC | PRN

## 2018-01-23 MED ORDER — METOPROLOL SUCCINATE ER 25 MG PO TB24: 37.5000 mg | ORAL_TABLET | Freq: Every day | ORAL | 1 refills | Status: DC

## 2018-01-23 MED ORDER — SITAGLIPTIN PHOSPHATE 50 MG PO TABS: 50.0000 mg | ORAL_TABLET | Freq: Every day | ORAL | 1 refills | Status: DC

## 2018-01-23 MED ORDER — PANTOPRAZOLE SODIUM 40 MG PO TBEC: 40.0000 mg | DELAYED_RELEASE_TABLET | ORAL | 3 refills | Status: DC

## 2018-01-23 MED ORDER — TIOTROPIUM BROMIDE MONOHYDRATE 18 MCG IN CAPS: 18.0000 ug | ORAL_CAPSULE | Freq: Every day | RESPIRATORY_TRACT | 1 refills | Status: DC

## 2018-01-23 MED ORDER — ATORVASTATIN CALCIUM 10 MG PO TABS: 10.0000 mg | ORAL_TABLET | Freq: Every day | ORAL | 1 refills | Status: DC

## 2018-01-23 MED ORDER — AMLODIPINE BESYLATE 10 MG PO TABS: 10.0000 mg | ORAL_TABLET | Freq: Every day | ORAL | 1 refills | Status: DC

## 2018-01-23 MED ORDER — LOSARTAN POTASSIUM-HCTZ 100-12.5 MG PO TABS: 1.0000 | ORAL_TABLET | Freq: Every day | ORAL | 1 refills | Status: DC

## 2018-01-23 NOTE — Telephone Encounter (Signed)
Rx sent to [facility]

## 2018-01-23 NOTE — Telephone Encounter (Signed)
Pt note dropped off from Pt- she is now using [facility] for her medications- she is needing prescriptions sent---> will do.   Pt is needing replacement for ranitidine due to recall.

## 2018-01-23 NOTE — Telephone Encounter (Signed)
Changed to famotidine 20 mg 1 p.o. twice daily

## 2018-01-24 ENCOUNTER — Telehealth: Payer: Self-pay | Admitting: Internal Medicine

## 2018-01-24 MED ORDER — UMECLIDINIUM BROMIDE 62.5 MCG/INH IN AEPB: 90.0000 | INHALATION_SPRAY | Freq: Every day | RESPIRATORY_TRACT | 3 refills | Status: DC

## 2018-01-24 NOTE — Telephone Encounter (Signed)
Please advise 

## 2018-01-24 NOTE — Telephone Encounter (Signed)
Copied from [place]. Topic: Quick Communication - Rx Refill/Question >> Jan 24, 2018 11:55 AM Virl Axe D wrote: Medication: tiotropium (SPIRIVA HANDIHALER) 18 MCG inhalation capsule / Pt's daughter stated that pt's new insurance will not cover Spiriva. Requesting rx change to Incruse Ellipta which insurance will cover. Please advise.  Has the patient contacted their pharmacy? Yes.   (Agent: If no, request that the patient contact the pharmacy for the refill.) (Agent: If yes, when and what did the pharmacy advise?)  Preferred Pharmacy (with phone number or street name): [place], [place] [phone] (Phone) [phone] (Fax)    Agent: Please be advised that RX refills may take up to 3 business days. We ask that you follow-up with your pharmacy.

## 2018-01-24 NOTE — Telephone Encounter (Signed)
Okay to switch to Incruse Ellipta 1 puff daily.

## 2018-01-24 NOTE — Telephone Encounter (Signed)
Rx sent 

## 2018-04-03 ENCOUNTER — Encounter: Payer: Self-pay | Admitting: Family

## 2018-04-03 ENCOUNTER — Ambulatory Visit: Payer: Medicare Other | Admitting: Internal Medicine

## 2018-04-03 ENCOUNTER — Other Ambulatory Visit: Payer: Self-pay

## 2018-04-03 ENCOUNTER — Ambulatory Visit: Payer: Medicare Other | Admitting: Family Medicine

## 2018-04-03 ENCOUNTER — Ambulatory Visit (INDEPENDENT_AMBULATORY_CARE_PROVIDER_SITE_OTHER): Payer: Medicare Other | Admitting: Family

## 2018-04-03 VITALS — BP 150/60 | HR 62 | Temp 98.1°F | Resp 16 | Ht 67.0 in | Wt 205.0 lb

## 2018-04-03 DIAGNOSIS — I83813 Varicose veins of bilateral lower extremities with pain: Secondary | ICD-10-CM | POA: Diagnosis not present

## 2018-04-03 NOTE — Patient Instructions (Signed)
You may use tylenol as needed for pain. Elevate your legs as much as possible. Wear compression stockings as much as possible during waking hours. Call if leg pain worsens or if these measure do not help and we can refer you to a vein specialist.

## 2018-04-03 NOTE — Progress Notes (Signed)
Subjective:    Patient ID: Rebecca Elliott, female    DOB: 09-14-1926, 85 y.o.   MRN: [id number]  HPI  Patient is a 85 yr old female who presents today with chief complaint of varicose veins. She reports bilateral aching in her legs.  Recently was wearing a knee brace on her right knee which she thinks worsened the varicose veins in her right leg.  Also notes some swelling.   Review of Systems    see HPI  Past Medical History:  Diagnosis Date   Allergic rhinitis    COPD  and ASTHMA    Depression    Diabetes mellitus    as an adult   DJD (degenerative joint disease)    GERD (gastroesophageal reflux disease)    and HH w/ reflux   History of colonic polyps    Hyperlipidemia    Hypertension      Social History   Socioeconomic History   Marital status: Widowed    Spouse name: Not on file   Number of children: 5   Years of education: Not on file   Highest education level: Not on file  Occupational History   Occupation: retired   Scientist, product/process development strain: Not on file   Food insecurity:    Worry: Not on file    Inability: Not on file   Transportation needs:    Medical: Not on file    Non-medical: Not on file  Tobacco Use   Smoking status: Former Smoker    Packs/day: 1.00    Years: 35.00    Pack years: 35.00    Types: Cigarettes    Last attempt to quit: 01/18/1993    Years since quitting: 25.2   Smokeless tobacco: Never Used  Substance and Sexual Activity   Alcohol use: No    Alcohol/week: 0.0 standard drinks   Drug use: No   Sexual activity: Not on file  Lifestyle   Physical activity:    Days per week: Not on file    Minutes per session: Not on file   Stress: Not on file  Relationships   Social connections:    Talks on phone: Not on file    Gets together: Not on file    Attends religious service: Not on file    Active member of club or organization: Not on file    Attends meetings of clubs or organizations: Not on  file    Relationship status: Not on file   Intimate partner violence:    Fear of current or ex partner: Not on file    Emotionally abused: Not on file    Physically abused: Not on file    Forced sexual activity: Not on file  Other Topics Concern   Not on file  Social History Narrative   Household: pt and 2 daughters   widow (1994), children 5 (lost 3 sons,2 alive, daughters)   [name] 13 y/o   GGchildren x 2     Past Surgical History:  Procedure Laterality Date   ABDOMINAL HYSTERECTOMY     apparently no oophorectomy   APPENDECTOMY     bilateral breast tumor     BREAST EXCISIONAL BIOPSY Bilateral    one in her 48's and one in her 46's, both benign   CATARACT EXTRACTION, BILATERAL     CHOLECYSTECTOMY     TONSILLECTOMY     TUBAL LIGATION      Family History  Problem Relation Age of Onset  Breast cancer Sister        x 2   Diabetes Father    Diabetes Sister    Heart attack Brother        early 67s   Heart attack Son    Pancreatic cancer Son    Stomach cancer Sister    Colon cancer Neg Hx     No Known Allergies  Current Outpatient Medications on File Prior to Visit  Medication Sig Dispense Refill   Albuterol Sulfate (PROAIR RESPICLICK) 025 (90 Base) MCG/ACT AEPB Inhale 2 puffs into the lungs every 6 (six) hours as needed. 90 each 1   amLODipine (NORVASC) 10 MG tablet Take 1 tablet (10 mg total) by mouth daily. 90 tablet 1   aspirin 81 MG tablet Take 81 mg by mouth daily.      atorvastatin (LIPITOR) 10 MG tablet Take 1 tablet (10 mg total) by mouth daily. 90 tablet 1   dextromethorphan-guaiFENesin (MUCINEX DM) 30-600 MG per 12 hr tablet Take 1 tablet by mouth daily.       diclofenac sodium (VOLTAREN) 1 % GEL Apply 2 g topically 4 (four) times daily. 1 Tube 5   docusate sodium (COLACE) 100 MG capsule Take 1-2 by mouth once a day.      famotidine (PEPCID) 20 MG tablet Take 1 tablet (20 mg total) by mouth 2 (two) times daily. 180 tablet 3    loratadine (CLARITIN) 10 MG tablet Take 10 mg by mouth daily.       losartan-hydrochlorothiazide (HYZAAR) 100-12.5 MG tablet Take 1 tablet by mouth daily. 90 tablet 1   metoprolol succinate (TOPROL-XL) 25 MG 24 hr tablet Take 1.5 tablets (37.5 mg total) by mouth daily. 135 tablet 1   Multiple Vitamins-Minerals (CENTRUM SILVER) CHEW Chew by mouth.       pantoprazole (PROTONIX) 40 MG tablet Take 1 tablet (40 mg total) by mouth every other day. 45 tablet 3   sitaGLIPtin (JANUVIA) 50 MG tablet Take 1 tablet (50 mg total) by mouth daily. 90 tablet 1   umeclidinium bromide (INCRUSE ELLIPTA) 62.5 MCG/INH AEPB Inhale 90 puffs into the lungs daily at 12 noon. 90 each 3   Wheat Dextrin (BENEFIBER DRINK MIX PO) Take by mouth 2 (two) times daily.       No current facility-administered medications on file prior to visit.     BP (!) 150/60 (BP Location: Left Arm, Patient Position: Sitting, Cuff Size: Small)    Pulse 62    Temp 98.1 F (36.7 C) (Oral)    Resp 16    Ht 5\' 7"  (1.702 m)    Wt 205 lb (93 kg)    SpO2 97%    BMI 32.11 kg/m    Objective:   Physical Exam Constitutional:      Appearance: Normal appearance.  HENT:     Head: Normocephalic and atraumatic.  Cardiovascular:     Comments: Tortuous varicose veins noted in the right lower leg.  Some varicose veins and spider veins bilaterally  Musculoskeletal:     Right lower leg: 1+ Edema present.     Left lower leg: 1+ Edema present.  Neurological:     Mental Status: She is alert.           Assessment & Plan:  Varicose veins- advised the following: You may use tylenol as needed for pain. Elevate your legs as much as possible. Wear compression stockings as much as possible during waking hours. (rx provided for knee high compression hose  20-30 mmHg). Call if leg pain worsens or if these measure do not help and we can refer you to a vein specialist.

## 2018-05-19 ENCOUNTER — Ambulatory Visit: Payer: Medicare Other | Admitting: Internal Medicine

## 2018-07-06 ENCOUNTER — Other Ambulatory Visit: Payer: Self-pay | Admitting: Internal Medicine

## 2018-07-27 ENCOUNTER — Ambulatory Visit (INDEPENDENT_AMBULATORY_CARE_PROVIDER_SITE_OTHER): Payer: Medicare Other | Admitting: Internal Medicine

## 2018-07-27 ENCOUNTER — Other Ambulatory Visit: Payer: Self-pay

## 2018-07-27 DIAGNOSIS — M1711 Unilateral primary osteoarthritis, right knee: Secondary | ICD-10-CM | POA: Diagnosis not present

## 2018-07-27 DIAGNOSIS — K644 Residual hemorrhoidal skin tags: Secondary | ICD-10-CM

## 2018-07-27 DIAGNOSIS — E785 Hyperlipidemia, unspecified: Secondary | ICD-10-CM

## 2018-07-27 DIAGNOSIS — I83813 Varicose veins of bilateral lower extremities with pain: Secondary | ICD-10-CM | POA: Diagnosis not present

## 2018-07-27 DIAGNOSIS — E119 Type 2 diabetes mellitus without complications: Secondary | ICD-10-CM

## 2018-07-27 MED ORDER — HYDROCORTISONE 2.5 % EX CREA: TOPICAL_CREAM | Freq: Two times a day (BID) | CUTANEOUS | 3 refills | Status: AC

## 2018-07-27 MED ORDER — LIDOCAINE 3 % EX CREA: 1.0000 "application " | TOPICAL_CREAM | Freq: Three times a day (TID) | CUTANEOUS | 3 refills | Status: DC | PRN

## 2018-07-27 MED ORDER — DICLOFENAC SODIUM 1 % TD GEL: 2.0000 g | Freq: Four times a day (QID) | TRANSDERMAL | 1 refills | Status: AC

## 2018-07-27 NOTE — Patient Instructions (Addendum)
It was nice to talk to you today  For the varicose veins: Please use your compression stockings daily, put them on early in the morning  For knee pain: Use ice as needed Tylenol as needed by mouth Stop Biofreeze and instead use diclofenac gel 3 or 4 times a day as needed, I just sent a prescription  For hemorrhoids, when you have pain you can use 2 creams: Lidocaine 3%: A small amount on top of the hemorrhoids 3-4 times a day as needed Hydrocortisone 2.5%: Small amount on top of the hemorrhoid twice a day as needed  We will call you and set an appointment in 3 months from today  Please get your flu shot this fall.

## 2018-07-27 NOTE — Progress Notes (Signed)
Subjective:    Patient ID: Rebecca Elliott, female    DOB: 1926-04-14, 85 y.o.   MRN: [id number]  DOS:  07/27/2018 Type of visit - description: Attempted  to make this a video visit, due to technical difficulties from the patient side it was not possible  thus we proceeded with a Virtual Visit via Telephone    I connected with@ on 07/27/18 at  9:00 AM EDT by telephone and verified that I am speaking with the correct person using two identifiers.  THIS ENCOUNTER IS A VIRTUAL VISIT DUE TO COVID-19 - PATIENT WAS NOT SEEN IN THE OFFICE. PATIENT HAS CONSENTED TO VIRTUAL VISIT / TELEMEDICINE VISIT   Location of patient: home  Location of provider: office  I discussed the limitations, risks, security and privacy concerns of performing an evaluation and management service by telephone and the availability of in person appointments. I also discussed with the patient that there may be a patient responsible charge related to this service. The patient expressed understanding and agreed to proceed.   History of Present Illness: ROV  I talked today with the patient on her [name]. We went over her labs and medications. We also discussed several concerns: Has varicose veins, when she is up and around  for too long they start hurting. Denies any bleeding, redness or warmness.  Has advanced DJD, most of the pain is at the right knee, using Biofreeze.  Wonders if something else can be done.  Long history of hemorrhoids, OTC creams are not helping, they do hurt from time to time but there is no bleeding.  HTN: No ambulatory BPs GERD: Symptoms controlled    Review of Systems See above  Past Medical History:  Diagnosis Date  . Allergic rhinitis   . COPD  and ASTHMA   . Depression   . Diabetes mellitus    as an adult  . DJD (degenerative joint disease)   . GERD (gastroesophageal reflux disease)    and HH w/ reflux  . History of colonic polyps   . Hyperlipidemia   . Hypertension     Past Surgical History:  Procedure Laterality Date  . ABDOMINAL HYSTERECTOMY     apparently no oophorectomy  . APPENDECTOMY    . bilateral breast tumor    . BREAST EXCISIONAL BIOPSY Bilateral    one in her 20's and one in her 50's, both benign  . CATARACT EXTRACTION, BILATERAL    . CHOLECYSTECTOMY    . TONSILLECTOMY    . TUBAL LIGATION      Social History   Socioeconomic History  . Marital status: Widowed    Spouse name: Not on file  . Number of children: 5  . Years of education: Not on file  . Highest education level: Not on file  Occupational History  . Occupation: retired   Scientific laboratory technician  . Financial resource strain: Not on file  . Food insecurity    Worry: Not on file    Inability: Not on file  . Transportation needs    Medical: Not on file    Non-medical: Not on file  Tobacco Use  . Smoking status: Former Smoker    Packs/day: 1.00    Years: 35.00    Pack years: 35.00    Types: Cigarettes    Quit date: 01/18/1993    Years since quitting: 25.5  . Smokeless tobacco: Never Used  Substance and Sexual Activity  . Alcohol use: No    Alcohol/week: 0.0 standard  drinks  . Drug use: No  . Sexual activity: Not on file  Lifestyle  . Physical activity    Days per week: Not on file    Minutes per session: Not on file  . Stress: Not on file  Relationships  . Social Herbalist on phone: Not on file    Gets together: Not on file    Attends religious service: Not on file    Active member of club or organization: Not on file    Attends meetings of clubs or organizations: Not on file    Relationship status: Not on file  . Intimate partner violence    Fear of current or ex partner: Not on file    Emotionally abused: Not on file    Physically abused: Not on file    Forced sexual activity: Not on file  Other Topics Concern  . Not on file  Social History Narrative   Household: pt and 2 daughters   widow (1994), children 5 (lost 3 sons,2 alive, daughters)   [name]  37 y/o   GGchildren x 2       Allergies as of 07/27/2018   No Known Allergies     Medication List       Accurate as of July 27, 2018 12:09 PM. If you have any questions, ask your nurse or doctor.        Albuterol Sulfate 108 (90 Base) MCG/ACT Aepb Commonly known as: ProAir RespiClick Inhale 2 puffs into the lungs every 6 (six) hours as needed.   amLODipine 10 MG tablet Commonly known as: NORVASC Take 1 tablet (10 mg total) by mouth daily.   aspirin 81 MG tablet Take 81 mg by mouth daily.   atorvastatin 10 MG tablet Commonly known as: LIPITOR Take 1 tablet (10 mg total) by mouth daily.   BENEFIBER DRINK MIX PO Take by mouth 2 (two) times daily.   Centrum Silver Chew Chew by mouth.   dextromethorphan-guaiFENesin 30-600 MG 12hr tablet Commonly known as: MUCINEX DM Take 1 tablet by mouth daily.   diclofenac sodium 1 % Gel Commonly known as: VOLTAREN Apply 2 g topically 4 (four) times daily.   docusate sodium 100 MG capsule Commonly known as: COLACE Take 1-2 by mouth once a day.   famotidine 20 MG tablet Commonly known as: Pepcid Take 1 tablet (20 mg total) by mouth 2 (two) times daily.   loratadine 10 MG tablet Commonly known as: CLARITIN Take 10 mg by mouth daily.   losartan-hydrochlorothiazide 100-12.5 MG tablet Commonly known as: HYZAAR Take 1 tablet by mouth daily.   metoprolol succinate 25 MG 24 hr tablet Commonly known as: TOPROL-XL Take 1.5 tablets (37.5 mg total) by mouth daily.   pantoprazole 40 MG tablet Commonly known as: PROTONIX Take 1 tablet (40 mg total) by mouth every other day.   sitaGLIPtin 50 MG tablet Commonly known as: Januvia Take 1 tablet (50 mg total) by mouth daily.   umeclidinium bromide 62.5 MCG/INH Aepb Commonly known as: Incruse Ellipta Inhale 90 puffs into the lungs daily at 12 noon.           Objective:   Physical Exam There were no vitals taken for this visit. This is a virtual visit, via telephone, I spoke  with the daughter and the patient.  The patient was alert oriented x3, no apparent distress    Assessment     Assessment  DM--no neuropathy, Metformin DC 04-2015 d/t creatinine HTN  Hyperlipidemia  Depression COPD - Asthma Pulmonary nodules per CXR 03-2014, CT 03-2016, stable, no further CTs needed Anemia, chronic, on-off iron def  Colonoscopy 2004: No polyps EGD 2006 for dysphagia showed hiatal hernia, occult  stricture? Colonoscopy 2010 showed diverticuli Dysphagia: Barium swallow showed dysmotility 08-2014 DJD GERD   PLAN: DM: Currently on Januvia.  Last A1c satisfactory HTN: Currently on Hyzaar, Toprol, amlodipine.  No recent ambulatory BPs. Hyperlipidemia: On atorvastatin. COPD asthma: No recent symptoms, good compliance with medications. Varicose veins: As described above, she was previously recommended to get compression stockings but she did not like to go to a store to be fitted (fear of Covid).  Recommend to use OTC compression stockings that the daughter can get for her. Hemorrhoids: Long history of, occasional pain. Plan: Lidocaine and hydrocortisone 2.5 as needed, prescription sent DJD, right knee: Currently on Biofreeze and Tylenol, not as well-controlled as she would like to. Recommend to change Biofreeze to Voltaren gel, continue Tylenol, ice as needed. Labs:  Pt strongly declined need to come for blood work.  I understand.  They are concerned about coronavirus We will print her AVS and mail it to her with clear instructions.  Advise the daughter, [name], to sign for MyChart on behalf of her mother RTC virtually in 3 months  I discussed the assessment and treatment plan with the patient. The patient was provided an opportunity to ask questions and all were answered. The patient agreed with the plan and demonstrated an understanding of the instructions.   The patient was advised to call back or seek an in-person evaluation if the symptoms worsen or if the condition fails  to improve as anticipated.  I provided 22 minutes of non-face-to-face time during this encounter.  Kathlene November, MD       DM: Currently on Januvia.  Checking A1c HTN: Continue amlodipine, Hyzaar, Toprol.  Check a BMP.  EKG today-NSR, no acute findings.  Patient is asymptomatic. Hyperlipidemia: On Lipitor, check a FLP, AST, ALT COPD asthma: Appropriate use of his Spiriva, uses albuterol as needed, essentially no symptoms. Preventive care discussed RTC 6 months

## 2018-07-28 NOTE — Assessment & Plan Note (Signed)
DM: Currently on Januvia.  Last A1c satisfactory HTN: Currently on Hyzaar, Toprol, amlodipine.  No recent ambulatory BPs. Hyperlipidemia: On atorvastatin. COPD asthma: No recent symptoms, good compliance with medications. Varicose veins: As described above, she was previously recommended to get compression stockings but she did not like to go to a store to be fitted (fear of Covid).  Recommend to use OTC compression stockings that the daughter can get for her. Hemorrhoids: Long history of, occasional pain. Plan: Lidocaine and hydrocortisone 2.5 as needed, prescription sent DJD, right knee: Currently on Biofreeze and Tylenol, not as well-controlled as she would like to. Recommend to change Biofreeze to Voltaren gel, continue Tylenol, ice as needed. Labs:  Pt strongly declined need to come for blood work.  I understand.  They are concerned about coronavirus We will print her AVS and mail it to her with clear instructions.  Advise the daughter, [name], to sign for MyChart on behalf of her mother RTC virtually in 3 months

## 2018-08-01 ENCOUNTER — Ambulatory Visit (HOSPITAL_BASED_OUTPATIENT_CLINIC_OR_DEPARTMENT_OTHER)
Admission: RE | Admit: 2018-08-01 | Discharge: 2018-08-01 | Disposition: A | Discharge status: Home / Self Care | Payer: Medicare Other | Source: Ambulatory Visit | Attending: Internal Medicine | Admitting: Internal Medicine

## 2018-08-01 ENCOUNTER — Ambulatory Visit (INDEPENDENT_AMBULATORY_CARE_PROVIDER_SITE_OTHER): Payer: Medicare Other | Admitting: Internal Medicine

## 2018-08-01 ENCOUNTER — Ambulatory Visit: Payer: Self-pay | Admitting: *Deleted

## 2018-08-01 ENCOUNTER — Other Ambulatory Visit: Payer: Self-pay

## 2018-08-01 DIAGNOSIS — S6292XA Unspecified fracture of left wrist and hand, initial encounter for closed fracture: Secondary | ICD-10-CM

## 2018-08-01 DIAGNOSIS — M79642 Pain in left hand: Secondary | ICD-10-CM | POA: Diagnosis not present

## 2018-08-01 DIAGNOSIS — W19XXXA Unspecified fall, initial encounter: Secondary | ICD-10-CM | POA: Insufficient documentation

## 2018-08-01 DIAGNOSIS — S62647A Nondisplaced fracture of proximal phalanx of left little finger, initial encounter for closed fracture: Secondary | ICD-10-CM | POA: Diagnosis not present

## 2018-08-01 NOTE — Telephone Encounter (Signed)
  Call from daughter regarding mother's injury to her hand. Patient had a fall and has injury to her hand and fifth digit with bruising and swelling present. Patient is able to open and closed the hand- but does have some pain with it.  Daughter is concerned about bring mother to the office with COVID and would like to do virtual visit if possible. Per PCP- ok to start with that. Reason for Disposition . Large swelling or bruise  Answer Assessment - Initial Assessment Questions 1. MECHANISM: "How did the injury happen?"      Patient fell sideways onto bed and twisted hand and fingers 2. ONSET: "When did the injury happen?" (Minutes or hours ago)      Last night 3. LOCATION: "What part of the finger is injured?" "Is the nail damaged?"      Left hand-fifth digit and side hand 4. APPEARANCE of the INJURY: "What does the injury look like?"      Swollen and bruised below the knuckle hand is swollen and bruised over to ring finger 5. SEVERITY: "Can you use the hand normally?"  "Can you bend your fingers into a ball and then fully open them?"     Mostly- able to move hand- but painful 6. SIZE: For cuts, bruises, or swelling, ask: "How large is it?" (e.g., inches or centimeters;  entire finger)      Bruises are present, swelling- not doubled in size- 1/8-1/4 inch swollen, swelling just to the knuckle  7. PAIN: "Is there pain?" If so, ask: "How bad is the pain?"    (e.g., Scale 1-10; or mild, moderate, severe)     Yes- 3- patient will make a face if she moves it wrong 8. TETANUS: For any breaks in the skin, ask: "When was the last tetanus booster?"     2012 9. OTHER SYMPTOMS: "Do you have any other symptoms?"     Sore in general 10. PREGNANCY: "Is there any chance you are pregnant?" "When was your last menstrual period?"       n/a  Protocols used: FINGER INJURY-A-AH

## 2018-08-01 NOTE — Telephone Encounter (Signed)
Appt scheduled

## 2018-08-01 NOTE — Progress Notes (Signed)
Subjective:    Patient ID: Rebecca Elliott, female    DOB: June 30, 1926, 85 y.o.   MRN: [id number]  DOS:  08/01/2018 Type of visit - description: Virtual Visit via Video Note  I connected with@ on 08/01/18 at 11:20 AM EDT by a video enabled telemedicine application and verified that I am speaking with the correct person using two identifiers.   THIS ENCOUNTER IS A VIRTUAL VISIT DUE TO COVID-19 - PATIENT WAS NOT SEEN IN THE OFFICE. PATIENT HAS CONSENTED TO VIRTUAL VISIT / TELEMEDICINE VISIT   Location of patient: home  Location of provider: office  I discussed the limitations of evaluation and management by telemedicine and the availability of in person appointments. The patient expressed understanding and agreed to proceed.  History of Present Illness: Acute visit. Patient is seen with the assistance of her [name]. Last night, at home, lost her balance and fell. Fortunately fell on her bed, no major injury but she bended her fifth left finger. Denies any numbness at the distal fifth finger.    Review of Systems Denies LOC No head injury No neck or hip pain  Past Medical History:  Diagnosis Date  . Allergic rhinitis   . COPD  and ASTHMA   . Depression   . Diabetes mellitus    as an adult  . DJD (degenerative joint disease)   . GERD (gastroesophageal reflux disease)    and HH w/ reflux  . History of colonic polyps   . Hyperlipidemia   . Hypertension     Past Surgical History:  Procedure Laterality Date  . ABDOMINAL HYSTERECTOMY     apparently no oophorectomy  . APPENDECTOMY    . bilateral breast tumor    . BREAST EXCISIONAL BIOPSY Bilateral    one in her 20's and one in her 50's, both benign  . CATARACT EXTRACTION, BILATERAL    . CHOLECYSTECTOMY    . TONSILLECTOMY    . TUBAL LIGATION      Social History   Socioeconomic History  . Marital status: Widowed    Spouse name: Not on file  . Number of children: 5  . Years of education: Not on file  .  Highest education level: Not on file  Occupational History  . Occupation: retired   Scientific laboratory technician  . Financial resource strain: Not on file  . Food insecurity    Worry: Not on file    Inability: Not on file  . Transportation needs    Medical: Not on file    Non-medical: Not on file  Tobacco Use  . Smoking status: Former Smoker    Packs/day: 1.00    Years: 35.00    Pack years: 35.00    Types: Cigarettes    Quit date: 01/18/1993    Years since quitting: 25.5  . Smokeless tobacco: Never Used  Substance and Sexual Activity  . Alcohol use: No    Alcohol/week: 0.0 standard drinks  . Drug use: No  . Sexual activity: Not on file  Lifestyle  . Physical activity    Days per week: Not on file    Minutes per session: Not on file  . Stress: Not on file  Relationships  . Social Herbalist on phone: Not on file    Gets together: Not on file    Attends religious service: Not on file    Active member of club or organization: Not on file    Attends meetings of clubs or organizations:  Not on file    Relationship status: Not on file  . Intimate partner violence    Fear of current or ex partner: Not on file    Emotionally abused: Not on file    Physically abused: Not on file    Forced sexual activity: Not on file  Other Topics Concern  . Not on file  Social History Narrative   Household: pt and 2 daughters   widow (1994), children 5 (lost 3 sons,2 alive, daughters)   [name] 15 y/o   GGchildren x 2       Allergies as of 08/01/2018   No Known Allergies     Medication List       Accurate as of August 01, 2018 11:59 AM. If you have any questions, ask your nurse or doctor.        Albuterol Sulfate 108 (90 Base) MCG/ACT Aepb Commonly known as: ProAir RespiClick Inhale 2 puffs into the lungs every 6 (six) hours as needed.   amLODipine 10 MG tablet Commonly known as: NORVASC Take 1 tablet (10 mg total) by mouth daily.   aspirin 81 MG tablet Take 81 mg by mouth daily.    atorvastatin 10 MG tablet Commonly known as: LIPITOR Take 1 tablet (10 mg total) by mouth daily.   BENEFIBER DRINK MIX PO Take by mouth 2 (two) times daily.   Centrum Silver Chew Chew by mouth.   dextromethorphan-guaiFENesin 30-600 MG 12hr tablet Commonly known as: MUCINEX DM Take 1 tablet by mouth daily.   diclofenac sodium 1 % Gel Commonly known as: VOLTAREN Apply 2 g topically 4 (four) times daily.   docusate sodium 100 MG capsule Commonly known as: COLACE Take 1-2 by mouth once a day.   famotidine 20 MG tablet Commonly known as: Pepcid Take 1 tablet (20 mg total) by mouth 2 (two) times daily.   hydrocortisone 2.5 % cream Apply topically 2 (two) times daily.   Lidocaine 3 % Crea Apply 1 application topically 3 (three) times daily as needed.   loratadine 10 MG tablet Commonly known as: CLARITIN Take 10 mg by mouth daily.   losartan-hydrochlorothiazide 100-12.5 MG tablet Commonly known as: HYZAAR Take 1 tablet by mouth daily.   metoprolol succinate 25 MG 24 hr tablet Commonly known as: TOPROL-XL Take 1.5 tablets (37.5 mg total) by mouth daily.   pantoprazole 40 MG tablet Commonly known as: PROTONIX Take 1 tablet (40 mg total) by mouth every other day.   sitaGLIPtin 50 MG tablet Commonly known as: Januvia Take 1 tablet (50 mg total) by mouth daily.   umeclidinium bromide 62.5 MCG/INH Aepb Commonly known as: Incruse Ellipta Inhale 90 puffs into the lungs daily at 12 noon.           Objective:   Physical Exam There were no vitals taken for this visit. This is a virtual video visit, she is alert oriented x3. Right hand is normal Left hand: Normal except for the fifth finger, she cannot bend it all the way, she has discoloration and possibly hematoma at the base of the fifth finger. Both wrists seem unaffected.     Assessment      Assessment  DM--no neuropathy, Metformin DC 04-2015 d/t creatinine HTN  Hyperlipidemia Depression COPD - Asthma  Pulmonary nodules per CXR 03-2014, CT 03-2016, stable, no further CTs needed Anemia, chronic, on-off iron def  Colonoscopy 2004: No polyps EGD 2006 for dysphagia showed hiatal hernia, occult  stricture? Colonoscopy 2010 showed diverticuli Dysphagia: Barium swallow showed  dysmotility 08-2014 DJD GERD   PLAN: Injury, left hand: The patient injured the fifth left finger yesterday, she is unable to bend it all the way.  She has a bruise or hematoma at the base of the finger but no distal numbness.  No other apparent injury. We will get a x-ray, if no fracture, will buddy tape the finger with the fourth one, elevate the hand, ice the area and wait for few days.  If no better will need a referral. If she has a fracture, will be referred to Ortho They are aware of the limitations of a video evaluation. Addendum: X-ray showed a fracture, additional time was spent discussing the results with the daughter  Total time spent more than 25 min  I discussed the assessment and treatment plan with the patient. The patient was provided an opportunity to ask questions and all were answered. The patient agreed with the plan and demonstrated an understanding of the instructions.   The patient was advised to call back or seek an in-person evaluation if the symptoms worsen or if the condition fails to improve as anticipated.

## 2018-08-03 NOTE — Assessment & Plan Note (Signed)
Injury, left hand: The patient injured the fifth left finger yesterday, she is unable to bend it all the way.  She has a bruise or hematoma at the base of the finger but no distal numbness.  No other apparent injury. We will get a x-ray, if no fracture, will buddy tape the finger with the fourth one, elevate the hand, ice the area and wait for few days.  If no better will need a referral. If she has a fracture, will be referred to Ortho They are aware of the limitations of a video evaluation. Addendum: X-ray showed a fracture, additional time was spent discussing the results with the daughter

## 2018-08-07 DIAGNOSIS — S62647A Nondisplaced fracture of proximal phalanx of left little finger, initial encounter for closed fracture: Secondary | ICD-10-CM | POA: Diagnosis not present

## 2018-08-07 DIAGNOSIS — M79645 Pain in left finger(s): Secondary | ICD-10-CM | POA: Diagnosis not present

## 2018-08-15 DIAGNOSIS — M79645 Pain in left finger(s): Secondary | ICD-10-CM | POA: Diagnosis not present

## 2018-09-29 ENCOUNTER — Other Ambulatory Visit: Payer: Self-pay | Admitting: Internal Medicine

## 2018-10-24 ENCOUNTER — Ambulatory Visit (INDEPENDENT_AMBULATORY_CARE_PROVIDER_SITE_OTHER): Payer: Medicare Other | Admitting: Internal Medicine

## 2018-10-24 ENCOUNTER — Other Ambulatory Visit: Payer: Self-pay

## 2018-10-24 DIAGNOSIS — E119 Type 2 diabetes mellitus without complications: Secondary | ICD-10-CM | POA: Diagnosis not present

## 2018-10-24 DIAGNOSIS — E785 Hyperlipidemia, unspecified: Secondary | ICD-10-CM

## 2018-10-24 DIAGNOSIS — I10 Essential (primary) hypertension: Secondary | ICD-10-CM

## 2018-10-24 DIAGNOSIS — Z23 Encounter for immunization: Secondary | ICD-10-CM | POA: Diagnosis not present

## 2018-10-24 DIAGNOSIS — J449 Chronic obstructive pulmonary disease, unspecified: Secondary | ICD-10-CM

## 2018-10-24 NOTE — Progress Notes (Signed)
Subjective:    Patient ID: Rebecca Elliott, female    DOB: Jun 11, 1926, 85 y.o.   MRN: [id number]  DOS:  10/24/2018 Type of visit - description: Attempted  to make this a video visit, due to technical difficulties from the patient side it was not possible  thus we proceeded with a Virtual Visit via Telephone    I connected with@   by telephone and verified that I am speaking with the correct person using two identifiers.  THIS ENCOUNTER IS A VIRTUAL VISIT DUE TO COVID-19 - PATIENT WAS NOT SEEN IN THE OFFICE. PATIENT HAS CONSENTED TO VIRTUAL VISIT / TELEMEDICINE VISIT   Location of patient: home  Location of provider: office  I discussed the limitations, risks, security and privacy concerns of performing an evaluation and management service by telephone and the availability of in person appointments. I also discussed with the patient that there may be a patient responsible charge related to this service. The patient expressed understanding and agreed to proceed.   History of Present Illness: Follow-up Today I have a visit with Rebecca Elliott, she was with her daughter Rebecca Elliott. In general feeling well. Peyton has no major concerns Rebecca Elliott is concerned about knee pain  Review of Systems Denies nausea, vomiting, stomach pain. Good compliance with GI medicines still has occasional reflux and dysphasia. No falls recently Past Medical History:  Diagnosis Date  . Allergic rhinitis   . COPD  and ASTHMA   . Depression   . Diabetes mellitus    as an adult  . DJD (degenerative joint disease)   . GERD (gastroesophageal reflux disease)    and HH w/ reflux  . History of colonic polyps   . Hyperlipidemia   . Hypertension     Past Surgical History:  Procedure Laterality Date  . ABDOMINAL HYSTERECTOMY     apparently no oophorectomy  . APPENDECTOMY    . bilateral breast tumor    . BREAST EXCISIONAL BIOPSY Bilateral    one in her 20's and one in her 50's, both benign  . CATARACT EXTRACTION,  BILATERAL    . CHOLECYSTECTOMY    . TONSILLECTOMY    . TUBAL LIGATION      Social History   Socioeconomic History  . Marital status: Widowed    Spouse name: Not on file  . Number of children: 5  . Years of education: Not on file  . Highest education level: Not on file  Occupational History  . Occupation: retired   Scientific laboratory technician  . Financial resource strain: Not on file  . Food insecurity    Worry: Not on file    Inability: Not on file  . Transportation needs    Medical: Not on file    Non-medical: Not on file  Tobacco Use  . Smoking status: Former Smoker    Packs/day: 1.00    Years: 35.00    Pack years: 35.00    Types: Cigarettes    Quit date: 01/18/1993    Years since quitting: 25.7  . Smokeless tobacco: Never Used  Substance and Sexual Activity  . Alcohol use: No    Alcohol/week: 0.0 standard drinks  . Drug use: No  . Sexual activity: Not on file  Lifestyle  . Physical activity    Days per week: Not on file    Minutes per session: Not on file  . Stress: Not on file  Relationships  . Social connections    Talks on phone: Not on file  Gets together: Not on file    Attends religious service: Not on file    Active member of club or organization: Not on file    Attends meetings of clubs or organizations: Not on file    Relationship status: Not on file  . Intimate partner violence    Fear of current or ex partner: Not on file    Emotionally abused: Not on file    Physically abused: Not on file    Forced sexual activity: Not on file  Other Topics Concern  . Not on file  Social History Narrative   Household: pt and 2 daughters   widow (1994), children 5 (lost 3 sons,2 alive, daughters)   [name] 86 y/o   GGchildren x 2       Allergies as of 10/24/2018   No Known Allergies     Medication List       Accurate as of October 24, 2018 11:59 PM. If you have any questions, ask your nurse or doctor.        Albuterol Sulfate 108 (90 Base) MCG/ACT Aepb Commonly  known as: ProAir RespiClick Inhale 2 puffs into the lungs every 6 (six) hours as needed.   amLODipine 10 MG tablet Commonly known as: NORVASC TAKE 1 TABLET DAILY   aspirin 81 MG tablet Take 81 mg by mouth daily.   atorvastatin 10 MG tablet Commonly known as: LIPITOR Take 1 tablet (10 mg total) by mouth daily.   BENEFIBER DRINK MIX PO Take by mouth 2 (two) times daily.   Centrum Silver Chew Chew by mouth.   dextromethorphan-guaiFENesin 30-600 MG 12hr tablet Commonly known as: MUCINEX DM Take 1 tablet by mouth daily.   diclofenac sodium 1 % Gel Commonly known as: VOLTAREN Apply 2 g topically 4 (four) times daily.   docusate sodium 100 MG capsule Commonly known as: COLACE Take 1-2 by mouth once a day.   famotidine 20 MG tablet Commonly known as: Pepcid Take 1 tablet (20 mg total) by mouth 2 (two) times daily.   hydrocortisone 2.5 % cream Apply topically 2 (two) times daily.   Lidocaine 3 % Crea Apply 1 application topically 3 (three) times daily as needed.   loratadine 10 MG tablet Commonly known as: CLARITIN Take 10 mg by mouth daily.   losartan-hydrochlorothiazide 100-12.5 MG tablet Commonly known as: HYZAAR Take 1 tablet by mouth daily.   metoprolol succinate 25 MG 24 hr tablet Commonly known as: TOPROL-XL Take 1.5 tablets (37.5 mg total) by mouth daily.   pantoprazole 40 MG tablet Commonly known as: PROTONIX Take 1 tablet (40 mg total) by mouth every other day.   sitaGLIPtin 50 MG tablet Commonly known as: Januvia Take 1 tablet (50 mg total) by mouth daily.   umeclidinium bromide 62.5 MCG/INH Aepb Commonly known as: Incruse Ellipta Inhale 90 puffs into the lungs daily at 12 noon.           Objective:   Physical Exam There were no vitals taken for this visit. This is telephone virtual visit.  The patient sounded at baseline, alert oriented and in no distress    Assessment       Assessment  DM--no neuropathy, Metformin DC 04-2015 d/t  creatinine HTN  Hyperlipidemia Depression COPD - Asthma Pulmonary nodules per CXR 03-2014, CT 03-2016, stable, no further CTs needed Anemia, chronic, on-off iron def  Colonoscopy 2004: No polyps EGD 2006 for dysphagia showed hiatal hernia, occult  stricture? Colonoscopy 2010 showed diverticuli Dysphagia: Barium swallow showed  dysmotility 08-2014 DJD GERD   PLAN: DM: Currently on Januvia, no ambulatory CBGs.  Last A1c satisfactory. HTN: No ambulatory BPs, currently on amlodipine, metoprolol, Hyzaar. Hyperlipidemia: On Lipitor, last FLP satisfactory DJD: Continue with knee pain, we agreed on Tylenol 3 times a day, knee sleeve, ice at night as needed, OTC Voltaren gel which they have not been using.  If that fails, we could prescribe low-dose of a painkiller understanding the risk of dizziness and fall. Asthma COPD: Still uses albuterol regularly but overall stable Labs: The patient and the daughter were extremely hesitant to come to the office due to COVID-19, I understand her concerns, will regroup in 3 months. She will get flu shot at the pharmacy RTC 3 months     I discussed the assessment and treatment plan with the patient. The patient was provided an opportunity to ask questions and all were answered. The patient agreed with the plan and demonstrated an understanding of the instructions.   The patient was advised to call back or seek an in-person evaluation if the symptoms worsen or if the condition fails to improve as anticipated.  I provided 15 minutes of non-face-to-face time during this encounter.  Kathlene November, MD

## 2018-10-25 NOTE — Assessment & Plan Note (Signed)
DM: Currently on Januvia, no ambulatory CBGs.  Last A1c satisfactory. HTN: No ambulatory BPs, currently on amlodipine, metoprolol, Hyzaar. Hyperlipidemia: On Lipitor, last FLP satisfactory DJD: Continue with knee pain, we agreed on Tylenol 3 times a day, knee sleeve, ice at night as needed, OTC Voltaren gel which they have not been using.  If that fails, we could prescribe low-dose of a painkiller understanding the risk of dizziness and fall. Asthma COPD: Still uses albuterol regularly but overall stable Labs: The patient and the daughter were extremely hesitant to come to the office due to COVID-19, I understand her concerns, will regroup in 3 months. She will get flu shot at the pharmacy RTC 3 months

## 2018-12-29 ENCOUNTER — Encounter: Payer: Self-pay | Admitting: Internal Medicine

## 2018-12-29 DIAGNOSIS — R269 Unspecified abnormalities of gait and mobility: Secondary | ICD-10-CM

## 2019-01-02 ENCOUNTER — Other Ambulatory Visit: Payer: Self-pay | Admitting: Internal Medicine

## 2019-01-17 ENCOUNTER — Telehealth: Payer: Self-pay

## 2019-01-17 NOTE — Telephone Encounter (Signed)
Copied from [place] [id number]. Topic: General - Other >> Jan 17, 2019 12:08 PM Leward Quan A wrote: Reason for CRM: Patient [name] called to say that patient insurance denied her visit for 10/24/2018 saying that the billing code was incorrect. Asking to please correct and resubmit bill so they are not stuck paying out of pocket for this bill. Any further questions please call [name] at Ph# [phone]

## 2019-03-11 ENCOUNTER — Other Ambulatory Visit: Payer: Self-pay | Admitting: Internal Medicine

## 2019-04-02 ENCOUNTER — Other Ambulatory Visit: Payer: Self-pay

## 2019-04-02 ENCOUNTER — Other Ambulatory Visit: Payer: Self-pay | Admitting: Internal Medicine

## 2019-04-02 MED ORDER — PROAIR RESPICLICK 108 (90 BASE) MCG/ACT IN AEPB: 2.0000 | INHALATION_SPRAY | Freq: Four times a day (QID) | RESPIRATORY_TRACT | 3 refills | Status: DC | PRN

## 2019-04-10 ENCOUNTER — Telehealth: Payer: Self-pay | Admitting: Internal Medicine

## 2019-04-10 MED ORDER — ALBUTEROL SULFATE HFA 108 (90 BASE) MCG/ACT IN AERS: 2.0000 | INHALATION_SPRAY | Freq: Four times a day (QID) | RESPIRATORY_TRACT | 0 refills | Status: DC | PRN

## 2019-04-10 NOTE — Telephone Encounter (Signed)
Albuterol Sulfate (PROAIR RESPICLICK) 160 (90 Base) MCG/ACT AEPB    Albuterol HFA INHALER  Is covered and pharm wants to know if it can be switched for patient   [place] 9-5 Easter time  Pls use reference # when calling back [id number]

## 2019-04-10 NOTE — Telephone Encounter (Signed)
Rx sent electronically.  

## 2019-04-12 ENCOUNTER — Other Ambulatory Visit: Payer: Self-pay

## 2019-04-13 ENCOUNTER — Ambulatory Visit (INDEPENDENT_AMBULATORY_CARE_PROVIDER_SITE_OTHER): Payer: Medicare Other | Admitting: Internal Medicine

## 2019-04-13 ENCOUNTER — Ambulatory Visit (HOSPITAL_BASED_OUTPATIENT_CLINIC_OR_DEPARTMENT_OTHER)
Admission: RE | Admit: 2019-04-13 | Discharge: 2019-04-13 | Disposition: A | Discharge status: Home / Self Care | Payer: Medicare Other | Source: Ambulatory Visit | Attending: Internal Medicine | Admitting: Internal Medicine

## 2019-04-13 ENCOUNTER — Other Ambulatory Visit: Payer: Self-pay

## 2019-04-13 ENCOUNTER — Encounter: Payer: Self-pay | Admitting: Internal Medicine

## 2019-04-13 VITALS — BP 136/62 | HR 79 | Temp 97.5°F | Resp 18 | Ht 67.0 in | Wt 221.1 lb

## 2019-04-13 DIAGNOSIS — R351 Nocturia: Secondary | ICD-10-CM

## 2019-04-13 DIAGNOSIS — E119 Type 2 diabetes mellitus without complications: Secondary | ICD-10-CM | POA: Diagnosis not present

## 2019-04-13 DIAGNOSIS — R269 Unspecified abnormalities of gait and mobility: Secondary | ICD-10-CM | POA: Diagnosis not present

## 2019-04-13 DIAGNOSIS — R0600 Dyspnea, unspecified: Secondary | ICD-10-CM

## 2019-04-13 DIAGNOSIS — E785 Hyperlipidemia, unspecified: Secondary | ICD-10-CM

## 2019-04-13 DIAGNOSIS — I10 Essential (primary) hypertension: Secondary | ICD-10-CM

## 2019-04-13 DIAGNOSIS — R0609 Other forms of dyspnea: Secondary | ICD-10-CM

## 2019-04-13 DIAGNOSIS — J441 Chronic obstructive pulmonary disease with (acute) exacerbation: Secondary | ICD-10-CM | POA: Diagnosis not present

## 2019-04-13 DIAGNOSIS — N390 Urinary tract infection, site not specified: Secondary | ICD-10-CM

## 2019-04-13 LAB — COMPREHENSIVE METABOLIC PANEL
ALT: 14 U/L (ref 0–35)
AST: 20 U/L (ref 0–37)
Albumin: 3.8 g/dL (ref 3.5–5.2)
Alkaline Phosphatase: 59 U/L (ref 39–117)
BUN: 27 mg/dL — ABNORMAL HIGH (ref 6–23)
CO2: 31 mEq/L (ref 19–32)
Calcium: 9.1 mg/dL (ref 8.4–10.5)
Chloride: 100 mEq/L (ref 96–112)
Creatinine, Ser: 1.51 mg/dL — ABNORMAL HIGH (ref 0.40–1.20)
GFR: 32.29 mL/min — ABNORMAL LOW (ref >60.00)
Glucose, Bld: 185 mg/dL — ABNORMAL HIGH (ref 70–99)
Potassium: 3.9 mEq/L (ref 3.5–5.1)
Sodium: 138 mEq/L (ref 135–145)
Total Bilirubin: 0.4 mg/dL (ref 0.2–1.2)
Total Protein: 7.4 g/dL (ref 6.0–8.3)

## 2019-04-13 LAB — CBC WITH DIFFERENTIAL/PLATELET
Basophils Absolute: 0.1 10*3/uL (ref 0.0–0.1)
Basophils Relative: 0.6 % (ref 0.0–3.0)
Eosinophils Absolute: 0.1 10*3/uL (ref 0.0–0.7)
Eosinophils Relative: 1.3 % (ref 0.0–5.0)
HCT: 41.1 % (ref 36.0–46.0)
Hemoglobin: 13.4 g/dL (ref 12.0–15.0)
Lymphocytes Relative: 24.8 % (ref 12.0–46.0)
Lymphs Abs: 2.7 10*3/uL (ref 0.7–4.0)
MCHC: 32.5 g/dL (ref 30.0–36.0)
MCV: 87.4 fl (ref 78.0–100.0)
Monocytes Absolute: 1 10*3/uL (ref 0.1–1.0)
Monocytes Relative: 9.3 % (ref 3.0–12.0)
Neutro Abs: 6.9 10*3/uL (ref 1.4–7.7)
Neutrophils Relative %: 64 % (ref 43.0–77.0)
Platelets: 253 10*3/uL (ref 150.0–400.0)
RBC: 4.71 Mil/uL (ref 3.87–5.11)
RDW: 13.6 % (ref 11.5–15.5)
WBC: 10.8 10*3/uL — ABNORMAL HIGH (ref 4.0–10.5)

## 2019-04-13 LAB — URINALYSIS, ROUTINE W REFLEX MICROSCOPIC
Bilirubin Urine: NEGATIVE
Nitrite: NEGATIVE
Specific Gravity, Urine: >=1.03 — AB (ref 1.000–1.030)
Total Protein, Urine: 100 — AB
Urine Glucose: NEGATIVE
Urobilinogen, UA: 0.2 (ref 0.0–1.0)
pH: 5 (ref 5.0–8.0)

## 2019-04-13 LAB — LIPID PANEL
Cholesterol: 129 mg/dL (ref 0–200)
HDL: 31.3 mg/dL — ABNORMAL LOW (ref >39.00)
LDL Cholesterol: 66 mg/dL (ref 0–99)
NonHDL: 97.79
Total CHOL/HDL Ratio: 4
Triglycerides: 157 mg/dL — ABNORMAL HIGH (ref 0.0–149.0)
VLDL: 31.4 mg/dL (ref 0.0–40.0)

## 2019-04-13 LAB — HEMOGLOBIN A1C: Hgb A1c MFr Bld: 7.3 % — ABNORMAL HIGH (ref 4.6–6.5)

## 2019-04-13 LAB — TSH: TSH: 3.03 u[IU]/mL (ref 0.35–4.50)

## 2019-04-13 LAB — BRAIN NATRIURETIC PEPTIDE: Pro B Natriuretic peptide (BNP): 114 pg/mL — ABNORMAL HIGH (ref 0.0–100.0)

## 2019-04-13 MED ORDER — SULFAMETHOXAZOLE-TRIMETHOPRIM 800-160 MG PO TABS: 1.0000 | ORAL_TABLET | Freq: Two times a day (BID) | ORAL | 0 refills | Status: DC

## 2019-04-13 MED ORDER — NONFORMULARY OR COMPOUNDED ITEM: 0 refills | Status: DC

## 2019-04-13 MED ORDER — AMLODIPINE BESYLATE 10 MG PO TABS: 10.0000 mg | ORAL_TABLET | Freq: Every day | ORAL | 1 refills | Status: DC

## 2019-04-13 MED ORDER — LOSARTAN POTASSIUM-HCTZ 100-12.5 MG PO TABS: 1.0000 | ORAL_TABLET | Freq: Every day | ORAL | 1 refills | Status: DC

## 2019-04-13 MED ORDER — DULERA 100-5 MCG/ACT IN AERO: 2.0000 | INHALATION_SPRAY | Freq: Two times a day (BID) | RESPIRATORY_TRACT | 6 refills | Status: DC

## 2019-04-13 MED ORDER — SITAGLIPTIN PHOSPHATE 50 MG PO TABS: 50.0000 mg | ORAL_TABLET | Freq: Every day | ORAL | 1 refills | Status: DC

## 2019-04-13 MED ORDER — ATORVASTATIN CALCIUM 10 MG PO TABS: 10.0000 mg | ORAL_TABLET | Freq: Every day | ORAL | 1 refills | Status: DC

## 2019-04-13 MED ORDER — METOPROLOL SUCCINATE ER 25 MG PO TB24: 37.5000 mg | ORAL_TABLET | Freq: Every day | ORAL | 1 refills | Status: DC

## 2019-04-13 NOTE — Assessment & Plan Note (Signed)
DM: On Januvia, no ambulatory CBGs.  Check A1c HTN: On amlodipine, metoprolol, Hyzaar BP elevated upon arrival.  BP recheck 136/62 EKG today: NSR, no acute changes.  Continue present care. High cholesterol: On atorvastatin, check FLP COPD asthma: Increase wheezing, DOE and cough lately.  On Incruse Ellipta and albuterol.  Further advised after blood work, chest x-ray. Weight gain: + Peripheral edema, nocturia, increased DOE.  Slight increase JVD? Suspect CHF. Plan: BNP, CXR to assess volume overload Further advised with results Nocturia: Possibly related to daytime fluid retention, will check a UA urine culture Leg pain: Likely DJD, she has some pain with ambulation but pedal pulses are very good, doubt claudication.  Would like some medication, will discuss on RTC Results: A1c 7.3, satisfactory.  BNP 114, chest x-ray with no obvious CHF, kidney function decreased slightly worse than baseline. UA consistent with UTI. No obvious CHF, DOE probably a combination of deconditioning, weight gain, perhaps fluid retention from amlodipine. Plan: Bactrim DS twice daily #14 no refills Add Symbicort 2 puffs twice daily Left a detailed message to [name].  Prescriptions sent RTC 2 weeks

## 2019-04-13 NOTE — Patient Instructions (Addendum)
Please schedule Medicare Wellness with Glenard Haring.   Per our records you are due for an eye exam. Please contact your eye doctor to schedule an appointment. Please have them send copies of your office visit notes to [place]. Our fax number is [phone].   Check the  blood pressure 2 times per week BP GOAL is between 110/65 and  135/85. If it is consistently higher or lower, let me know    GO TO THE LAB : Get the blood work     [place], please reschedule your appointments Come back for   a checkup in 2 weeks  Go to the first floor, obtain your chest x-ray

## 2019-04-13 NOTE — Progress Notes (Signed)
Pre visit review using our clinic review tool, if applicable. No additional management support is needed unless otherwise documented below in the visit note. 

## 2019-04-13 NOTE — Progress Notes (Signed)
Subjective:    Patient ID: Rebecca Elliott, female    DOB: Dec 21, 1926, 86 y.o.   MRN: [id number]  DOS:  04/13/2019 Type of visit - description: Routine checkup, here with her [name]. Multiple symptoms: Low back pain Sometimes pain at the lower extremities  with ambulation but is mostly knee pain  Lower extremity edema worse at the end of the day Has noted weight gain in the last year: 10-2017: 209 pounds Today: 221 pounds Admits more DOE in the last few months. Lately more frequent cough increasing for at least a month.  She wonders if related to allergies. Denies orthopnea per se.  BP elevated today, no ambulatory BPs  + Nocturia, multiple times at night, no dysuria or gross hematuria per se  Review of Systems Denies chest pain Occasional palpitations with exertion No nausea, vomiting, diarrhea. Does have episodic rectal bleeding, history of hemorrhoids.  Past Medical History:  Diagnosis Date  . Allergic rhinitis   . COPD  and ASTHMA   . Depression   . Diabetes mellitus    as an adult  . DJD (degenerative joint disease)   . GERD (gastroesophageal reflux disease)    and HH w/ reflux  . History of colonic polyps   . Hyperlipidemia   . Hypertension     Past Surgical History:  Procedure Laterality Date  . ABDOMINAL HYSTERECTOMY     apparently no oophorectomy  . APPENDECTOMY    . bilateral breast tumor    . BREAST EXCISIONAL BIOPSY Bilateral    one in her 20's and one in her 50's, both benign  . CATARACT EXTRACTION, BILATERAL    . CHOLECYSTECTOMY    . TONSILLECTOMY    . TUBAL LIGATION      Allergies as of 04/13/2019   No Known Allergies     Medication List       Accurate as of April 13, 2019  4:34 PM. If you have any questions, ask your nurse or doctor.        albuterol 108 (90 Base) MCG/ACT inhaler Commonly known as: VENTOLIN HFA Inhale 2 puffs into the lungs every 6 (six) hours as needed for wheezing or shortness of breath.   amLODipine 10  MG tablet Commonly known as: NORVASC Take 1 tablet (10 mg total) by mouth daily.   aspirin 81 MG tablet Take 81 mg by mouth daily.   atorvastatin 10 MG tablet Commonly known as: LIPITOR Take 1 tablet (10 mg total) by mouth daily.   BENEFIBER DRINK MIX PO Take by mouth 2 (two) times daily.   Centrum Silver Chew Chew by mouth.   dextromethorphan-guaiFENesin 30-600 MG 12hr tablet Commonly known as: MUCINEX DM Take 1 tablet by mouth daily.   diclofenac sodium 1 % Gel Commonly known as: VOLTAREN Apply 2 g topically 4 (four) times daily.   docusate sodium 100 MG capsule Commonly known as: COLACE Take 1-2 by mouth once a day.   Dulera 100-5 MCG/ACT Aero Generic drug: mometasone-formoterol Inhale 2 puffs into the lungs in the morning and at bedtime. Started by: Kathlene November, MD   famotidine 20 MG tablet Commonly known as: PEPCID Take 1 tablet (20 mg total) by mouth 2 (two) times daily.   hydrocortisone 2.5 % cream Apply topically 2 (two) times daily.   Incruse Ellipta 62.5 MCG/INH Aepb Generic drug: umeclidinium bromide Inhale 1 puff into the lungs daily at 12 noon.   Lidocaine 3 % Crea Apply 1 application topically 3 (three) times daily  as needed.   loratadine 10 MG tablet Commonly known as: CLARITIN Take 10 mg by mouth daily.   losartan-hydrochlorothiazide 100-12.5 MG tablet Commonly known as: HYZAAR Take 1 tablet by mouth daily.   metoprolol succinate 25 MG 24 hr tablet Commonly known as: TOPROL-XL Take 1.5 tablets (37.5 mg total) by mouth daily.   NONFORMULARY OR COMPOUNDED ITEM Compression Stockings Dx: M79.89 Started by: Kathlene November, MD   pantoprazole 40 MG tablet Commonly known as: PROTONIX Take 1 tablet (40 mg total) by mouth every other day.   sitaGLIPtin 50 MG tablet Commonly known as: Januvia Take 1 tablet (50 mg total) by mouth daily.   sulfamethoxazole-trimethoprim 800-160 MG tablet Commonly known as: BACTRIM DS Take 1 tablet by mouth 2 (two)  times daily. Started by: Kathlene November, MD            Durable Medical Equipment  (From admission, onward)         Start     Ordered   04/13/19 0000  For home use only DME 4 wheeled rolling walker with seat [phone])    Question:  Patient needs a walker to treat with the following condition  Answer:  Gait disorder   04/13/19 1008             Objective:   Physical Exam BP 136/62   Pulse 79   Temp (!) 97.5 F (36.4 C) (Temporal)   Resp 18   Ht 5\' 7"  (1.702 m)   Wt 221 lb 2 oz (100.3 kg)   SpO2 96%   BMI 34.63 kg/m  General:   Well developed, NAD, BMI noted.  HEENT:  Normocephalic . Face symmetric, atraumatic Neck: ?  + Mildly JVD at 45 degrees Lungs:  Increased expiratory time, decreased breath sounds, few wheezes. Normal respiratory effort, no intercostal retractions, no accessory muscle use. Heart: RRR,  no murmur.  Abdomen:  Not distended, soft, non-tender. No rebound or rigidity.   Skin: Not pale. Not jaundice Lower extremities: +/+++ pretibial edema bilaterally, symmetric.  Good pedal pulses. Neurologic:  alert & oriented X3.  Speech normal, gait assisted by a walker, needs help transferring. Psych--  Cognition and judgment appear intact.  Cooperative with normal attention span and concentration.  Behavior appropriate. No anxious or depressed appearing.     Assessment     ASSESSMENT DM--no neuropathy, Metformin DC 04-2015 d/t creatinine HTN  Hyperlipidemia Depression COPD - Asthma Pulmonary nodules per CXR 03-2014, CT 03-2016, stable, no further CTs needed Anemia, chronic, on-off iron def  Colonoscopy 2004: No polyps EGD 2006 for dysphagia showed hiatal hernia, occult  stricture? Colonoscopy 2010 showed diverticuli Dysphagia: Barium swallow showed dysmotility 08-2014 DJD GERD   PLAN: DM: On Januvia, no ambulatory CBGs.  Check A1c HTN: On amlodipine, metoprolol, Hyzaar BP elevated upon arrival.  BP recheck 136/62 EKG today: NSR, no acute  changes.  Continue present care. High cholesterol: On atorvastatin, check FLP COPD asthma: Increase wheezing, DOE and cough lately.  On Incruse Ellipta and albuterol.  Further advised after blood work, chest x-ray. Weight gain: + Peripheral edema, nocturia, increased DOE.  Slight increase JVD? Suspect CHF. Plan: BNP, CXR to assess volume overload Further advised with results Nocturia: Possibly related to daytime fluid retention, will check a UA urine culture Leg pain: Likely DJD, she has some pain with ambulation but pedal pulses are very good, doubt claudication.  Would like some medication, will discuss on RTC Results: A1c 7.3, satisfactory.  BNP 114, chest x-ray with no obvious  CHF, kidney function decreased slightly worse than baseline. UA consistent with UTI. No obvious CHF, DOE probably a combination of deconditioning, weight gain, perhaps fluid retention from amlodipine. Plan: Bactrim DS twice daily #14 no refills Add Symbicort 2 puffs twice daily Left a detailed message to [name].  Prescriptions sent RTC 2 weeks  Time spent today 45 minutes.  This visit occurred during the SARS-CoV-2 public health emergency.  Safety protocols were in place, including screening questions prior to the visit, additional usage of staff PPE, and extensive cleaning of exam room while observing appropriate contact time as indicated for disinfecting solutions.

## 2019-04-15 LAB — URINE CULTURE
MICRO NUMBER:: 10297049
SPECIMEN QUALITY:: ADEQUATE

## 2019-04-17 ENCOUNTER — Other Ambulatory Visit (HOSPITAL_BASED_OUTPATIENT_CLINIC_OR_DEPARTMENT_OTHER): Payer: Self-pay | Admitting: Internal Medicine

## 2019-04-17 DIAGNOSIS — Z1231 Encounter for screening mammogram for malignant neoplasm of breast: Secondary | ICD-10-CM

## 2019-04-18 ENCOUNTER — Telehealth: Payer: Self-pay | Admitting: Internal Medicine

## 2019-04-18 MED ORDER — ALBUTEROL SULFATE HFA 108 (90 BASE) MCG/ACT IN AERS: 2.0000 | INHALATION_SPRAY | Freq: Four times a day (QID) | RESPIRATORY_TRACT | 3 refills | Status: DC | PRN

## 2019-04-18 NOTE — Telephone Encounter (Signed)
Rebecca Elliott from [facility] [phone]... Refer  [id number]   Rebecca Elliott wants to get approval of Albuteral Pro Air HFA inhaler 90 mg MCG Rebecca Elliott states this Is the correct one that is covered. Needs a verbal order to okay so it can be sent to patient asap. Thanks

## 2019-04-18 NOTE — Telephone Encounter (Signed)
Rx sent 

## 2019-04-19 DIAGNOSIS — E119 Type 2 diabetes mellitus without complications: Secondary | ICD-10-CM | POA: Diagnosis not present

## 2019-04-19 DIAGNOSIS — Z961 Presence of intraocular lens: Secondary | ICD-10-CM | POA: Diagnosis not present

## 2019-04-19 DIAGNOSIS — H10413 Chronic giant papillary conjunctivitis, bilateral: Secondary | ICD-10-CM | POA: Diagnosis not present

## 2019-04-19 DIAGNOSIS — H353221 Exudative age-related macular degeneration, left eye, with active choroidal neovascularization: Secondary | ICD-10-CM | POA: Diagnosis not present

## 2019-04-19 DIAGNOSIS — H3581 Retinal edema: Secondary | ICD-10-CM | POA: Diagnosis not present

## 2019-04-19 DIAGNOSIS — H04123 Dry eye syndrome of bilateral lacrimal glands: Secondary | ICD-10-CM | POA: Diagnosis not present

## 2019-04-19 DIAGNOSIS — H353111 Nonexudative age-related macular degeneration, right eye, early dry stage: Secondary | ICD-10-CM | POA: Diagnosis not present

## 2019-04-19 DIAGNOSIS — H26491 Other secondary cataract, right eye: Secondary | ICD-10-CM | POA: Diagnosis not present

## 2019-04-19 DIAGNOSIS — H3562 Retinal hemorrhage, left eye: Secondary | ICD-10-CM | POA: Diagnosis not present

## 2019-04-19 DIAGNOSIS — H35373 Puckering of macula, bilateral: Secondary | ICD-10-CM | POA: Diagnosis not present

## 2019-04-19 LAB — HM DIABETES EYE EXAM

## 2019-04-23 ENCOUNTER — Encounter (INDEPENDENT_AMBULATORY_CARE_PROVIDER_SITE_OTHER): Payer: Self-pay | Admitting: Ophthalmology

## 2019-04-23 ENCOUNTER — Other Ambulatory Visit: Payer: Self-pay

## 2019-04-23 ENCOUNTER — Ambulatory Visit (INDEPENDENT_AMBULATORY_CARE_PROVIDER_SITE_OTHER): Payer: BLUE CROSS/BLUE SHIELD | Admitting: Ophthalmology

## 2019-04-23 DIAGNOSIS — H35072 Retinal telangiectasis, left eye: Secondary | ICD-10-CM | POA: Diagnosis not present

## 2019-04-23 DIAGNOSIS — R0683 Snoring: Secondary | ICD-10-CM | POA: Insufficient documentation

## 2019-04-23 DIAGNOSIS — H353221 Exudative age-related macular degeneration, left eye, with active choroidal neovascularization: Secondary | ICD-10-CM | POA: Diagnosis not present

## 2019-04-23 DIAGNOSIS — H35352 Cystoid macular degeneration, left eye: Secondary | ICD-10-CM | POA: Diagnosis not present

## 2019-04-23 MED ORDER — FLUORESCEIN SODIUM 10 % IV SOLN
500.0000 mg | INTRAVENOUS | Status: AC | PRN
  Administered 2019-04-23: 13:00:00 500 mg via INTRAVENOUS

## 2019-04-23 NOTE — Assessment & Plan Note (Signed)
Macular telangiectasis (MAC-TEL), or parafoveal telangiectasis is a condition of unknown cause.  Findings in or near the macula (center of vision) consist of microaneurysms (leaking small capillaries), often with leakage of fluid which in the active phase can impact fine discriminatory vision, and in some cases trigger profound scarring in the macula, with severe permanent vision loss.  Standard treatment is observation and periodic examinations to monitor for treatable complications.  However, this practice has discovered an association with sleep apnea with its nightly periods of low oxygen in the blood stream (hypoxia).  More recently, some patients also been found to have advanced lung disease, whether asthma or COPD, with similar findings.  Dr. Zadie Rhine has been evaluating the association of sleep apnea, nightly hypoxia, and Macular telangiectasis for over 18 years.  Most patients are offered apnea screening testing at home and if findings are positive for sleep apnea, more formal, extensive sleep laboratory testing, may be recommended.  Numerous patients, proven to have MAC-TEL, have improved or resolved their condition promptly, within weeks, of the use of nighttime oxygen supplementation or continuous positive airway pressure (CPAP).  Pt has POSITIVE ,, re daily napping, feeling tired each day and snoring while sleeping.  View of systems for sleep apnea, with multiple times awakening for bathroom use, and snoring while asleep sleeping. Patient and family are encouraged to prompt primary care physician for sleep apnea testing, even at home, and to proceed with therapy even if borderline symptoms or findings are noted.  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

## 2019-04-23 NOTE — Patient Instructions (Signed)
Patient to follow-up with PCP for sleep apnea testing.  Patient and family understand that I would like to perform OCT undilated 2 weeks prior to beginning therapy for sleep apnea and it repeated again 2 weeks after treatment begins

## 2019-04-25 ENCOUNTER — Telehealth: Payer: Self-pay | Admitting: Internal Medicine

## 2019-04-25 DIAGNOSIS — Z0189 Encounter for other specified special examinations: Secondary | ICD-10-CM

## 2019-04-25 NOTE — Telephone Encounter (Signed)
See note from ophthalmology, the suspected sleep apnea and recommend a sleep study.  Please arrange a pulmonary referral and let the  patient know.

## 2019-04-26 DIAGNOSIS — R05 Cough: Secondary | ICD-10-CM | POA: Diagnosis not present

## 2019-04-26 DIAGNOSIS — R040 Epistaxis: Secondary | ICD-10-CM | POA: Diagnosis not present

## 2019-04-26 NOTE — Telephone Encounter (Signed)
Referral placed.  Mychart message sent. 

## 2019-04-27 ENCOUNTER — Telehealth: Payer: Self-pay | Admitting: *Deleted

## 2019-04-27 ENCOUNTER — Encounter: Payer: Self-pay | Admitting: Internal Medicine

## 2019-04-27 NOTE — Telephone Encounter (Signed)
error 

## 2019-05-01 ENCOUNTER — Other Ambulatory Visit: Payer: Self-pay

## 2019-05-02 ENCOUNTER — Ambulatory Visit (INDEPENDENT_AMBULATORY_CARE_PROVIDER_SITE_OTHER): Payer: Medicare Other | Admitting: Internal Medicine

## 2019-05-02 ENCOUNTER — Ambulatory Visit: Payer: Medicare Other | Admitting: Internal Medicine

## 2019-05-02 ENCOUNTER — Encounter: Payer: Self-pay | Admitting: Internal Medicine

## 2019-05-02 VITALS — BP 155/52 | HR 66 | Temp 96.7°F | Resp 18 | Ht 67.0 in | Wt 222.4 lb

## 2019-05-02 DIAGNOSIS — E119 Type 2 diabetes mellitus without complications: Secondary | ICD-10-CM | POA: Diagnosis not present

## 2019-05-02 DIAGNOSIS — I10 Essential (primary) hypertension: Secondary | ICD-10-CM | POA: Diagnosis not present

## 2019-05-02 DIAGNOSIS — R040 Epistaxis: Secondary | ICD-10-CM

## 2019-05-02 DIAGNOSIS — J441 Chronic obstructive pulmonary disease with (acute) exacerbation: Secondary | ICD-10-CM

## 2019-05-02 MED ORDER — TRELEGY ELLIPTA 100-62.5-25 MCG/INH IN AEPB: 1.0000 | INHALATION_SPRAY | Freq: Every day | RESPIRATORY_TRACT | 6 refills | Status: DC

## 2019-05-02 NOTE — Progress Notes (Signed)
Pre visit review using our clinic review tool, if applicable. No additional management support is needed unless otherwise documented below in the visit note. 

## 2019-05-02 NOTE — Progress Notes (Signed)
Subjective:    Patient ID: Rebecca Elliott, female    DOB: 05/27/1926, 86 y.o.   MRN: [id number]  DOS:  05/02/2019 Type of visit - description: Follow-up from previous visit, here with her daughter With talk about a recent UTI, increased respiratory symptoms, hypertension. She also is concerned about cost of medicines. In addition, she reports right-sided nosebleeds frequently in the last few weeks.  Some allergy symptoms but not more than usual. No green nasal  discharge noted.   Review of Systems Nocturia has decreased to 2 times at night which is her baseline Wheezing has definitely decreased, still DOE.   Past Medical History:  Diagnosis Date  . Allergic rhinitis   . COPD  and ASTHMA   . Depression   . Diabetes mellitus    as an adult  . DJD (degenerative joint disease)   . GERD (gastroesophageal reflux disease)    and HH w/ reflux  . History of colonic polyps   . Hyperlipidemia   . Hypertension     Past Surgical History:  Procedure Laterality Date  . ABDOMINAL HYSTERECTOMY     apparently no oophorectomy  . APPENDECTOMY    . bilateral breast tumor    . BREAST EXCISIONAL BIOPSY Bilateral    one in her 20's and one in her 50's, both benign  . CATARACT EXTRACTION Bilateral    Groat  . CATARACT EXTRACTION, BILATERAL    . CHOLECYSTECTOMY    . TONSILLECTOMY    . TUBAL LIGATION      Allergies as of 05/02/2019   No Known Allergies     Medication List       Accurate as of May 02, 2019 11:59 PM. If you have any questions, ask your nurse or doctor.        STOP taking these medications   sulfamethoxazole-trimethoprim 800-160 MG tablet Commonly known as: BACTRIM DS Stopped by: Kathlene November, MD     TAKE these medications   albuterol 108 (90 Base) MCG/ACT inhaler Commonly known as: ProAir HFA Inhale 2 puffs into the lungs every 6 (six) hours as needed for wheezing or shortness of breath.   amLODipine 10 MG tablet Commonly known as: NORVASC Take 1 tablet (10  mg total) by mouth daily.   aspirin 81 MG tablet Take 81 mg by mouth daily.   atorvastatin 10 MG tablet Commonly known as: LIPITOR Take 1 tablet (10 mg total) by mouth daily.   BENEFIBER DRINK MIX PO Take by mouth 2 (two) times daily.   Centrum Silver Chew Chew by mouth.   dextromethorphan-guaiFENesin 30-600 MG 12hr tablet Commonly known as: MUCINEX DM Take 1 tablet by mouth daily.   diclofenac sodium 1 % Gel Commonly known as: VOLTAREN Apply 2 g topically 4 (four) times daily.   docusate sodium 100 MG capsule Commonly known as: COLACE Take 1-2 by mouth once a day.   Dulera 100-5 MCG/ACT Aero Generic drug: mometasone-formoterol Inhale 2 puffs into the lungs in the morning and at bedtime.   famotidine 20 MG tablet Commonly known as: PEPCID Take 1 tablet (20 mg total) by mouth 2 (two) times daily.   hydrocortisone 2.5 % cream Apply topically 2 (two) times daily.   Incruse Ellipta 62.5 MCG/INH Aepb Generic drug: umeclidinium bromide Inhale 1 puff into the lungs daily at 12 noon.   Lidocaine 3 % Crea Apply 1 application topically 3 (three) times daily as needed.   loratadine 10 MG tablet Commonly known as: CLARITIN Take 10 mg  by mouth daily.   losartan-hydrochlorothiazide 100-12.5 MG tablet Commonly known as: HYZAAR Take 1 tablet by mouth daily.   metoprolol succinate 25 MG 24 hr tablet Commonly known as: TOPROL-XL Take 1.5 tablets (37.5 mg total) by mouth daily.   NONFORMULARY OR COMPOUNDED ITEM Compression Stockings Dx: M79.89   pantoprazole 40 MG tablet Commonly known as: PROTONIX Take 1 tablet (40 mg total) by mouth every other day.   sitaGLIPtin 50 MG tablet Commonly known as: Januvia Take 1 tablet (50 mg total) by mouth daily.   Trelegy Ellipta 100-62.5-25 MCG/INH Aepb Generic drug: Fluticasone-Umeclidin-Vilant Inhale 1 Inhaler into the lungs daily. Started by: Kathlene November, MD   TYLENOL 500 MG tablet Generic drug: acetaminophen Take 500 mg by  mouth as needed.          Objective:   Physical Exam BP (!) 155/52 (BP Location: Left Arm, Patient Position: Sitting, Cuff Size: Normal)   Pulse 66   Temp (!) 96.7 F (35.9 C) (Temporal)   Resp 18   Ht 5\' 7"  (1.702 m)   Wt 222 lb 6 oz (100.9 kg)   SpO2 98%   BMI 34.83 kg/m  General:   Well developed, NAD, BMI noted. HEENT:  Normocephalic . Face symmetric, atraumatic. Simple inspection of the nostrils essentially normal Lungs:  Scattered wheezes, moving better air compared to the last time Normal respiratory effort, no intercostal retractions, no accessory muscle use. Heart: RRR,  no murmur.  Lower extremities: Trace pretibial edema bilaterally  Skin: Not pale. Not jaundice Neurologic:  alert & oriented X3.  Speech normal, gait assisted by a walker  Psych--  Cognition and judgment appear intact.  Cooperative with normal attention span and concentration.  Behavior appropriate. No anxious or depressed appearing.      Assessment      ASSESSMENT DM--no neuropathy, Metformin DC 04-2015 d/t creatinine HTN  Hyperlipidemia Depression COPD - Asthma Pulmonary nodules per CXR 03-2014, CT 03-2016, stable, no further CTs needed Anemia, chronic, on-off iron def  Colonoscopy 2004: No polyps EGD 2006 for dysphagia showed hiatal hernia, occult  stricture? Colonoscopy 2010 showed diverticuli Dysphagia: Barium swallow showed dysmotility 08-2014 DJD GERD   PLAN: Overall feeling better since the last office visit  DM: On Januvia, last A1c 7.3, controlled. HTN: BP today slightly elevated, at home consistently in the 140/60, no change COPD asthma: Since the last visit, chest x-ray, BNP did not support CHF, she is better with addition of Dulera.  Cost is an issue. Plan: Continue albuterol as needed Continue Dulera  & Incruse Ellipta but they could be replaced by Trelegy, Rx provided, also referred to our pharmacist, see AVS. UTI: Status for Bactrim, nocturia decreased to  baseline, twice a night. OSA?  Received a note from ophthalmology, they are concerned about sleep apnea, referral has been sent Right-sided nosebleeds: Exam is benign, refer to ENT if symptoms persist. RTC 3 months    This visit occurred during the SARS-CoV-2 public health emergency.  Safety protocols were in place, including screening questions prior to the visit, additional usage of staff PPE, and extensive cleaning of exam room while observing appropriate contact time as indicated for disinfecting solutions.

## 2019-05-02 NOTE — Patient Instructions (Addendum)
Please schedule Medicare Wellness with Glenard Haring.   Referral for sleep apnea testing: [facility] [address] [place], [place] [address] [phone]  Continue albuterol  You potentially could take Trelegy instead of Dulera plus Incruise Ellipta  We are referring you to our pharmacist  Continue checking your blood pressures. BP GOAL is between 110/65 and  135/85. If it is consistently higher or lower, let me know     GO TO THE FRONT DESK, [place] back for   a checkup in 3 months

## 2019-05-04 NOTE — Assessment & Plan Note (Signed)
Overall feeling better since the last office visit  DM: On Januvia, last A1c 7.3, controlled. HTN: BP today slightly elevated, at home consistently in the 140/60, no change COPD asthma: Since the last visit, chest x-ray, BNP did not support CHF, she is better with addition of Dulera.  Cost is an issue. Plan: Continue albuterol as needed Continue Dulera  & Incruse Ellipta but they could be replaced by Trelegy, Rx provided, also referred to our pharmacist, see AVS. UTI: Status for Bactrim, nocturia decreased to baseline, twice a night. OSA?  Received a note from ophthalmology, they are concerned about sleep apnea, referral has been sent Right-sided nosebleeds: Exam is benign, refer to ENT if symptoms persist. RTC 3 months

## 2019-05-07 ENCOUNTER — Telehealth: Payer: Self-pay | Admitting: Internal Medicine

## 2019-05-07 NOTE — Progress Notes (Signed)
Patients daughter, [name] was at work. Scheduled a call back when she available on break.   Raynicia Dukes [employer] Scheduler

## 2019-05-08 DIAGNOSIS — D225 Melanocytic nevi of trunk: Secondary | ICD-10-CM | POA: Diagnosis not present

## 2019-05-08 DIAGNOSIS — L821 Other seborrheic keratosis: Secondary | ICD-10-CM | POA: Diagnosis not present

## 2019-05-08 DIAGNOSIS — L820 Inflamed seborrheic keratosis: Secondary | ICD-10-CM | POA: Diagnosis not present

## 2019-05-08 DIAGNOSIS — L578 Other skin changes due to chronic exposure to nonionizing radiation: Secondary | ICD-10-CM | POA: Diagnosis not present

## 2019-05-08 DIAGNOSIS — D692 Other nonthrombocytopenic purpura: Secondary | ICD-10-CM | POA: Diagnosis not present

## 2019-05-08 DIAGNOSIS — L814 Other melanin hyperpigmentation: Secondary | ICD-10-CM | POA: Diagnosis not present

## 2019-05-14 ENCOUNTER — Ambulatory Visit (HOSPITAL_BASED_OUTPATIENT_CLINIC_OR_DEPARTMENT_OTHER): Payer: Medicare Other

## 2019-05-15 ENCOUNTER — Other Ambulatory Visit: Payer: Self-pay

## 2019-05-15 ENCOUNTER — Ambulatory Visit (HOSPITAL_BASED_OUTPATIENT_CLINIC_OR_DEPARTMENT_OTHER)
Admission: RE | Admit: 2019-05-15 | Discharge: 2019-05-15 | Disposition: A | Discharge status: Home / Self Care | Payer: Medicare Other | Source: Ambulatory Visit | Attending: Internal Medicine | Admitting: Internal Medicine

## 2019-05-15 DIAGNOSIS — Z1231 Encounter for screening mammogram for malignant neoplasm of breast: Secondary | ICD-10-CM | POA: Insufficient documentation

## 2019-05-23 ENCOUNTER — Other Ambulatory Visit: Payer: Self-pay

## 2019-05-23 ENCOUNTER — Encounter: Payer: Self-pay | Admitting: Pulmonary Disease

## 2019-05-23 ENCOUNTER — Ambulatory Visit (INDEPENDENT_AMBULATORY_CARE_PROVIDER_SITE_OTHER): Payer: Medicare Other | Admitting: Pulmonary Disease

## 2019-05-23 VITALS — BP 138/86 | HR 90 | Temp 98.3°F | Ht 66.0 in | Wt 223.0 lb

## 2019-05-23 DIAGNOSIS — G4733 Obstructive sleep apnea (adult) (pediatric): Secondary | ICD-10-CM

## 2019-05-23 NOTE — Progress Notes (Signed)
Rebecca Elliott    [id number]    June 24, 1926  Primary Care Physician:Paz, Alda Berthold, MD  Referring Physician: Colon Branch, MD [address] [place],  [place] [address]  Chief complaint:   Patient with a history of witnessed apneas, multiple awakenings, nonrestorative sleep  HPI:  She falls asleep easily during the day Wakes up often at night at least 2 or 3 times  Many dreams Falls asleep anytime she is watching TV  Witnessed apneas Gasping respirations Dryness of the mouth in the mornings Denies headaches  History of hypertension, asthma  Weight has been relatively stable  She is short of breath with mild to moderate exertion  Reformed smoker, quit in 1994  She had lung nodules in the past that were followed   Outpatient Encounter Medications as of 05/23/2019  Medication Sig  . acetaminophen (TYLENOL) 500 MG tablet Take 500 mg by mouth as needed.  Marland Kitchen albuterol (PROAIR HFA) 108 (90 Base) MCG/ACT inhaler Inhale 2 puffs into the lungs every 6 (six) hours as needed for wheezing or shortness of breath.  Marland Kitchen amLODipine (NORVASC) 10 MG tablet Take 1 tablet (10 mg total) by mouth daily.  Marland Kitchen aspirin 81 MG tablet Take 81 mg by mouth daily.   Marland Kitchen atorvastatin (LIPITOR) 10 MG tablet Take 1 tablet (10 mg total) by mouth daily.  Marland Kitchen dextromethorphan-guaiFENesin (MUCINEX DM) 30-600 MG per 12 hr tablet Take 1 tablet by mouth daily.    . diclofenac sodium (VOLTAREN) 1 % GEL Apply 2 g topically 4 (four) times daily.  Marland Kitchen docusate sodium (COLACE) 100 MG capsule Take 1-2 by mouth once a day.   . famotidine (PEPCID) 20 MG tablet Take 1 tablet (20 mg total) by mouth 2 (two) times daily.  . Fluticasone-Umeclidin-Vilant (TRELEGY ELLIPTA) 100-62.5-25 MCG/INH AEPB Inhale 1 Inhaler into the lungs daily.  . hydrocortisone 2.5 % cream Apply topically 2 (two) times daily.  . Lidocaine 3 % CREA Apply 1 application topically 3 (three) times daily as needed.  . loratadine (CLARITIN) 10 MG  tablet Take 10 mg by mouth daily.    Marland Kitchen losartan-hydrochlorothiazide (HYZAAR) 100-12.5 MG tablet Take 1 tablet by mouth daily.  . metoprolol succinate (TOPROL-XL) 25 MG 24 hr tablet Take 1.5 tablets (37.5 mg total) by mouth daily.  . mometasone-formoterol (DULERA) 100-5 MCG/ACT AERO Inhale 2 puffs into the lungs in the morning and at bedtime.  . Multiple Vitamins-Minerals (CENTRUM SILVER) CHEW Chew by mouth.    . NONFORMULARY OR COMPOUNDED ITEM Compression Stockings Dx: M79.89  . pantoprazole (PROTONIX) 40 MG tablet Take 1 tablet (40 mg total) by mouth every other day.  . sitaGLIPtin (JANUVIA) 50 MG tablet Take 1 tablet (50 mg total) by mouth daily.  Marland Kitchen umeclidinium bromide (INCRUSE ELLIPTA) 62.5 MCG/INH AEPB Inhale 1 puff into the lungs daily at 12 noon.  . Wheat Dextrin (BENEFIBER DRINK MIX PO) Take by mouth 2 (two) times daily.     No facility-administered encounter medications on file as of 05/23/2019.    Allergies as of 05/23/2019  . (No Known Allergies)    Past Medical History:  Diagnosis Date  . Allergic rhinitis   . COPD  and ASTHMA   . Depression   . Diabetes mellitus    as an adult  . DJD (degenerative joint disease)   . GERD (gastroesophageal reflux disease)    and HH w/ reflux  . History of colonic polyps   .  Hyperlipidemia   . Hypertension     Past Surgical History:  Procedure Laterality Date  . ABDOMINAL HYSTERECTOMY     apparently no oophorectomy  . APPENDECTOMY    . bilateral breast tumor    . BREAST EXCISIONAL BIOPSY Bilateral    one in her 20's and one in her 50's, both benign  . CATARACT EXTRACTION Bilateral    Groat  . CATARACT EXTRACTION, BILATERAL    . CHOLECYSTECTOMY    . TONSILLECTOMY    . TUBAL LIGATION      Family History  Problem Relation Age of Onset  . Breast cancer Sister        x 2  . Diabetes Father   . Diabetes Sister   . Heart attack Brother        early 66s  . Heart attack Son   . Pancreatic cancer Son   . Stomach cancer  Sister   . Colon cancer Neg Hx     Social History   Socioeconomic History  . Marital status: Widowed    Spouse name: Not on file  . Number of children: 5  . Years of education: Not on file  . Highest education level: Not on file  Occupational History  . Occupation: retired   Tobacco Use  . Smoking status: Former Smoker    Packs/day: 1.00    Years: 35.00    Pack years: 35.00    Types: Cigarettes    Quit date: 01/18/1993    Years since quitting: 26.3  . Smokeless tobacco: Never Used  Substance and Sexual Activity  . Alcohol use: No    Alcohol/week: 0.0 standard drinks  . Drug use: No  . Sexual activity: Not on file  Other Topics Concern  . Not on file  Social History Narrative   Household: pt and 2 daughters   widow (1994), children 5 (lost 3 sons,2 alive, daughters)   [name] 32 y/o   GGchildren x 2    Social Determinants of Radio broadcast assistant Strain:   . Difficulty of Paying Living Expenses:   Food Insecurity:   . Worried About Charity fundraiser in the Last Year:   . Arboriculturist in the Last Year:   Transportation Needs:   . Film/video editor (Medical):   Marland Kitchen Lack of Transportation (Non-Medical):   Physical Activity:   . Days of Exercise per Week:   . Minutes of Exercise per Session:   Stress:   . Feeling of Stress :   Social Connections:   . Frequency of Communication with Friends and Family:   . Frequency of Social Gatherings with Friends and Family:   . Attends Religious Services:   . Active Member of Clubs or Organizations:   . Attends Archivist Meetings:   Marland Kitchen Marital Status:   Intimate Partner Violence:   . Fear of Current or Ex-Partner:   . Emotionally Abused:   Marland Kitchen Physically Abused:   . Sexually Abused:     Review of Systems  Constitutional: Negative.   Respiratory: Positive for shortness of breath.   Psychiatric/Behavioral: Positive for sleep disturbance.  All other systems reviewed and are negative.  Vitals:    05/23/19 1034  BP: 138/86  Pulse: 90  Temp: 98.3 F (36.8 C)  SpO2: 91%    Physical Exam  Constitutional: She appears well-developed.  Obese  HENT:  Head: Normocephalic and atraumatic.  Mallampati 4, crowded oropharynx  Eyes: Pupils are equal, round, and  reactive to light.  Neck: No tracheal deviation present. No thyromegaly present.  Cardiovascular: Normal rate and regular rhythm.  Pulmonary/Chest: Effort normal and breath sounds normal. No respiratory distress. She has no wheezes. She has no rales.  Musculoskeletal:     Cervical back: Normal range of motion and neck supple.  Neurological: She is alert.  Skin: Skin is warm.   Results of the Epworth flowsheet 05/23/2019  Sitting and reading 3  Watching TV 3  Sitting, inactive in a public place (e.g. a theatre or a meeting) 3  As a passenger in a car for an hour without a break 0  Lying down to rest in the afternoon when circumstances permit 3  Sitting and talking to someone 1  Sitting quietly after a lunch without alcohol 0  In a car, while stopped for a few minutes in traffic 0  Total score 13    Data Reviewed: Chest x-ray April 13, 2019 shows no acute abnormalities  Assessment:  Moderate probability of significant obstructive sleep apnea  Excessive daytime sleepiness  Nonrestorative sleep  Obstructive lung disease for which she continues on inhalers  Plan/Recommendations: We will obtain a home sleep study  Treatment options discussed  CPAP therapy if moderate to significant disease  Multiple awakenings may be related to nonconsolidated sleep, sleep apnea may also be contributing  We will follow in 2 to 3 months  Encouraged to call with any significant concerns   Sherrilyn Rist MD [facility] 05/23/2019, 10:58 AM  CC: Colon Branch, MD

## 2019-05-23 NOTE — Patient Instructions (Signed)
Excessive daytime sleepiness Apneas  Moderate probability of significant obstructive sleep apnea  We will schedule you for home sleep study Update you with results  Treatment options as discussed  I will see you back in about 2 months  Call with significant concerns Sleep Apnea Sleep apnea is a condition in which breathing pauses or becomes shallow during sleep. Episodes of sleep apnea usually last 10 seconds or longer, and they may occur as many as 20 times an hour. Sleep apnea disrupts your sleep and keeps your body from getting the rest that it needs. This condition can increase your risk of certain health problems, including:  Heart attack.  Stroke.  Obesity.  Diabetes.  Heart failure.  Irregular heartbeat. What are the causes? There are three kinds of sleep apnea:  Obstructive sleep apnea. This kind is caused by a blocked or collapsed airway.  Central sleep apnea. This kind happens when the part of the brain that controls breathing does not send the correct signals to the muscles that control breathing.  Mixed sleep apnea. This is a combination of obstructive and central sleep apnea. The most common cause of this condition is a collapsed or blocked airway. An airway can collapse or become blocked if:  Your throat muscles are abnormally relaxed.  Your tongue and tonsils are larger than normal.  You are overweight.  Your airway is smaller than normal. What increases the risk? You are more likely to develop this condition if you:  Are overweight.  Smoke.  Have a smaller than normal airway.  Are elderly.  Are female.  Drink alcohol.  Take sedatives or tranquilizers.  Have a family history of sleep apnea. What are the signs or symptoms? Symptoms of this condition include:  Trouble staying asleep.  Daytime sleepiness and tiredness.  Irritability.  Loud snoring.  Morning headaches.  Trouble concentrating.  Forgetfulness.  Decreased interest  in sex.  Unexplained sleepiness.  Mood swings.  Personality changes.  Feelings of depression.  Waking up often during the night to urinate.  Dry mouth.  Sore throat. How is this diagnosed? This condition may be diagnosed with:  A medical history.  A physical exam.  A series of tests that are done while you are sleeping (sleep study). These tests are usually done in a sleep lab, but they may also be done at home. How is this treated? Treatment for this condition aims to restore normal breathing and to ease symptoms during sleep. It may involve managing health issues that can affect breathing, such as high blood pressure or obesity. Treatment may include:  Sleeping on your side.  Using a decongestant if you have nasal congestion.  Avoiding the use of depressants, including alcohol, sedatives, and narcotics.  Losing weight if you are overweight.  Making changes to your diet.  Quitting smoking.  Using a device to open your airway while you sleep, such as: ? An oral appliance. This is a custom-made mouthpiece that shifts your lower jaw forward. ? A continuous positive airway pressure (CPAP) device. This device blows air through a mask when you breathe out (exhale). ? A nasal expiratory positive airway pressure (EPAP) device. This device has valves that you put into each nostril. ? A bi-level positive airway pressure (BPAP) device. This device blows air through a mask when you breathe in (inhale) and breathe out (exhale).  Having surgery if other treatments do not work. During surgery, excess tissue is removed to create a wider airway. It is important to get treatment  for sleep apnea. Without treatment, this condition can lead to:  High blood pressure.  Coronary artery disease.  In men, an inability to achieve or maintain an erection (impotence).  Reduced thinking abilities. Follow these instructions at home: Lifestyle  Make any lifestyle changes that your health  care provider recommends.  Eat a healthy, well-balanced diet.  Take steps to lose weight if you are overweight.  Avoid using depressants, including alcohol, sedatives, and narcotics.  Do not use any products that contain nicotine or tobacco, such as cigarettes, e-cigarettes, and chewing tobacco. If you need help quitting, ask your health care provider. General instructions  Take over-the-counter and prescription medicines only as told by your health care provider.  If you were given a device to open your airway while you sleep, use it only as told by your health care provider.  If you are having surgery, make sure to tell your health care provider you have sleep apnea. You may need to bring your device with you.  Keep all follow-up visits as told by your health care provider. This is important. Contact a health care provider if:  The device that you received to open your airway during sleep is uncomfortable or does not seem to be working.  Your symptoms do not improve.  Your symptoms get worse. Get help right away if:  You develop: ? Chest pain. ? Shortness of breath. ? Discomfort in your back, arms, or stomach.  You have: ? Trouble speaking. ? Weakness on one side of your body. ? Drooping in your face. These symptoms may represent a serious problem that is an emergency. Do not wait to see if the symptoms will go away. Get medical help right away. Call your local emergency services (911 in the [place] Do not drive yourself to the hospital. Summary  Sleep apnea is a condition in which breathing pauses or becomes shallow during sleep.  The most common cause is a collapsed or blocked airway.  The goal of treatment is to restore normal breathing and to ease symptoms during sleep. This information is not intended to replace advice given to you by your health care provider. Make sure you discuss any questions you have with your health care provider. Document Revised: 06/21/2018  Document Reviewed: 08/30/2017 Elsevier Patient Education  [place].

## 2019-05-30 ENCOUNTER — Telehealth: Payer: Self-pay | Admitting: Internal Medicine

## 2019-05-30 NOTE — Progress Notes (Signed)
  Chronic Care Management   Outreach Note  05/30/2019 Name: Rebecca Elliott MRN: [id number] DOB: 1926-08-23  Referred by: Colon Branch, MD Reason for referral : No chief complaint on file.   An unsuccessful telephone outreach was attempted today. The patient was referred to the pharmacist for assistance with care management and care coordination.   This note is not being shared with the patient for the following reason: To respect privacy (The patient or proxy has requested that the information not be shared).  Follow Up Plan:   Earney Hamburg [employer] Scheduler

## 2019-05-31 ENCOUNTER — Other Ambulatory Visit: Payer: Self-pay

## 2019-05-31 ENCOUNTER — Ambulatory Visit: Payer: Medicare Other

## 2019-05-31 DIAGNOSIS — G4733 Obstructive sleep apnea (adult) (pediatric): Secondary | ICD-10-CM | POA: Diagnosis not present

## 2019-06-04 ENCOUNTER — Encounter (INDEPENDENT_AMBULATORY_CARE_PROVIDER_SITE_OTHER): Payer: Medicare Other | Admitting: Ophthalmology

## 2019-06-06 ENCOUNTER — Telehealth: Payer: Self-pay | Admitting: Internal Medicine

## 2019-06-06 NOTE — Progress Notes (Signed)
°  Chronic Care Management   Outreach Note  06/06/2019 Name: LAWSYN HEILER MRN: [id number] DOB: 04/01/1926  Referred by: Colon Branch, MD Reason for referral : No chief complaint on file.   An unsuccessful telephone outreach was attempted today. The patient was referred to the pharmacist for assistance with care management and care coordination.   This note is not being shared with the patient for the following reason: To respect privacy (The patient or proxy has requested that the information not be shared).   Follow Up Plan:   Earney Hamburg [employer] Scheduler

## 2019-06-07 ENCOUNTER — Telehealth: Payer: Self-pay | Admitting: Pulmonary Disease

## 2019-06-07 DIAGNOSIS — G4733 Obstructive sleep apnea (adult) (pediatric): Secondary | ICD-10-CM

## 2019-06-07 NOTE — Telephone Encounter (Signed)
Call patient  Sleep study result  Date of study: 05/31/2019   Impression: Moderate obstructive sleep apnea Moderate oxygen desaturations  Recommendation: DME referral  CPAP therapy for moderate obstructive sleep apnea Auto titrating CPAP with pressure settings of 5-20 will be appropriate  Follow-up in 4 to 6 weeks following initiation of treatment

## 2019-06-08 NOTE — Telephone Encounter (Signed)
Called and left message for patient's [name]), approved on release of information form.

## 2019-06-11 ENCOUNTER — Telehealth: Payer: Self-pay | Admitting: Pulmonary Disease

## 2019-06-11 NOTE — Telephone Encounter (Signed)
Please see 06/07/2019 phone note.    Results have been shared with [name] at 9:32 this morning.  Nothing further needed at this time- will close encounter.

## 2019-06-11 NOTE — Addendum Note (Signed)
Addended by: Robbie Louis on: 06/11/2019 09:38 AM   Modules accepted: Orders

## 2019-06-11 NOTE — Telephone Encounter (Signed)
Dr. Ander Slade has reviewed the home sleep test this showed moderate obstructive sleep apnea and moderate oxygen desaturations.  Recommendations   Recommend CPAP therapy for moderate obstructive sleep apnea Auto titrating CPAP with pressure settings of 5-20 cm H2O Close clinical follow up with compliance monitorig to optimize therapeutic efficiency Weight loss measures encouraged She should be cautioned against driving when sleepy and against medications with sedative side effects.  Elinor Parkinson, patient's daughter, found on release of information form, informed of results from home sleep study. Family ready to proceed with CPAP therapy. Orders placed for new CPAP machine and follow up recall placed. Ms. Mcgahee verbalized understanding of results and ongoing plan of care.

## 2019-06-12 ENCOUNTER — Encounter (INDEPENDENT_AMBULATORY_CARE_PROVIDER_SITE_OTHER): Payer: Self-pay | Admitting: Ophthalmology

## 2019-06-12 ENCOUNTER — Other Ambulatory Visit: Payer: Self-pay

## 2019-06-12 ENCOUNTER — Ambulatory Visit (INDEPENDENT_AMBULATORY_CARE_PROVIDER_SITE_OTHER): Payer: Medicare Other | Admitting: Ophthalmology

## 2019-06-12 DIAGNOSIS — H34832 Tributary (branch) retinal vein occlusion, left eye, with macular edema: Secondary | ICD-10-CM

## 2019-06-12 DIAGNOSIS — H35352 Cystoid macular degeneration, left eye: Secondary | ICD-10-CM

## 2019-06-12 MED ORDER — BEVACIZUMAB CHEMO INJECTION 1.25MG/0.05ML SYRINGE FOR KALEIDOSCOPE
1.2500 mg | INTRAVITREAL | Status: AC | PRN
  Administered 2019-06-12: 1.25 mg via INTRAVITREAL

## 2019-06-12 NOTE — Progress Notes (Signed)
06/12/2019     CHIEF COMPLAINT Patient presents for Retina Follow Up   HISTORY OF PRESENT ILLNESS: Rebecca Elliott is a 86 y.o. female who presents to the clinic today for:   HPI    Retina Follow Up    Patient presents with  Other.  In left eye.  This started 7 weeks ago.  Severity is moderate.  Duration of 7 weeks.  Since onset it is gradually worsening.          Comments    7 Week F/UU OU, no dilation  Pt reports she had sleep apnea testing and requires use of CPAP due to moderate sleep apnea. Pt sts she has not received CPAP machine yet. Pt c/o worsening VA OS.       Last edited by Rockie Neighbours, [place] on 06/12/2019  2:47 PM. (History)      Referring physician: Colon Branch, MD [address] [place],  [place] [address]  HISTORICAL INFORMATION:   Selected notes from the MEDICAL RECORD NUMBER    Lab Results  Component Value Date   HGBA1C 7.3 (H) 04/13/2019     CURRENT MEDICATIONS: No current outpatient medications on file. (Ophthalmic Drugs)   No current facility-administered medications for this visit. (Ophthalmic Drugs)   Current Outpatient Medications (Other)  Medication Sig  . acetaminophen (TYLENOL) 500 MG tablet Take 500 mg by mouth as needed.  Marland Kitchen albuterol (PROAIR HFA) 108 (90 Base) MCG/ACT inhaler Inhale 2 puffs into the lungs every 6 (six) hours as needed for wheezing or shortness of breath.  Marland Kitchen amLODipine (NORVASC) 10 MG tablet Take 1 tablet (10 mg total) by mouth daily.  Marland Kitchen aspirin 81 MG tablet Take 81 mg by mouth daily.   Marland Kitchen atorvastatin (LIPITOR) 10 MG tablet Take 1 tablet (10 mg total) by mouth daily.  Marland Kitchen dextromethorphan-guaiFENesin (MUCINEX DM) 30-600 MG per 12 hr tablet Take 1 tablet by mouth daily.    . diclofenac sodium (VOLTAREN) 1 % GEL Apply 2 g topically 4 (four) times daily.  Marland Kitchen docusate sodium (COLACE) 100 MG capsule Take 1-2 by mouth once a day.   . famotidine (PEPCID) 20 MG tablet Take 1 tablet (20 mg total) by mouth 2 (two) times  daily.  . Fluticasone-Umeclidin-Vilant (TRELEGY ELLIPTA) 100-62.5-25 MCG/INH AEPB Inhale 1 Inhaler into the lungs daily.  . hydrocortisone 2.5 % cream Apply topically 2 (two) times daily.  . Lidocaine 3 % CREA Apply 1 application topically 3 (three) times daily as needed.  . loratadine (CLARITIN) 10 MG tablet Take 10 mg by mouth daily.    Marland Kitchen losartan-hydrochlorothiazide (HYZAAR) 100-12.5 MG tablet Take 1 tablet by mouth daily.  . metoprolol succinate (TOPROL-XL) 25 MG 24 hr tablet Take 1.5 tablets (37.5 mg total) by mouth daily.  . mometasone-formoterol (DULERA) 100-5 MCG/ACT AERO Inhale 2 puffs into the lungs in the morning and at bedtime.  . Multiple Vitamins-Minerals (CENTRUM SILVER) CHEW Chew by mouth.    . NONFORMULARY OR COMPOUNDED ITEM Compression Stockings Dx: M79.89  . pantoprazole (PROTONIX) 40 MG tablet Take 1 tablet (40 mg total) by mouth every other day.  . sitaGLIPtin (JANUVIA) 50 MG tablet Take 1 tablet (50 mg total) by mouth daily.  Marland Kitchen umeclidinium bromide (INCRUSE ELLIPTA) 62.5 MCG/INH AEPB Inhale 1 puff into the lungs daily at 12 noon.  . Wheat Dextrin (BENEFIBER DRINK MIX PO) Take by mouth 2 (two) times daily.     No current facility-administered medications for this visit. (Other)  REVIEW OF SYSTEMS:    ALLERGIES No Known Allergies  PAST MEDICAL HISTORY Past Medical History:  Diagnosis Date  . Allergic rhinitis   . COPD  and ASTHMA   . Depression   . Diabetes mellitus    as an adult  . DJD (degenerative joint disease)   . GERD (gastroesophageal reflux disease)    and HH w/ reflux  . History of colonic polyps   . Hyperlipidemia   . Hypertension    Past Surgical History:  Procedure Laterality Date  . ABDOMINAL HYSTERECTOMY     apparently no oophorectomy  . APPENDECTOMY    . bilateral breast tumor    . BREAST EXCISIONAL BIOPSY Bilateral    one in her 20's and one in her 50's, both benign  . CATARACT EXTRACTION Bilateral    [place]  . CATARACT  EXTRACTION, BILATERAL    . CHOLECYSTECTOMY    . TONSILLECTOMY    . TUBAL LIGATION      FAMILY HISTORY Family History  Problem Relation Age of Onset  . Breast cancer Sister        x 2  . Diabetes Father   . Diabetes Sister   . Heart attack Brother        early 18s  . Heart attack Son   . Pancreatic cancer Son   . Stomach cancer Sister   . Colon cancer Neg Hx     SOCIAL HISTORY Social History   Tobacco Use  . Smoking status: Former Smoker    Packs/day: 1.00    Years: 35.00    Pack years: 35.00    Types: Cigarettes    Quit date: 01/18/1993    Years since quitting: 26.4  . Smokeless tobacco: Never Used  Substance Use Topics  . Alcohol use: No    Alcohol/week: 0.0 standard drinks  . Drug use: No         OPHTHALMIC EXAM:  Base Eye Exam    Visual Acuity (ETDRS)      Right Left   Dist cc 20/25 -1 20/200   Dist ph cc  20/100 -3   Correction: Glasses       Tonometry (Tonopen, 2:48 PM)      Right Left   Pressure 12 10       Pupils      Pupils Dark Light Shape React APD   Right PERRL 4 3 Round Brisk None   Left PERRL 4 3 Round Brisk None       Visual Fields (Counting fingers)      Left Right    Full Full       Extraocular Movement      Right Left    Full Full       Neuro/Psych    Oriented x3: Yes   Mood/Affect: Normal       Dilation   Deferred OU        Slit Lamp and Fundus Exam    External Exam      Right Left   External Normal Normal       Slit Lamp Exam      Right Left   Lids/Lashes Normal Normal   Conjunctiva/Sclera White and quiet White and quiet   Cornea Clear Clear   Anterior Chamber Deep and quiet Deep and quiet   Iris Round and reactive Round and reactive   Lens Posterior chamber intraocular lens Posterior chamber intraocular lens   Anterior Vitreous Normal Normal  IMAGING AND PROCEDURES  Imaging and Procedures for 06/12/19  OCT, Retina - OU - Both Eyes       Right Eye Quality was good. Scan locations  included subfoveal. Central Foveal Thickness: 286. Findings include abnormal foveal contour, retinal drusen .   Left Eye Quality was good. Scan locations included subfoveal. Central Foveal Thickness: 628. Progression has worsened. Findings include cystoid macular edema.   Notes OS, increased retinal thickening, CME and an inferotemporal pattern along the inferotemporal retinal vein.  This is likely progression of her branch retinal vein occlusion with secondary CME.  For this reason we will commence with therapy today, intravitreal Avastin OS       Intravitreal Injection, Pharmacologic Agent - OS - Left Eye       Time Out 06/12/2019. 3:48 PM. Confirmed correct patient, procedure, site, and patient consented.   Anesthesia Topical anesthesia was used. Anesthetic medications included Akten 3.5%.   Procedure Preparation included Tobramycin 0.3%, 10% betadine to eyelids. A 30 gauge needle was used.   Injection:  1.25 mg Bevacizumab (AVASTIN) SOLN   NDC: [address]-1, Lot: [id number]   Route: Intravitreal, Site: Left Eye, Waste: 0 mg  Post-op Post injection exam found visual acuity of at least counting fingers. The patient tolerated the procedure well. There were no complications. The patient received written and verbal post procedure care education. Post injection medications were not given.                 ASSESSMENT/PLAN:  No problem-specific Assessment & Plan notes found for this encounter.      ICD-10-CM   1. Branch retinal vein occlusion with macular edema of left eye  H34.8320 Intravitreal Injection, Pharmacologic Agent - OS - Left Eye    Bevacizumab (AVASTIN) SOLN 1.25 mg  2. Cystoid macular edema of left eye  H35.352 OCT, Retina - OU - Both Eyes    1.OS, increased retinal thickening, CME and an inferotemporal pattern along the inferotemporal retinal vein.  This is likely progression of her branch retinal vein occlusion with secondary CME.  For this reason we will  commence with therapy today, intravitreal Avastin OS  2.  3.  Ophthalmic Meds Ordered this visit:  Meds ordered this encounter  Medications  . Bevacizumab (AVASTIN) SOLN 1.25 mg       Return in about 5 weeks (around 07/17/2019) for OS, dilate, AVASTIN OCT.  There are no Patient Instructions on file for this visit.   Explained the diagnoses, plan, and follow up with the patient and they expressed understanding.  Patient expressed understanding of the importance of proper follow up care.   Clent Demark Beverlyann Broxterman M.D. Diseases & Surgery of the Retina and Vitreous Retina [facility] Diabetic [facility] 06/12/19     Abbreviations: M myopia (nearsighted); A astigmatism; H hyperopia (farsighted); P presbyopia; Mrx spectacle prescription;  CTL contact lenses; OD right eye; OS left eye; OU both eyes  XT exotropia; ET esotropia; PEK punctate epithelial keratitis; PEE punctate epithelial erosions; DES dry eye syndrome; MGD meibomian gland dysfunction; ATs artificial tears; PFAT's preservative free artificial tears; [place] nuclear sclerotic cataract; PSC posterior subcapsular cataract; ERM epi-retinal membrane; PVD posterior vitreous detachment; RD retinal detachment; DM diabetes mellitus; DR diabetic retinopathy; NPDR non-proliferative diabetic retinopathy; PDR proliferative diabetic retinopathy; CSME clinically significant macular edema; DME diabetic macular edema; dbh dot blot hemorrhages; CWS cotton wool spot; POAG primary open angle glaucoma; C/D cup-to-disc ratio; HVF humphrey visual field; GVF goldmann visual field; OCT optical coherence tomography; IOP intraocular pressure;  BRVO Branch retinal vein occlusion; CRVO central retinal vein occlusion; CRAO central retinal artery occlusion; BRAO branch retinal artery occlusion; RT retinal tear; SB scleral buckle; PPV pars plana vitrectomy; VH Vitreous hemorrhage; PRP panretinal laser photocoagulation; IVK intravitreal kenalog; VMT vitreomacular traction; MH Macular  hole;  NVD neovascularization of the disc; NVE neovascularization elsewhere; AREDS age related eye disease study; ARMD age related macular degeneration; POAG primary open angle glaucoma; EBMD epithelial/anterior basement membrane dystrophy; ACIOL anterior chamber intraocular lens; IOL intraocular lens; PCIOL posterior chamber intraocular lens; Phaco/IOL phacoemulsification with intraocular lens placement; [place] photorefractive keratectomy; LASIK laser assisted in situ keratomileusis; HTN hypertension; DM diabetes mellitus; COPD chronic obstructive pulmonary disease

## 2019-06-14 ENCOUNTER — Telehealth: Payer: Self-pay | Admitting: Internal Medicine

## 2019-06-14 NOTE — Progress Notes (Signed)
  Chronic Care Management   Outreach Note  06/14/2019 Name: Rebecca Elliott MRN: [id number] DOB: Apr 12, 1926  Referred by: Colon Branch, MD Reason for referral : No chief complaint on file.   An unsuccessful telephone outreach was attempted today. The patient was referred to the pharmacist for assistance with care management and care coordination.   This note is not being shared with the patient for the following reason: To respect privacy (The patient or proxy has requested that the information not be shared).  Follow Up Plan:   Earney Hamburg Upstream Scheduler

## 2019-06-27 ENCOUNTER — Telehealth: Payer: Self-pay | Admitting: Internal Medicine

## 2019-06-27 NOTE — Progress Notes (Signed)
  Chronic Care Management   Note  06/27/2019 Name: Rebecca Elliott MRN: [id number] DOB: [birth year]  Rebecca Elliott is a 86 y.o. year old female who is a primary care patient of Colon Branch, MD. I reached out to Gardiner Ramus by phone today in response to a referral sent by [name] PCP, Colon Branch, MD.   [name] was given information about Chronic Care Management services today including:  1. CCM service includes personalized support from designated clinical staff supervised by her physician, including individualized plan of care and coordination with other care providers 2. 24/7 contact phone numbers for assistance for urgent and routine care needs. 3. Service will only be billed when office clinical staff spend 20 minutes or more in a month to coordinate care. 4. Only one practitioner may furnish and bill the service in a calendar month. 5. The patient may stop CCM services at any time (effective at the end of the month) by phone call to the office staff.   Patient agreed to services and verbal consent obtained.   This note is not being shared with the patient for the following reason: To respect privacy (The patient or proxy has requested that the information not be shared).  Follow up plan:   Earney Hamburg Upstream Scheduler

## 2019-07-17 ENCOUNTER — Other Ambulatory Visit: Payer: Self-pay

## 2019-07-17 ENCOUNTER — Ambulatory Visit (INDEPENDENT_AMBULATORY_CARE_PROVIDER_SITE_OTHER): Payer: Medicare Other | Admitting: Ophthalmology

## 2019-07-17 ENCOUNTER — Encounter (INDEPENDENT_AMBULATORY_CARE_PROVIDER_SITE_OTHER): Payer: Self-pay | Admitting: Ophthalmology

## 2019-07-17 DIAGNOSIS — H34832 Tributary (branch) retinal vein occlusion, left eye, with macular edema: Secondary | ICD-10-CM | POA: Diagnosis not present

## 2019-07-17 DIAGNOSIS — H353221 Exudative age-related macular degeneration, left eye, with active choroidal neovascularization: Secondary | ICD-10-CM | POA: Insufficient documentation

## 2019-07-17 MED ORDER — BEVACIZUMAB CHEMO INJECTION 1.25MG/0.05ML SYRINGE FOR KALEIDOSCOPE
1.2500 mg | INTRAVITREAL | Status: AC | PRN
  Administered 2019-07-17: 1.25 mg via INTRAVITREAL

## 2019-07-17 NOTE — Progress Notes (Signed)
07/17/2019     CHIEF COMPLAINT Patient presents for Retina Follow Up   HISTORY OF PRESENT ILLNESS: Rebecca Elliott is a 86 y.o. female who presents to the clinic today for:   HPI    Retina Follow Up    Patient presents with  CRVO/BRVO.  In left eye.  Duration of 5 weeks.  Since onset it is stable.          Comments    5 week follow up - OCT OU, Poss Avastin OS Patient denies change in vision and overall has no complaints. **Patients daughter states that she has been having problems with her right eye        Last edited by Gerda Diss on 07/17/2019  1:54 PM. (History)      Referring physician: Colon Branch, MD [address] [place],  [place] [address]  HISTORICAL INFORMATION:   Selected notes from the MEDICAL RECORD NUMBER    Lab Results  Component Value Date   HGBA1C 7.3 (H) 04/13/2019     CURRENT MEDICATIONS: No current outpatient medications on file. (Ophthalmic Drugs)   No current facility-administered medications for this visit. (Ophthalmic Drugs)   Current Outpatient Medications (Other)  Medication Sig  . acetaminophen (TYLENOL) 500 MG tablet Take 500 mg by mouth as needed.  Marland Kitchen albuterol (PROAIR HFA) 108 (90 Base) MCG/ACT inhaler Inhale 2 puffs into the lungs every 6 (six) hours as needed for wheezing or shortness of breath.  Marland Kitchen amLODipine (NORVASC) 10 MG tablet Take 1 tablet (10 mg total) by mouth daily.  Marland Kitchen aspirin 81 MG tablet Take 81 mg by mouth daily.   Marland Kitchen atorvastatin (LIPITOR) 10 MG tablet Take 1 tablet (10 mg total) by mouth daily.  Marland Kitchen dextromethorphan-guaiFENesin (MUCINEX DM) 30-600 MG per 12 hr tablet Take 1 tablet by mouth daily.    . diclofenac sodium (VOLTAREN) 1 % GEL Apply 2 g topically 4 (four) times daily.  Marland Kitchen docusate sodium (COLACE) 100 MG capsule Take 1-2 by mouth once a day.   . famotidine (PEPCID) 20 MG tablet Take 1 tablet (20 mg total) by mouth 2 (two) times daily.  . Fluticasone-Umeclidin-Vilant (TRELEGY ELLIPTA)  100-62.5-25 MCG/INH AEPB Inhale 1 Inhaler into the lungs daily.  . hydrocortisone 2.5 % cream Apply topically 2 (two) times daily.  . Lidocaine 3 % CREA Apply 1 application topically 3 (three) times daily as needed.  . loratadine (CLARITIN) 10 MG tablet Take 10 mg by mouth daily.    Marland Kitchen losartan-hydrochlorothiazide (HYZAAR) 100-12.5 MG tablet Take 1 tablet by mouth daily.  . metoprolol succinate (TOPROL-XL) 25 MG 24 hr tablet Take 1.5 tablets (37.5 mg total) by mouth daily.  . mometasone-formoterol (DULERA) 100-5 MCG/ACT AERO Inhale 2 puffs into the lungs in the morning and at bedtime.  . Multiple Vitamins-Minerals (CENTRUM SILVER) CHEW Chew by mouth.    . NONFORMULARY OR COMPOUNDED ITEM Compression Stockings Dx: M79.89  . pantoprazole (PROTONIX) 40 MG tablet Take 1 tablet (40 mg total) by mouth every other day.  . sitaGLIPtin (JANUVIA) 50 MG tablet Take 1 tablet (50 mg total) by mouth daily.  Marland Kitchen umeclidinium bromide (INCRUSE ELLIPTA) 62.5 MCG/INH AEPB Inhale 1 puff into the lungs daily at 12 noon.  . Wheat Dextrin (BENEFIBER DRINK MIX PO) Take by mouth 2 (two) times daily.     No current facility-administered medications for this visit. (Other)      REVIEW OF SYSTEMS:    ALLERGIES No Known Allergies  PAST MEDICAL HISTORY Past Medical History:  Diagnosis Date  . Allergic rhinitis   . COPD  and ASTHMA   . Depression   . Diabetes mellitus    as an adult  . DJD (degenerative joint disease)   . GERD (gastroesophageal reflux disease)    and HH w/ reflux  . History of colonic polyps   . Hyperlipidemia   . Hypertension    Past Surgical History:  Procedure Laterality Date  . ABDOMINAL HYSTERECTOMY     apparently no oophorectomy  . APPENDECTOMY    . bilateral breast tumor    . BREAST EXCISIONAL BIOPSY Bilateral    one in her 20's and one in her 50's, both benign  . CATARACT EXTRACTION Bilateral    [place]  . CATARACT EXTRACTION, BILATERAL    . CHOLECYSTECTOMY    .  TONSILLECTOMY    . TUBAL LIGATION      FAMILY HISTORY Family History  Problem Relation Age of Onset  . Breast cancer Sister        x 2  . Diabetes Father   . Diabetes Sister   . Heart attack Brother        early 34s  . Heart attack Son   . Pancreatic cancer Son   . Stomach cancer Sister   . Colon cancer Neg Hx     SOCIAL HISTORY Social History   Tobacco Use  . Smoking status: Former Smoker    Packs/day: 1.00    Years: 35.00    Pack years: 35.00    Types: Cigarettes    Quit date: 01/18/1993    Years since quitting: 26.5  . Smokeless tobacco: Never Used  Substance Use Topics  . Alcohol use: No    Alcohol/week: 0.0 standard drinks  . Drug use: No         OPHTHALMIC EXAM:  Base Eye Exam    Visual Acuity (Snellen - Linear)      Right Left   Dist cc 20/25-2 20/100-1       Tonometry (Tonopen, 1:56 PM)      Right Left   Pressure 15 20       Pupils      Pupils Dark Light Shape React APD   Right PERRL 4 3 Round Sluggish None   Left PERRL 4 3 Round Sluggish None       Visual Fields (Counting fingers)      Left Right    Full Full       Extraocular Movement      Right Left    Full Full       Neuro/Psych    Oriented x3: Yes   Mood/Affect: Normal       Dilation    Left eye: 1.0% Mydriacyl, 2.5% Phenylephrine @ 1:56 PM        Slit Lamp and Fundus Exam    External Exam      Right Left   External Normal Normal       Slit Lamp Exam      Right Left   Lids/Lashes Normal Normal   Conjunctiva/Sclera White and quiet White and quiet   Cornea Clear Clear   Anterior Chamber Deep and quiet Deep and quiet   Iris Round and reactive Round and reactive   Lens Posterior chamber intraocular lens Posterior chamber intraocular lens   Anterior Vitreous Normal Normal       Fundus Exam      Right Left   Posterior Vitreous  Posterior vitreous detachment   Disc  Normal   C/D Ratio  0.1   Macula  Hard drusen, Intraretinal hemorrhage, Macular thickening,  Retinal pigment epithelial mottling, Cystoid macular edema, Early age related macular degeneration   Vessels  Normal   Periphery  Normal          IMAGING AND PROCEDURES  Imaging and Procedures for 07/17/19  OCT, Retina - OU - Both Eyes       Right Eye Quality was good. Scan locations included subfoveal. Central Foveal Thickness: 272. Progression has been stable. Findings include retinal drusen , no IRF, no SRF.   Left Eye Quality was good. Scan locations included subfoveal. Central Foveal Thickness: 459. Progression has improved. Findings include intraretinal fluid, subretinal fluid, subretinal scarring.   Notes OS vastly improved status post intravitreal Avastin No. 1.  We will repeat injection OS today, and repeat visit in 5 weeks       Intravitreal Injection, Pharmacologic Agent - OS - Left Eye       Time Out 07/17/2019. 2:45 PM. Confirmed correct patient, procedure, site, and patient consented.   Anesthesia Topical anesthesia was used. Anesthetic medications included Akten 3.5%.   Procedure Preparation included Tobramycin 0.3%, 10% betadine to eyelids, 5% betadine to ocular surface. A 30 gauge needle was used.   Injection:  1.25 mg Bevacizumab (AVASTIN) SOLN   NDC: [address]-7, Lot: [id number]   Route: Intravitreal, Site: Left Eye, Waste: 0 mg  Post-op Post injection exam found visual acuity of at least counting fingers. The patient tolerated the procedure well. There were no complications. The patient received written and verbal post procedure care education. Post injection medications were not given.                 ASSESSMENT/PLAN:  No problem-specific Assessment & Plan notes found for this encounter.      ICD-10-CM   1. Exudative age-related macular degeneration of left eye with active choroidal neovascularization (HCC)  H35.3221 OCT, Retina - OU - Both Eyes    Intravitreal Injection, Pharmacologic Agent - OS - Left Eye    Bevacizumab (AVASTIN) SOLN  1.25 mg  2. Branch retinal vein occlusion with macular edema of left eye  H34.8320     1.OS vastly improved status post intravitreal Avastin No. 1.  We will repeat injection OS today, and repeat visit in 5 weeks  2.  3.  Ophthalmic Meds Ordered this visit:  Meds ordered this encounter  Medications  . Bevacizumab (AVASTIN) SOLN 1.25 mg       Return in about 5 weeks (around 08/21/2019) for dilate, OS, AVASTIN OCT.  There are no Patient Instructions on file for this visit.   Explained the diagnoses, plan, and follow up with the patient and they expressed understanding.  Patient expressed understanding of the importance of proper follow up care.   Clent Demark Cortana Vanderford M.D. Diseases & Surgery of the Retina and Vitreous Retina & Diabetic [facility] 07/17/19     Abbreviations: M myopia (nearsighted); A astigmatism; H hyperopia (farsighted); P presbyopia; Mrx spectacle prescription;  CTL contact lenses; OD right eye; OS left eye; OU both eyes  XT exotropia; ET esotropia; PEK punctate epithelial keratitis; PEE punctate epithelial erosions; DES dry eye syndrome; MGD meibomian gland dysfunction; ATs artificial tears; PFAT's preservative free artificial tears; [place] nuclear sclerotic cataract; PSC posterior subcapsular cataract; ERM epi-retinal membrane; PVD posterior vitreous detachment; RD retinal detachment; DM diabetes mellitus; DR diabetic retinopathy; NPDR non-proliferative diabetic retinopathy; PDR proliferative diabetic retinopathy;  CSME clinically significant macular edema; DME diabetic macular edema; dbh dot blot hemorrhages; CWS cotton wool spot; POAG primary open angle glaucoma; C/D cup-to-disc ratio; HVF humphrey visual field; GVF goldmann visual field; OCT optical coherence tomography; IOP intraocular pressure; BRVO Branch retinal vein occlusion; CRVO central retinal vein occlusion; CRAO central retinal artery occlusion; BRAO branch retinal artery occlusion; RT retinal tear; SB scleral  buckle; PPV pars plana vitrectomy; VH Vitreous hemorrhage; PRP panretinal laser photocoagulation; IVK intravitreal kenalog; VMT vitreomacular traction; MH Macular hole;  NVD neovascularization of the disc; NVE neovascularization elsewhere; AREDS age related eye disease study; ARMD age related macular degeneration; POAG primary open angle glaucoma; EBMD epithelial/anterior basement membrane dystrophy; ACIOL anterior chamber intraocular lens; IOL intraocular lens; PCIOL posterior chamber intraocular lens; Phaco/IOL phacoemulsification with intraocular lens placement; [place] photorefractive keratectomy; LASIK laser assisted in situ keratomileusis; HTN hypertension; DM diabetes mellitus; COPD chronic obstructive pulmonary disease

## 2019-08-01 ENCOUNTER — Ambulatory Visit (INDEPENDENT_AMBULATORY_CARE_PROVIDER_SITE_OTHER): Payer: Medicare Other | Admitting: Internal Medicine

## 2019-08-01 ENCOUNTER — Encounter: Payer: Self-pay | Admitting: Internal Medicine

## 2019-08-01 ENCOUNTER — Other Ambulatory Visit: Payer: Self-pay

## 2019-08-01 VITALS — BP 132/48 | HR 70 | Temp 98.1°F | Resp 18 | Ht 66.0 in | Wt 220.4 lb

## 2019-08-01 DIAGNOSIS — J449 Chronic obstructive pulmonary disease, unspecified: Secondary | ICD-10-CM

## 2019-08-01 DIAGNOSIS — I10 Essential (primary) hypertension: Secondary | ICD-10-CM

## 2019-08-01 DIAGNOSIS — E119 Type 2 diabetes mellitus without complications: Secondary | ICD-10-CM

## 2019-08-01 LAB — BASIC METABOLIC PANEL
BUN: 24 mg/dL — ABNORMAL HIGH (ref 6–23)
CO2: 32 mEq/L (ref 19–32)
Calcium: 9 mg/dL (ref 8.4–10.5)
Chloride: 101 mEq/L (ref 96–112)
Creatinine, Ser: 1.52 mg/dL — ABNORMAL HIGH (ref 0.40–1.20)
GFR: 32.02 mL/min — ABNORMAL LOW (ref >60.00)
Glucose, Bld: 173 mg/dL — ABNORMAL HIGH (ref 70–99)
Potassium: 3.7 mEq/L (ref 3.5–5.1)
Sodium: 140 mEq/L (ref 135–145)

## 2019-08-01 LAB — HEMOGLOBIN A1C: Hgb A1c MFr Bld: 7.7 % — ABNORMAL HIGH (ref 4.6–6.5)

## 2019-08-01 NOTE — Patient Instructions (Addendum)
Please schedule Medicare Wellness with Glenard Haring.   Check the  blood pressure 2 or 3 times a week BP GOAL is between 110/65 and  135/85. If it is consistently higher or lower, let me know  . GO TO THE LAB : Get the blood work     [place], [place] back for   a checkup in 4 months

## 2019-08-01 NOTE — Progress Notes (Signed)
Subjective:    Patient ID: Rebecca Elliott, female    DOB: [birth year], 86 y.o.   MRN: [id number]  DOS:  08/01/2019 Type of visit - description: rov, here with her daughter Today we talk about hypertension, COPD asthma, diabetes.   BP Readings from Last 3 Encounters:  08/01/19 (!) 160/76  05/23/19 138/86  05/02/19 (!) 155/52     Review of Systems Good med compliance, no recent ambulatory BPs. Although she coughs at least once daily, it has improved compared to previous months.  Still has some mucus most days, the sputum is clear to  yellow.  No hemoptysis.   Past Medical History:  Diagnosis Date  . Allergic rhinitis   . COPD  and ASTHMA   . Depression   . Diabetes mellitus    as an adult  . DJD (degenerative joint disease)   . GERD (gastroesophageal reflux disease)    and HH w/ reflux  . History of colonic polyps   . Hyperlipidemia   . Hypertension     Past Surgical History:  Procedure Laterality Date  . ABDOMINAL HYSTERECTOMY     apparently no oophorectomy  . APPENDECTOMY    . bilateral breast tumor    . BREAST EXCISIONAL BIOPSY Bilateral    one in her 20's and one in her 50's, both benign  . CATARACT EXTRACTION Bilateral    Gro[place]  . CATARACT EXTRACTION, BILATERAL    . CHOLECYSTECTOMY    . TONSILLECTOMY    . TUBAL LIGATION      Allergies as of 08/01/2019   No Known Allergies     Medication List       Accurate as of August 01, 2019 10:57 AM. If you have any questions, ask your nurse or doctor.        albuterol 108 (90 Base) MCG/ACT inhaler Commonly known as: ProAir HFA Inhale 2 puffs into the lungs every 6 (six) hours as needed for wheezing or shortness of breath.   amLODipine 10 MG tablet Commonly known as: NORVASC Take 1 tablet (10 mg total) by mouth daily.   aspirin 81 MG tablet Take 81 mg by mouth daily.   atorvastatin 10 MG tablet Commonly known as: LIPITOR Take 1 tablet (10 mg total) by mouth daily.   BENEFIBER DRINK MIX PO Take by  mouth 2 (two) times daily.   Centrum Silver Chew Chew by mouth.   dextromethorphan-guaiFENesin 30-600 MG 12hr tablet Commonly known as: MUCINEX DM Take 1 tablet by mouth daily.   diclofenac sodium 1 % Gel Commonly known as: VOLTAREN Apply 2 g topically 4 (four) times daily.   docusate sodium 100 MG capsule Commonly known as: COLACE Take 1-2 by mouth once a day.   Dulera 100-5 MCG/ACT Aero Generic drug: mometasone-formoterol Inhale 2 puffs into the lungs in the morning and at bedtime.   famotidine 20 MG tablet Commonly known as: PEPCID Take 1 tablet (20 mg total) by mouth 2 (two) times daily.   Incruse Ellipta 62.5 MCG/INH Aepb Generic drug: umeclidinium bromide Inhale 1 puff into the lungs daily at 12 noon.   Lidocaine 3 % Crea Apply 1 application topically 3 (three) times daily as needed.   loratadine 10 MG tablet Commonly known as: CLARITIN Take 10 mg by mouth daily.   losartan-hydrochlorothiazide 100-12.5 MG tablet Commonly known as: HYZAAR Take 1 tablet by mouth daily.   metoprolol succinate 25 MG 24 hr tablet Commonly known as: TOPROL-XL Take 1.5 tablets (37.5 mg total) by mouth  daily.   NONFORMULARY OR COMPOUNDED ITEM Compression Stockings Dx: M79.89   pantoprazole 40 MG tablet Commonly known as: PROTONIX Take 1 tablet (40 mg total) by mouth every other day.   sitaGLIPtin 50 MG tablet Commonly known as: Januvia Take 1 tablet (50 mg total) by mouth daily.   Trelegy Ellipta 100-62.5-25 MCG/INH Aepb Generic drug: Fluticasone-Umeclidin-Vilant Inhale 1 Inhaler into the lungs daily.   TYLENOL 500 MG tablet Generic drug: acetaminophen Take 500 mg by mouth as needed.          Objective:   Physical Exam BP (!) 160/76 (BP Location: Left Arm, Patient Position: Sitting, Cuff Size: Small)   Pulse 70   Temp 98.1 F (36.7 C) (Oral)   Resp 18   Ht 5\' 6"  (1.676 m)   Wt 220 lb 6 oz (100 kg)   SpO2 96%   BMI 35.57 kg/m  General:   Well developed,  NAD, BMI noted. HEENT:  Normocephalic . Face symmetric, atraumatic Lungs:  CTA B Normal respiratory effort, no intercostal retractions, no accessory muscle use. Heart: RRR,  no murmur.  Lower extremities: no pretibial edema bilaterally  Skin: Not pale. Not jaundice Neurologic:  alert & oriented X3.  Speech normal, gait assisted by a walker Psych--  Cognition and judgment appear intact.  Cooperative with normal attention span and concentration.  Behavior appropriate. No anxious or depressed appearing.  .    Assessment      ASSESSMENT DM--no neuropathy, Metformin DC 04-2015 d/t creatinine HTN  Hyperlipidemia Depression COPD - Asthma Pulmonary nodules per CXR 03-2014, CT 03-2016, stable, no further CTs needed Anemia, chronic, on-off iron def  Colonoscopy 2004: No polyps EGD 2006 for dysphagia showed hiatal hernia, occult  stricture? Colonoscopy 2010 showed diverticuli Dysphagia: Barium swallow showed dysmotility 08-2014 DJD GERD OSA DX 05/2019   PLAN: OSA: Since her last visit, had a sleep study: + OSA, using a CPAP  HTN: on  Hyzaar, metoprolol, amlodipine; BP initially elevated, recheck: 132/48.  Check a BMP, continue same meds, monitor BPs at home. COPD asthma: On albuterol as needed plus  Dulera/Incruse Ellipta.  Has  occasional sputum, extensive discussion with the patient and her daughter about escalated therapy versus no change.  Overall, despite occasional sputum and I think she is doing well.  We agreed on no change.. DM: On  Januvia check A1c Nosebleeds: See last visit, resolved. RTC 4 months  Time spent 30 minutes  This visit occurred during the SARS-CoV-2 public health emergency.  Safety protocols were in place, including screening questions prior to the visit, additional usage of staff PPE, and extensive cleaning of exam room while observing appropriate contact time as indicated for disinfecting solutions.

## 2019-08-01 NOTE — Progress Notes (Signed)
Pre visit review using our clinic review tool, if applicable. No additional management support is needed unless otherwise documented below in the visit note. 

## 2019-08-02 NOTE — Assessment & Plan Note (Addendum)
OSA: Since her last visit, had a sleep study: + OSA, using a CPAP  HTN: on  Hyzaar, metoprolol, amlodipine; BP initially elevated, recheck: 132/48.  Check a BMP, continue same meds, monitor BPs at home. COPD asthma: On albuterol as needed plus  Dulera/Incruse Ellipta.  Has  occasional sputum, extensive discussion with the patient and her daughter about escalated therapy versus no change.  Overall, despite occasional sputum and I think she is doing well.  We agreed on no change.. DM: On  Januvia check A1c Nosebleeds: See last visit, resolved. RTC 4 months

## 2019-08-20 ENCOUNTER — Encounter (INDEPENDENT_AMBULATORY_CARE_PROVIDER_SITE_OTHER): Payer: Self-pay | Admitting: Ophthalmology

## 2019-08-20 ENCOUNTER — Ambulatory Visit (INDEPENDENT_AMBULATORY_CARE_PROVIDER_SITE_OTHER): Payer: Medicare Other | Admitting: Ophthalmology

## 2019-08-20 ENCOUNTER — Other Ambulatory Visit: Payer: Self-pay

## 2019-08-20 DIAGNOSIS — H353221 Exudative age-related macular degeneration, left eye, with active choroidal neovascularization: Secondary | ICD-10-CM | POA: Diagnosis not present

## 2019-08-20 DIAGNOSIS — H34832 Tributary (branch) retinal vein occlusion, left eye, with macular edema: Secondary | ICD-10-CM

## 2019-08-20 MED ORDER — BEVACIZUMAB CHEMO INJECTION 1.25MG/0.05ML SYRINGE FOR KALEIDOSCOPE
1.2500 mg | INTRAVITREAL | Status: AC | PRN
  Administered 2019-08-20: 1.25 mg via INTRAVITREAL

## 2019-08-20 NOTE — Assessment & Plan Note (Signed)
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  We will repeat injection intravitreal Avastin today at 5-week interval and maintain interval of treatment to  5  weeks left eye

## 2019-08-20 NOTE — Progress Notes (Signed)
08/20/2019     CHIEF COMPLAINT Patient presents for Retina Follow Up   HISTORY OF PRESENT ILLNESS: Rebecca Elliott is a 86 y.o. female who presents to the clinic today for:   HPI    Retina Follow Up    Patient presents with  Wet AMD.  In left eye.  This started 5 weeks ago.  Severity is moderate.  Duration of 5 weeks.  Since onset it is stable.          Comments    5 Week AMD F/U OS, poss Avastin OS  Pt sts it is hard to tell if VA OS has changed, but "it might be a little worse." Pt denies any other new symptoms OU.       Last edited by Rockie Neighbours, [place] on 08/20/2019  1:24 PM. (History)      Referring physician: Colon Branch, MD [address] [place],  [place] [address]  HISTORICAL INFORMATION:   Selected notes from the MEDICAL RECORD NUMBER    Lab Results  Component Value Date   HGBA1C 7.7 (H) 08/01/2019     CURRENT MEDICATIONS: No current outpatient medications on file. (Ophthalmic Drugs)   No current facility-administered medications for this visit. (Ophthalmic Drugs)   Current Outpatient Medications (Other)  Medication Sig   acetaminophen (TYLENOL) 500 MG tablet Take 500 mg by mouth as needed.   albuterol (PROAIR HFA) 108 (90 Base) MCG/ACT inhaler Inhale 2 puffs into the lungs every 6 (six) hours as needed for wheezing or shortness of breath.   amLODipine (NORVASC) 10 MG tablet Take 1 tablet (10 mg total) by mouth daily.   aspirin 81 MG tablet Take 81 mg by mouth daily.    atorvastatin (LIPITOR) 10 MG tablet Take 1 tablet (10 mg total) by mouth daily.   dextromethorphan-guaiFENesin (MUCINEX DM) 30-600 MG per 12 hr tablet Take 1 tablet by mouth daily.     diclofenac sodium (VOLTAREN) 1 % GEL Apply 2 g topically 4 (four) times daily.   docusate sodium (COLACE) 100 MG capsule Take 1-2 by mouth once a day.    famotidine (PEPCID) 20 MG tablet Take 1 tablet (20 mg total) by mouth 2 (two) times daily.   Lidocaine 3 % CREA Apply 1 application  topically 3 (three) times daily as needed.   loratadine (CLARITIN) 10 MG tablet Take 10 mg by mouth daily.     losartan-hydrochlorothiazide (HYZAAR) 100-12.5 MG tablet Take 1 tablet by mouth daily.   metoprolol succinate (TOPROL-XL) 25 MG 24 hr tablet Take 1.5 tablets (37.5 mg total) by mouth daily.   mometasone-formoterol (DULERA) 100-5 MCG/ACT AERO Inhale 2 puffs into the lungs in the morning and at bedtime.   Multiple Vitamins-Minerals (CENTRUM SILVER) CHEW Chew by mouth.     NONFORMULARY OR COMPOUNDED ITEM Compression Stockings Dx: M79.89   pantoprazole (PROTONIX) 40 MG tablet Take 1 tablet (40 mg total) by mouth every other day.   sitaGLIPtin (JANUVIA) 50 MG tablet Take 1 tablet (50 mg total) by mouth daily.   umeclidinium bromide (INCRUSE ELLIPTA) 62.5 MCG/INH AEPB Inhale 1 puff into the lungs daily at 12 noon.   Wheat Dextrin (BENEFIBER DRINK MIX PO) Take by mouth 2 (two) times daily.     No current facility-administered medications for this visit. (Other)      REVIEW OF SYSTEMS:    ALLERGIES No Known Allergies  PAST MEDICAL HISTORY Past Medical History:  Diagnosis Date   Allergic rhinitis  COPD  and ASTHMA    Depression    Diabetes mellitus    as an adult   DJD (degenerative joint disease)    GERD (gastroesophageal reflux disease)    and HH w/ reflux   History of colonic polyps    Hyperlipidemia    Hypertension    Past Surgical History:  Procedure Laterality Date   ABDOMINAL HYSTERECTOMY     apparently no oophorectomy   APPENDECTOMY     bilateral breast tumor     BREAST EXCISIONAL BIOPSY Bilateral    one in her 20's and one in her 63's, both benign   CATARACT EXTRACTION Bilateral    Groat   CATARACT EXTRACTION, BILATERAL     CHOLECYSTECTOMY     TONSILLECTOMY     TUBAL LIGATION      FAMILY HISTORY Family History  Problem Relation Age of Onset   Breast cancer Sister        x 2   Diabetes Father    Diabetes Sister      Heart attack Brother        early 5s   Heart attack Son    Pancreatic cancer Son    Stomach cancer Sister    Colon cancer Neg Hx     SOCIAL HISTORY Social History   Tobacco Use   Smoking status: Former Smoker    Packs/day: 1.00    Years: 35.00    Pack years: 35.00    Types: Cigarettes    Quit date: 01/18/1993    Years since quitting: 26.6   Smokeless tobacco: Never Used  Substance Use Topics   Alcohol use: No    Alcohol/week: 0.0 standard drinks   Drug use: No         OPHTHALMIC EXAM: Base Eye Exam    Visual Acuity (ETDRS)      Right Left   Dist cc 20/30 +2 20/200   Dist ph cc NI NI   Correction: Glasses       Tonometry (Tonopen, 1:28 PM)      Right Left   Pressure 14 11       Pupils      Pupils Dark Light Shape React APD   Right PERRL 4 3 Round Brisk None   Left PERRL 4 3 Round Brisk None       Visual Fields (Counting fingers)      Left Right    Full Full       Extraocular Movement      Right Left    Full Full       Neuro/Psych    Oriented x3: Yes   Mood/Affect: Normal       Dilation    Left eye: 1.0% Mydriacyl, 2.5% Phenylephrine @ 1:29 PM        Slit Lamp and Fundus Exam    External Exam      Right Left   External Normal Normal       Slit Lamp Exam      Right Left   Lids/Lashes Normal Normal   Conjunctiva/Sclera White and quiet White and quiet   Cornea Clear Clear   Anterior Chamber Deep and quiet Deep and quiet   Iris Round and reactive Round and reactive   Lens Posterior chamber intraocular lens Posterior chamber intraocular lens, Centered posterior chamber intraocular lens   Anterior Vitreous Normal Normal       Fundus Exam      Right Left   Posterior Vitreous  Posterior  vitreous detachment   Disc  Normal   C/D Ratio  0.1   Macula  Hard drusen, Intraretinal hemorrhage, Macular thickening, Retinal pigment epithelial mottling, Cystoid macular edema, Early age related macular degeneration, Microaneurysms    Vessels  AV nicking, Tortuous, and looks like a retinal arterial macular aneurysm along the inferotemporal arcade with secondary BRVO   Periphery  Normal          IMAGING AND PROCEDURES  Imaging and Procedures for 08/20/19  OCT, Retina - OU - Both Eyes       Right Eye Quality was good. Scan locations included subfoveal. Central Foveal Thickness: 274. Findings include retinal drusen , no SRF, no IRF.   Left Eye Quality was good. Scan locations included subfoveal. Central Foveal Thickness: 359. Findings include retinal drusen , abnormal foveal contour, cystoid macular edema.   Notes No active maculopathy OD  OS with much less CME as compared to April 2021.  Some of the features of exudative retinopathy appear to have abated and in fact this looks most like a retinal vein occlusion along the inferotemporal arcade secondary and associated with a retinal artery macro aneurysm.  CME continues to improve.       Intravitreal Injection, Pharmacologic Agent - OS - Left Eye       Time Out 08/20/2019. 2:37 PM. Confirmed correct patient, procedure, site, and patient consented.   Anesthesia Topical anesthesia was used. Anesthetic medications included Akten 3.5%.   Procedure Preparation included Ofloxacin , 10% betadine to eyelids, 5% betadine to ocular surface. A 30 gauge needle was used.   Injection:  1.25 mg Bevacizumab (AVASTIN) SOLN   NDC: [address]-8, Lot: [id number]   Route: Intravitreal, Site: Left Eye, Waste: 0 mg  Post-op Post injection exam found visual acuity of at least counting fingers. The patient tolerated the procedure well. There were no complications. The patient received written and verbal post procedure care education. Post injection medications were not given.                 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  We will repeat injection intravitreal Avastin today at 5-week interval and maintain interval of treatment to  5  weeks left eye      ICD-10-CM   1. Branch retinal vein occlusion with macular edema of left eye  H34.8320 OCT, Retina - OU - Both Eyes    Intravitreal Injection, Pharmacologic Agent - OS - Left Eye    Bevacizumab (AVASTIN) SOLN 1.25 mg  2. Exudative age-related macular degeneration of left eye with active choroidal neovascularization (HCC)  H35.3221 OCT, Retina - OU - Both Eyes    1.  2.  3.  Ophthalmic Meds Ordered this visit:  Meds ordered this encounter  Medications   Bevacizumab (AVASTIN) SOLN 1.25 mg       Return in about 5 weeks (around 09/24/2019) for DILATE OU, AVASTIN OCT, OS.  There are no Patient Instructions on file for this visit.   Explained the diagnoses, plan, and follow up with the patient and they expressed understanding.  Patient expressed understanding of the importance of proper follow up care.   Clent Demark Angelique Chevalier M.D. Diseases & Surgery of the Retina and Vitreous [facility] [place]  08/20/19     Abbreviations: M myopia (nearsighted); A astigmatism; H hyperopia (farsighted); P presbyopia; Mrx spectacle prescription;  CTL contact lenses; OD right eye; OS left eye; OU both eyes  XT exotropia; ET esotropia; PEK punctate epithelial keratitis; PEE punctate epithelial erosions; DES dry eye syndrome; MGD meibomian gland dysfunction; ATs artificial tears; PFAT's preservative free artificial tears; [place] nuclear sclerotic cataract; PSC posterior subcapsular cataract; ERM epi-retinal membrane; PVD posterior vitreous detachment; RD retinal detachment; DM diabetes mellitus; DR diabetic retinopathy; NPDR non-proliferative diabetic retinopathy; PDR proliferative diabetic retinopathy; CSME clinically  significant macular edema; DME diabetic macular edema; dbh dot blot hemorrhages; CWS cotton wool spot; POAG primary open angle glaucoma; C/D cup-to-disc ratio; HVF humphrey visual field; GVF goldmann visual field; OCT optical coherence tomography; IOP intraocular pressure; BRVO Branch retinal vein occlusion; CRVO central retinal vein occlusion; CRAO central retinal artery occlusion; BRAO branch retinal artery occlusion; RT retinal tear; SB scleral buckle; PPV pars plana vitrectomy; VH Vitreous hemorrhage; PRP panretinal laser photocoagulation; IVK intravitreal kenalog; VMT vitreomacular traction; MH Macular hole;  NVD neovascularization of the disc; NVE neovascularization elsewhere; AREDS age related eye disease study; ARMD age related macular degeneration; POAG primary open angle glaucoma; EBMD epithelial/anterior basement membrane dystrophy; ACIOL anterior chamber intraocular lens; IOL intraocular lens; PCIOL posterior chamber intraocular lens; Phaco/IOL phacoemulsification with intraocular lens placement; [place] photorefractive keratectomy; LASIK laser assisted in situ keratomileusis; HTN hypertension; DM diabetes mellitus; COPD chronic obstructive pulmonary disease

## 2019-08-20 NOTE — Patient Instructions (Signed)
Patient to notify the office of any visual acuity changes or declines or distortions and acuity

## 2019-09-10 ENCOUNTER — Telehealth: Payer: Self-pay | Admitting: Pharmacist

## 2019-09-10 NOTE — Progress Notes (Addendum)
Chronic Care Management Pharmacy Assistant   Name: RONESHA HEENAN  MRN: [id number] DOB: Jun 21, 1926  Reason for Encounter: Initial Questions   PCP : Colon Branch, MD  Allergies:  No Known Allergies  Medications: Outpatient Encounter Medications as of 09/10/2019  Medication Sig  . acetaminophen (TYLENOL) 500 MG tablet Take 500 mg by mouth as needed.  Marland Kitchen albuterol (PROAIR HFA) 108 (90 Base) MCG/ACT inhaler Inhale 2 puffs into the lungs every 6 (six) hours as needed for wheezing or shortness of breath.  Marland Kitchen amLODipine (NORVASC) 10 MG tablet Take 1 tablet (10 mg total) by mouth daily.  Marland Kitchen aspirin 81 MG tablet Take 81 mg by mouth daily.   Marland Kitchen atorvastatin (LIPITOR) 10 MG tablet Take 1 tablet (10 mg total) by mouth daily.  Marland Kitchen dextromethorphan-guaiFENesin (MUCINEX DM) 30-600 MG per 12 hr tablet Take 1 tablet by mouth daily.    . diclofenac sodium (VOLTAREN) 1 % GEL Apply 2 g topically 4 (four) times daily.  Marland Kitchen docusate sodium (COLACE) 100 MG capsule Take 1-2 by mouth once a day.   . famotidine (PEPCID) 20 MG tablet Take 1 tablet (20 mg total) by mouth 2 (two) times daily.  . Lidocaine 3 % CREA Apply 1 application topically 3 (three) times daily as needed.  . loratadine (CLARITIN) 10 MG tablet Take 10 mg by mouth daily.    Marland Kitchen losartan-hydrochlorothiazide (HYZAAR) 100-12.5 MG tablet Take 1 tablet by mouth daily.  . metoprolol succinate (TOPROL-XL) 25 MG 24 hr tablet Take 1.5 tablets (37.5 mg total) by mouth daily.  . mometasone-formoterol (DULERA) 100-5 MCG/ACT AERO Inhale 2 puffs into the lungs in the morning and at bedtime.  . Multiple Vitamins-Minerals (CENTRUM SILVER) CHEW Chew by mouth.    . NONFORMULARY OR COMPOUNDED ITEM Compression Stockings Dx: M79.89  . pantoprazole (PROTONIX) 40 MG tablet Take 1 tablet (40 mg total) by mouth every other day.  . sitaGLIPtin (JANUVIA) 50 MG tablet Take 1 tablet (50 mg total) by mouth daily.  Marland Kitchen umeclidinium bromide (INCRUSE ELLIPTA) 62.5 MCG/INH AEPB Inhale  1 puff into the lungs daily at 12 noon.  . Wheat Dextrin (BENEFIBER DRINK MIX PO) Take by mouth 2 (two) times daily.     No facility-administered encounter medications on file as of 09/10/2019.    Current Diagnosis: Patient Active Problem List   Diagnosis Date Noted  . Exudative age-related macular degeneration of left eye with active choroidal neovascularization ([place]) 07/17/2019  . Branch retinal vein occlusion with macular edema of left eye 06/12/2019  . Retinal telangiectasia of left eye 04/23/2019  . Snores 04/23/2019  . Primary osteoarthritis of right knee 06/22/2016  . PCP NOTES >>>>>>>>>>>>> 11/19/2014  . Abdominal lymphadenopathy 08/09/2014  . Pulmonary nodules 05/01/2014  . Allergic rhinitis 04/18/2014  . Tremor 09/26/2013  . Elevated LFTs 08/13/2011  . Annual physical exam 07/10/2010  . Osteoarthritis 07/08/2009  . COLONIC POLYPS, ADENOMATOUS, HX OF 08/14/2008  . ALLERGIC RHINITIS 05/02/2007  . DMII (diabetes mellitus, type 2) ([place]) 12/12/2006  . Hyperlipidemia 10/10/2006  . DEPRESSION 10/10/2006  . GERD 10/10/2006  . HIATAL HERNIA WITH REFLUX 10/10/2006  . Essential hypertension 03/13/2006  . COPD  03/13/2006    Goals Addressed   None     Chronic Care Management   Outreach Note  09/11/2019 Name: DESTONY PREVOST MRN: [id number] DOB: 1926/04/12  Referred by: Colon Branch, MD Reason for referral : Chronic Care Management   An unsuccessful telephone outreach was attempted today. The patient was  referred to the pharmacist for assistance with care management and care coordination.   Follow Up Plan:  Attempted to compete initial questions prior to visit with clinical pharmacist. Received a text message from the patient's daughter asking to cancel appointment  Follow-Up:  Pharmacist Review   Fanny Skates, [place] Pharmacist Assistant [phone]

## 2019-09-11 ENCOUNTER — Telehealth: Payer: Medicare Other

## 2019-09-11 ENCOUNTER — Telehealth: Payer: Self-pay | Admitting: Internal Medicine

## 2019-09-11 NOTE — Progress Notes (Signed)
  Chronic Care Management   Outreach Note  09/11/2019 Name: Rebecca Elliott MRN: [id number] DOB: [birth year]  Referred by: Colon Branch, MD Reason for referral : No chief complaint on file.   An unsuccessful telephone outreach was attempted today. The patient was referred to the pharmacist for assistance with care management and care coordination.   Follow Up Plan:   Carley Perdue [employer] Scheduler

## 2019-09-12 ENCOUNTER — Telehealth: Payer: Self-pay | Admitting: Internal Medicine

## 2019-09-12 NOTE — Progress Notes (Signed)
  Chronic Care Management   Outreach Note  09/12/2019 Name: Rebecca Elliott MRN: [id number] DOB: [birth year]  Referred by: Colon Branch, MD Reason for referral : No chief complaint on file.   Third unsuccessful telephone outreach was attempted today. The patient was referred to the pharmacist for assistance with care management and care coordination.   Follow Up Plan:   Carley Perdue [employer] Scheduler

## 2019-09-25 ENCOUNTER — Encounter (INDEPENDENT_AMBULATORY_CARE_PROVIDER_SITE_OTHER): Payer: Self-pay | Admitting: Ophthalmology

## 2019-09-25 ENCOUNTER — Other Ambulatory Visit: Payer: Self-pay

## 2019-09-25 ENCOUNTER — Ambulatory Visit (INDEPENDENT_AMBULATORY_CARE_PROVIDER_SITE_OTHER): Payer: Medicare Other | Admitting: Ophthalmology

## 2019-09-25 DIAGNOSIS — H353131 Nonexudative age-related macular degeneration, bilateral, early dry stage: Secondary | ICD-10-CM | POA: Insufficient documentation

## 2019-09-25 DIAGNOSIS — H34832 Tributary (branch) retinal vein occlusion, left eye, with macular edema: Secondary | ICD-10-CM | POA: Diagnosis not present

## 2019-09-25 MED ORDER — BEVACIZUMAB CHEMO INJECTION 1.25MG/0.05ML SYRINGE FOR KALEIDOSCOPE
1.2500 mg | INTRAVITREAL | Status: AC | PRN
  Administered 2019-09-25: 1.25 mg via INTRAVITREAL

## 2019-09-25 NOTE — Assessment & Plan Note (Signed)
OS with much less CME from the inferotemporal macular branch retinal vein occlusion.  We will repeat injection OS today and examination In 5 to 6 weeks

## 2019-09-25 NOTE — Assessment & Plan Note (Signed)

## 2019-09-25 NOTE — Progress Notes (Signed)
09/25/2019     CHIEF COMPLAINT Patient presents for Retina Follow Up   HISTORY OF PRESENT ILLNESS: Rebecca Elliott is a 86 y.o. female who presents to the clinic today for:   HPI    Retina Follow Up    Patient presents with  CRVO/BRVO.  In left eye.  This started 5 weeks ago.  Severity is mild.  Duration of 5 weeks.  Since onset it is stable.          Comments    5 Week F/U OU, poss Avastin OS  Pt denies noticeable changes to [place] OU since last visit. Pt denies ocular pain, flashes of light, or floaters OU. Pt sts, "it's no better in the left eye."       Last edited by Rockie Neighbours, [place] on 09/25/2019  1:15 PM. (History)      Referring physician: Colon Branch, MD [address] [place],  [place] [address]  HISTORICAL INFORMATION:   Selected notes from the MEDICAL RECORD NUMBER    Lab Results  Component Value Date   HGBA1C 7.7 (H) 08/01/2019     CURRENT MEDICATIONS: No current outpatient medications on file. (Ophthalmic Drugs)   No current facility-administered medications for this visit. (Ophthalmic Drugs)   Current Outpatient Medications (Other)  Medication Sig   acetaminophen (TYLENOL) 500 MG tablet Take 500 mg by mouth as needed.   albuterol (PROAIR HFA) 108 (90 Base) MCG/ACT inhaler Inhale 2 puffs into the lungs every 6 (six) hours as needed for wheezing or shortness of breath.   amLODipine (NORVASC) 10 MG tablet Take 1 tablet (10 mg total) by mouth daily.   aspirin 81 MG tablet Take 81 mg by mouth daily.    atorvastatin (LIPITOR) 10 MG tablet Take 1 tablet (10 mg total) by mouth daily.   dextromethorphan-guaiFENesin (MUCINEX DM) 30-600 MG per 12 hr tablet Take 1 tablet by mouth daily.     diclofenac sodium (VOLTAREN) 1 % GEL Apply 2 g topically 4 (four) times daily.   docusate sodium (COLACE) 100 MG capsule Take 1-2 by mouth once a day.    famotidine (PEPCID) 20 MG tablet Take 1 tablet (20 mg total) by mouth 2 (two) times daily.    Lidocaine 3 % CREA Apply 1 application topically 3 (three) times daily as needed.   loratadine (CLARITIN) 10 MG tablet Take 10 mg by mouth daily.     losartan-hydrochlorothiazide (HYZAAR) 100-12.5 MG tablet Take 1 tablet by mouth daily.   metoprolol succinate (TOPROL-XL) 25 MG 24 hr tablet Take 1.5 tablets (37.5 mg total) by mouth daily.   mometasone-formoterol (DULERA) 100-5 MCG/ACT AERO Inhale 2 puffs into the lungs in the morning and at bedtime.   Multiple Vitamins-Minerals (CENTRUM SILVER) CHEW Chew by mouth.     NONFORMULARY OR COMPOUNDED ITEM Compression Stockings Dx: M79.89   pantoprazole (PROTONIX) 40 MG tablet Take 1 tablet (40 mg total) by mouth every other day.   sitaGLIPtin (JANUVIA) 50 MG tablet Take 1 tablet (50 mg total) by mouth daily.   umeclidinium bromide (INCRUSE ELLIPTA) 62.5 MCG/INH AEPB Inhale 1 puff into the lungs daily at 12 noon.   Wheat Dextrin (BENEFIBER DRINK MIX PO) Take by mouth 2 (two) times daily.     No current facility-administered medications for this visit. (Other)      REVIEW OF SYSTEMS:    ALLERGIES No Known Allergies  PAST MEDICAL HISTORY Past Medical History:  Diagnosis Date   Allergic rhinitis  COPD  and ASTHMA    Depression    Diabetes mellitus    as an adult   DJD (degenerative joint disease)    GERD (gastroesophageal reflux disease)    and HH w/ reflux   History of colonic polyps    Hyperlipidemia    Hypertension    Past Surgical History:  Procedure Laterality Date   ABDOMINAL HYSTERECTOMY     apparently no oophorectomy   APPENDECTOMY     bilateral breast tumor     BREAST EXCISIONAL BIOPSY Bilateral    one in her 20's and one in her 51's, both benign   CATARACT EXTRACTION Bilateral    Groat   CATARACT EXTRACTION, BILATERAL     CHOLECYSTECTOMY     TONSILLECTOMY     TUBAL LIGATION      FAMILY HISTORY Family History  Problem Relation Age of Onset   Breast cancer Sister        x 2    Diabetes Father    Diabetes Sister    Heart attack Brother        early 73s   Heart attack Son    Pancreatic cancer Son    Stomach cancer Sister    Colon cancer Neg Hx     SOCIAL HISTORY Social History   Tobacco Use   Smoking status: Former Smoker    Packs/day: 1.00    Years: 35.00    Pack years: 35.00    Types: Cigarettes    Quit date: 01/18/1993    Years since quitting: 26.7   Smokeless tobacco: Never Used  Substance Use Topics   Alcohol use: No    Alcohol/week: 0.0 standard drinks   Drug use: No         OPHTHALMIC EXAM:  Base Eye Exam    Visual Acuity (ETDRS)      Right Left   Dist cc 20/25 -2 20/200   Dist ph cc  20/100   Correction: Glasses       Tonometry (Tonopen, 1:17 PM)      Right Left   Pressure 14 13       Pupils      Pupils Dark Light Shape React APD   Right PERRL 4 3 Round Brisk None   Left PERRL 4 3 Round Brisk None       Visual Fields (Counting fingers)      Left Right    Full Full       Extraocular Movement      Right Left    Full Full       Neuro/Psych    Oriented x3: Yes   Mood/Affect: Normal       Dilation    Both eyes: 1.0% Mydriacyl, 2.5% Phenylephrine @ 1:20 PM        Slit Lamp and Fundus Exam    External Exam      Right Left   External Normal Normal       Slit Lamp Exam      Right Left   Lids/Lashes Normal Normal   Conjunctiva/Sclera White and quiet White and quiet   Cornea Clear Clear   Anterior Chamber Deep and quiet Deep and quiet   Iris Round and reactive Round and reactive   Lens Posterior chamber intraocular lens Posterior chamber intraocular lens, Centered posterior chamber intraocular lens   Anterior Vitreous Normal Normal       Fundus Exam      Right Left   Posterior Vitreous Normal Posterior  vitreous detachment   Disc Normal Normal   C/D Ratio 0.1 0.1   Macula Normal Hard drusen, Intraretinal hemorrhage, Macular thickening, Retinal pigment epithelial mottling, Cystoid macular edema,  Early age related macular degeneration, Microaneurysms   Vessels Normal AV nicking, Tortuous, and 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 Scan locations included subfoveal. Central Foveal Thickness: 274. Findings include retinal drusen , no SRF, no IRF.   Left Eye Quality was good. Scan locations included subfoveal. Central Foveal Thickness: 318. Findings include retinal drusen , abnormal foveal contour, cystoid macular edema.   Notes No active maculopathy OD  OS with much less CME as compared to April 2021.  Some of the features of exudative retinopathy appear to have abated and in fact this looks most like a retinal vein occlusion along the inferotemporal arcade secondary and associated with a retinal artery macro aneurysm.  CME continues to improve.       Intravitreal Injection, Pharmacologic Agent - OS - Left Eye       Time Out 09/25/2019. 1:43 PM. Confirmed correct patient, procedure, site, and patient consented.   Anesthesia Topical anesthesia was used. Anesthetic medications included [place] 3.5%.   Procedure Preparation included Ofloxacin , 10% betadine to eyelids, 5% betadine to ocular surface, Tobramycin 0.3%. A supplied needle was used.   Injection:  1.25 mg Bevacizumab (AVASTIN) SOLN   NDC: [address]-3, Lot: [id number]   Route: Intravitreal, Site: Left Eye, Waste: 0 mg  Post-op Post injection exam found visual acuity of at least counting fingers. The patient tolerated the procedure well. There were no complications. The patient received written and verbal post procedure care education. Post injection medications were not given.                 ASSESSMENT/PLAN:  Branch retinal vein occlusion with macular edema of left eye OS with much less  CME from the inferotemporal macular branch retinal vein occlusion.  We will repeat injection OS today and examination In 5 to 6 weeks  Early stage nonexudative age-related macular degeneration of both eyes The nature of age--related macular degeneration was discussed with the patient as well as the distinction between dry and wet types. Checking an [place]ler Grid daily with advice to return immediately should a distortion develop, was given to the patient. The patient 's smoking status now and in the past was determined and advice based on the AREDS study was provided regarding the consumption of antioxidant supplements. AREDS 2 vitamin formulation was recommended. Consumption of dark leafy vegetables and fresh fruits of various colors was recommended. Treatment modalities for wet macular degeneration particularly the use of intravitreal injections of anti-blood vessel growth factors was discussed with the patient. Avastin, Lucentis, and Eylea are the available options. On occasion, therapy includes the use of photodynamic therapy and thermal laser. Stressed to the patient do not rub eyes.  Patient was advised to check Amsler Grid daily and return immediately if changes are noted. Instructions on using the grid were given to the patient. All patient questions were answered.      ICD-10-CM   1. Branch retinal vein occlusion with macular edema of left eye  H34.8320 OCT, Retina - OU - Both Eyes  Intravitreal Injection, Pharmacologic Agent - OS - Left Eye    Bevacizumab (AVASTIN) SOLN 1.25 mg  2. Early stage nonexudative age-related macular degeneration of both eyes  H35.3131     1.  CME OS has continued to improve from branch retinal vein occlusion status post intravitreal Avastin currently at 5-week interval repeat injection today and examination in 6 weeks  2.  3.  Ophthalmic Meds Ordered this visit:  Meds ordered this encounter  Medications   Bevacizumab (AVASTIN) SOLN 1.25 mg       Return  in about 6 weeks (around 11/06/2019) for dilate, OS, AVASTIN OCT.  Patient Instructions  To report any new onset visual difficulties or declines    Explained the diagnoses, plan, and follow up with the patient and they expressed understanding.  Patient expressed understanding of the importance of proper follow up care.   Clent Demark Kiegan Macaraeg M.D. Diseases & Surgery of the Retina and Vitreous [facility] [place] 09/25/19     Abbreviations: M myopia (nearsighted); A astigmatism; H hyperopia (farsighted); P presbyopia; Mrx spectacle prescription;  CTL contact lenses; OD right eye; OS left eye; OU both eyes  XT exotropia; ET esotropia; PEK punctate epithelial keratitis; PEE punctate epithelial erosions; DES dry eye syndrome; MGD meibomian gland dysfunction; ATs artificial tears; PFAT's preservative free artificial tears; [place] nuclear sclerotic cataract; PSC posterior subcapsular cataract; ERM epi-retinal membrane; PVD posterior vitreous detachment; RD retinal detachment; DM diabetes mellitus; DR diabetic retinopathy; NPDR non-proliferative diabetic retinopathy; PDR proliferative diabetic retinopathy; CSME clinically significant macular edema; DME diabetic macular edema; dbh dot blot hemorrhages; CWS cotton wool spot; POAG primary open angle glaucoma; C/D cup-to-disc ratio; HVF humphrey visual field; GVF goldmann visual field; OCT optical coherence tomography; IOP intraocular pressure; BRVO Branch retinal vein occlusion; CRVO central retinal vein occlusion; CRAO central retinal artery occlusion; BRAO branch retinal artery occlusion; RT retinal tear; SB scleral buckle; PPV pars plana vitrectomy; VH Vitreous hemorrhage; PRP panretinal laser photocoagulation; IVK intravitreal kenalog; VMT vitreomacular traction; MH Macular hole;  NVD neovascularization of the disc; NVE neovascularization elsewhere; AREDS age related eye disease study; ARMD age related macular degeneration; POAG primary open angle  glaucoma; EBMD epithelial/anterior basement membrane dystrophy; ACIOL anterior chamber intraocular lens; IOL intraocular lens; PCIOL posterior chamber intraocular lens; Phaco/IOL phacoemulsification with intraocular lens placement; [place] photorefractive keratectomy; LASIK laser assisted in situ keratomileusis; HTN hypertension; DM diabetes mellitus; COPD chronic obstructive pulmonary disease

## 2019-09-25 NOTE — Patient Instructions (Signed)
To report any new onset visual difficulties or declines

## 2019-09-29 ENCOUNTER — Other Ambulatory Visit: Payer: Self-pay | Admitting: Internal Medicine

## 2019-10-15 IMAGING — DX LEFT HAND - COMPLETE 3+ VIEW
4 series · 4 of 4 positions shown · non-contrast
Comparison: None.

CLINICAL DATA: Fall.  Pain fifth finger

EXAM:
LEFT HAND - COMPLETE 3+ VIEW

[hand pa]
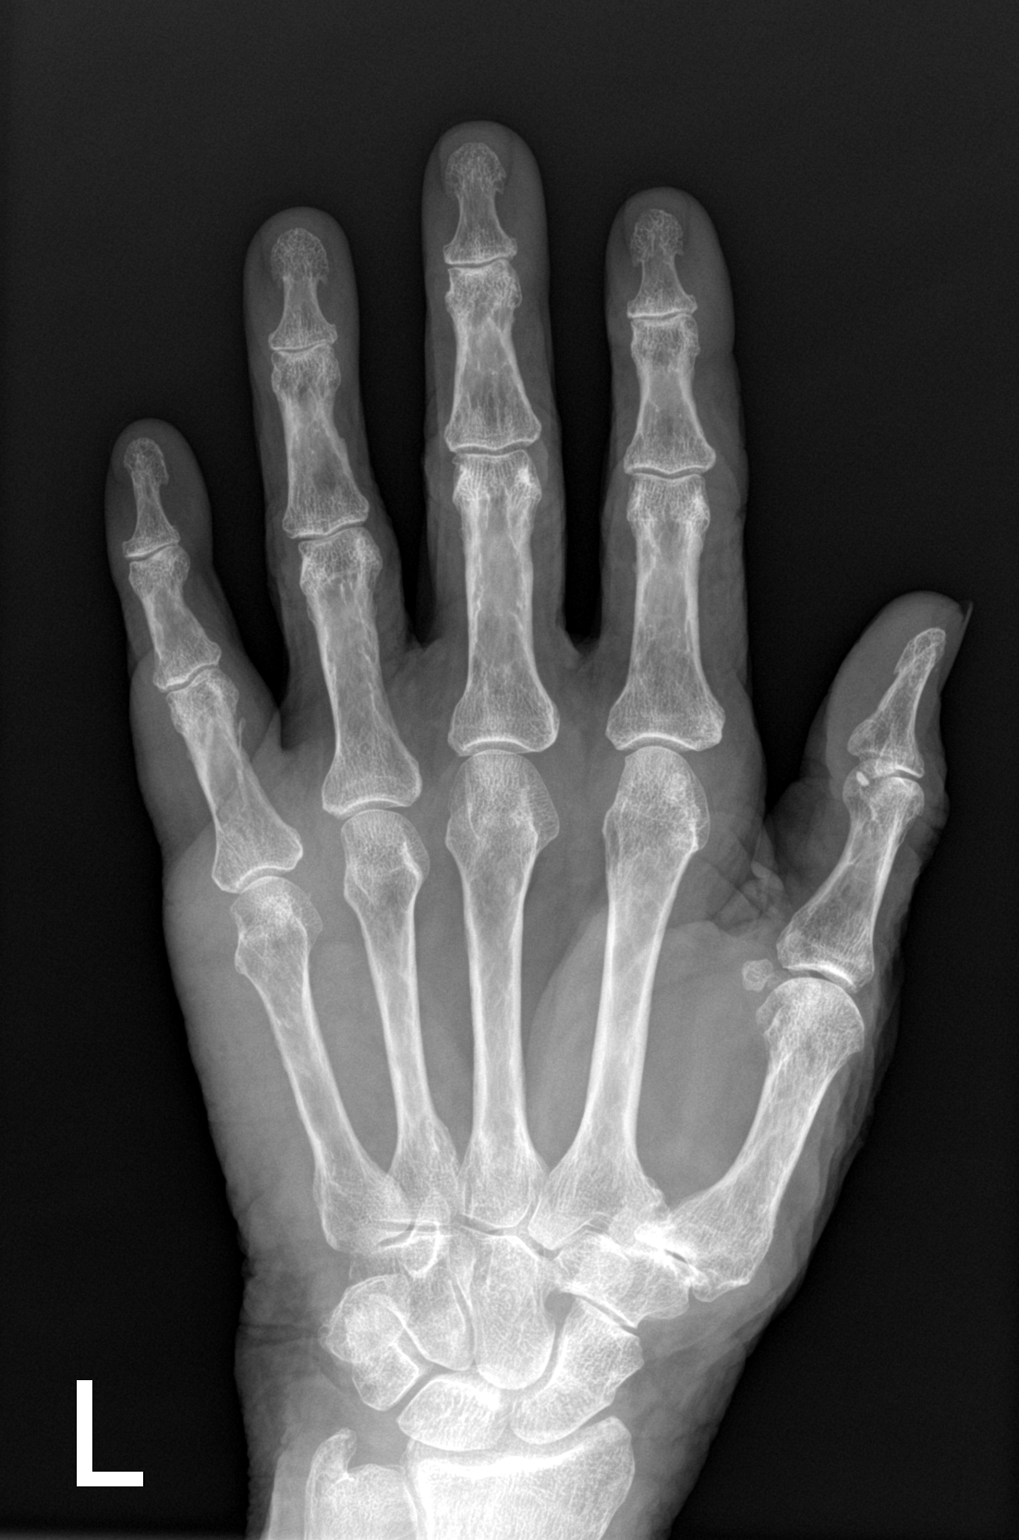

[hand obl]
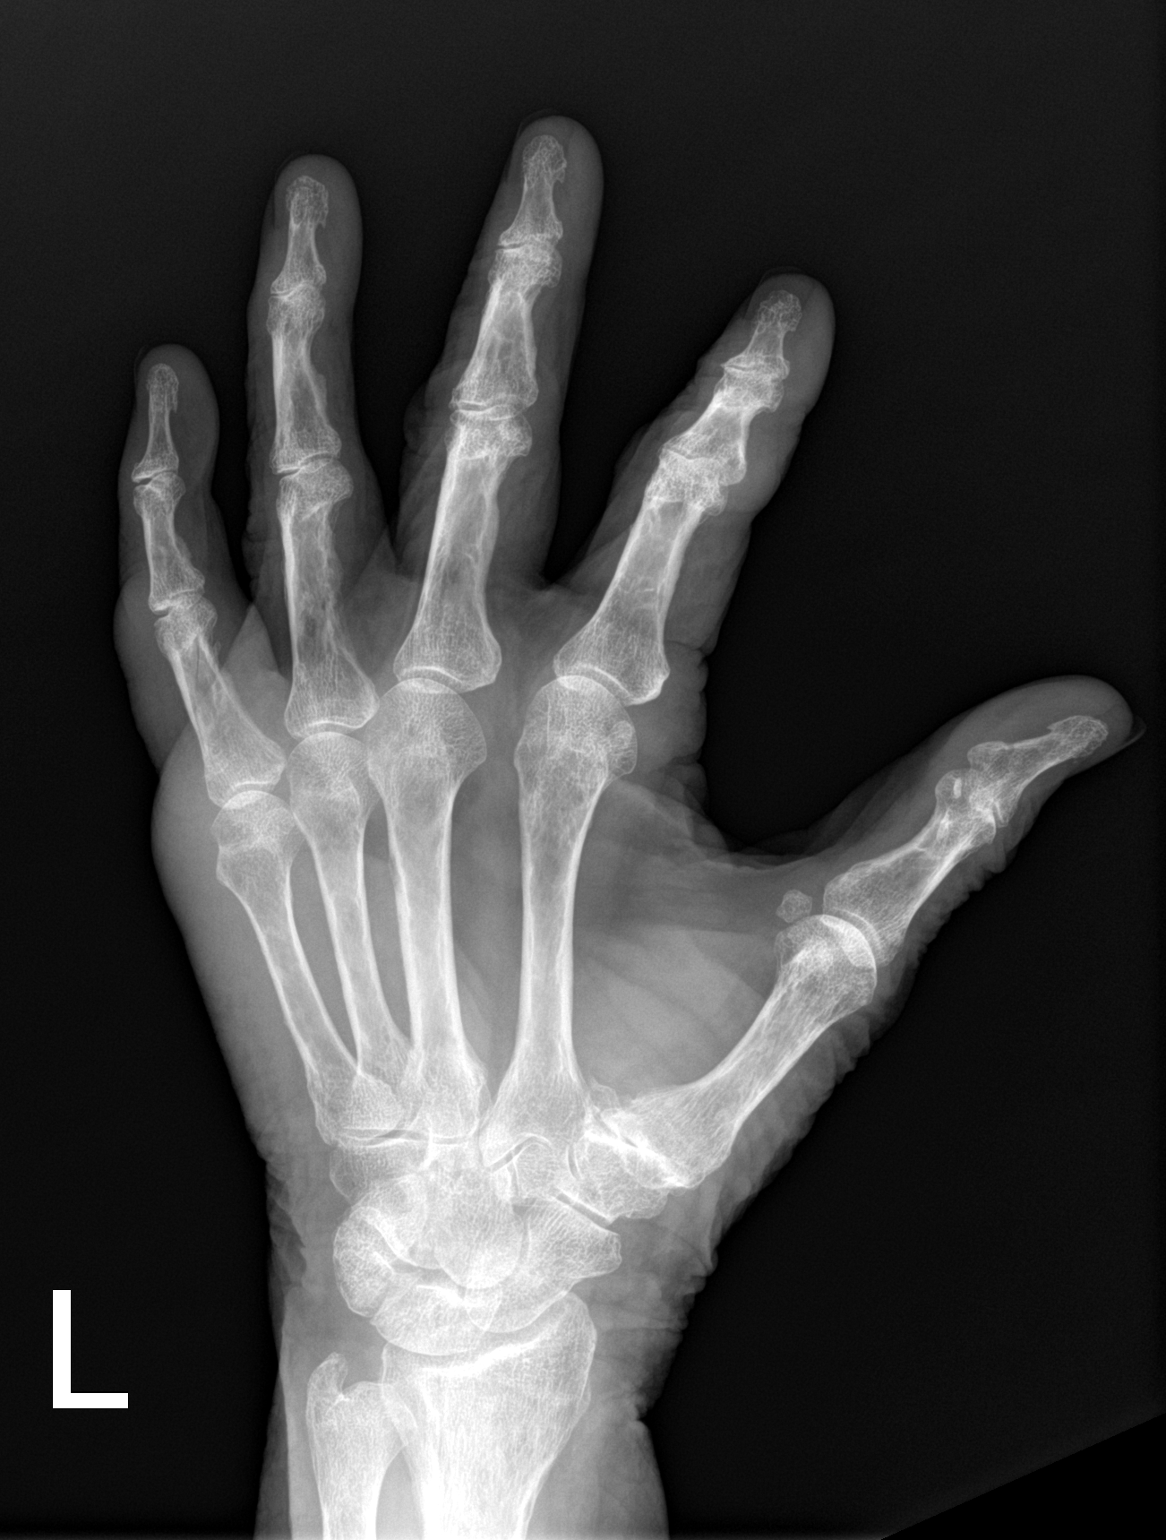

[hand lat (1 of 2)]
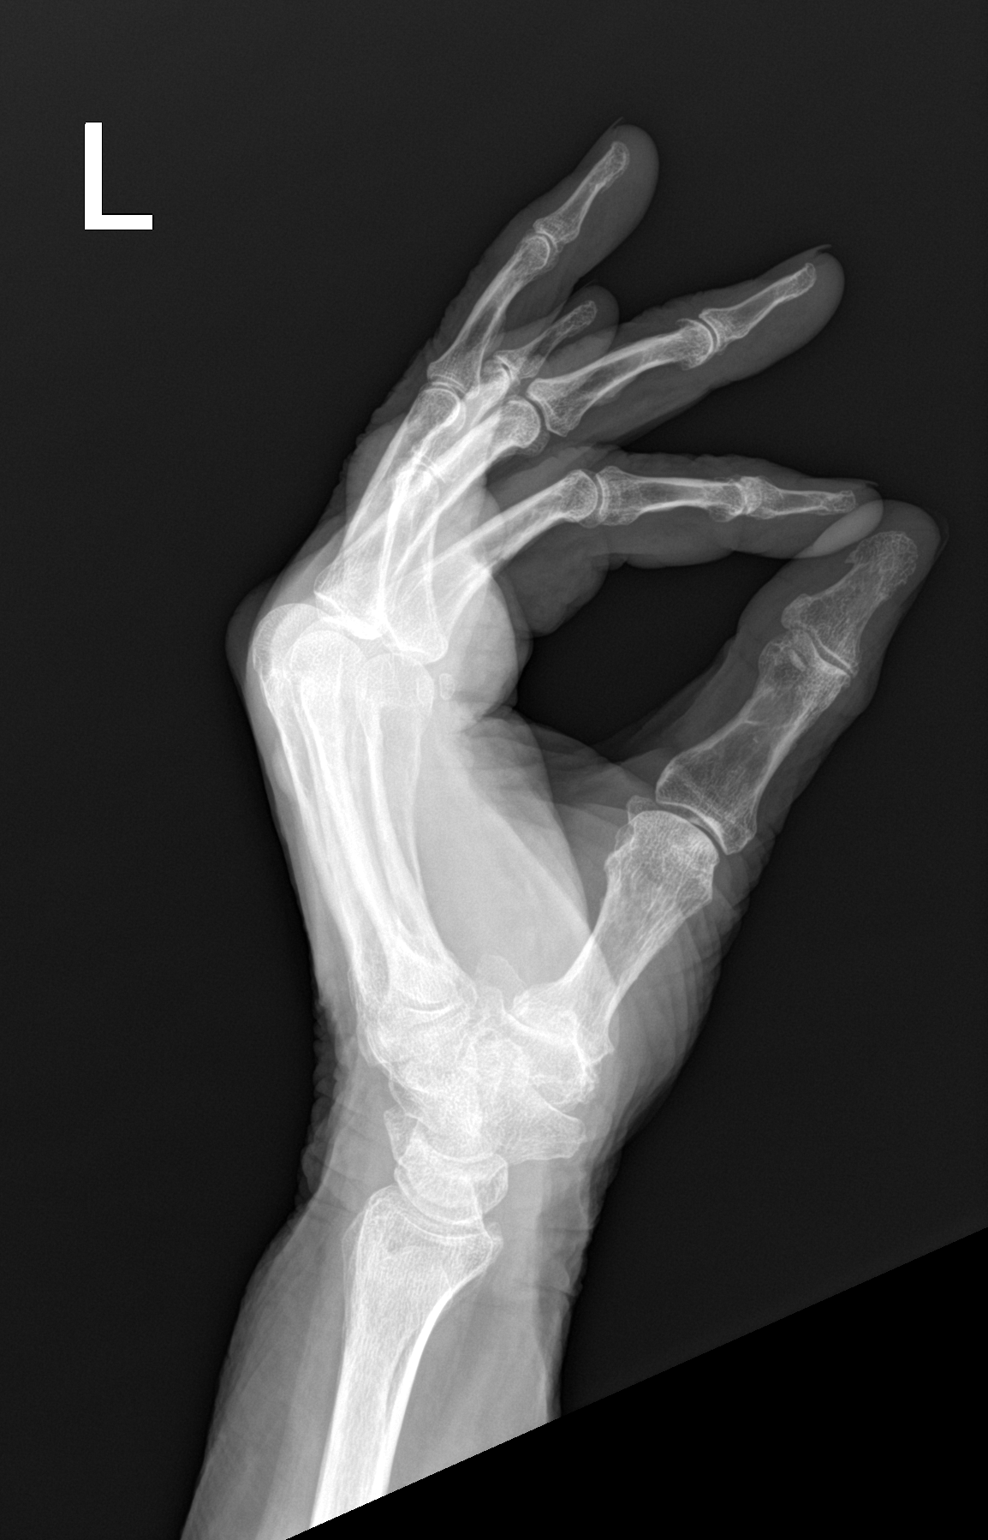

[hand lat (2 of 2)]
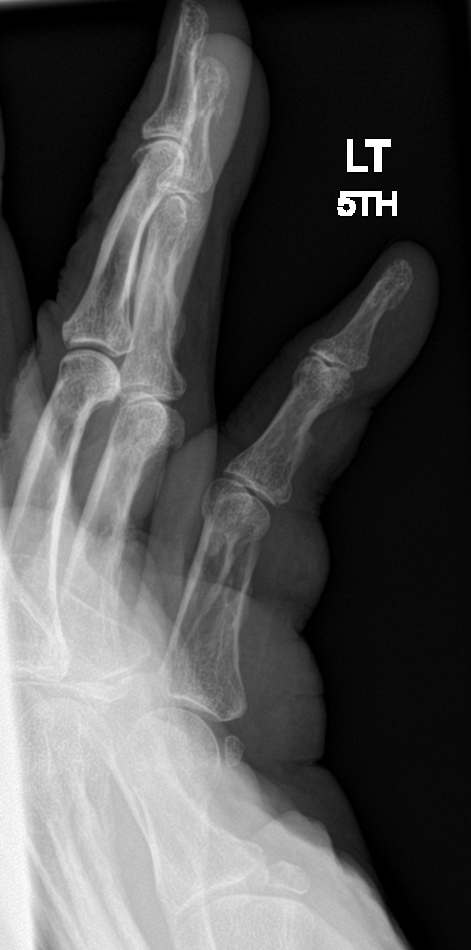

[4 of 4 positions shown; findings below may reference images not displayed]

FINDINGS: Nondisplaced fracture of the fifth proximal phalanx. No other
fracture. Moderate degenerative change base of thumb. Mild
degenerative change in the DIP joints. No erosion.
IMPRESSION: Nondisplaced fracture proximal fifth phalanx.

## 2019-11-07 ENCOUNTER — Encounter (INDEPENDENT_AMBULATORY_CARE_PROVIDER_SITE_OTHER): Payer: Medicare Other | Admitting: Ophthalmology

## 2019-11-07 ENCOUNTER — Ambulatory Visit (HOSPITAL_BASED_OUTPATIENT_CLINIC_OR_DEPARTMENT_OTHER)
Admission: RE | Admit: 2019-11-07 | Discharge: 2019-11-07 | Disposition: A | Discharge status: Home / Self Care | Payer: Medicare Other | Source: Ambulatory Visit | Attending: Internal Medicine | Admitting: Internal Medicine

## 2019-11-07 ENCOUNTER — Ambulatory Visit (INDEPENDENT_AMBULATORY_CARE_PROVIDER_SITE_OTHER): Payer: Medicare Other | Admitting: Internal Medicine

## 2019-11-07 ENCOUNTER — Encounter (INDEPENDENT_AMBULATORY_CARE_PROVIDER_SITE_OTHER): Payer: Self-pay

## 2019-11-07 ENCOUNTER — Other Ambulatory Visit: Payer: Self-pay

## 2019-11-07 ENCOUNTER — Encounter: Payer: Self-pay | Admitting: Internal Medicine

## 2019-11-07 VITALS — BP 128/68 | HR 73 | Temp 97.8°F | Resp 18 | Ht 66.0 in | Wt 209.4 lb

## 2019-11-07 DIAGNOSIS — Z9049 Acquired absence of other specified parts of digestive tract: Secondary | ICD-10-CM | POA: Diagnosis not present

## 2019-11-07 DIAGNOSIS — J449 Chronic obstructive pulmonary disease, unspecified: Secondary | ICD-10-CM | POA: Diagnosis not present

## 2019-11-07 DIAGNOSIS — E119 Type 2 diabetes mellitus without complications: Secondary | ICD-10-CM | POA: Diagnosis not present

## 2019-11-07 DIAGNOSIS — I10 Essential (primary) hypertension: Secondary | ICD-10-CM

## 2019-11-07 DIAGNOSIS — R0602 Shortness of breath: Secondary | ICD-10-CM | POA: Diagnosis not present

## 2019-11-07 DIAGNOSIS — J441 Chronic obstructive pulmonary disease with (acute) exacerbation: Secondary | ICD-10-CM

## 2019-11-07 MED ORDER — IPRATROPIUM-ALBUTEROL 0.5-2.5 (3) MG/3ML IN SOLN: 3.0000 mL | Freq: Four times a day (QID) | RESPIRATORY_TRACT | 5 refills | Status: DC | PRN

## 2019-11-07 MED ORDER — ALBUTEROL SULFATE (2.5 MG/3ML) 0.083% IN NEBU
2.5000 mg | INHALATION_SOLUTION | Freq: Four times a day (QID) | RESPIRATORY_TRACT | 5 refills | Status: DC | PRN
Start: 2019-11-07 — End: 2019-11-07

## 2019-11-07 MED ORDER — PREDNISONE 10 MG PO TABS: ORAL_TABLET | ORAL | 0 refills | Status: DC

## 2019-11-07 MED ORDER — AZITHROMYCIN 250 MG PO TABS: ORAL_TABLET | ORAL | 0 refills | Status: DC

## 2019-11-07 NOTE — Progress Notes (Signed)
Subjective:    Patient ID: Rebecca Elliott, female    DOB: 31-Aug-1926, 86 y.o.   MRN: [id number]  DOS:  11/07/2019 Type of visit - description: Acute, here with her daughter Her main concern is respiratory problems: For few months reports chest congestion, cough, sputum production. Sputum is typically yellow. Symptoms are slightly worse over the last 10 days to 2 weeks. This morning sputum was milky color and dark which is unusual for her. No clear-cut hemoptysis.  She stopped using the CPAP several days ago, she thought that may be playing a role on her increase sxs.  Wt Readings from Last 3 Encounters:  11/07/19 209 lb 6 oz (95 kg)  08/01/19 220 lb 6 oz (100 kg)  05/23/19 223 lb (101.2 kg)     Review of Systems No fever chills Has anterior chest pain only with deep breathing. + DOE, to less than 20 steps.  Also, developed diarrhea 4 days ago, watery, no blood in the stools. Some nausea but no vomiting.  H/o CHF, today reports  no orthopnea, minimal swelling at the ankles. No weight gain actually she has lost some weight   Past Medical History:  Diagnosis Date   Allergic rhinitis    COPD  and ASTHMA    Depression    Diabetes mellitus    as an adult   DJD (degenerative joint disease)    GERD (gastroesophageal reflux disease)    and HH w/ reflux   History of colonic polyps    Hyperlipidemia    Hypertension     Past Surgical History:  Procedure Laterality Date   ABDOMINAL HYSTERECTOMY     apparently no oophorectomy   APPENDECTOMY     bilateral breast tumor     BREAST EXCISIONAL BIOPSY Bilateral    one in her 20's and one in her 61's, both benign   CATARACT EXTRACTION Bilateral    Groat   CATARACT EXTRACTION, BILATERAL     CHOLECYSTECTOMY     TONSILLECTOMY     TUBAL LIGATION      Allergies as of 11/07/2019   No Known Allergies     Medication List       Accurate as of November 07, 2019 11:59 PM. If you have any questions, ask your  nurse or doctor.        albuterol 108 (90 Base) MCG/ACT inhaler Commonly known as: ProAir HFA Inhale 2 puffs into the lungs every 6 (six) hours as needed for wheezing or shortness of breath.   amLODipine 10 MG tablet Commonly known as: NORVASC Take 1 tablet (10 mg total) by mouth daily.   aspirin 81 MG tablet Take 81 mg by mouth daily.   atorvastatin 10 MG tablet Commonly known as: LIPITOR Take 1 tablet (10 mg total) by mouth daily.   azithromycin 250 MG tablet Commonly known as: ZITHROMAX Take 2 tablets by mouth first day, then 1 tablet for 4 additional days Started by: Kathlene November, MD   BENEFIBER DRINK MIX PO Take by mouth 2 (two) times daily.   Centrum Silver Chew Chew by mouth.   dextromethorphan-guaiFENesin 30-600 MG 12hr tablet Commonly known as: MUCINEX DM Take 1 tablet by mouth daily.   diclofenac sodium 1 % Gel Commonly known as: VOLTAREN Apply 2 g topically 4 (four) times daily.   docusate sodium 100 MG capsule Commonly known as: COLACE Take 1-2 by mouth once a day.   Dulera 100-5 MCG/ACT Aero Generic drug: mometasone-formoterol Inhale 2 puffs into the  lungs in the morning and at bedtime.   famotidine 20 MG tablet Commonly known as: PEPCID Take 1 tablet (20 mg total) by mouth 2 (two) times daily.   Incruse Ellipta 62.5 MCG/INH Aepb Generic drug: umeclidinium bromide Inhale 1 puff into the lungs daily at 12 noon.   ipratropium-albuterol 0.5-2.5 (3) MG/3ML Soln Commonly known as: DUONEB Take 3 mLs by nebulization every 6 (six) hours as needed. Started by: Kathlene November, MD   Lidocaine 3 % Crea Apply 1 application topically 3 (three) times daily as needed.   loratadine 10 MG tablet Commonly known as: CLARITIN Take 10 mg by mouth daily.   losartan-hydrochlorothiazide 100-12.5 MG tablet Commonly known as: HYZAAR Take 1 tablet by mouth daily.   metoprolol succinate 25 MG 24 hr tablet Commonly known as: TOPROL-XL Take 1.5 tablets (37.5 mg total) by  mouth daily.   NONFORMULARY OR COMPOUNDED ITEM Compression Stockings Dx: M79.89   pantoprazole 40 MG tablet Commonly known as: PROTONIX Take 1 tablet (40 mg total) by mouth every other day.   predniSONE 10 MG tablet Commonly known as: DELTASONE 3 tabs x 3 days, 2 tabs x 3 days, 1 tab x 3 days Started by: Kathlene November, MD   sitaGLIPtin 50 MG tablet Commonly known as: Januvia Take 1 tablet (50 mg total) by mouth daily.   TYLENOL 500 MG tablet Generic drug: acetaminophen Take 500 mg by mouth as needed.            Durable Medical Equipment  (From admission, onward)         Start     Ordered   11/07/19 0000  For home use only DME Nebulizer machine       Question Answer Comment  Patient needs a nebulizer to treat with the following condition COPD (chronic obstructive pulmonary disease) ([place])   Length of Need Lifetime      11/07/19 0928             Objective:   Physical Exam BP 128/68 (BP Location: Right Arm, Patient Position: Sitting, Cuff Size: Normal)    Pulse 73    Temp 97.8 F (36.6 C) (Oral)    Resp 18    Ht 5\' 6"  (1.676 m)    Wt 209 lb 6 oz (95 kg)    SpO2 97%    BMI 33.79 kg/m  General:   Well developed, NAD, BMI noted.  HEENT:  Normocephalic . Face symmetric, atraumatic Lungs:  Slightly decreased breath sounds, few wheezes bilaterally. No crackles. + Large airway congestion Normal respiratory effort, no intercostal retractions, no accessory muscle use. Heart: RRR,  no murmur.  Abdomen:  Not distended, soft, non-tender. No rebound or rigidity.   Skin: Not pale. Not jaundice Lower extremities: no pretibial edema bilaterally  Neurologic:  alert & oriented X3.  Speech normal, gait: Assisted by a walker. She did get slightly agitated transferring to the table. Psych--  Cognition and judgment appear intact.  Cooperative with normal attention span and concentration.  Behavior appropriate. No anxious or depressed appearing.     Assessment      ASSESSMENT DM--no neuropathy, Metformin DC 04-2015 d/t creatinine HTN  Hyperlipidemia Depression COPD - Asthma Pulmonary nodules per CXR 03-2014, CT 03-2016, stable, no further CTs needed Anemia, chronic, on-off iron def  Colonoscopy 2004: No polyps EGD 2006 for dysphagia showed hiatal hernia, occult  stricture? Colonoscopy 2010 showed diverticuli Dysphagia: Barium swallow showed dysmotility 08-2014 DJD GERD OSA DX 05/2019  Macular degeneration currently L  Branch retinal vein occlusion  PLAN: COPD asthma: Not well for few months, symptoms increased for few days. Continue Dulera, Incruse Ellipta. Continue albuterol inhaler, start a nebulizer with DuoNeb Prednisone for few days, unfortunately she has diabetes and does not have a way to check CBGs. Mucinex DM, Z-Pak Check a CBC and Chest x-ray Call if not gradually better HTN: BP seems okay,Continue amlodipine, Hyzaar, metoprolol, BMP DM: On Januvia, check a A1c Rec Pfizer booster and a flu shot once she is better. OSA: She stopped using the CPAP few days ago, recommend to go back as soon as she can Diarrhea, unclear etiology, observation for now. RTC schedule for November 17    This visit occurred during the SARS-CoV-2 public health emergency.  Safety protocols were in place, including screening questions prior to the visit, additional usage of staff PPE, and extensive cleaning of exam room while observing appropriate contact time as indicated for disinfecting solutions.

## 2019-11-07 NOTE — Patient Instructions (Addendum)
For your breathing: Continue Dulera, Incruse Ellipta Continue albuterol inhaler We are setting up nebulizer with a liquid called DuoNeb, you can use it up to 4 times a day instead of the inhaler.  Prednisone for few days An antibiotic called Z-Pak Mucinex DM to help the mucus. Call if not gradually better   GO TO THE LAB : Get the blood work      STOP BY THE FIRST FLOOR:  get the XR    See you November 17

## 2019-11-07 NOTE — Progress Notes (Signed)
Pre visit review using our clinic review tool, if applicable. No additional management support is needed unless otherwise documented below in the visit note. 

## 2019-11-08 LAB — CBC WITH DIFFERENTIAL/PLATELET
Absolute Monocytes: 800 cells/uL (ref 200–950)
Basophils Absolute: 83 cells/uL (ref 0–200)
Basophils Relative: 0.9 %
Eosinophils Absolute: 184 cells/uL (ref 15–500)
Eosinophils Relative: 2 %
HCT: 40.4 % (ref 35.0–45.0)
Hemoglobin: 13.1 g/dL (ref 11.7–15.5)
Lymphs Abs: 2254 cells/uL (ref 850–3900)
MCH: 27.4 pg (ref 27.0–33.0)
MCHC: 32.4 g/dL (ref 32.0–36.0)
MCV: 84.5 fL (ref 80.0–100.0)
MPV: 11.9 fL (ref 7.5–12.5)
Monocytes Relative: 8.7 %
Neutro Abs: 5879 cells/uL (ref 1500–7800)
Neutrophils Relative %: 63.9 %
Platelets: 360 10*3/uL (ref 140–400)
RBC: 4.78 10*6/uL (ref 3.80–5.10)
RDW: 12.1 % (ref 11.0–15.0)
Total Lymphocyte: 24.5 %
WBC: 9.2 10*3/uL (ref 3.8–10.8)

## 2019-11-08 LAB — HEMOGLOBIN A1C
Hgb A1c MFr Bld: 7.4 % of total Hgb — ABNORMAL HIGH (ref <5.7)
Mean Plasma Glucose: 166 (calc)
eAG (mmol/L): 9.2 (calc)

## 2019-11-08 LAB — BASIC METABOLIC PANEL
BUN/Creatinine Ratio: 13 (calc) (ref 6–22)
BUN: 26 mg/dL — ABNORMAL HIGH (ref 7–25)
CO2: 28 mmol/L (ref 20–32)
Calcium: 9 mg/dL (ref 8.6–10.4)
Chloride: 101 mmol/L (ref 98–110)
Creat: 2.01 mg/dL — ABNORMAL HIGH (ref 0.60–0.88)
Glucose, Bld: 152 mg/dL — ABNORMAL HIGH (ref 65–99)
Potassium: 3.9 mmol/L (ref 3.5–5.3)
Sodium: 140 mmol/L (ref 135–146)

## 2019-11-08 NOTE — Assessment & Plan Note (Signed)
COPD asthma: Not well for few months, symptoms increased for few days. Continue Dulera, Incruse Ellipta. Continue albuterol inhaler, start a nebulizer with DuoNeb Prednisone for few days, unfortunately she has diabetes and does not have a way to check CBGs. Mucinex DM, Z-Pak Check a CBC and Chest x-ray Call if not gradually better HTN: BP seems okay,Continue amlodipine, Hyzaar, metoprolol, BMP DM: On Januvia, check a A1c Rec Pfizer booster and a flu shot once she is better. OSA: She stopped using the CPAP few days ago, recommend to go back as soon as she can Diarrhea, unclear etiology, observation for now. RTC schedule for November 17

## 2019-11-14 ENCOUNTER — Other Ambulatory Visit: Payer: Self-pay | Admitting: Internal Medicine

## 2019-11-27 DIAGNOSIS — Z23 Encounter for immunization: Secondary | ICD-10-CM | POA: Diagnosis not present

## 2019-11-29 ENCOUNTER — Ambulatory Visit (INDEPENDENT_AMBULATORY_CARE_PROVIDER_SITE_OTHER): Payer: Medicare Other | Admitting: Ophthalmology

## 2019-11-29 ENCOUNTER — Other Ambulatory Visit: Payer: Self-pay

## 2019-11-29 ENCOUNTER — Encounter (INDEPENDENT_AMBULATORY_CARE_PROVIDER_SITE_OTHER): Payer: Self-pay | Admitting: Ophthalmology

## 2019-11-29 DIAGNOSIS — H34832 Tributary (branch) retinal vein occlusion, left eye, with macular edema: Secondary | ICD-10-CM

## 2019-11-29 DIAGNOSIS — H353131 Nonexudative age-related macular degeneration, bilateral, early dry stage: Secondary | ICD-10-CM

## 2019-11-29 MED ORDER — BEVACIZUMAB CHEMO INJECTION 1.25MG/0.05ML SYRINGE FOR KALEIDOSCOPE
1.2500 mg | INTRAVITREAL | Status: AC | PRN
  Administered 2019-11-29: 1.25 mg via INTRAVITREAL

## 2019-11-29 NOTE — Assessment & Plan Note (Signed)

## 2019-11-29 NOTE — Progress Notes (Signed)
11/29/2019     CHIEF COMPLAINT Patient presents for Retina Follow Up   HISTORY OF PRESENT ILLNESS: Rebecca Elliott is a 86 y.o. female who presents to the clinic today for:   HPI    Retina Follow Up    Patient presents with  CRVO/BRVO.  In left eye.  Severity is moderate.  Duration of 9 weeks.  Since onset it is stable.  I, the attending physician,  performed the HPI with the patient and updated documentation appropriately.          Comments    9 Week BRVO f\u OS. Possible Avastin OS. OCT,  Patient missed follow-up at scheduled at 6-week interval, Pt states vision is no better or worse. Denies any new complaints.       Last edited by Hurman Horn, MD on 11/29/2019  3:01 PM. (History)      Referring physician: Colon Branch, MD [address] [place],  [place] [address]  HISTORICAL INFORMATION:   Selected notes from the MEDICAL RECORD NUMBER    Lab Results  Component Value Date   HGBA1C 7.4 (H) 11/07/2019     CURRENT MEDICATIONS: No current outpatient medications on file. (Ophthalmic Drugs)   No current facility-administered medications for this visit. (Ophthalmic Drugs)   Current Outpatient Medications (Other)  Medication Sig  . acetaminophen (TYLENOL) 500 MG tablet Take 500 mg by mouth as needed.  Marland Kitchen albuterol (PROAIR HFA) 108 (90 Base) MCG/ACT inhaler Inhale 2 puffs into the lungs every 6 (six) hours as needed for wheezing or shortness of breath.  Marland Kitchen amLODipine (NORVASC) 10 MG tablet Take 1 tablet (10 mg total) by mouth daily.  Marland Kitchen aspirin 81 MG tablet Take 81 mg by mouth daily.   Marland Kitchen atorvastatin (LIPITOR) 10 MG tablet Take 1 tablet (10 mg total) by mouth daily.  Marland Kitchen azithromycin (ZITHROMAX) 250 MG tablet Take 2 tablets by mouth first day, then 1 tablet for 4 additional days  . dextromethorphan-guaiFENesin (MUCINEX DM) 30-600 MG per 12 hr tablet Take 1 tablet by mouth daily.    . diclofenac sodium (VOLTAREN) 1 % GEL Apply 2 g topically 4 (four) times  daily.  Marland Kitchen docusate sodium (COLACE) 100 MG capsule Take 1-2 by mouth once a day.   . famotidine (PEPCID) 20 MG tablet Take 1 tablet (20 mg total) by mouth 2 (two) times daily.  Marland Kitchen ipratropium-albuterol (DUONEB) 0.5-2.5 (3) MG/3ML SOLN Take 3 mLs by nebulization every 6 (six) hours as needed.  . Lidocaine 3 % CREA Apply 1 application topically 3 (three) times daily as needed.  . loratadine (CLARITIN) 10 MG tablet Take 10 mg by mouth daily.    Marland Kitchen losartan-hydrochlorothiazide (HYZAAR) 100-12.5 MG tablet Take 1 tablet by mouth daily.  . metoprolol succinate (TOPROL-XL) 25 MG 24 hr tablet Take 1.5 tablets (37.5 mg total) by mouth daily.  . mometasone-formoterol (DULERA) 100-5 MCG/ACT AERO Inhale 2 puffs into the lungs in the morning and at bedtime.  . Multiple Vitamins-Minerals (CENTRUM SILVER) CHEW Chew by mouth.    . NONFORMULARY OR COMPOUNDED ITEM Compression Stockings Dx: M79.89  . pantoprazole (PROTONIX) 40 MG tablet Take 1 tablet (40 mg total) by mouth every other day.  . predniSONE (DELTASONE) 10 MG tablet 3 tabs x 3 days, 2 tabs x 3 days, 1 tab x 3 days  . sitaGLIPtin (JANUVIA) 50 MG tablet Take 1 tablet (50 mg total) by mouth daily.  Marland Kitchen umeclidinium bromide (INCRUSE ELLIPTA) 62.5 MCG/INH AEPB  Inhale 1 puff into the lungs daily at 12 noon.  . Wheat Dextrin (BENEFIBER DRINK MIX PO) Take by mouth 2 (two) times daily.     No current facility-administered medications for this visit. (Other)      REVIEW OF SYSTEMS:    ALLERGIES No Known Allergies  PAST MEDICAL HISTORY Past Medical History:  Diagnosis Date  . Allergic rhinitis   . COPD  and ASTHMA   . Depression   . Diabetes mellitus    as an adult  . DJD (degenerative joint disease)   . GERD (gastroesophageal reflux disease)    and HH w/ reflux  . History of colonic polyps   . Hyperlipidemia   . Hypertension    Past Surgical History:  Procedure Laterality Date  . ABDOMINAL HYSTERECTOMY     apparently no oophorectomy  .  APPENDECTOMY    . bilateral breast tumor    . BREAST EXCISIONAL BIOPSY Bilateral    one in her 20's and one in her 50's, both benign  . CATARACT EXTRACTION Bilateral    [place]  . CATARACT EXTRACTION, BILATERAL    . CHOLECYSTECTOMY    . TONSILLECTOMY    . TUBAL LIGATION      FAMILY HISTORY Family History  Problem Relation Age of Onset  . Breast cancer Sister        x 2  . Diabetes Father   . Diabetes Sister   . Heart attack Brother        early 26s  . Heart attack Son   . Pancreatic cancer Son   . Stomach cancer Sister   . Colon cancer Neg Hx     SOCIAL HISTORY Social History   Tobacco Use  . Smoking status: Former Smoker    Packs/day: 1.00    Years: 35.00    Pack years: 35.00    Types: Cigarettes    Quit date: 01/18/1993    Years since quitting: 26.8  . Smokeless tobacco: Never Used  Substance Use Topics  . Alcohol use: No    Alcohol/week: 0.0 standard drinks  . Drug use: No         OPHTHALMIC EXAM:  Base Eye Exam    Visual Acuity (Snellen - Linear)      Right Left   Dist cc 20/30 -2 20/200   Dist ph cc  NI   Correction: Glasses       Tonometry (Tonopen, 2:04 PM)      Right Left   Pressure 15 16       Pupils      Pupils Dark Light Shape React APD   Right PERRL 4 3 Round Slow None   Left PERRL 4 3 Round Slow None       Visual Fields (Counting fingers)      Left Right    Full Full       Neuro/Psych    Oriented x3: Yes   Mood/Affect: Normal       Dilation    Left eye: 1.0% Mydriacyl, 2.5% Phenylephrine @ 2:04 PM        Slit Lamp and Fundus Exam    External Exam      Right Left   External Normal Normal       Slit Lamp Exam      Right Left   Lids/Lashes Normal Normal   Conjunctiva/Sclera White and quiet White and quiet   Cornea Clear Clear   Anterior Chamber Deep and quiet Deep and quiet  Iris Round and reactive Round and reactive   Lens Posterior chamber intraocular lens Posterior chamber intraocular lens, Centered posterior  chamber intraocular lens   Anterior Vitreous Normal Normal       Fundus Exam      Right Left   Posterior Vitreous  Posterior vitreous detachment   Disc  Normal   C/D Ratio  0.1   Macula  Hard drusen, Intraretinal hemorrhage, Macular thickening, Retinal pigment epithelial mottling, Cystoid macular edema, Early age related macular degeneration, Microaneurysms   Vessels  AV nicking, Tortuous, and 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 Scan locations included subfoveal. Central Foveal Thickness: 273. Progression has been stable.   Left Eye Quality was good. Scan locations included subfoveal. Central Foveal Thickness: 510. Progression has worsened.   Notes CME from BRVO worsened at this interval of 9 weeks after missed appointments, will repeat injection today And consider repeat examination in 5 to 6 weeks       Intravitreal Injection, Pharmacologic Agent - OS - Left Eye       Time Out 11/29/2019. 3:03 PM. Confirmed correct patient, procedure, site, and patient consented.   Anesthesia Topical anesthesia was used. Anesthetic medications included [place] 3.5%.   Procedure Preparation included Ofloxacin , 10% betadine to eyelids, 5% betadine to ocular surface, Tobramycin 0.3%. A supplied needle was used.   Injection:  1.25 mg Bevacizumab (AVASTIN) SOLN   NDC: 70360-001-02, Lot: [id number]   Route: Intravitreal, Site: Left Eye, Waste: 0 mg  Post-op Post injection exam found visual acuity of at least counting fingers. The patient tolerated the procedure well. There were no complications. The patient received written and verbal post procedure care education. Post injection medications were not given.                 ASSESSMENT/PLAN:  Branch  retinal vein occlusion with macular edema of left eye The nature of branch retinal vein occlusion with macular edema was discussed.  The patient was given access to printed information.  The treatment options including continued observation looking for spontaneous resolution versus grid laser versus intravitreal Kenalog injection were discussed.  PRIMARY THERAPY CONSISTS of Anti-VEGF Therapies, AVASTIN, LUCENTIS AND EYLEA.  Their usage was discussed to assist in halting the progression of Macular Edema, in order to preserve, protect or improve acuity.  Additionally, at times, limited focal laser therapy is used in the management.  The risks and benefits of all these options were discussed with the patient.  The patient's questions were answered.  I would ask, worse at 9-week follow-up interval.  We will repeat injection today and resume 5 to 6-week follow-up as scheduled in the past  Early stage nonexudative age-related macular degeneration of both eyes The nature of age--related macular degeneration was discussed with the patient as well as the distinction between dry and wet types. Checking an Amsler Grid daily with advice to return immediately should a distortion develop, was given to the patient. The patient 's smoking status now and in the past was determined and advice based on the AREDS study was provided regarding the consumption of antioxidant supplements. AREDS 2 vitamin formulation was recommended. Consumption of dark leafy vegetables and fresh fruits of various  colors was recommended. Treatment modalities for wet macular degeneration particularly the use of intravitreal injections of anti-blood vessel growth factors was discussed with the patient. Avastin, Lucentis, and Eylea are the available options. On occasion, therapy includes the use of photodynamic therapy and thermal laser. Stressed to the patient do not rub eyes.  Patient was advised to check Amsler Grid daily and return immediately if  changes are noted. Instructions on using the grid were given to the patient. All patient questions were answered.      ICD-10-CM   1. Branch retinal vein occlusion with macular edema of left eye  H34.8320 OCT, Retina - OU - Both Eyes    Intravitreal Injection, Pharmacologic Agent - OS - Left Eye    Bevacizumab (AVASTIN) SOLN 1.25 mg  2. Early stage nonexudative age-related macular degeneration of both eyes  H35.3131     1.  2.  3.  Ophthalmic Meds Ordered this visit:  Meds ordered this encounter  Medications  . Bevacizumab (AVASTIN) SOLN 1.25 mg       Return in about 5 weeks (around 01/03/2020) for dilate, AVASTIN OCT, OS.  There are no Patient Instructions on file for this visit.   Explained the diagnoses, plan, and follow up with the patient and they expressed understanding.  Patient expressed understanding of the importance of proper follow up care.   Clent Demark Tony Friscia M.D. Diseases & Surgery of the Retina and Vitreous [facility] 11/29/19     Abbreviations: M myopia (nearsighted); A astigmatism; H hyperopia (farsighted); P presbyopia; Mrx spectacle prescription;  CTL contact lenses; OD right eye; OS left eye; OU both eyes  XT exotropia; ET esotropia; PEK punctate epithelial keratitis; PEE punctate epithelial erosions; DES dry eye syndrome; MGD meibomian gland dysfunction; ATs artificial tears; PFAT's preservative free artificial tears; [place] nuclear sclerotic cataract; PSC posterior subcapsular cataract; ERM epi-retinal membrane; PVD posterior vitreous detachment; RD retinal detachment; DM diabetes mellitus; DR diabetic retinopathy; NPDR non-proliferative diabetic retinopathy; PDR proliferative diabetic retinopathy; CSME clinically significant macular edema; DME diabetic macular edema; dbh dot blot hemorrhages; CWS cotton wool spot; POAG primary open angle glaucoma; C/D cup-to-disc ratio; HVF humphrey visual field; GVF goldmann visual field; OCT optical  coherence tomography; IOP intraocular pressure; BRVO Branch retinal vein occlusion; CRVO central retinal vein occlusion; CRAO central retinal artery occlusion; BRAO branch retinal artery occlusion; RT retinal tear; SB scleral buckle; PPV pars plana vitrectomy; VH Vitreous hemorrhage; PRP panretinal laser photocoagulation; IVK intravitreal kenalog; VMT vitreomacular traction; MH Macular hole;  NVD neovascularization of the disc; NVE neovascularization elsewhere; AREDS age related eye disease study; ARMD age related macular degeneration; POAG primary open angle glaucoma; EBMD epithelial/anterior basement membrane dystrophy; ACIOL anterior chamber intraocular lens; IOL intraocular lens; PCIOL posterior chamber intraocular lens; Phaco/IOL phacoemulsification with intraocular lens placement; [place] photorefractive keratectomy; LASIK laser assisted in situ keratomileusis; HTN hypertension; DM diabetes mellitus; COPD chronic obstructive pulmonary disease

## 2019-11-29 NOTE — Assessment & Plan Note (Signed)
The nature of branch retinal vein occlusion with macular edema was discussed.  The patient was given access to printed information.  The treatment options including continued observation looking for spontaneous resolution versus grid laser versus intravitreal Kenalog injection were discussed.  PRIMARY THERAPY CONSISTS of Anti-VEGF Therapies, AVASTIN, LUCENTIS AND EYLEA.  Their usage was discussed to assist in halting the progression of Macular Edema, in order to preserve, protect or improve acuity.  Additionally, at times, limited focal laser therapy is used in the management.  The risks and benefits of all these options were discussed with the patient.  The patient's questions were answered.  I would ask, worse at 9-week follow-up interval.  We will repeat injection today and resume 5 to 6-week follow-up as scheduled in the past

## 2019-12-05 ENCOUNTER — Other Ambulatory Visit: Payer: Self-pay | Admitting: Internal Medicine

## 2019-12-05 ENCOUNTER — Encounter: Payer: Self-pay | Admitting: Internal Medicine

## 2019-12-05 ENCOUNTER — Ambulatory Visit (INDEPENDENT_AMBULATORY_CARE_PROVIDER_SITE_OTHER): Payer: Medicare Other | Admitting: Internal Medicine

## 2019-12-05 ENCOUNTER — Other Ambulatory Visit: Payer: Self-pay

## 2019-12-05 VITALS — BP 156/66 | HR 66 | Temp 98.1°F | Resp 18 | Ht 66.0 in | Wt 214.1 lb

## 2019-12-05 DIAGNOSIS — I10 Essential (primary) hypertension: Secondary | ICD-10-CM | POA: Diagnosis not present

## 2019-12-05 DIAGNOSIS — J4540 Moderate persistent asthma, uncomplicated: Secondary | ICD-10-CM

## 2019-12-05 DIAGNOSIS — J449 Chronic obstructive pulmonary disease, unspecified: Secondary | ICD-10-CM

## 2019-12-05 DIAGNOSIS — Z23 Encounter for immunization: Secondary | ICD-10-CM

## 2019-12-05 LAB — BASIC METABOLIC PANEL
BUN: 22 mg/dL (ref 6–23)
CO2: 32 mEq/L (ref 19–32)
Calcium: 8.9 mg/dL (ref 8.4–10.5)
Chloride: 101 mEq/L (ref 96–112)
Creatinine, Ser: 1.5 mg/dL — ABNORMAL HIGH (ref 0.40–1.20)
GFR: 30.21 mL/min — ABNORMAL LOW (ref >60.00)
Glucose, Bld: 172 mg/dL — ABNORMAL HIGH (ref 70–99)
Potassium: 4 mEq/L (ref 3.5–5.1)
Sodium: 141 mEq/L (ref 135–145)

## 2019-12-05 MED ORDER — ARFORMOTEROL TARTRATE 15 MCG/2ML IN NEBU: 15.0000 ug | INHALATION_SOLUTION | Freq: Two times a day (BID) | RESPIRATORY_TRACT | 12 refills | Status: DC

## 2019-12-05 MED ORDER — BUDESONIDE 0.25 MG/2ML IN SUSP: 0.2500 mg | Freq: Two times a day (BID) | RESPIRATORY_TRACT | 12 refills | Status: DC

## 2019-12-05 NOTE — Patient Instructions (Signed)
Check the  blood pressure 2 or 3 times a month  BP GOAL is between 110/65 and  135/85. If it is consistently higher or lower, let me know  Everyday medications for emphysema: Incruse Ellipta: No change Dulera: We will try to switch her to two nebulizers Pulmicort twice a day and Brovana twice a day.  Rescue medicines: DuoNeb nebulizations Albuterol inhaler   GO TO THE LAB : Get the blood work     [place], [place] back for a checkup in 3 months

## 2019-12-05 NOTE — Progress Notes (Signed)
Subjective:    Patient ID: Rebecca Elliott, female    DOB: October 30, 1926, 86 y.o.   MRN: [id number]  DOS:  12/05/2019 Type of visit - description: rov, here with her daughter. She was recently seen with a COPD exacerbation, took antibiotics and prednisone, is now close to baseline however she still has above baseline white mucus production.  She is concerned about it. She is using her CPAP She complained of diarrhea, that resolved. No recent ambulatory BPs  Review of Systems See above   Past Medical History:  Diagnosis Date  . Allergic rhinitis   . COPD  and ASTHMA   . Depression   . Diabetes mellitus    as an adult  . DJD (degenerative joint disease)   . GERD (gastroesophageal reflux disease)    and HH w/ reflux  . History of colonic polyps   . Hyperlipidemia   . Hypertension     Past Surgical History:  Procedure Laterality Date  . ABDOMINAL HYSTERECTOMY     apparently no oophorectomy  . APPENDECTOMY    . bilateral breast tumor    . BREAST EXCISIONAL BIOPSY Bilateral    one in her 20's and one in her 50's, both benign  . CATARACT EXTRACTION Bilateral    [place]  . CATARACT EXTRACTION, BILATERAL    . CHOLECYSTECTOMY    . TONSILLECTOMY    . TUBAL LIGATION      Allergies as of 12/05/2019   No Known Allergies     Medication List       Accurate as of December 05, 2019 11:59 PM. If you have any questions, ask your nurse or doctor.        STOP taking these medications   azithromycin 250 MG tablet Commonly known as: ZITHROMAX Stopped by: Kathlene November, MD   predniSONE 10 MG tablet Commonly known as: DELTASONE Stopped by: Kathlene November, MD     TAKE these medications   albuterol 108 (90 Base) MCG/ACT inhaler Commonly known as: ProAir HFA Inhale 2 puffs into the lungs every 6 (six) hours as needed for wheezing or shortness of breath.   amLODipine 10 MG tablet Commonly known as: NORVASC Take 1 tablet (10 mg total) by mouth daily.   arformoterol 15 MCG/2ML  [place] Commonly known as: BROVANA Take 2 mLs (15 mcg total) by nebulization in the morning and at bedtime. Started by: Kathlene November, MD   aspirin 81 MG tablet Take 81 mg by mouth daily.   atorvastatin 10 MG tablet Commonly known as: LIPITOR Take 1 tablet (10 mg total) by mouth daily.   BENEFIBER DRINK MIX PO Take by mouth 2 (two) times daily.   budesonide 0.25 MG/2ML nebulizer solution Commonly known as: Pulmicort Take 2 mLs (0.25 mg total) by nebulization in the morning and at bedtime. Started by: Kathlene November, MD   Centrum [place] by mouth.   dextromethorphan-guaiFENesin 30-600 MG 12hr tablet Commonly known as: MUCINEX DM Take 1 tablet by mouth daily.   diclofenac sodium 1 % Gel Commonly known as: VOLTAREN Apply 2 g topically 4 (four) times daily.   docusate sodium 100 MG capsule Commonly known as: COLACE Take 1-2 by mouth once a day.   Dulera 100-5 MCG/ACT Aero Generic drug: mometasone-formoterol Inhale 2 puffs into the lungs in the morning and at bedtime.   famotidine 20 MG tablet Commonly known as: PEPCID Take 1 tablet (20 mg total) by mouth 2 (two) times daily.   Incruse Ellipta 62.5 MCG/INH Aepb  Generic drug: umeclidinium bromide Inhale 1 puff into the lungs daily. What changed: when to take this Changed by: Kathlene November, MD   ipratropium-albuterol 0.5-2.5 (3) MG/3ML Soln Commonly known as: DUONEB Take 3 mLs by nebulization every 6 (six) hours as needed.   Lidocaine 3 % Crea Apply 1 application topically 3 (three) times daily as needed.   loratadine 10 MG tablet Commonly known as: CLARITIN Take 10 mg by mouth daily.   losartan-hydrochlorothiazide 100-12.5 MG tablet Commonly known as: HYZAAR Take 1 tablet by mouth daily.   metoprolol succinate 25 MG 24 hr tablet Commonly known as: TOPROL-XL Take 1.5 tablets (37.5 mg total) by mouth daily.   NONFORMULARY OR COMPOUNDED ITEM Compression Stockings Dx: M79.89   pantoprazole 40 MG tablet Commonly  known as: PROTONIX Take 1 tablet (40 mg total) by mouth every other day.   sitaGLIPtin 50 MG tablet Commonly known as: Januvia Take 1 tablet (50 mg total) by mouth daily.   TYLENOL 500 MG tablet Generic drug: acetaminophen Take 500 mg by mouth as needed.          Objective:   Physical Exam BP (!) 156/66 (BP Location: Left Arm, Patient Position: Sitting, Cuff Size: Normal)   Pulse 66   Temp 98.1 F (36.7 C) (Oral)   Resp 18   Ht 5\' 6"  (1.676 m)   Wt 214 lb 2 oz (97.1 kg)   SpO2 94%   BMI 34.56 kg/m  General:   Well developed, NAD, BMI noted. HEENT:  Normocephalic . Face symmetric, atraumatic Lungs:  Decreased breath sounds Normal respiratory effort, no intercostal retractions, no accessory muscle use. Heart: RRR,  no murmur.  Lower extremities: no pretibial edema bilaterally  Skin: Not pale. Not jaundice Neurologic:  alert & oriented X3.  Speech normal, gait appropriate for age and unassisted Psych--  Cognition and judgment appear intact.  Cooperative with normal attention span and concentration.  Behavior appropriate. No anxious or depressed appearing.      Assessment       ASSESSMENT DM--no neuropathy, Metformin DC 04-2015 d/t creatinine HTN  Hyperlipidemia Depression COPD - Asthma Pulmonary nodules per CXR 03-2014, CT 03-2016, stable, no further CTs needed Anemia, chronic, on-off iron def  Colonoscopy 2004: No polyps EGD 2006 for dysphagia showed hiatal hernia, occult  stricture? Colonoscopy 2010 showed diverticuli Dysphagia: Barium swallow showed dysmotility 08-2014 DJD GERD OSA DX 05/2019  Macular degeneration currently L  Branch retinal vein occlusion  PLAN: COPD, asthma: Was seen with a COPD exacerbation, chest x-ray was nonacute, was treated with prednisone, Z-Pak and is near her baseline.  She is still producing more mucus than usual and would like to do something about it. Plan: On Dulera: Changed to Pulmicort twice daily and Brovana twice  daily Continue Incruse Ellipta Rescue inhalers: DuoNeb and albuterol. Above was discussed extensively with the patient's daughter. HTN:On Hyzaar, metoprolol, amlodipine, last creatinine slightly elevated, not on NSAIDs, check a BMP, encourage good hydration. Preventive care: Flu shot today, had a Covid booster RTC 3 months  This visit occurred during the SARS-CoV-2 public health emergency.  Safety protocols were in place, including screening questions prior to the visit, additional usage of staff PPE, and extensive cleaning of exam room while observing appropriate contact time as indicated for disinfecting solutions.

## 2019-12-05 NOTE — Progress Notes (Signed)
Pre visit review using our clinic review tool, if applicable. No additional management support is needed unless otherwise documented below in the visit note. 

## 2019-12-06 DIAGNOSIS — J45909 Unspecified asthma, uncomplicated: Secondary | ICD-10-CM | POA: Insufficient documentation

## 2019-12-06 NOTE — Assessment & Plan Note (Signed)
COPD, asthma: Was seen with a COPD exacerbation, chest x-ray was nonacute, was treated with prednisone, Z-Pak and is near her baseline.  She is still producing more mucus than usual and would like to do something about it. Plan: On Dulera: Changed to Pulmicort twice daily and Brovana twice daily Continue Incruse Ellipta Rescue inhalers: DuoNeb and albuterol. Above was discussed extensively with the patient's daughter. HTN:On Hyzaar, metoprolol, amlodipine, last creatinine slightly elevated, not on NSAIDs, check a BMP, encourage good hydration. Preventive care: Flu shot today, had a Covid booster RTC 3 months

## 2019-12-10 ENCOUNTER — Telehealth: Payer: Self-pay | Admitting: Pharmacist

## 2019-12-10 NOTE — Progress Notes (Signed)
Chronic Care Management Pharmacy Assistant   Name: Rebecca Elliott  MRN: [id number] DOB: [birth year]  Reason for Encounter: Medication Review/ Initial Questions for Pharmacist appointment on  PCP : Colon Branch, MD  Allergies:  No Known Allergies  Medications: Outpatient Encounter Medications as of 12/10/2019  Medication Sig  . acetaminophen (TYLENOL) 500 MG tablet Take 500 mg by mouth as needed.  Marland Kitchen albuterol (PROAIR HFA) 108 (90 Base) MCG/ACT inhaler Inhale 2 puffs into the lungs every 6 (six) hours as needed for wheezing or shortness of breath.  Marland Kitchen amLODipine (NORVASC) 10 MG tablet Take 1 tablet (10 mg total) by mouth daily.  Marland Kitchen arformoterol (BROVANA) 15 MCG/2ML NEBU Take 2 mLs (15 mcg total) by nebulization in the morning and at bedtime.  Marland Kitchen aspirin 81 MG tablet Take 81 mg by mouth daily.   Marland Kitchen atorvastatin (LIPITOR) 10 MG tablet Take 1 tablet (10 mg total) by mouth daily.  . budesonide (PULMICORT) 0.25 MG/2ML nebulizer solution Take 2 mLs (0.25 mg total) by nebulization in the morning and at bedtime.  Marland Kitchen dextromethorphan-guaiFENesin (MUCINEX DM) 30-600 MG per 12 hr tablet Take 1 tablet by mouth daily.    . diclofenac sodium (VOLTAREN) 1 % GEL Apply 2 g topically 4 (four) times daily.  Marland Kitchen docusate sodium (COLACE) 100 MG capsule Take 1-2 by mouth once a day.   . famotidine (PEPCID) 20 MG tablet Take 1 tablet (20 mg total) by mouth 2 (two) times daily.  Marland Kitchen ipratropium-albuterol (DUONEB) 0.5-2.5 (3) MG/3ML SOLN Take 3 mLs by nebulization every 6 (six) hours as needed.  . Lidocaine 3 % CREA Apply 1 application topically 3 (three) times daily as needed.  . loratadine (CLARITIN) 10 MG tablet Take 10 mg by mouth daily.    Marland Kitchen losartan-hydrochlorothiazide (HYZAAR) 100-12.5 MG tablet Take 1 tablet by mouth daily.  . metoprolol succinate (TOPROL-XL) 25 MG 24 hr tablet Take 1.5 tablets (37.5 mg total) by mouth daily.  . mometasone-formoterol (DULERA) 100-5 MCG/ACT AERO Inhale 2 puffs into the lungs in  the morning and at bedtime.  . Multiple Vitamins-Minerals (CENTRUM SILVER) CHEW Chew by mouth.    . NONFORMULARY OR COMPOUNDED ITEM Compression Stockings Dx: M79.89  . pantoprazole (PROTONIX) 40 MG tablet Take 1 tablet (40 mg total) by mouth every other day.  . sitaGLIPtin (JANUVIA) 50 MG tablet Take 1 tablet (50 mg total) by mouth daily.  Marland Kitchen umeclidinium bromide (INCRUSE ELLIPTA) 62.5 MCG/INH AEPB Inhale 1 puff into the lungs daily.  . Wheat Dextrin (BENEFIBER DRINK MIX PO) Take by mouth 2 (two) times daily.     No facility-administered encounter medications on file as of 12/10/2019.    Current Diagnosis: Patient Active Problem List   Diagnosis Date Noted  . Asthma 12/06/2019  . Early stage nonexudative age-related macular degeneration of both eyes 09/25/2019  . Branch retinal vein occlusion with macular edema of left eye 06/12/2019  . Retinal telangiectasia of left eye 04/23/2019  . Snores 04/23/2019  . Primary osteoarthritis of right knee 06/22/2016  . PCP NOTES >>>>>>>>>>>>> 11/19/2014  . Abdominal lymphadenopathy 08/09/2014  . Pulmonary nodules 05/01/2014  . Allergic rhinitis 04/18/2014  . Tremor 09/26/2013  . Elevated LFTs 08/13/2011  . Annual physical exam 07/10/2010  . Osteoarthritis 07/08/2009  . COLONIC POLYPS, ADENOMATOUS, HX OF 08/14/2008  . ALLERGIC RHINITIS 05/02/2007  . DMII (diabetes mellitus, type 2) ([place]) 12/12/2006  . Hyperlipidemia 10/10/2006  . DEPRESSION 10/10/2006  . GERD 10/10/2006  . HIATAL HERNIA WITH REFLUX 10/10/2006  .  Essential hypertension 03/13/2006  . COPD  03/13/2006    Goals Addressed   None    Reviewed chart for medication changes ahead of medication coordination call.  No OVs, Consults, or hospital visits since last care coordination call/Pharmacist visit. (If appropriate, list visit date, provider name)  No medication changes indicated OR if recent visit, treatment plan here.  BP Readings from Last 3 Encounters:  12/05/19 (!)  156/66  11/07/19 128/68  08/01/19 (!) 132/48    Lab Results  Component Value Date   HGBA1C 7.4 (H) 11/07/2019     Chronic Care Management   Outreach Note  12/18/2019 Name: Rebecca Elliott MRN: [id number] DOB: 1926/06/15  Referred by: Colon Branch, MD Reason for referral : Chronic Care Management   Third unsuccessful telephone outreach was attempted today. The patient was referred to the pharmacist for assistance with care management and care coordination.    Follow-Up:  Pharmacist Review   [place] Shannon, [place] [facility] [place] Pharmacist Assistant [phone]

## 2019-12-18 ENCOUNTER — Telehealth: Payer: Self-pay | Admitting: Pharmacist

## 2019-12-18 NOTE — Progress Notes (Signed)
Chronic Care Management Pharmacy Assistant   Name: Rebecca Elliott  MRN: [id number] DOB: [birth year]  Reason for Encounter: General Adherence  PCP : Colon Branch, MD  Allergies:  No Known Allergies  Medications: Outpatient Encounter Medications as of 12/18/2019  Medication Sig  . acetaminophen (TYLENOL) 500 MG tablet Take 500 mg by mouth as needed.  Marland Kitchen albuterol (PROAIR HFA) 108 (90 Base) MCG/ACT inhaler Inhale 2 puffs into the lungs every 6 (six) hours as needed for wheezing or shortness of breath.  Marland Kitchen amLODipine (NORVASC) 10 MG tablet Take 1 tablet (10 mg total) by mouth daily.  Marland Kitchen arformoterol (BROVANA) 15 MCG/2ML NEBU Take 2 mLs (15 mcg total) by nebulization in the morning and at bedtime.  Marland Kitchen aspirin 81 MG tablet Take 81 mg by mouth daily.   Marland Kitchen atorvastatin (LIPITOR) 10 MG tablet Take 1 tablet (10 mg total) by mouth daily.  . budesonide (PULMICORT) 0.25 MG/2ML nebulizer solution Take 2 mLs (0.25 mg total) by nebulization in the morning and at bedtime.  Marland Kitchen dextromethorphan-guaiFENesin (MUCINEX DM) 30-600 MG per 12 hr tablet Take 1 tablet by mouth daily.    . diclofenac sodium (VOLTAREN) 1 % GEL Apply 2 g topically 4 (four) times daily.  Marland Kitchen docusate sodium (COLACE) 100 MG capsule Take 1-2 by mouth once a day.   . famotidine (PEPCID) 20 MG tablet Take 1 tablet (20 mg total) by mouth 2 (two) times daily.  Marland Kitchen ipratropium-albuterol (DUONEB) 0.5-2.5 (3) MG/3ML SOLN Take 3 mLs by nebulization every 6 (six) hours as needed.  . Lidocaine 3 % CREA Apply 1 application topically 3 (three) times daily as needed.  . loratadine (CLARITIN) 10 MG tablet Take 10 mg by mouth daily.    Marland Kitchen losartan-hydrochlorothiazide (HYZAAR) 100-12.5 MG tablet Take 1 tablet by mouth daily.  . metoprolol succinate (TOPROL-XL) 25 MG 24 hr tablet Take 1.5 tablets (37.5 mg total) by mouth daily.  . mometasone-formoterol (DULERA) 100-5 MCG/ACT AERO Inhale 2 puffs into the lungs in the morning and at bedtime.  . Multiple  Vitamins-Minerals (CENTRUM SILVER) CHEW Chew by mouth.    . NONFORMULARY OR COMPOUNDED ITEM Compression Stockings Dx: M79.89  . pantoprazole (PROTONIX) 40 MG tablet Take 1 tablet (40 mg total) by mouth every other day.  . sitaGLIPtin (JANUVIA) 50 MG tablet Take 1 tablet (50 mg total) by mouth daily.  Marland Kitchen umeclidinium bromide (INCRUSE ELLIPTA) 62.5 MCG/INH AEPB Inhale 1 puff into the lungs daily.  . Wheat Dextrin (BENEFIBER DRINK MIX PO) Take by mouth 2 (two) times daily.     No facility-administered encounter medications on file as of 12/18/2019.    Current Diagnosis: Patient Active Problem List   Diagnosis Date Noted  . Asthma 12/06/2019  . Early stage nonexudative age-related macular degeneration of both eyes 09/25/2019  . Branch retinal vein occlusion with macular edema of left eye 06/12/2019  . Retinal telangiectasia of left eye 04/23/2019  . Snores 04/23/2019  . Primary osteoarthritis of right knee 06/22/2016  . PCP NOTES >>>>>>>>>>>>> 11/19/2014  . Abdominal lymphadenopathy 08/09/2014  . Pulmonary nodules 05/01/2014  . Allergic rhinitis 04/18/2014  . Tremor 09/26/2013  . Elevated LFTs 08/13/2011  . Annual physical exam 07/10/2010  . Osteoarthritis 07/08/2009  . COLONIC POLYPS, ADENOMATOUS, HX OF 08/14/2008  . ALLERGIC RHINITIS 05/02/2007  . DMII (diabetes mellitus, type 2) ([place]) 12/12/2006  . Hyperlipidemia 10/10/2006  . DEPRESSION 10/10/2006  . GERD 10/10/2006  . HIATAL HERNIA WITH REFLUX 10/10/2006  . Essential hypertension 03/13/2006  .  COPD  03/13/2006    Goals Addressed   None    Adherence Gap Information reports [employer].  Patient has met non-tobacco preventive care and screening.  Gap data KPN 09/18/19.  Follow-Up:  Pharmacist Review   [place] Shannon, [place] [facility] [place] Pharmacist Assistant [phone]

## 2019-12-19 ENCOUNTER — Telehealth: Payer: Self-pay | Admitting: Pharmacist

## 2019-12-19 NOTE — Progress Notes (Addendum)
Patient's daughter, [name], contacted me stating she was returning a phone call made to her by a clinical pharmacist. I attempted to schedule the patient for initial visit since she missed her appointment in August. [name] stated she was not interested in the CCM program at this time for her mother, but she would contact us if things changed in the future.  Fanny Skates, [place] Pharmacist Assistant [phone]  Reviewed by: De Blanch, PharmD, BCACP Clinical Pharmacist [place] [facility] [phone]

## 2019-12-28 ENCOUNTER — Other Ambulatory Visit: Payer: Self-pay | Admitting: Internal Medicine

## 2020-01-03 ENCOUNTER — Other Ambulatory Visit: Payer: Self-pay

## 2020-01-03 ENCOUNTER — Ambulatory Visit (INDEPENDENT_AMBULATORY_CARE_PROVIDER_SITE_OTHER): Payer: Medicare Other | Admitting: Ophthalmology

## 2020-01-03 ENCOUNTER — Encounter (INDEPENDENT_AMBULATORY_CARE_PROVIDER_SITE_OTHER): Payer: Self-pay | Admitting: Ophthalmology

## 2020-01-03 DIAGNOSIS — H353131 Nonexudative age-related macular degeneration, bilateral, early dry stage: Secondary | ICD-10-CM | POA: Diagnosis not present

## 2020-01-03 DIAGNOSIS — H34832 Tributary (branch) retinal vein occlusion, left eye, with macular edema: Secondary | ICD-10-CM

## 2020-01-03 MED ORDER — BEVACIZUMAB 2.5 MG/0.1ML IZ SOSY
2.5000 mg | PREFILLED_SYRINGE | INTRAVITREAL | Status: AC | PRN
  Administered 2020-01-03: 2.5 mg via INTRAVITREAL

## 2020-01-03 NOTE — Assessment & Plan Note (Signed)
Macular branch retinal vein occlusion left eye, vastly improved anatomy and some functional improvement status post institution of therapy since April 2021. Central CME has mostly abated  Clinically intraretinal hemorrhage persist thus suggesting ongoing activity. We will repeat injection today currently at 5-week interval and extend exam interval to 6 weeks next visit

## 2020-01-03 NOTE — Progress Notes (Signed)
01/03/2020     CHIEF COMPLAINT Patient presents for Retina Follow Up   HISTORY OF PRESENT ILLNESS: Rebecca Elliott is a 86 y.o. female who presents to the clinic today for:   HPI    Retina Follow Up    Patient presents with  CRVO/BRVO.  In left eye.  Severity is moderate.  Duration of 5 weeks.  Since onset it is stable.  I, the attending physician,  performed the HPI with the patient and updated documentation appropriately.          Comments    5 Week BRVO f\u OS. Possible Avastin OS. OCT  Pt states no changes in vision. Daughter states pt has c/o vision being worse, especially OD. BGL: did not check       Last edited by Tilda Franco on 01/03/2020  1:42 PM. (History)      Referring physician: Colon Branch, MD [address] [place],  [place] [address]  HISTORICAL INFORMATION:   Selected notes from the MEDICAL RECORD NUMBER    Lab Results  Component Value Date   HGBA1C 7.4 (H) 11/07/2019     CURRENT MEDICATIONS: No current outpatient medications on file. (Ophthalmic Drugs)   No current facility-administered medications for this visit. (Ophthalmic Drugs)   Current Outpatient Medications (Other)  Medication Sig  . acetaminophen (TYLENOL) 500 MG tablet Take 500 mg by mouth as needed.  Marland Kitchen albuterol (PROAIR HFA) 108 (90 Base) MCG/ACT inhaler Inhale 2 puffs into the lungs every 6 (six) hours as needed for wheezing or shortness of breath.  Marland Kitchen amLODipine (NORVASC) 10 MG tablet Take 1 tablet (10 mg total) by mouth daily.  Marland Kitchen arformoterol (BROVANA) 15 MCG/2ML NEBU Take 2 mLs (15 mcg total) by nebulization in the morning and at bedtime.  Marland Kitchen aspirin 81 MG tablet Take 81 mg by mouth daily.   Marland Kitchen atorvastatin (LIPITOR) 10 MG tablet Take 1 tablet (10 mg total) by mouth daily.  . budesonide (PULMICORT) 0.25 MG/2ML nebulizer solution Take 2 mLs (0.25 mg total) by nebulization in the morning and at bedtime.  Marland Kitchen dextromethorphan-guaiFENesin (MUCINEX DM) 30-600 MG per 12  hr tablet Take 1 tablet by mouth daily.    . diclofenac sodium (VOLTAREN) 1 % GEL Apply 2 g topically 4 (four) times daily.  Marland Kitchen docusate sodium (COLACE) 100 MG capsule Take 1-2 by mouth once a day.   . famotidine (PEPCID) 20 MG tablet Take 1 tablet (20 mg total) by mouth 2 (two) times daily.  Marland Kitchen ipratropium-albuterol (DUONEB) 0.5-2.5 (3) MG/3ML SOLN Take 3 mLs by nebulization every 6 (six) hours as needed.  . Lidocaine 3 % CREA Apply 1 application topically 3 (three) times daily as needed.  . loratadine (CLARITIN) 10 MG tablet Take 10 mg by mouth daily.    Marland Kitchen losartan-hydrochlorothiazide (HYZAAR) 100-12.5 MG tablet Take 1 tablet by mouth daily.  . metoprolol succinate (TOPROL-XL) 25 MG 24 hr tablet Take 1.5 tablets (37.5 mg total) by mouth daily.  . mometasone-formoterol (DULERA) 100-5 MCG/ACT AERO Inhale 2 puffs into the lungs in the morning and at bedtime.  . Multiple Vitamins-Minerals (CENTRUM SILVER) CHEW Chew by mouth.    . NONFORMULARY OR COMPOUNDED ITEM Compression Stockings Dx: M79.89  . pantoprazole (PROTONIX) 40 MG tablet Take 1 tablet (40 mg total) by mouth every other day.  . sitaGLIPtin (JANUVIA) 50 MG tablet Take 1 tablet (50 mg total) by mouth daily.  Marland Kitchen umeclidinium bromide (INCRUSE ELLIPTA) 62.5 MCG/INH AEPB Inhale  1 puff into the lungs daily.  . Wheat Dextrin (BENEFIBER DRINK MIX PO) Take by mouth 2 (two) times daily.     No current facility-administered medications for this visit. (Other)      REVIEW OF SYSTEMS: ROS    Positive for: Endocrine   Last edited by Tilda Franco on 01/03/2020  1:42 PM. (History)       ALLERGIES No Known Allergies  PAST MEDICAL HISTORY Past Medical History:  Diagnosis Date  . Allergic rhinitis   . COPD  and ASTHMA   . Depression   . Diabetes mellitus    as an adult  . DJD (degenerative joint disease)   . GERD (gastroesophageal reflux disease)    and HH w/ reflux  . History of colonic polyps   . Hyperlipidemia   .  Hypertension    Past Surgical History:  Procedure Laterality Date  . ABDOMINAL HYSTERECTOMY     apparently no oophorectomy  . APPENDECTOMY    . bilateral breast tumor    . BREAST EXCISIONAL BIOPSY Bilateral    one in her 20's and one in her 50's, both benign  . CATARACT EXTRACTION Bilateral    [place]  . CATARACT EXTRACTION, BILATERAL    . CHOLECYSTECTOMY    . TONSILLECTOMY    . TUBAL LIGATION      FAMILY HISTORY Family History  Problem Relation Age of Onset  . Breast cancer Sister        x 2  . Diabetes Father   . Diabetes Sister   . Heart attack Brother        early 73s  . Heart attack Son   . Pancreatic cancer Son   . Stomach cancer Sister   . Colon cancer Neg Hx     SOCIAL HISTORY Social History   Tobacco Use  . Smoking status: Former Smoker    Packs/day: 1.00    Years: 35.00    Pack years: 35.00    Types: Cigarettes    Quit date: 01/18/1993    Years since quitting: 26.9  . Smokeless tobacco: Never Used  Substance Use Topics  . Alcohol use: No    Alcohol/week: 0.0 standard drinks  . Drug use: No         OPHTHALMIC EXAM: Base Eye Exam    Visual Acuity (ETDRS)      Right Left   Dist cc 20/30 -1 20/200   Dist ph cc  20/80 -1   Correction: Glasses       Tonometry (Tonopen, 1:46 PM)      Right Left   Pressure 14 10       Pupils      Pupils Dark Light Shape React APD   Right PERRL 3 3 Round Minimal None   Left PERRL 3 3 Round Minimal None       Visual Fields (Counting fingers)      Left Right    Full Full       Neuro/Psych    Oriented x3: Yes   Mood/Affect: Normal       Dilation    Left eye: 1.0% Mydriacyl, 2.5% Phenylephrine @ 1:46 PM        Slit Lamp and Fundus Exam    External Exam      Right Left   External Normal Normal       Slit Lamp Exam      Right Left   Lids/Lashes Normal Normal   Conjunctiva/Sclera White and quiet White  and quiet   Cornea Clear Clear   Anterior Chamber Deep and quiet Deep and quiet   Iris Round  and reactive Round and reactive   Lens Posterior chamber intraocular lens Posterior chamber intraocular lens, Centered posterior chamber intraocular lens   Anterior Vitreous Normal Normal       Fundus Exam      Right Left   Posterior Vitreous  Posterior vitreous detachment   Disc  Normal   C/D Ratio  0.1   Macula  Hard drusen, Intraretinal hemorrhage, Macular thickening, Retinal pigment epithelial mottling, Early age related macular degeneration, Microaneurysms, no cystoid macular edema   Vessels  AV nicking, Tortuous, and 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 Scan locations included subfoveal. Central Foveal Thickness: 258. Progression has been stable. Findings include retinal drusen , abnormal foveal contour.   Left Eye Quality was good. Scan locations included subfoveal. Central Foveal Thickness: 308. Progression has improved.   Notes OD, no signs of active maculopathy.  Dry ARMD persists no signs of CNVM.  OS  with much less cystoid macular edema as compared to onset of disease condition April  2021,, currently in 5-week interval examination, will repeat exam injection of Avastin today and exam in 6 weeks       Intravitreal Injection, Pharmacologic Agent - OS - Left Eye       Time Out 01/03/2020. 2:34 PM. Confirmed correct patient, procedure, site, and patient consented.   Anesthesia Topical anesthesia was used. Anesthetic medications included Akten 3.5%.   Procedure Preparation included Ofloxacin , 10% betadine to eyelids, 5% betadine to ocular surface, Tobramycin 0.3%. A 30 gauge needle was used.   Injection:  2.5 mg Bevacizumab (AVASTIN) 2.5mg /0.16mL SOSY   NDC: 27782-423-53, Lot: [id number]   Route: Intravitreal, Site: Left  Eye  Post-op Post injection exam found visual acuity of at least counting fingers. The patient tolerated the procedure well. There were no complications. The patient received written and verbal post procedure care education. Post injection medications were not given.                 ASSESSMENT/PLAN:  Early stage nonexudative age-related macular degeneration of both eyes The nature of age--related macular degeneration was discussed with the patient as well as the distinction between dry and wet types. Checking an Amsler Grid daily with advice to return immediately should a distortion develop, was given to the patient. The patient 's smoking status now and in the past was determined and advice based on the AREDS study was provided regarding the consumption of antioxidant supplements. AREDS 2 vitamin formulation was recommended. Consumption of dark leafy vegetables and fresh fruits of various colors was recommended. Treatment modalities for wet macular degeneration particularly the use of intravitreal injections of anti-blood vessel growth factors was discussed with the patient. Avastin, Lucentis, and Eylea are the available options. On occasion, therapy includes the use of photodynamic therapy and thermal laser. Stressed to the patient do not rub eyes.  Patient was advised to check Amsler Grid daily and return immediately if changes are noted. Instructions on using the grid were given to the patient. All patient questions were answered.      ICD-10-CM   1.  Branch retinal vein occlusion with macular edema of left eye  H34.8320 OCT, Retina - OU - Both Eyes    Intravitreal Injection, Pharmacologic Agent - OS - Left Eye    bevacizumab (AVASTIN) SOSY 2.5 mg  2. Early stage nonexudative age-related macular degeneration of both eyes  H35.3131     1.  Improve macular anatomy left eye on intravitreal Avastin for CME secondary to BR VO.  Repeat injection today  2.  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  Ophthalmic Meds Ordered this visit:  Meds ordered this encounter  Medications  . bevacizumab (AVASTIN) SOSY 2.5 mg       Return in about 6 weeks (around 02/14/2020) for dilate, OS, AVASTIN OCT.  There are no Patient Instructions on file for this visit.   Explained the diagnoses, plan, and follow up with the patient and they expressed understanding.  Patient expressed understanding of the importance of proper follow up care.   Clent Demark Mazikeen Hehn M.D. Diseases & Surgery of the Retina and Vitreous [facility] [place] 01/03/20     Abbreviations: M myopia (nearsighted); A astigmatism; H hyperopia (farsighted); P presbyopia; Mrx spectacle prescription;  CTL contact lenses; OD right eye; OS left eye; OU both eyes  XT exotropia; ET esotropia; PEK punctate epithelial keratitis; PEE punctate epithelial erosions; DES dry eye syndrome; MGD meibomian gland dysfunction; ATs artificial tears; PFAT's preservative free artificial tears; [place] nuclear sclerotic cataract; PSC posterior subcapsular cataract; ERM epi-retinal membrane; PVD posterior vitreous detachment; RD retinal detachment; DM diabetes mellitus; DR diabetic retinopathy; NPDR non-proliferative diabetic retinopathy; PDR proliferative diabetic retinopathy; CSME clinically significant macular edema; DME diabetic macular edema; dbh dot blot hemorrhages; CWS cotton wool spot; POAG primary open angle glaucoma; C/D cup-to-disc ratio; HVF humphrey visual field; GVF goldmann visual field; OCT optical coherence tomography; IOP intraocular pressure; BRVO Branch retinal vein occlusion; CRVO central retinal vein occlusion; CRAO central retinal artery occlusion; BRAO branch retinal artery occlusion; RT retinal tear; SB scleral buckle; PPV pars plana vitrectomy; VH Vitreous hemorrhage; PRP panretinal laser photocoagulation; IVK intravitreal kenalog; VMT vitreomacular traction; MH Macular hole;  NVD  neovascularization of the disc; NVE neovascularization elsewhere; AREDS age related eye disease study; ARMD age related macular degeneration; POAG primary open angle glaucoma; EBMD epithelial/anterior basement membrane dystrophy; ACIOL anterior chamber intraocular lens; IOL intraocular lens; PCIOL posterior chamber intraocular lens; Phaco/IOL phacoemulsification with intraocular lens placement; [place] photorefractive keratectomy; LASIK laser assisted in situ keratomileusis; HTN hypertension; DM diabetes mellitus; COPD chronic obstructive pulmonary disease

## 2020-01-03 NOTE — Assessment & Plan Note (Signed)

## 2020-01-22 ENCOUNTER — Other Ambulatory Visit: Payer: Self-pay

## 2020-01-22 MED ORDER — IPRATROPIUM-ALBUTEROL 0.5-2.5 (3) MG/3ML IN SOLN: 3.0000 mL | Freq: Four times a day (QID) | RESPIRATORY_TRACT | 3 refills | Status: AC | PRN

## 2020-01-22 MED ORDER — ARFORMOTEROL TARTRATE 15 MCG/2ML IN NEBU: 15.0000 ug | INHALATION_SOLUTION | Freq: Two times a day (BID) | RESPIRATORY_TRACT | 3 refills | Status: DC

## 2020-01-22 MED ORDER — BUDESONIDE 0.25 MG/2ML IN SUSP: 0.2500 mg | Freq: Two times a day (BID) | RESPIRATORY_TRACT | 3 refills | Status: DC

## 2020-02-14 ENCOUNTER — Encounter (INDEPENDENT_AMBULATORY_CARE_PROVIDER_SITE_OTHER): Payer: Self-pay | Admitting: Ophthalmology

## 2020-02-14 ENCOUNTER — Ambulatory Visit (INDEPENDENT_AMBULATORY_CARE_PROVIDER_SITE_OTHER): Payer: Medicare Other | Admitting: Ophthalmology

## 2020-02-14 ENCOUNTER — Other Ambulatory Visit: Payer: Self-pay

## 2020-02-14 DIAGNOSIS — H353131 Nonexudative age-related macular degeneration, bilateral, early dry stage: Secondary | ICD-10-CM | POA: Diagnosis not present

## 2020-02-14 DIAGNOSIS — H34832 Tributary (branch) retinal vein occlusion, left eye, with macular edema: Secondary | ICD-10-CM

## 2020-02-14 MED ORDER — BEVACIZUMAB 2.5 MG/0.1ML IZ SOSY
2.5000 mg | PREFILLED_SYRINGE | INTRAVITREAL | Status: AC | PRN
  Administered 2020-02-14: 2.5 mg via INTRAVITREAL

## 2020-02-14 NOTE — Progress Notes (Signed)
02/14/2020     CHIEF COMPLAINT Patient presents for Retina Follow Up (6 WK FU OS, POSS AVASTIN OS///Pt reports stable vision OU. Pt denies any new F/F, pain, or pressure OU. )   HISTORY OF PRESENT ILLNESS: Rebecca Elliott is a 86 y.o. female who presents to the clinic today for:   HPI    Retina Follow Up    Patient presents with  CRVO/BRVO.  In left eye.  This started 6 weeks ago.  Severity is mild.  Duration of 6 weeks.  Since onset it is stable. Additional comments: 6 WK FU OS, POSS AVASTIN OS   Pt reports stable vision OU. Pt denies any new F/F, pain, or pressure OU.        Last edited by Nichola Sizer D on 02/14/2020  2:07 PM. (History)      Referring physician: Colon Branch, MD [address] [place],  [place] [address]  HISTORICAL INFORMATION:   Selected notes from the MEDICAL RECORD NUMBER    Lab Results  Component Value Date   HGBA1C 7.4 (H) 11/07/2019     CURRENT MEDICATIONS: No current outpatient medications on file. (Ophthalmic Drugs)   No current facility-administered medications for this visit. (Ophthalmic Drugs)   Current Outpatient Medications (Other)  Medication Sig  . acetaminophen (TYLENOL) 500 MG tablet Take 500 mg by mouth as needed.  Marland Kitchen albuterol (PROAIR HFA) 108 (90 Base) MCG/ACT inhaler Inhale 2 puffs into the lungs every 6 (six) hours as needed for wheezing or shortness of breath.  Marland Kitchen amLODipine (NORVASC) 10 MG tablet Take 1 tablet (10 mg total) by mouth daily.  Marland Kitchen arformoterol (BROVANA) 15 MCG/2ML NEBU Take 2 mLs (15 mcg total) by nebulization in the morning and at bedtime.  Marland Kitchen aspirin 81 MG tablet Take 81 mg by mouth daily.   Marland Kitchen atorvastatin (LIPITOR) 10 MG tablet Take 1 tablet (10 mg total) by mouth daily.  . budesonide (PULMICORT) 0.25 MG/2ML nebulizer solution Take 2 mLs (0.25 mg total) by nebulization in the morning and at bedtime.  Marland Kitchen dextromethorphan-guaiFENesin (MUCINEX DM) 30-600 MG per 12 hr tablet Take 1 tablet by mouth  daily.    . diclofenac sodium (VOLTAREN) 1 % GEL Apply 2 g topically 4 (four) times daily.  Marland Kitchen docusate sodium (COLACE) 100 MG capsule Take 1-2 by mouth once a day.   . famotidine (PEPCID) 20 MG tablet Take 1 tablet (20 mg total) by mouth 2 (two) times daily.  Marland Kitchen ipratropium-albuterol (DUONEB) 0.5-2.5 (3) MG/3ML SOLN Take 3 mLs by nebulization every 6 (six) hours as needed.  . Lidocaine 3 % CREA Apply 1 application topically 3 (three) times daily as needed.  . loratadine (CLARITIN) 10 MG tablet Take 10 mg by mouth daily.    Marland Kitchen losartan-hydrochlorothiazide (HYZAAR) 100-12.5 MG tablet Take 1 tablet by mouth daily.  . metoprolol succinate (TOPROL-XL) 25 MG 24 hr tablet Take 1.5 tablets (37.5 mg total) by mouth daily.  . mometasone-formoterol (DULERA) 100-5 MCG/ACT AERO Inhale 2 puffs into the lungs in the morning and at bedtime.  . Multiple Vitamins-Minerals (CENTRUM SILVER) CHEW Chew by mouth.    . NONFORMULARY OR COMPOUNDED ITEM Compression Stockings Dx: M79.89  . pantoprazole (PROTONIX) 40 MG tablet Take 1 tablet (40 mg total) by mouth every other day.  . sitaGLIPtin (JANUVIA) 50 MG tablet Take 1 tablet (50 mg total) by mouth daily.  Marland Kitchen umeclidinium bromide (INCRUSE ELLIPTA) 62.5 MCG/INH AEPB Inhale 1 puff into the lungs daily.  Marland Kitchen  Wheat Dextrin (BENEFIBER DRINK MIX PO) Take by mouth 2 (two) times daily.     No current facility-administered medications for this visit. (Other)      REVIEW OF SYSTEMS:    ALLERGIES No Known Allergies  PAST MEDICAL HISTORY Past Medical History:  Diagnosis Date  . Allergic rhinitis   . COPD  and ASTHMA   . Depression   . Diabetes mellitus    as an adult  . DJD (degenerative joint disease)   . GERD (gastroesophageal reflux disease)    and HH w/ reflux  . History of colonic polyps   . Hyperlipidemia   . Hypertension    Past Surgical History:  Procedure Laterality Date  . ABDOMINAL HYSTERECTOMY     apparently no oophorectomy  . APPENDECTOMY    .  bilateral breast tumor    . BREAST EXCISIONAL BIOPSY Bilateral    one in her 20's and one in her 50's, both benign  . CATARACT EXTRACTION Bilateral    [place]  . CATARACT EXTRACTION, BILATERAL    . CHOLECYSTECTOMY    . TONSILLECTOMY    . TUBAL LIGATION      FAMILY HISTORY Family History  Problem Relation Age of Onset  . Breast cancer Sister        x 2  . Diabetes Father   . Diabetes Sister   . Heart attack Brother        early 75s  . Heart attack Son   . Pancreatic cancer Son   . Stomach cancer Sister   . Colon cancer Neg Hx     SOCIAL HISTORY Social History   Tobacco Use  . Smoking status: Former Smoker    Packs/day: 1.00    Years: 35.00    Pack years: 35.00    Types: Cigarettes    Quit date: 01/18/1993    Years since quitting: 27.0  . Smokeless tobacco: Never Used  Substance Use Topics  . Alcohol use: No    Alcohol/week: 0.0 standard drinks  . Drug use: No         OPHTHALMIC EXAM:  Base Eye Exam    Visual Acuity (ETDRS)      Right Left   Dist cc 20/40 +1 20/200   Dist ph cc 20/30 -1 20/70 -1   Correction: Glasses       Tonometry (Tonopen, 2:13 PM)      Right Left   Pressure 16 13       Pupils      Pupils Dark Light Shape React APD   Right PERRL 3 3 Round Brisk None   Left PERRL 3 3 Round Brisk None       Visual Fields (Counting fingers)      Left Right    Full Full       Extraocular Movement      Right Left    Full Full       Neuro/Psych    Oriented x3: Yes   Mood/Affect: Normal       Dilation    Left eye: 1.0% Mydriacyl, 2.5% Phenylephrine @ 2:13 PM        Slit Lamp and Fundus Exam    External Exam      Right Left   External Normal Normal       Slit Lamp Exam      Right Left   Lids/Lashes Normal Normal   Conjunctiva/Sclera White and quiet White and quiet   Cornea Clear Clear   Anterior  Chamber Deep and quiet Deep and quiet   Iris Round and reactive Round and reactive   Lens Posterior chamber intraocular lens Posterior  chamber intraocular lens, Centered posterior chamber intraocular lens   Anterior Vitreous Normal Normal       Fundus Exam      Right Left   Posterior Vitreous  Posterior vitreous detachment   Disc  Normal   C/D Ratio  0.1   Macula  Hard drusen, Intraretinal hemorrhage, Macular thickening, Retinal pigment epithelial mottling, Early age related macular degeneration, Microaneurysms, no cystoid macular edema   Vessels  AV nicking, Tortuous, and 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 Scan locations included subfoveal. Central Foveal Thickness: 273. Progression has been stable. Findings include abnormal foveal contour, retinal drusen , no IRF, no SRF.   Left Eye Quality was good. Scan locations included subfoveal. Central Foveal Thickness: 305. Progression has improved. Findings include no SRF, retinal drusen .   Notes Overall much less CME OS from retinal vein occlusion, stable now at 6 weeks.  We will repeat injection today and evaluation again in 6 weeks.       Intravitreal Injection, Pharmacologic Agent - OS - Left Eye       Time Out 02/14/2020. 3:11 PM. Confirmed correct patient, procedure, site, and patient consented.   Anesthesia Topical anesthesia was used. Anesthetic medications included Akten 3.5%.   Procedure Preparation included Ofloxacin , 10% betadine to eyelids, 5% betadine to ocular surface, Tobramycin 0.3%. A 30 gauge needle was used.   Injection:  2.5 mg Bevacizumab (AVASTIN) 2.5mg /0.61mL SOSY   NDC: 59563-875-64, Lot: [id number]   Route: Intravitreal, Site: Left Eye  Post-op Post injection exam found visual acuity of at least counting fingers. The patient tolerated the procedure well. There were no complications. The patient  received written and verbal post procedure care education. Post injection medications were not given.                 ASSESSMENT/PLAN:  Early stage nonexudative age-related macular degeneration of both eyes Stable OU, no signs of CNVM  Branch retinal vein occlusion with macular edema of left eye Improved overall CME status post intravitreal Avastin, 6-week interval today repeat injection today and examination again in 6 weeks      ICD-10-CM   1. Branch retinal vein occlusion with macular edema of left eye  H34.8320 OCT, Retina - OU - Both Eyes    Intravitreal Injection, Pharmacologic Agent - OS - Left Eye    bevacizumab (AVASTIN) SOSY 2.5 mg  2. Early stage nonexudative age-related macular degeneration of both eyes  H35.3131     1.  Excellent anatomic response yet with clinically still with intraretinal hemorrhages and microaneurysms in the macula left eye and minor CME by OCT OS 6 weeks post Avastin for branch retinal vein occlusion.  Repeat today  2.  Dilate OU next in 6 weeks  3.  Possible treatment Avastin OS next in 6 weeks  Ophthalmic Meds Ordered this visit:  Meds ordered this encounter  Medications  . bevacizumab (AVASTIN) SOSY 2.5 mg       Return in about 6 weeks (around 03/27/2020) for DILATE OU, AVASTIN OCT, OS.  There  are no Patient Instructions on file for this visit.   Explained the diagnoses, plan, and follow up with the patient and they expressed understanding.  Patient expressed understanding of the importance of proper follow up care.   Clent Demark Crystin Lechtenberg M.D. Diseases & Surgery of the Retina and Vitreous [facility] 02/14/20     Abbreviations: M myopia (nearsighted); A astigmatism; H hyperopia (farsighted); P presbyopia; Mrx spectacle prescription;  CTL contact lenses; OD right eye; OS left eye; OU both eyes  XT exotropia; ET esotropia; PEK punctate epithelial keratitis; PEE punctate epithelial erosions; DES dry eye syndrome; MGD  meibomian gland dysfunction; ATs artificial tears; PFAT's preservative free artificial tears; [place] nuclear sclerotic cataract; PSC posterior subcapsular cataract; ERM epi-retinal membrane; PVD posterior vitreous detachment; RD retinal detachment; DM diabetes mellitus; DR diabetic retinopathy; NPDR non-proliferative diabetic retinopathy; PDR proliferative diabetic retinopathy; CSME clinically significant macular edema; DME diabetic macular edema; dbh dot blot hemorrhages; CWS cotton wool spot; POAG primary open angle glaucoma; C/D cup-to-disc ratio; HVF humphrey visual field; GVF goldmann visual field; OCT optical coherence tomography; IOP intraocular pressure; BRVO Branch retinal vein occlusion; CRVO central retinal vein occlusion; CRAO central retinal artery occlusion; BRAO branch retinal artery occlusion; RT retinal tear; SB scleral buckle; PPV pars plana vitrectomy; VH Vitreous hemorrhage; PRP panretinal laser photocoagulation; IVK intravitreal kenalog; VMT vitreomacular traction; MH Macular hole;  NVD neovascularization of the disc; NVE neovascularization elsewhere; AREDS age related eye disease study; ARMD age related macular degeneration; POAG primary open angle glaucoma; EBMD epithelial/anterior basement membrane dystrophy; ACIOL anterior chamber intraocular lens; IOL intraocular lens; PCIOL posterior chamber intraocular lens; Phaco/IOL phacoemulsification with intraocular lens placement; [place] photorefractive keratectomy; LASIK laser assisted in situ keratomileusis; HTN hypertension; DM diabetes mellitus; COPD chronic obstructive pulmonary disease

## 2020-02-14 NOTE — Assessment & Plan Note (Signed)
Stable OU, no signs of CNVM

## 2020-02-14 NOTE — Assessment & Plan Note (Signed)
Improved overall CME status post intravitreal Avastin, 6-week interval today repeat injection today and examination again in 6 weeks

## 2020-03-04 ENCOUNTER — Other Ambulatory Visit: Payer: Self-pay | Admitting: Internal Medicine

## 2020-03-06 ENCOUNTER — Ambulatory Visit (INDEPENDENT_AMBULATORY_CARE_PROVIDER_SITE_OTHER): Payer: Medicare Other | Admitting: Internal Medicine

## 2020-03-06 ENCOUNTER — Other Ambulatory Visit: Payer: Self-pay

## 2020-03-06 VITALS — BP 154/76 | HR 67 | Temp 97.7°F | Ht 66.0 in | Wt 211.8 lb

## 2020-03-06 DIAGNOSIS — E785 Hyperlipidemia, unspecified: Secondary | ICD-10-CM | POA: Diagnosis not present

## 2020-03-06 DIAGNOSIS — M25561 Pain in right knee: Secondary | ICD-10-CM

## 2020-03-06 DIAGNOSIS — J4540 Moderate persistent asthma, uncomplicated: Secondary | ICD-10-CM

## 2020-03-06 DIAGNOSIS — J411 Mucopurulent chronic bronchitis: Secondary | ICD-10-CM | POA: Diagnosis not present

## 2020-03-06 DIAGNOSIS — G8929 Other chronic pain: Secondary | ICD-10-CM | POA: Diagnosis not present

## 2020-03-06 DIAGNOSIS — I10 Essential (primary) hypertension: Secondary | ICD-10-CM

## 2020-03-06 DIAGNOSIS — R296 Repeated falls: Secondary | ICD-10-CM

## 2020-03-06 DIAGNOSIS — R269 Unspecified abnormalities of gait and mobility: Secondary | ICD-10-CM

## 2020-03-06 DIAGNOSIS — M25562 Pain in left knee: Secondary | ICD-10-CM | POA: Diagnosis not present

## 2020-03-06 DIAGNOSIS — E119 Type 2 diabetes mellitus without complications: Secondary | ICD-10-CM | POA: Diagnosis not present

## 2020-03-06 LAB — COMPREHENSIVE METABOLIC PANEL
ALT: 13 U/L (ref 0–35)
AST: 21 U/L (ref 0–37)
Albumin: 3.6 g/dL (ref 3.5–5.2)
Alkaline Phosphatase: 61 U/L (ref 39–117)
BUN: 24 mg/dL — ABNORMAL HIGH (ref 6–23)
CO2: 32 mEq/L (ref 19–32)
Calcium: 9.2 mg/dL (ref 8.4–10.5)
Chloride: 102 mEq/L (ref 96–112)
Creatinine, Ser: 1.46 mg/dL — ABNORMAL HIGH (ref 0.40–1.20)
GFR: 31.15 mL/min — ABNORMAL LOW (ref >60.00)
Glucose, Bld: 145 mg/dL — ABNORMAL HIGH (ref 70–99)
Potassium: 4 mEq/L (ref 3.5–5.1)
Sodium: 140 mEq/L (ref 135–145)
Total Bilirubin: 0.4 mg/dL (ref 0.2–1.2)
Total Protein: 7.6 g/dL (ref 6.0–8.3)

## 2020-03-06 LAB — LIPID PANEL
Cholesterol: 114 mg/dL (ref 0–200)
HDL: 34.5 mg/dL — ABNORMAL LOW (ref >39.00)
LDL Cholesterol: 57 mg/dL (ref 0–99)
NonHDL: 79.28
Total CHOL/HDL Ratio: 3
Triglycerides: 113 mg/dL (ref 0.0–149.0)
VLDL: 22.6 mg/dL (ref 0.0–40.0)

## 2020-03-06 LAB — TSH: TSH: 2.18 u[IU]/mL (ref 0.35–4.50)

## 2020-03-06 LAB — HEMOGLOBIN A1C: Hgb A1c MFr Bld: 8.4 % — ABNORMAL HIGH (ref 4.6–6.5)

## 2020-03-06 NOTE — Progress Notes (Unsigned)
Subjective:    Patient ID: Rebecca Elliott, female    DOB: 10/27/1926, 87 y.o.   MRN: [id number]  DOS:  03/06/2020 Type of visit - description: Follow-up, here with her daughter.  Knee pain, worse on the right, continues to be an issue. She has ankle swelling and some pain, mostly at the end of the day. Ambulatory BPs reviewed. COPD: She is confused about the multiple inhalers she has at home. She did had 4 mechanical falls over the last year  Review of Systems Denies chest pain or difficulty breathing No nausea, vomiting, diarrhea.   Past Medical History:  Diagnosis Date  . Allergic rhinitis   . COPD  and ASTHMA   . Depression   . Diabetes mellitus    as an adult  . DJD (degenerative joint disease)   . GERD (gastroesophageal reflux disease)    and HH w/ reflux  . History of colonic polyps   . Hyperlipidemia   . Hypertension     Past Surgical History:  Procedure Laterality Date  . ABDOMINAL HYSTERECTOMY     apparently no oophorectomy  . APPENDECTOMY    . bilateral breast tumor    . BREAST EXCISIONAL BIOPSY Bilateral    one in her 20's and one in her 50's, both benign  . CATARACT EXTRACTION Bilateral    [place]  . CATARACT EXTRACTION, BILATERAL    . CHOLECYSTECTOMY    . TONSILLECTOMY    . TUBAL LIGATION      Allergies as of 03/06/2020   No Known Allergies     Medication List       Accurate as of March 06, 2020 11:59 PM. If you have any questions, ask your nurse or doctor.        STOP taking these medications   Dulera 100-5 MCG/ACT Aero Generic drug: mometasone-formoterol Stopped by: Kathlene November, MD     TAKE these medications   acetaminophen 500 MG tablet Commonly known as: TYLENOL Take 500 mg by mouth as needed.   albuterol 108 (90 Base) MCG/ACT inhaler Commonly known as: VENTOLIN HFA Inhale 2 puffs into the lungs every 6 (six) hours as needed for wheezing or shortness of breath.   amLODipine 10 MG tablet Commonly known as: NORVASC Take 1  tablet (10 mg total) by mouth daily.   arformoterol 15 MCG/2ML Nebu Commonly known as: BROVANA Take 2 mLs (15 mcg total) by nebulization in the morning and at bedtime.   aspirin 81 MG tablet Take 81 mg by mouth daily.   atorvastatin 10 MG tablet Commonly known as: LIPITOR Take 1 tablet (10 mg total) by mouth daily.   BENEFIBER DRINK MIX PO Take by mouth 2 (two) times daily.   budesonide 0.25 MG/2ML nebulizer solution Commonly known as: Pulmicort Take 2 mLs (0.25 mg total) by nebulization in the morning and at bedtime.   Centrum Silver Chew Chew by mouth.   dextromethorphan-guaiFENesin 30-600 MG 12hr tablet Commonly known as: MUCINEX DM Take 1 tablet by mouth daily.   diclofenac sodium 1 % Gel Commonly known as: VOLTAREN Apply 2 g topically 4 (four) times daily.   docusate sodium 100 MG capsule Commonly known as: COLACE Take 1-2 by mouth once a day.   famotidine 20 MG tablet Commonly known as: PEPCID Take 1 tablet (20 mg total) by mouth 2 (two) times daily.   Incruse Ellipta 62.5 MCG/INH Aepb Generic drug: umeclidinium bromide Inhale 1 puff into the lungs daily.   ipratropium-albuterol 0.5-2.5 (3) MG/3ML Soln  Commonly known as: DUONEB Take 3 mLs by nebulization every 6 (six) hours as needed.   Lidocaine 3 % Crea Apply 1 application topically 3 (three) times daily as needed.   loratadine 10 MG tablet Commonly known as: CLARITIN Take 10 mg by mouth daily.   losartan-hydrochlorothiazide 100-12.5 MG tablet Commonly known as: HYZAAR Take 1 tablet by mouth daily.   metoprolol succinate 25 MG 24 hr tablet Commonly known as: TOPROL-XL Take 1.5 tablets (37.5 mg total) by mouth daily.   NONFORMULARY OR COMPOUNDED ITEM Compression Stockings Dx: M79.89   pantoprazole 40 MG tablet Commonly known as: PROTONIX Take 1 tablet (40 mg total) by mouth every other day.   sitaGLIPtin 50 MG tablet Commonly known as: Januvia Take 1 tablet (50 mg total) by mouth daily.           Objective:   Physical Exam BP (!) 154/76 (BP Location: Left Arm, Patient Position: Sitting, Cuff Size: Large)   Pulse 67   Temp 97.7 F (36.5 C) (Oral)   Ht 5\' 6"  (1.676 m)   Wt 211 lb 12.8 oz (96.1 kg)   SpO2 97%   BMI 34.19 kg/m  General:   Well developed, NAD, BMI noted. HEENT:  Normocephalic . Face symmetric, atraumatic Lungs:  Creased breath sounds Normal respiratory effort, no intercostal retractions, no accessory muscle use. Heart: RRR,  no murmur.  Lower extremities: +/+++ Pretibial and periankle edema Skin: Not pale. Not jaundice Neurologic:  alert & oriented X3.  Speech normal, gait assisted by a rolling walker Psych--  Cognition and judgment appear intact.  Cooperative with normal attention span and concentration.  Behavior appropriate. No anxious or depressed appearing.      Assessment      ASSESSMENT DM--no neuropathy, Metformin DC 04-2015 d/t creatinine HTN  Hyperlipidemia Depression COPD - Asthma Pulmonary nodules per CXR 03-2014, CT 03-2016, stable, no further CTs needed Anemia, chronic, on-off iron def  Colonoscopy 2004: No polyps EGD 2006 for dysphagia showed hiatal hernia, occult  stricture? Colonoscopy 2010 showed diverticuli Dysphagia: Barium swallow showed dysmotility 08-2014 DJD GERD OSA DX 05/2019  Macular degeneration currently L  Branch retinal vein occlusion  PLAN: DM: On Januvia, checking labs HTN: Ambulatory BPs on average 140/70.  She is 87 y/o , recommend no aggressive control, continue amlodipine, Hyzaar, metoprolol.  Check a CMP, TSH. High cholesterol: Atorvastatin, check FLP. DJD: Mostly at the knees, currently on Tylenol, Biofreeze, already saw Ortho, surgery?  Not a option for her. We talk about painkillers benefits and risks, declined. Plan: Continue with above, icing, PT referral. Gait disorder: Frequent falls, PT referral.  Parking permit signed. Edema: On amlodipine but likely dependent, recommend leg elevation  and increase activity COPD and asthma: Has dozens of inhalers at home, recommend to stop automatic refills.  Plan: -Pulmicort twice daily and Brovana twice daily -Continue Incruse Ellipta -Rescue inhalers: DuoNeb and albuterol. Preventive care: Recommend healthcare power of attorney RTC 3 to 4 months  Time is spent with the patient and her daughter today 45 minutes, she brought a bag w/ multiple inhalers, we went through them, I explained what each one was.  We carefully discussed the plan going forward were as far as COPD and asthma control.  See AVS. This visit occurred during the SARS-CoV-2 public health emergency.  Safety protocols were in place, including screening questions prior to the visit, additional usage of staff PPE, and extensive cleaning of exam room while observing appropriate contact time as indicated for disinfecting solutions.

## 2020-03-06 NOTE — Patient Instructions (Addendum)
Respiratory medicines: Incruse Ellipta inhalation once a day Pulmicort nebulizer twice daily Brovana nebulizer twice a day Rescue inhaler: Only if cough or wheezing DuoNeb nebulization  OR Albuterol inhalation on any of the brands you have   Check the  blood pressure regularly, goal to be on average 140/80s     GO TO THE LAB : Get the blood work     [place], [place] back for for a checkup in 3 months     Advance Directive  Advance directives are legal documents that allow you to make decisions about your health care and medical treatment in case you become unable to communicate for yourself. Advance directives let your wishes be known to family, friends, and health care providers. Discussing and writing advance directives should happen over time rather than all at once. Advance directives can be changed and updated at any time. There are different types of advance directives, such as:  Medical power of attorney.  Living will.  Do not resuscitate (DNR) order or do not attempt resuscitation (DNAR) order. Health care proxy and medical power of attorney A health care proxy is also called a health care agent. This person is appointed to make medical decisions for you when you are unable to make decisions for yourself. Generally, people ask a trusted friend or family member to act as their proxy and represent their preferences. Make sure you have an agreement with your trusted person to act as your proxy. A proxy may have to make a medical decision on your behalf if your wishes are not known. A medical power of attorney, also called a durable power of attorney for health care, is a legal document that names your health care proxy. Depending on the laws in your state, the document may need to be:  Signed.  Notarized.  Dated.  Copied.  Witnessed.  Incorporated into your medical record. You may also want to appoint a trusted person to  manage your money in the event you are unable to do so. This is called a durable power of attorney for finances. It is a separate legal document from the durable power of attorney for health care. You may choose your health care proxy or someone different to act as your agent in money matters. If you do not appoint a proxy, or there is a concern that the proxy is not acting in your best interest, a court may appoint a guardian to act on your behalf. Living will A living will is a set of instructions that state your wishes about medical care when you cannot express them yourself. Health care providers should keep a copy of your living will in your medical record. You may want to give a copy to family members or friends. To alert caregivers in case of an emergency, you can place a card in your wallet to let them know that you have a living will and where they can find it. A living will is used if you become:  Terminally ill.  Disabled.  Unable to communicate or make decisions. The following decisions should be included in your living will:  To use or not to use life support equipment, such as dialysis machines and breathing machines (ventilators).  Whether you want a DNR or DNAR order. This tells health care providers not to use cardiopulmonary resuscitation (CPR) if breathing or heartbeat stops.  To use or not to use tube feeding.  To be given or not  to be given food and fluids.  Whether you want comfort (palliative) care when the goal becomes comfort rather than a cure.  Whether you want to donate your organs and tissues. A living will does not give instructions for distributing your money and property if you should pass away. DNR or DNAR A DNR or DNAR order is a request not to have CPR in the event that your heart stops beating or you stop breathing. If a DNR or DNAR order has not been made and shared, a health care provider will try to help any patient whose heart has stopped or who has  stopped breathing. If you plan to have surgery, talk with your health care provider about how your DNR or DNAR order will be followed if problems occur. What if I do not have an advance directive? Some states assign family decision makers to act on your behalf if you do not have an advance directive. Each state has its own laws about advance directives. You may want to check with your health care provider, attorney, or state representative about the laws in your state. Summary  Advance directives are legal documents that allow you to make decisions about your health care and medical treatment in case you become unable to communicate for yourself.  The process of discussing and writing advance directives should happen over time. You can change and update advance directives at any time.  Advance directives may include a medical power of attorney, a living will, and a DNR or DNAR order. This information is not intended to replace advice given to you by your health care provider. Make sure you discuss any questions you have with your health care provider. Document Revised: 10/09/2019 Document Reviewed: 10/09/2019 Elsevier Patient Education  2021 [employer].

## 2020-03-07 NOTE — Assessment & Plan Note (Signed)
DM: On Januvia, checking labs HTN: Ambulatory BPs on average 140/70.  She is 87 y/o , recommend no aggressive control, continue amlodipine, Hyzaar, metoprolol.  Check a CMP, TSH. High cholesterol: Atorvastatin, check FLP. DJD: Mostly at the knees, currently on Tylenol, Biofreeze, already saw Ortho, surgery?  Not a option for her. We talk about painkillers benefits and risks, declined. Plan: Continue with above, icing, PT referral. Gait disorder: Frequent falls, PT referral.  Parking permit signed. Edema: On amlodipine but likely dependent, recommend leg elevation and increase activity COPD and asthma: Has dozens of inhalers at home, recommend to stop automatic refills.  Plan: -Pulmicort twice daily and Brovana twice daily -Continue Incruse Ellipta -Rescue inhalers: DuoNeb and albuterol. Preventive care: Recommend healthcare power of attorney RTC 3 to 4 months

## 2020-03-13 MED ORDER — PIOGLITAZONE HCL 30 MG PO TABS: 30.0000 mg | ORAL_TABLET | Freq: Every day | ORAL | 3 refills | Status: DC

## 2020-03-13 NOTE — Addendum Note (Signed)
Addended byDamita Dunnings D on: 03/13/2020 08:05 AM   Modules accepted: Orders

## 2020-03-15 DIAGNOSIS — J449 Chronic obstructive pulmonary disease, unspecified: Secondary | ICD-10-CM | POA: Diagnosis not present

## 2020-03-15 DIAGNOSIS — I10 Essential (primary) hypertension: Secondary | ICD-10-CM | POA: Diagnosis not present

## 2020-03-15 DIAGNOSIS — G4733 Obstructive sleep apnea (adult) (pediatric): Secondary | ICD-10-CM | POA: Diagnosis not present

## 2020-03-15 DIAGNOSIS — J309 Allergic rhinitis, unspecified: Secondary | ICD-10-CM | POA: Diagnosis not present

## 2020-03-15 DIAGNOSIS — F32A Depression, unspecified: Secondary | ICD-10-CM | POA: Diagnosis not present

## 2020-03-15 DIAGNOSIS — H3530 Unspecified macular degeneration: Secondary | ICD-10-CM | POA: Diagnosis not present

## 2020-03-15 DIAGNOSIS — K219 Gastro-esophageal reflux disease without esophagitis: Secondary | ICD-10-CM | POA: Diagnosis not present

## 2020-03-15 DIAGNOSIS — M170 Bilateral primary osteoarthritis of knee: Secondary | ICD-10-CM | POA: Diagnosis not present

## 2020-03-15 DIAGNOSIS — E119 Type 2 diabetes mellitus without complications: Secondary | ICD-10-CM | POA: Diagnosis not present

## 2020-03-15 DIAGNOSIS — E785 Hyperlipidemia, unspecified: Secondary | ICD-10-CM | POA: Diagnosis not present

## 2020-03-17 DIAGNOSIS — E119 Type 2 diabetes mellitus without complications: Secondary | ICD-10-CM | POA: Diagnosis not present

## 2020-03-17 DIAGNOSIS — F32A Depression, unspecified: Secondary | ICD-10-CM | POA: Diagnosis not present

## 2020-03-17 DIAGNOSIS — J449 Chronic obstructive pulmonary disease, unspecified: Secondary | ICD-10-CM | POA: Diagnosis not present

## 2020-03-17 DIAGNOSIS — M170 Bilateral primary osteoarthritis of knee: Secondary | ICD-10-CM | POA: Diagnosis not present

## 2020-03-17 DIAGNOSIS — I10 Essential (primary) hypertension: Secondary | ICD-10-CM | POA: Diagnosis not present

## 2020-03-17 DIAGNOSIS — J309 Allergic rhinitis, unspecified: Secondary | ICD-10-CM | POA: Diagnosis not present

## 2020-03-19 DIAGNOSIS — M170 Bilateral primary osteoarthritis of knee: Secondary | ICD-10-CM | POA: Diagnosis not present

## 2020-03-19 DIAGNOSIS — J449 Chronic obstructive pulmonary disease, unspecified: Secondary | ICD-10-CM | POA: Diagnosis not present

## 2020-03-19 DIAGNOSIS — I10 Essential (primary) hypertension: Secondary | ICD-10-CM | POA: Diagnosis not present

## 2020-03-19 DIAGNOSIS — J309 Allergic rhinitis, unspecified: Secondary | ICD-10-CM | POA: Diagnosis not present

## 2020-03-19 DIAGNOSIS — F32A Depression, unspecified: Secondary | ICD-10-CM | POA: Diagnosis not present

## 2020-03-19 DIAGNOSIS — E119 Type 2 diabetes mellitus without complications: Secondary | ICD-10-CM | POA: Diagnosis not present

## 2020-03-21 DIAGNOSIS — I10 Essential (primary) hypertension: Secondary | ICD-10-CM | POA: Diagnosis not present

## 2020-03-21 DIAGNOSIS — R0602 Shortness of breath: Secondary | ICD-10-CM | POA: Diagnosis not present

## 2020-03-21 DIAGNOSIS — R0902 Hypoxemia: Secondary | ICD-10-CM | POA: Diagnosis not present

## 2020-03-27 ENCOUNTER — Ambulatory Visit (INDEPENDENT_AMBULATORY_CARE_PROVIDER_SITE_OTHER): Payer: Medicare Other | Admitting: Ophthalmology

## 2020-03-27 ENCOUNTER — Encounter (INDEPENDENT_AMBULATORY_CARE_PROVIDER_SITE_OTHER): Payer: Self-pay | Admitting: Ophthalmology

## 2020-03-27 ENCOUNTER — Other Ambulatory Visit: Payer: Self-pay

## 2020-03-27 ENCOUNTER — Telehealth: Payer: Self-pay | Admitting: Internal Medicine

## 2020-03-27 DIAGNOSIS — H34832 Tributary (branch) retinal vein occlusion, left eye, with macular edema: Secondary | ICD-10-CM

## 2020-03-27 DIAGNOSIS — E119 Type 2 diabetes mellitus without complications: Secondary | ICD-10-CM

## 2020-03-27 DIAGNOSIS — H353131 Nonexudative age-related macular degeneration, bilateral, early dry stage: Secondary | ICD-10-CM

## 2020-03-27 MED ORDER — BEVACIZUMAB 2.5 MG/0.1ML IZ SOSY
2.5000 mg | PREFILLED_SYRINGE | INTRAVITREAL | Status: AC | PRN
  Administered 2020-03-27: 2.5 mg via INTRAVITREAL

## 2020-03-27 MED ORDER — EMPAGLIFLOZIN 10 MG PO TABS: 10.0000 mg | ORAL_TABLET | Freq: Every day | ORAL | 1 refills | Status: DC

## 2020-03-27 NOTE — Telephone Encounter (Signed)
Per your request patient's daughter calling to report patient is experiencing swelling bilateral from knee down.

## 2020-03-27 NOTE — Addendum Note (Signed)
Addended byDamita Dunnings D on: 03/27/2020 02:45 PM   Modules accepted: Orders

## 2020-03-27 NOTE — Telephone Encounter (Signed)
Spoke w/ Pt's [name], informed of recommendations. Actos d/c, Jardiance 10mg  sent to pharmacy. Lab appt scheduled for 04/28/20.

## 2020-03-27 NOTE — Progress Notes (Signed)
03/27/2020     CHIEF COMPLAINT Patient presents for Retina Follow Up (6 Wk F/U OU, poss Avastin OS//Pt denies noticeable changes to [place] OU since last visit. Pt denies ocular pain, flashes of light, or floaters OU. //)   HISTORY OF PRESENT ILLNESS: Rebecca Elliott is a 87 y.o. female who presents to the clinic today for:   HPI    Retina Follow Up    Patient presents with  CRVO/BRVO.  In left eye.  This started 6 weeks ago.  Severity is moderate.  Duration of 6 weeks.  Since onset it is stable. Additional comments: 6 Wk F/U OU, poss Avastin OS  Pt denies noticeable changes to [place] OU since last visit. Pt denies ocular pain, flashes of light, or floaters OU.          Last edited by Rockie Neighbours, [place] on 03/27/2020  2:24 PM. (History)      Referring physician: Colon Branch, MD [address] [place],  [place] [address]  HISTORICAL INFORMATION:   Selected notes from the MEDICAL RECORD NUMBER    Lab Results  Component Value Date   HGBA1C 8.4 (H) 03/06/2020     CURRENT MEDICATIONS: No current outpatient medications on file. (Ophthalmic Drugs)   No current facility-administered medications for this visit. (Ophthalmic Drugs)   Current Outpatient Medications (Other)  Medication Sig  . acetaminophen (TYLENOL) 500 MG tablet Take 500 mg by mouth as needed.  Marland Kitchen albuterol (VENTOLIN HFA) 108 (90 Base) MCG/ACT inhaler Inhale 2 puffs into the lungs every 6 (six) hours as needed for wheezing or shortness of breath.  Marland Kitchen amLODipine (NORVASC) 10 MG tablet Take 1 tablet (10 mg total) by mouth daily.  Marland Kitchen arformoterol (BROVANA) 15 MCG/2ML NEBU Take 2 mLs (15 mcg total) by nebulization in the morning and at bedtime.  Marland Kitchen aspirin 81 MG tablet Take 81 mg by mouth daily.  Marland Kitchen atorvastatin (LIPITOR) 10 MG tablet Take 1 tablet (10 mg total) by mouth daily.  . budesonide (PULMICORT) 0.25 MG/2ML nebulizer solution Take 2 mLs (0.25 mg total) by nebulization in the morning and at bedtime.  Marland Kitchen  dextromethorphan-guaiFENesin (MUCINEX DM) 30-600 MG per 12 hr tablet Take 1 tablet by mouth daily.  . diclofenac sodium (VOLTAREN) 1 % GEL Apply 2 g topically 4 (four) times daily.  Marland Kitchen docusate sodium (COLACE) 100 MG capsule Take 1-2 by mouth once a day.  . empagliflozin (JARDIANCE) 10 MG TABS tablet Take 1 tablet (10 mg total) by mouth daily before breakfast.  . famotidine (PEPCID) 20 MG tablet Take 1 tablet (20 mg total) by mouth 2 (two) times daily.  Marland Kitchen ipratropium-albuterol (DUONEB) 0.5-2.5 (3) MG/3ML SOLN Take 3 mLs by nebulization every 6 (six) hours as needed.  . Lidocaine 3 % CREA Apply 1 application topically 3 (three) times daily as needed.  . loratadine (CLARITIN) 10 MG tablet Take 10 mg by mouth daily.  Marland Kitchen losartan-hydrochlorothiazide (HYZAAR) 100-12.5 MG tablet Take 1 tablet by mouth daily.  . metoprolol succinate (TOPROL-XL) 25 MG 24 hr tablet Take 1.5 tablets (37.5 mg total) by mouth daily.  . Multiple Vitamins-Minerals (CENTRUM SILVER) CHEW Chew by mouth.  . NONFORMULARY OR COMPOUNDED ITEM Compression Stockings Dx: M79.89  . pantoprazole (PROTONIX) 40 MG tablet Take 1 tablet (40 mg total) by mouth every other day.  . sitaGLIPtin (JANUVIA) 50 MG tablet Take 1 tablet (50 mg total) by mouth daily.  Marland Kitchen umeclidinium bromide (INCRUSE ELLIPTA) 62.5 MCG/INH AEPB Inhale 1  puff into the lungs daily.  . Wheat Dextrin (BENEFIBER DRINK MIX PO) Take by mouth 2 (two) times daily.   No current facility-administered medications for this visit. (Other)      REVIEW OF SYSTEMS:    ALLERGIES No Known Allergies  PAST MEDICAL HISTORY Past Medical History:  Diagnosis Date  . Allergic rhinitis   . COPD  and ASTHMA   . Depression   . Diabetes mellitus    as an adult  . DJD (degenerative joint disease)   . GERD (gastroesophageal reflux disease)    and HH w/ reflux  . History of colonic polyps   . Hyperlipidemia   . Hypertension    Past Surgical History:  Procedure Laterality Date  .  ABDOMINAL HYSTERECTOMY     apparently no oophorectomy  . APPENDECTOMY    . bilateral breast tumor    . BREAST EXCISIONAL BIOPSY Bilateral    one in her 20's and one in her 50's, both benign  . CATARACT EXTRACTION Bilateral    [place]  . CATARACT EXTRACTION, BILATERAL    . CHOLECYSTECTOMY    . TONSILLECTOMY    . TUBAL LIGATION      FAMILY HISTORY Family History  Problem Relation Age of Onset  . Breast cancer Sister        x 2  . Diabetes Father   . Diabetes Sister   . Heart attack Brother        early 47s  . Heart attack Son   . Pancreatic cancer Son   . Stomach cancer Sister   . Colon cancer Neg Hx     SOCIAL HISTORY Social History   Tobacco Use  . Smoking status: Former Smoker    Packs/day: 1.00    Years: 35.00    Pack years: 35.00    Types: Cigarettes    Quit date: 01/18/1993    Years since quitting: 27.2  . Smokeless tobacco: Never Used  Substance Use Topics  . Alcohol use: No    Alcohol/week: 0.0 standard drinks  . Drug use: No         OPHTHALMIC EXAM: Base Eye Exam    Visual Acuity (ETDRS)      Right Left   Dist cc 20/40 -2 20/200   Dist ph cc 20/30 -1 20/80 -1   Correction: Glasses       Tonometry (Tonopen, 2:25 PM)      Right Left   Pressure 14 16       Pupils      Pupils Dark Light Shape React APD   Right PERRL 4 4 Round Minimal None   Left PERRL 4 4 Round Minimal None       Visual Fields (Counting fingers)      Left Right    Full Full       Extraocular Movement      Right Left    Full Full       Neuro/Psych    Oriented x3: Yes   Mood/Affect: Normal       Dilation    Both eyes: 1.0% Mydriacyl, 2.5% Phenylephrine @ 2:29 PM        Slit Lamp and Fundus Exam    External Exam      Right Left   External Normal Normal       Slit Lamp Exam      Right Left   Lids/Lashes Normal Normal   Conjunctiva/Sclera White and quiet White and quiet   Cornea Clear  Clear   Anterior Chamber Deep and quiet Deep and quiet   Iris Round and  reactive Round and reactive   Lens Posterior chamber intraocular lens Posterior chamber intraocular lens, Centered posterior chamber intraocular lens   Anterior Vitreous Normal Normal       Fundus Exam      Right Left   Posterior Vitreous  Posterior vitreous detachment   Disc  Normal   C/D Ratio  0.1   Macula  Hard drusen, Intraretinal hemorrhage, Macular thickening, Retinal pigment epithelial mottling, Early age related macular degeneration, Microaneurysms, no cystoid macular edema   Vessels  AV nicking, Tortuous, and 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 Scan locations included subfoveal. Central Foveal Thickness: 277. Progression has been stable. Findings include abnormal foveal contour, retinal drusen , no IRF, no SRF.   Left Eye Quality was good. Scan locations included subfoveal. Central Foveal Thickness: 392. Progression has improved. Findings include no SRF, retinal drusen , cystoid macular edema.   Notes New increase in CME in the foveal region left eye       Intravitreal Injection, Pharmacologic Agent - OS - Left Eye       Time Out 03/27/2020. 3:05 PM. Confirmed correct patient, procedure, site, and patient consented.   Anesthesia Topical anesthesia was used. Anesthetic medications included [place] 3.5%.   Procedure Preparation included Ofloxacin , 10% betadine to eyelids, 5% betadine to ocular surface, Tobramycin 0.3%. A 30 gauge needle was used.   Injection:  2.5 mg Bevacizumab (AVASTIN) 2.5mg /0.21mL SOSY   NDC: 64158-309-40, Lot: [id number]   Route: Intravitreal, Site: Left Eye  Post-op Post injection exam found visual acuity of at least counting fingers. The patient tolerated the procedure well. There were no complications. The  patient received written and verbal post procedure care education. Post injection medications were not given.                 ASSESSMENT/PLAN:  Branch retinal vein occlusion with macular edema of left eye Significant CME has recurred today in the left eye.  Will repeat injection today at 6-week follow-up interval and reevaluate macular perfusion status next visit  Early stage nonexudative age-related macular degeneration of both eyes Appears stable      ICD-10-CM   1. Branch retinal vein occlusion with macular edema of left eye  H34.8320 OCT, Retina - OU - Both Eyes    Intravitreal Injection, Pharmacologic Agent - OS - Left Eye    bevacizumab (AVASTIN) SOSY 2.5 mg  2. Early stage nonexudative age-related macular degeneration of both eyes  H35.3131     1.  CME has recurred OS, from BRVO at 6-week interval.  Repeat injection today and return in shorter interval 5 weeks we will reevaluate with fluorescein angiography  2.  3.  Ophthalmic Meds Ordered this visit:  Meds ordered this encounter  Medications  . bevacizumab (AVASTIN) SOSY 2.5 mg       Return in about 5 weeks (around 05/01/2020) for DILATE OU, OPTOS FFA L/R, COLOR FP, AVASTIN OCT, OS.  There are no Patient Instructions on file for this visit.   Explained the diagnoses, plan, and follow up with the patient and they expressed understanding.  Patient  expressed understanding of the importance of proper follow up care.   Clent Demark [place] Rosamond M.D. Diseases & Surgery of the Retina and Vitreous [facility] [place] 03/27/20     Abbreviations: M myopia (nearsighted); A astigmatism; H hyperopia (farsighted); P presbyopia; Mrx spectacle prescription;  CTL contact lenses; OD right eye; OS left eye; OU both eyes  XT exotropia; ET esotropia; PEK punctate epithelial keratitis; PEE punctate epithelial erosions; DES dry eye syndrome; MGD meibomian gland dysfunction; ATs artificial tears; PFAT's preservative free  artificial tears; [place] nuclear sclerotic cataract; PSC posterior subcapsular cataract; ERM epi-retinal membrane; PVD posterior vitreous detachment; RD retinal detachment; DM diabetes mellitus; DR diabetic retinopathy; NPDR non-proliferative diabetic retinopathy; PDR proliferative diabetic retinopathy; CSME clinically significant macular edema; DME diabetic macular edema; dbh dot blot hemorrhages; CWS cotton wool spot; POAG primary open angle glaucoma; C/D cup-to-disc ratio; HVF humphrey visual field; GVF goldmann visual field; OCT optical coherence tomography; IOP intraocular pressure; BRVO Branch retinal vein occlusion; CRVO central retinal vein occlusion; CRAO central retinal artery occlusion; BRAO branch retinal artery occlusion; RT retinal tear; SB scleral buckle; PPV pars plana vitrectomy; VH Vitreous hemorrhage; PRP panretinal laser photocoagulation; IVK intravitreal kenalog; VMT vitreomacular traction; MH Macular hole;  NVD neovascularization of the disc; NVE neovascularization elsewhere; AREDS age related eye disease study; ARMD age related macular degeneration; POAG primary open angle glaucoma; EBMD epithelial/anterior basement membrane dystrophy; ACIOL anterior chamber intraocular lens; IOL intraocular lens; PCIOL posterior chamber intraocular lens; Phaco/IOL phacoemulsification with intraocular lens placement; [place] photorefractive keratectomy; LASIK laser assisted in situ keratomileusis; HTN hypertension; DM diabetes mellitus; COPD chronic obstructive pulmonary disease

## 2020-03-27 NOTE — Assessment & Plan Note (Signed)
Significant CME has recurred today in the left eye.  Will repeat injection today at 6-week follow-up interval and reevaluate macular perfusion status next visit

## 2020-03-27 NOTE — Telephone Encounter (Signed)
Advise patient Edema is a side effect of Actos, DC Actos Start Jardiance 10 mg 1 tablet daily, #30, 1 refill. Recommend good hydration while taking this medication. Watch for genital rash Schedule a BMP in 1 month, DX DM If cost is an issue, will try glimepiride.

## 2020-03-27 NOTE — Assessment & Plan Note (Signed)
Appears stable 

## 2020-03-28 MED ORDER — GLIMEPIRIDE 2 MG PO TABS: 2.0000 mg | ORAL_TABLET | Freq: Every day | ORAL | 4 refills | Status: DC

## 2020-03-28 NOTE — Telephone Encounter (Signed)
Patients daughter states Vania Rea is too expensive $51. Daughter is wondering if there is something else

## 2020-03-28 NOTE — Telephone Encounter (Signed)
Okay, DC Jardiance start glimepiride 2 mg 1 tablet daily.  Send 20-month supply. They need to watch closely for low blood sugar, if they are less than 100 let me know.

## 2020-03-28 NOTE — Telephone Encounter (Signed)
Please advise 

## 2020-03-28 NOTE — Addendum Note (Signed)
Addended byDamita Dunnings D on: 03/28/2020 01:19 PM   Modules accepted: Orders

## 2020-03-28 NOTE — Telephone Encounter (Signed)
Rx sent 

## 2020-04-01 DIAGNOSIS — M170 Bilateral primary osteoarthritis of knee: Secondary | ICD-10-CM | POA: Diagnosis not present

## 2020-04-01 DIAGNOSIS — F32A Depression, unspecified: Secondary | ICD-10-CM | POA: Diagnosis not present

## 2020-04-01 DIAGNOSIS — I10 Essential (primary) hypertension: Secondary | ICD-10-CM | POA: Diagnosis not present

## 2020-04-01 DIAGNOSIS — E119 Type 2 diabetes mellitus without complications: Secondary | ICD-10-CM | POA: Diagnosis not present

## 2020-04-01 DIAGNOSIS — J309 Allergic rhinitis, unspecified: Secondary | ICD-10-CM | POA: Diagnosis not present

## 2020-04-01 DIAGNOSIS — J449 Chronic obstructive pulmonary disease, unspecified: Secondary | ICD-10-CM | POA: Diagnosis not present

## 2020-04-02 ENCOUNTER — Telehealth: Payer: Self-pay | Admitting: Internal Medicine

## 2020-04-02 NOTE — Telephone Encounter (Signed)
LMOM for [name]- informed of PCP recommendations.

## 2020-04-02 NOTE — Telephone Encounter (Signed)
Pt's daughter is calling requesting call back in regards to pt is having trouble with her  breathing since Dr. Larose Kells had a try a new medication about 3 wks ago. Also states that physical therapy was there on yesterday and her oxygen levels were low as well. Daughter would like advice on if she should stop the medicine.

## 2020-04-02 NOTE — Telephone Encounter (Signed)
Needs to be seen: If she  has severe shortness of breath: ER. Otherwise arrange an appointment, I have openings this afternoon and tomorrow. Recommend to get a pulse-oximeter at the pharmacy to check her oxygen.

## 2020-04-02 NOTE — Telephone Encounter (Signed)
Appt scheduled 04/03/20 at 9:40am.

## 2020-04-02 NOTE — Telephone Encounter (Signed)
Caller : [place]  Call Back @ [phone]  Per Jalene Mullet, she was patient yesterday evening around 530pm. Patient's blood pressure was 160/80 and oxygen was 88 when moving and laying.  Please advise

## 2020-04-03 ENCOUNTER — Other Ambulatory Visit: Payer: Self-pay

## 2020-04-03 ENCOUNTER — Ambulatory Visit (INDEPENDENT_AMBULATORY_CARE_PROVIDER_SITE_OTHER): Payer: Medicare Other | Admitting: Internal Medicine

## 2020-04-03 VITALS — BP 185/67 | HR 77 | Temp 97.7°F | Wt 217.0 lb

## 2020-04-03 DIAGNOSIS — E119 Type 2 diabetes mellitus without complications: Secondary | ICD-10-CM | POA: Diagnosis not present

## 2020-04-03 DIAGNOSIS — I10 Essential (primary) hypertension: Secondary | ICD-10-CM | POA: Diagnosis not present

## 2020-04-03 DIAGNOSIS — J411 Mucopurulent chronic bronchitis: Secondary | ICD-10-CM | POA: Diagnosis not present

## 2020-04-03 MED ORDER — HYDROCHLOROTHIAZIDE 25 MG PO TABS: 12.5000 mg | ORAL_TABLET | Freq: Every day | ORAL | 1 refills | Status: DC

## 2020-04-03 NOTE — Progress Notes (Signed)
Subjective:    Patient ID: Rebecca Elliott, female    DOB: 22-Sep-1926, 87 y.o.   MRN: [id number]  DOS:  04/03/2020 Type of visit - description: Acute  Since the last office visit, we rearrange her asthma - COPD medications. She seems to be more short of breath than before. Cough has actually decreased but wheezing is somewhat increased.  HTN: Has been elevated lately, in the 150s and at x180s.  She called EMS 1 time, mostly because she was short of breath and got panicky.  EMS arrived, they did not bring her to the emergency room.  Review of Systems Denies fever chills No chest pain After we stop Actos edema decreased   Past Medical History:  Diagnosis Date   Allergic rhinitis    COPD  and ASTHMA    Depression    Diabetes mellitus    as an adult   DJD (degenerative joint disease)    GERD (gastroesophageal reflux disease)    and HH w/ reflux   History of colonic polyps    Hyperlipidemia    Hypertension     Past Surgical History:  Procedure Laterality Date   ABDOMINAL HYSTERECTOMY     apparently no oophorectomy   APPENDECTOMY     bilateral breast tumor     BREAST EXCISIONAL BIOPSY Bilateral    one in her 20's and one in her 28's, both benign   CATARACT EXTRACTION Bilateral    Groat   CATARACT EXTRACTION, BILATERAL     CHOLECYSTECTOMY     TONSILLECTOMY     TUBAL LIGATION      Allergies as of 04/03/2020   No Known Allergies     Medication List       Accurate as of April 03, 2020 11:59 PM. If you have any questions, ask your nurse or doctor.        STOP taking these medications   Incruse Ellipta 62.5 MCG/INH Aepb Generic drug: umeclidinium bromide Stopped by: Kathlene November, MD     TAKE these medications   acetaminophen 500 MG tablet Commonly known as: TYLENOL Take 500 mg by mouth as needed.   albuterol 108 (90 Base) MCG/ACT inhaler Commonly known as: VENTOLIN HFA Inhale 2 puffs into the lungs every 6 (six) hours as needed for wheezing  or shortness of breath.   amLODipine 10 MG tablet Commonly known as: NORVASC Take 1 tablet (10 mg total) by mouth daily.   arformoterol 15 MCG/2ML Nebu Commonly known as: BROVANA Take 2 mLs (15 mcg total) by nebulization in the morning and at bedtime.   aspirin 81 MG tablet Take 81 mg by mouth daily.   atorvastatin 10 MG tablet Commonly known as: LIPITOR Take 1 tablet (10 mg total) by mouth daily.   BENEFIBER DRINK MIX PO Take by mouth 2 (two) times daily.   budesonide 0.25 MG/2ML nebulizer solution Commonly known as: Pulmicort Take 2 mLs (0.25 mg total) by nebulization in the morning and at bedtime.   Centrum Silver Chew Chew by mouth.   dextromethorphan-guaiFENesin 30-600 MG 12hr tablet Commonly known as: MUCINEX DM Take 1 tablet by mouth daily.   diclofenac sodium 1 % Gel Commonly known as: VOLTAREN Apply 2 g topically 4 (four) times daily.   docusate sodium 100 MG capsule Commonly known as: COLACE Take 1-2 by mouth once a day.   famotidine 20 MG tablet Commonly known as: PEPCID Take 1 tablet (20 mg total) by mouth 2 (two) times daily.   glimepiride 2  MG tablet Commonly known as: AMARYL Take 1 tablet (2 mg total) by mouth daily with breakfast.   hydrochlorothiazide 25 MG tablet Commonly known as: HYDRODIURIL Take 0.5 tablets (12.5 mg total) by mouth daily. Started by: Kathlene November, MD   ipratropium-albuterol 0.5-2.5 (3) MG/3ML Soln Commonly known as: DUONEB Take 3 mLs by nebulization every 6 (six) hours as needed.   Lidocaine 3 % Crea Apply 1 application topically 3 (three) times daily as needed.   loratadine 10 MG tablet Commonly known as: CLARITIN Take 10 mg by mouth daily.   losartan-hydrochlorothiazide 100-12.5 MG tablet Commonly known as: HYZAAR Take 1 tablet by mouth daily.   metoprolol succinate 25 MG 24 hr tablet Commonly known as: TOPROL-XL Take 1.5 tablets (37.5 mg total) by mouth daily.   NONFORMULARY OR COMPOUNDED ITEM Compression  Stockings Dx: M79.89   pantoprazole 40 MG tablet Commonly known as: PROTONIX Take 1 tablet (40 mg total) by mouth every other day.   sitaGLIPtin 50 MG tablet Commonly known as: Januvia Take 1 tablet (50 mg total) by mouth daily.          Objective:   Physical Exam BP (!) 185/67 (BP Location: Left Arm, Patient Position: Sitting, Cuff Size: Large)    Pulse 77    Temp 97.7 F (36.5 C) (Oral)    Wt 217 lb (98.4 kg)    SpO2 93%    BMI 35.02 kg/m  General:   Well developed, NAD, BMI noted. HEENT:  Normocephalic . Face symmetric, atraumatic Lungs:  Decreased breath sounds but otherwise clear Normal respiratory effort, no intercostal retractions, no accessory muscle use. Heart: RRR,  no murmur.  Lower extremities: Trace pretibial edema bilaterally  Skin: Not pale. Not jaundice Neurologic:  alert & oriented X3.  Speech normal  Psych--  Cognition and judgment appear intact.  Cooperative with normal attention span and concentration.  Behavior appropriate. No anxious or depressed appearing.      Assessment      ASSESSMENT DM--no neuropathy, Metformin DC 04-2015 d/t creatinine HTN  Hyperlipidemia Depression COPD - Asthma Pulmonary nodules per CXR 03-2014, CT 03-2016, stable, no further CTs needed Anemia, chronic, on-off iron def  Colonoscopy 2004: No polyps EGD 2006 for dysphagia showed hiatal hernia, occult  stricture? Colonoscopy 2010 showed diverticuli Dysphagia: Barium swallow showed dysmotility 08-2014 DJD GERD OSA DX 05/2019  Macular degeneration currently L  Branch retinal vein occlusion  PLAN: COPD asthma: See last visit. Started Incruse Ellipta, breathing is actually slightly worse per patient.  Will stop it. Continue Pulmicort and Brovana twice daily, rescue inhalers with DuoNeb and albuterol. Recommend to get a pulse oximeter,goal >  93-94%.  If it is much lower consistently let me know, call 911 if severe symptoms.   HTN: Lately BPs has been increased.   Will add HCTZ 12.5 mg otherwise continue   Amlodipine 10 mg, Hyzaar 100-12.5 mg , metoprolol.    Reassess on RTC. DM: On Januvia only because since the last visit Actos was DC due to edema.  Edema better. Attempted to Rx Jardiance but cost was an issue, then started glimepiride. No change for now.  Ideally I would ask her to check ambulatory CBGs but she does not have a glucometer.  Reassess on at the next visit. RTC 2 weeks  Time spent with the patient her daughter 30 minutes discussing carefully the plan of care   This visit occurred during the SARS-CoV-2 public health emergency.  Safety protocols were in place, including screening questions prior  to the visit, additional usage of staff PPE, and extensive cleaning of exam room while observing appropriate contact time as indicated for disinfecting solutions.

## 2020-04-03 NOTE — Patient Instructions (Addendum)
For blood pressure: Continue same medications and add Hydrochlorothiazide 25 mg: Half tablet in the morning   For your breathing: Stop Incruse Ellipta Other medications the same Check your oxygen levels they should be around 93 to 94% or more. They may get a little lower with walking or exercise  Check the  blood pressure  daily BP GOAL is between 110/65 and  135/85.   GO TO THE FRONT DESK, PLEASE SCHEDULE YOUR APPOINTMENTS Come back for   a checkup in 2 months

## 2020-04-04 ENCOUNTER — Encounter: Payer: Self-pay | Admitting: Internal Medicine

## 2020-04-04 DIAGNOSIS — F32A Depression, unspecified: Secondary | ICD-10-CM | POA: Diagnosis not present

## 2020-04-04 DIAGNOSIS — I10 Essential (primary) hypertension: Secondary | ICD-10-CM | POA: Diagnosis not present

## 2020-04-04 DIAGNOSIS — E119 Type 2 diabetes mellitus without complications: Secondary | ICD-10-CM | POA: Diagnosis not present

## 2020-04-04 DIAGNOSIS — M170 Bilateral primary osteoarthritis of knee: Secondary | ICD-10-CM | POA: Diagnosis not present

## 2020-04-04 DIAGNOSIS — J449 Chronic obstructive pulmonary disease, unspecified: Secondary | ICD-10-CM | POA: Diagnosis not present

## 2020-04-04 DIAGNOSIS — J309 Allergic rhinitis, unspecified: Secondary | ICD-10-CM | POA: Diagnosis not present

## 2020-04-04 NOTE — Assessment & Plan Note (Signed)
COPD asthma: See last visit. Started Incruse Ellipta, breathing is actually slightly worse per patient.  Will stop it. Continue Pulmicort and Brovana twice daily, rescue inhalers with DuoNeb and albuterol. Recommend to get a pulse oximeter,goal >  93-94%.  If it is much lower consistently let me know, call 911 if severe symptoms.   HTN: Lately BPs has been increased.  Will add HCTZ 12.5 mg otherwise continue   Amlodipine 10 mg, Hyzaar 100-12.5 mg , metoprolol.    Reassess on RTC. DM: On Januvia only because since the last visit Actos was DC due to edema.  Edema better. Attempted to Rx Jardiance but cost was an issue, then started glimepiride. No change for now.  Ideally I would ask her to check ambulatory CBGs but she does not have a glucometer.  Reassess on at the next visit. RTC 2 weeks

## 2020-04-07 DIAGNOSIS — J449 Chronic obstructive pulmonary disease, unspecified: Secondary | ICD-10-CM | POA: Diagnosis not present

## 2020-04-07 DIAGNOSIS — J309 Allergic rhinitis, unspecified: Secondary | ICD-10-CM | POA: Diagnosis not present

## 2020-04-07 DIAGNOSIS — M170 Bilateral primary osteoarthritis of knee: Secondary | ICD-10-CM | POA: Diagnosis not present

## 2020-04-07 DIAGNOSIS — F32A Depression, unspecified: Secondary | ICD-10-CM | POA: Diagnosis not present

## 2020-04-07 DIAGNOSIS — E119 Type 2 diabetes mellitus without complications: Secondary | ICD-10-CM | POA: Diagnosis not present

## 2020-04-07 DIAGNOSIS — I10 Essential (primary) hypertension: Secondary | ICD-10-CM | POA: Diagnosis not present

## 2020-04-08 ENCOUNTER — Telehealth: Payer: Self-pay

## 2020-04-08 NOTE — Telephone Encounter (Signed)
Plan of care signed and faxed back to [facility] at [phone]. Form sent for scanning.  

## 2020-04-10 ENCOUNTER — Telehealth: Payer: Self-pay | Admitting: Internal Medicine

## 2020-04-10 MED ORDER — ARFORMOTEROL TARTRATE 15 MCG/2ML IN NEBU: 15.0000 ug | INHALATION_SOLUTION | Freq: Two times a day (BID) | RESPIRATORY_TRACT | 3 refills | Status: DC

## 2020-04-10 NOTE — Telephone Encounter (Signed)
Rx sent 

## 2020-04-10 NOTE — Telephone Encounter (Signed)
Medication:arformoterol (BROVANA) 15 MCG/2ML NEBU [[id number]]     Has the patient contacted their pharmacy? no (If no, request that the patient contact the pharmacy for the refill.) (If yes, when and what did the pharmacy advise?)    Preferred Pharmacy (with phone number or street name): [facility], [place] Phone:  [phone]  Fax:  [phone]          Agent: Please be advised that RX refills may take up to 3 business days. We ask that you follow-up with your pharmacy.

## 2020-04-11 ENCOUNTER — Telehealth: Payer: Self-pay | Admitting: Internal Medicine

## 2020-04-11 DIAGNOSIS — J449 Chronic obstructive pulmonary disease, unspecified: Secondary | ICD-10-CM | POA: Diagnosis not present

## 2020-04-11 DIAGNOSIS — M170 Bilateral primary osteoarthritis of knee: Secondary | ICD-10-CM | POA: Diagnosis not present

## 2020-04-11 DIAGNOSIS — E119 Type 2 diabetes mellitus without complications: Secondary | ICD-10-CM | POA: Diagnosis not present

## 2020-04-11 DIAGNOSIS — J309 Allergic rhinitis, unspecified: Secondary | ICD-10-CM | POA: Diagnosis not present

## 2020-04-11 DIAGNOSIS — I10 Essential (primary) hypertension: Secondary | ICD-10-CM | POA: Diagnosis not present

## 2020-04-11 DIAGNOSIS — F32A Depression, unspecified: Secondary | ICD-10-CM | POA: Diagnosis not present

## 2020-04-11 NOTE — Telephone Encounter (Signed)
BP has been elevated, recommend to check daily and keep a follow-up at the end of the month as recommended @ her office visit 04/03/2020. At the time we added HCTZ 12.5 mg daily, hopefully she is taking that

## 2020-04-11 NOTE — Telephone Encounter (Signed)
Spoke w/ Leda Gauze- verbal orders and recommendations given.

## 2020-04-11 NOTE — Telephone Encounter (Signed)
Please advise regarding BP.

## 2020-04-11 NOTE — Telephone Encounter (Signed)
Caller: [facility]) Call back # [phone]  Reporting b/p resting 183/78 also need  Verbal order for PT  1 time a week for 2 weeks 2 time a week for 1 week 1 time a week for 2 weeks  Ok to leave voicemail

## 2020-04-14 DIAGNOSIS — E785 Hyperlipidemia, unspecified: Secondary | ICD-10-CM | POA: Diagnosis not present

## 2020-04-14 DIAGNOSIS — F32A Depression, unspecified: Secondary | ICD-10-CM | POA: Diagnosis not present

## 2020-04-14 DIAGNOSIS — I10 Essential (primary) hypertension: Secondary | ICD-10-CM | POA: Diagnosis not present

## 2020-04-14 DIAGNOSIS — M170 Bilateral primary osteoarthritis of knee: Secondary | ICD-10-CM | POA: Diagnosis not present

## 2020-04-14 DIAGNOSIS — J449 Chronic obstructive pulmonary disease, unspecified: Secondary | ICD-10-CM | POA: Diagnosis not present

## 2020-04-14 DIAGNOSIS — E119 Type 2 diabetes mellitus without complications: Secondary | ICD-10-CM | POA: Diagnosis not present

## 2020-04-14 DIAGNOSIS — J309 Allergic rhinitis, unspecified: Secondary | ICD-10-CM | POA: Diagnosis not present

## 2020-04-14 DIAGNOSIS — K219 Gastro-esophageal reflux disease without esophagitis: Secondary | ICD-10-CM | POA: Diagnosis not present

## 2020-04-14 DIAGNOSIS — H3530 Unspecified macular degeneration: Secondary | ICD-10-CM | POA: Diagnosis not present

## 2020-04-14 DIAGNOSIS — G4733 Obstructive sleep apnea (adult) (pediatric): Secondary | ICD-10-CM | POA: Diagnosis not present

## 2020-04-17 ENCOUNTER — Ambulatory Visit (INDEPENDENT_AMBULATORY_CARE_PROVIDER_SITE_OTHER): Payer: Medicare Other | Admitting: Internal Medicine

## 2020-04-17 ENCOUNTER — Other Ambulatory Visit: Payer: Self-pay

## 2020-04-17 VITALS — BP 178/71 | HR 71 | Temp 97.8°F | Ht 66.0 in | Wt 216.8 lb

## 2020-04-17 DIAGNOSIS — I10 Essential (primary) hypertension: Secondary | ICD-10-CM

## 2020-04-17 DIAGNOSIS — J449 Chronic obstructive pulmonary disease, unspecified: Secondary | ICD-10-CM | POA: Diagnosis not present

## 2020-04-17 DIAGNOSIS — E119 Type 2 diabetes mellitus without complications: Secondary | ICD-10-CM | POA: Diagnosis not present

## 2020-04-17 LAB — POCT GLUCOSE (DEVICE FOR HOME USE): POC Glucose: 74 mg/dl (ref 70–99)

## 2020-04-17 MED ORDER — VALSARTAN 160 MG PO TABS: 160.0000 mg | ORAL_TABLET | Freq: Every day | ORAL | 1 refills | Status: DC

## 2020-04-17 NOTE — Assessment & Plan Note (Signed)
DM: On Januvia, glimepiride.  No ambulatory CBGs, today she is fasting, no meds today, CBG 74.  Discussed hypoglycemia sxs, continue same meds. COPD asthma: At the last visit Incruse Ellipta was discontinue due to make her feel worse. Slightly better?  Daughter reports she seems to be wheezing  less.  Uses rescue nebs approximately twice daily. O2 sats at home in the low 90-92%.  Here 94%., Continue Pulmicort and Brovana twice daily, rescue inhalers with DuoNeb and albuterol.  Uses rescue inhalers approximately twice a day, okay to use it more. HTN: Ambulatory BPs continue to be elevated, typically in the 160s, occasionally 170. Continue amlodipine 10 mg daily Continue metoprolol XL 25 mg daily. Increase HCTZ 25 mg from 1/2 to 1 tablet  Change Hyzaar 100/12.5 mg to valsartan 160 mg Labs in 2 weeks RTC 2 months

## 2020-04-17 NOTE — Progress Notes (Signed)
Subjective:    Patient ID: Rebecca Elliott, female    DOB: Jul 06, 1926, 87 y.o.   MRN: [id number]  DOS:  04/17/2020 Type of visit - description:  Follow-up from last visit Today with talk about COPD, hypertension and diabetes. Ambulatory BPs reviewed. Breathing slightly better? Lower extremity edema, still there, somewhat better.    Review of Systems See above   Past Medical History:  Diagnosis Date  . Allergic rhinitis   . COPD  and ASTHMA   . Depression   . Diabetes mellitus    as an adult  . DJD (degenerative joint disease)   . GERD (gastroesophageal reflux disease)    and HH w/ reflux  . History of colonic polyps   . Hyperlipidemia   . Hypertension     Past Surgical History:  Procedure Laterality Date  . ABDOMINAL HYSTERECTOMY     apparently no oophorectomy  . APPENDECTOMY    . bilateral breast tumor    . BREAST EXCISIONAL BIOPSY Bilateral    one in her 20's and one in her 50's, both benign  . CATARACT EXTRACTION Bilateral    [place]  . CATARACT EXTRACTION, BILATERAL    . CHOLECYSTECTOMY    . TONSILLECTOMY    . TUBAL LIGATION      Allergies as of 04/17/2020   No Known Allergies     Medication List       Accurate as of April 17, 2020  1:53 PM. If you have any questions, ask your nurse or doctor.        STOP taking these medications   losartan-hydrochlorothiazide 100-12.5 MG tablet Commonly known as: HYZAAR Stopped by: Kathlene November, MD     TAKE these medications   acetaminophen 500 MG tablet Commonly known as: TYLENOL Take 500 mg by mouth as needed.   albuterol 108 (90 Base) MCG/ACT inhaler Commonly known as: VENTOLIN HFA Inhale 2 puffs into the lungs every 6 (six) hours as needed for wheezing or shortness of breath.   amLODipine 10 MG tablet Commonly known as: NORVASC Take 1 tablet (10 mg total) by mouth daily.   arformoterol 15 MCG/2ML Nebu Commonly known as: BROVANA Take 2 mLs (15 mcg total) by nebulization in the morning and at bedtime.    aspirin 81 MG tablet Take 81 mg by mouth daily.   atorvastatin 10 MG tablet Commonly known as: LIPITOR Take 1 tablet (10 mg total) by mouth daily.   BENEFIBER DRINK MIX PO Take by mouth 2 (two) times daily.   budesonide 0.25 MG/2ML nebulizer solution Commonly known as: Pulmicort Take 2 mLs (0.25 mg total) by nebulization in the morning and at bedtime.   Centrum Silver Chew Chew by mouth.   dextromethorphan-guaiFENesin 30-600 MG 12hr tablet Commonly known as: MUCINEX DM Take 1 tablet by mouth daily.   diclofenac sodium 1 % Gel Commonly known as: VOLTAREN Apply 2 g topically 4 (four) times daily.   docusate sodium 100 MG capsule Commonly known as: COLACE Take 1-2 by mouth once a day.   famotidine 20 MG tablet Commonly known as: PEPCID Take 1 tablet (20 mg total) by mouth 2 (two) times daily.   glimepiride 2 MG tablet Commonly known as: AMARYL Take 1 tablet (2 mg total) by mouth daily with breakfast.   hydrochlorothiazide 25 MG tablet Commonly known as: HYDRODIURIL Take 1 tablet (25 mg total) by mouth daily. What changed: how much to take Changed by: Kathlene November, MD   ipratropium-albuterol 0.5-2.5 (3) MG/3ML Soln Commonly  known as: DUONEB Take 3 mLs by nebulization every 6 (six) hours as needed.   Lidocaine 3 % Crea Apply 1 application topically 3 (three) times daily as needed.   loratadine 10 MG tablet Commonly known as: CLARITIN Take 10 mg by mouth daily.   metoprolol succinate 25 MG 24 hr tablet Commonly known as: TOPROL-XL Take 1.5 tablets (37.5 mg total) by mouth daily.   NONFORMULARY OR COMPOUNDED ITEM Compression Stockings Dx: M79.89   pantoprazole 40 MG tablet Commonly known as: PROTONIX Take 1 tablet (40 mg total) by mouth every other day.   sitaGLIPtin 50 MG tablet Commonly known as: Januvia Take 1 tablet (50 mg total) by mouth daily.   valsartan 160 MG tablet Commonly known as: DIOVAN Take 1 tablet (160 mg total) by mouth daily. Started  by: Kathlene November, MD          Objective:   Physical Exam BP (!) 178/71 (BP Location: Left Arm, Patient Position: Sitting, Cuff Size: Large)   Pulse 71   Temp 97.8 F (36.6 C) (Temporal)   Ht 5\' 6"  (1.676 m)   Wt 216 lb 12.8 oz (98.3 kg)   SpO2 94%   BMI 34.99 kg/m  General:   Well developed, NAD, BMI noted. HEENT:  Normocephalic . Face symmetric, atraumatic Lungs:  Decreased breath sounds were clear Normal respiratory effort, no intercostal retractions, no accessory muscle use. Heart: RRR,  no murmur.  Lower extremities: no pretibial edema bilaterally  Skin: Not pale. Not jaundice Neurologic:  alert & oriented X3.  Speech normal, gait at baseline Psych--  Cognition and judgment appear intact.  Cooperative with normal attention span and concentration.  Behavior appropriate. No anxious or depressed appearing.      Assessment     ASSESSMENT DM--no neuropathy, Metformin DC 04-2015 d/t creatinine HTN  Hyperlipidemia Depression COPD - Asthma Pulmonary nodules per CXR 03-2014, CT 03-2016, stable, no further CTs needed Anemia, chronic, on-off iron def  Colonoscopy 2004: No polyps EGD 2006 for dysphagia showed hiatal hernia, occult  stricture? Colonoscopy 2010 showed diverticuli Dysphagia: Barium swallow showed dysmotility 08-2014 DJD GERD OSA DX 05/2019  Macular degeneration currently L  Branch retinal vein occlusion  PLAN: DM: On Januvia, glimepiride.  No ambulatory CBGs, today she is fasting, no meds today, CBG 74.  Discussed hypoglycemia sxs, continue same meds. COPD asthma: At the last visit Incruse Ellipta was discontinue due to make her feel worse. Slightly better?  Daughter reports she seems to be wheezing  less.  Uses rescue nebs approximately twice daily. O2 sats at home in the low 90-92%.  Here 94%., Continue Pulmicort and Brovana twice daily, rescue inhalers with DuoNeb and albuterol.  Uses rescue inhalers approximately twice a day, okay to use it more. HTN:  Ambulatory BPs continue to be elevated, typically in the 160s, occasionally 170. Continue amlodipine 10 mg daily Continue metoprolol XL 25 mg daily. Increase HCTZ 25 mg from 1/2 to 1 tablet  Change Hyzaar 100/12.5 mg to valsartan 160 mg Labs in 2 weeks RTC 2 months  Time spent 30 minutes, explain the plan of care in great detail to the patient and her daughter.  They verbalized understanding  This visit occurred during the SARS-CoV-2 public health emergency.  Safety protocols were in place, including screening questions prior to the visit, additional usage of staff PPE, and extensive cleaning of exam room while observing appropriate contact time as indicated for disinfecting solutions.

## 2020-04-17 NOTE — Patient Instructions (Addendum)
Continue amlodipine 10 mg daily Continue metoprolol XL 25 mg once  daily. Increase HCTZ 25 mg from 1/2 to 1 tablet  STOP Hyzaar 100/12.5 mg  START  valsartan 160 mg once a day   Check the  blood pressure   BP GOAL is between 110/65 and  135/85. If it is consistently higher or lower, let me know   Continue Pulmicort and Brovana twice daily Rescue medicines: DuoNeb every 6 hours  Albuterol puff every 6 hours       GO TO THE FRONT DESK, PLEASE SCHEDULE YOUR APPOINTMENTS Come back for   labs in 2 weeks  Come back for a checkup in 2 months   Hypoglycemia Hypoglycemia is when the sugar (glucose) level in your blood is too low. Low blood sugar can happen to people who have diabetes and people who do not have diabetes. Low blood sugar can happen quickly, and it can be an emergency. What are the causes? This condition happens most often in people who have diabetes and may be caused by:  Diabetes medicine.  Not eating enough, or not eating often enough.  Doing more physical activity.  Drinking alcohol on an empty stomach. If you do not have diabetes, hypoglycemia may be caused by:  A tumor in the pancreas.  Not eating enough, or not eating for long periods at a time (fasting).  A very bad infection or illness.  Problems after having weight loss (bariatric) surgery.  Kidney failure or liver failure.  Certain medicines. What increases the risk? This condition is more likely to develop in people who:  Have diabetes and take medicines to lower their blood sugar.  Abuse alcohol.  Have a very bad illness. What are the signs or symptoms? Symptoms depend on whether your low blood sugar is mild, moderate, or very low. Mild  Hunger.  Feeling worried or nervous (anxious).  Sweating and feeling clammy.  Feeling dizzy or light-headed.  Being sleepy or having trouble sleeping.  Feeling like you may vomit (nauseous).  A fast heartbeat.  A headache.  Blurry  vision.  Being irritable or grouchy.  Tingling or loss of feeling (numbness) around your mouth, lips, or tongue.  Trouble with moving (coordination). Moderate  Confusion and poor judgment.  Behavior changes.  Weakness.  Uneven heartbeats. Very low Very low blood sugar (severe hypoglycemia) is a medical emergency. It can cause:  Fainting.  Jerky movements that you cannot control (seizure).  Loss of consciousness (coma).  Death. How is this treated? Treating low blood sugar Low blood sugar is often treated by eating or drinking something sugary right away. The snack should contain 15 grams of a fast-acting carb (carbohydrate). Options include:  4 oz (120 mL) of fruit juice.  4-6 oz (120-150 mL) of regular soda (not diet soda).  8 oz (240 mL) of low-fat milk.  Several pieces of hard candy. Check food labels to find out how many to eat for 15 grams.  1 Tbsp (15 mL) of sugar or honey. Treating low blood sugar if you have diabetes If you can think clearly and swallow safely, follow the 15:15 rule:  Take 15 grams of a fast-acting carb. Talk with your doctor about how much you should take.  Always keep a source of fast-acting carb with you, such as: ? Sugar tablets (glucose pills). Take 4 pills. ? Several pieces of hard candy. Check food labels to see how many pieces to eat for 15 grams. ? 4 oz (120 mL) of fruit  juice. ? 4-6 oz (120-150 mL) of regular (not diet) soda. ? 1 Tbsp (15 mL) of honey or sugar.  Check your blood sugar 15 minutes after you take the carb.  If your blood sugar is still at or below 70 mg/dL (3.9 mmol/L), take 15 grams of a carb again.  If your blood sugar does not go above 70 mg/dL (3.9 mmol/L) after 3 tries, get help right away.  After your blood sugar goes back to normal, eat a meal or a snack within 1 hour.   Treating very low blood sugar If your blood sugar is at or below 54 mg/dL (3 mmol/L), you have very low blood sugar, or severe  hypoglycemia. This is an emergency. Get medical help right away. If you have very low blood sugar and you cannot eat or drink, you will need to be given a hormone called glucagon. A family member or friend should learn how to check your blood sugar and how to give you glucagon. Ask your doctor if you need to have an emergency glucagon kit at home. Very low blood sugar may also need to be treated in a hospital. Follow these instructions at home: General instructions  Take over-the-counter and prescription medicines only as told by your doctor.  Stay aware of your blood sugar as told by your doctor.  If you drink alcohol: ? Limit how much you use to:  0-1 drink a day for nonpregnant women.  0-2 drinks a day for men. ? Be aware of how much alcohol is in your drink. In the [place] one drink equals one 12 oz bottle of beer (355 mL), one 5 oz glass of wine (148 mL), or one 1 oz glass of hard liquor (44 mL).  Keep all follow-up visits as told by your doctor. This is important. If you have diabetes:  Always have a rapid-acting carb (15 grams) option with you to treat low blood sugar.  Follow your diabetes care plan as told by your doctor. Make sure you: ? Know the symptoms of low blood sugar. ? Check your blood sugar as often as told by your doctor. Always check it before and after exercise. ? Always check your blood sugar before you drive. ? Take your medicines as told. ? Follow your meal plan. ? Eat on time. Do not skip meals.  Share your diabetes care plan with: ? Your work or school. ? People you live with.  Carry a card or wear jewelry that says you have diabetes.   Contact a doctor if:  You have trouble keeping your blood sugar in your target range.  You have low blood sugar often. Get help right away if:  You still have symptoms after you eat or drink something that contains 15 grams of fast-acting carb and you cannot get your blood sugar above 70 mg/dL by following the 15:15  rule.  Your blood sugar is at or below 54 mg/dL (3 mmol/L).  You have a seizure.  You faint. These symptoms may be an emergency. Do not wait to see if the symptoms will go away. Get medical help right away. Call your local emergency services (911 in the [place] Do not drive yourself to the hospital. Summary  Hypoglycemia happens when the level of sugar (glucose) in your blood is too low.  Low blood sugar can happen to people who have diabetes and people who do not have diabetes. Low blood sugar can happen quickly, and it can be an emergency.  Make  sure you know the symptoms of low blood sugar and know how to treat it.  Always keep a source of sugar (fast-acting carb) with you to treat low blood sugar. This information is not intended to replace advice given to you by your health care provider. Make sure you discuss any questions you have with your health care provider. Document Revised: 11/29/2018 Document Reviewed: 11/29/2018 Elsevier Patient Education  2021 [employer].

## 2020-04-18 ENCOUNTER — Telehealth: Payer: Self-pay

## 2020-04-18 DIAGNOSIS — J309 Allergic rhinitis, unspecified: Secondary | ICD-10-CM

## 2020-04-18 DIAGNOSIS — F32A Depression, unspecified: Secondary | ICD-10-CM

## 2020-04-18 DIAGNOSIS — G4733 Obstructive sleep apnea (adult) (pediatric): Secondary | ICD-10-CM

## 2020-04-18 DIAGNOSIS — E785 Hyperlipidemia, unspecified: Secondary | ICD-10-CM

## 2020-04-18 DIAGNOSIS — H3530 Unspecified macular degeneration: Secondary | ICD-10-CM

## 2020-04-18 DIAGNOSIS — M170 Bilateral primary osteoarthritis of knee: Secondary | ICD-10-CM

## 2020-04-18 DIAGNOSIS — J449 Chronic obstructive pulmonary disease, unspecified: Secondary | ICD-10-CM

## 2020-04-18 DIAGNOSIS — I10 Essential (primary) hypertension: Secondary | ICD-10-CM

## 2020-04-18 DIAGNOSIS — K219 Gastro-esophageal reflux disease without esophagitis: Secondary | ICD-10-CM

## 2020-04-18 DIAGNOSIS — E119 Type 2 diabetes mellitus without complications: Secondary | ICD-10-CM

## 2020-04-18 NOTE — Telephone Encounter (Signed)
Plan of care signed and faxed to [facility] at [phone]. Form sent for scanning.

## 2020-04-28 ENCOUNTER — Other Ambulatory Visit: Payer: Self-pay

## 2020-04-28 ENCOUNTER — Other Ambulatory Visit (INDEPENDENT_AMBULATORY_CARE_PROVIDER_SITE_OTHER): Payer: Medicare Other

## 2020-04-28 DIAGNOSIS — E119 Type 2 diabetes mellitus without complications: Secondary | ICD-10-CM

## 2020-04-28 LAB — BASIC METABOLIC PANEL
BUN: 27 mg/dL — ABNORMAL HIGH (ref 6–23)
CO2: 31 mEq/L (ref 19–32)
Calcium: 9.2 mg/dL (ref 8.4–10.5)
Chloride: 102 mEq/L (ref 96–112)
Creatinine, Ser: 1.53 mg/dL — ABNORMAL HIGH (ref 0.40–1.20)
GFR: 29.42 mL/min — ABNORMAL LOW (ref >60.00)
Glucose, Bld: 71 mg/dL (ref 70–99)
Potassium: 4.1 mEq/L (ref 3.5–5.1)
Sodium: 138 mEq/L (ref 135–145)

## 2020-05-01 ENCOUNTER — Other Ambulatory Visit: Payer: Self-pay

## 2020-05-01 ENCOUNTER — Encounter (INDEPENDENT_AMBULATORY_CARE_PROVIDER_SITE_OTHER): Payer: Self-pay | Admitting: Ophthalmology

## 2020-05-01 ENCOUNTER — Telehealth: Payer: Self-pay | Admitting: Internal Medicine

## 2020-05-01 ENCOUNTER — Ambulatory Visit (INDEPENDENT_AMBULATORY_CARE_PROVIDER_SITE_OTHER): Payer: Medicare Other | Admitting: Ophthalmology

## 2020-05-01 DIAGNOSIS — H34832 Tributary (branch) retinal vein occlusion, left eye, with macular edema: Secondary | ICD-10-CM

## 2020-05-01 DIAGNOSIS — H35371 Puckering of macula, right eye: Secondary | ICD-10-CM | POA: Insufficient documentation

## 2020-05-01 MED ORDER — FLUORESCEIN SODIUM 10 % IV SOLN
500.0000 mg | INTRAVENOUS | Status: AC | PRN
  Administered 2020-05-01: 500 mg via INTRAVENOUS

## 2020-05-01 MED ORDER — BEVACIZUMAB 2.5 MG/0.1ML IZ SOSY
2.5000 mg | PREFILLED_SYRINGE | INTRAVITREAL | Status: AC | PRN
  Administered 2020-05-01: 2.5 mg via INTRAVITREAL

## 2020-05-01 MED ORDER — METOPROLOL SUCCINATE ER 50 MG PO TB24: 50.0000 mg | ORAL_TABLET | Freq: Every day | ORAL | 1 refills | Status: DC

## 2020-05-01 NOTE — Telephone Encounter (Signed)
Labs okay but blood pressure continue elevated after we change her BP meds and seems like lower extremity symptoms are more swelling now.  Plan: Increase metoprolol XL from 25 mg to 50 mg 1 tablet a day.  Send a new prescription. Watch for cough and wheezing. For swelling in the legs, decrease amlodipine 10 mg to half tablet daily.  May need to completely stop it if swelling continue Call with BP readings in 2 weeks

## 2020-05-01 NOTE — Assessment & Plan Note (Signed)
Minor without topographic distortion will observe

## 2020-05-01 NOTE — Telephone Encounter (Signed)
Called pt daughter and advised of changed she verbalized understanding. Updated Rx sent to pharmacy. She will call us back if swelling does not get any better and in 2 weeks with BP readings. -Jma

## 2020-05-01 NOTE — Assessment & Plan Note (Signed)
Improved CME from macular BRVO therapy post Avastin treatment 1 month we will repeat injection today and extend examination to 6 to 7 weeks

## 2020-05-01 NOTE — Progress Notes (Signed)
05/01/2020     CHIEF COMPLAINT Patient presents for Retina Follow Up (5 Wk F/U OU, FP/FFA L/R, poss Avastin OS//Pt denies noticeable changes to [place] OU since last visit. Pt denies ocular pain, flashes of light, or floaters OU. //)   HISTORY OF PRESENT ILLNESS: Rebecca Elliott is a 87 y.o. female who presents to the clinic today for:   HPI    Retina Follow Up    Patient presents with  CRVO/BRVO.  In left eye.  This started 5 weeks ago.  Severity is mild.  Duration of 5 weeks.  Since onset it is stable. Additional comments: 5 Wk F/U OU, FP/FFA L/R, poss Avastin OS  Pt denies noticeable changes to [place] OU since last visit. Pt denies ocular pain, flashes of light, or floaters OU.          Last edited by Rockie Neighbours, [place] on 05/01/2020  3:41 PM. (History)      Referring physician: Colon Branch, MD [address] [place],  [place] [address]  HISTORICAL INFORMATION:   Selected notes from the MEDICAL RECORD NUMBER    Lab Results  Component Value Date   HGBA1C 8.4 (H) 03/06/2020     CURRENT MEDICATIONS: No current outpatient medications on file. (Ophthalmic Drugs)   No current facility-administered medications for this visit. (Ophthalmic Drugs)   Current Outpatient Medications (Other)  Medication Sig  . acetaminophen (TYLENOL) 500 MG tablet Take 500 mg by mouth as needed.  Marland Kitchen albuterol (VENTOLIN HFA) 108 (90 Base) MCG/ACT inhaler Inhale 2 puffs into the lungs every 6 (six) hours as needed for wheezing or shortness of breath.  Marland Kitchen amLODipine (NORVASC) 10 MG tablet Take 1 tablet (10 mg total) by mouth daily.  Marland Kitchen arformoterol (BROVANA) 15 MCG/2ML NEBU Take 2 mLs (15 mcg total) by nebulization in the morning and at bedtime.  Marland Kitchen aspirin 81 MG tablet Take 81 mg by mouth daily.  Marland Kitchen atorvastatin (LIPITOR) 10 MG tablet Take 1 tablet (10 mg total) by mouth daily.  . budesonide (PULMICORT) 0.25 MG/2ML nebulizer solution Take 2 mLs (0.25 mg total) by nebulization in the morning and at  bedtime.  Marland Kitchen dextromethorphan-guaiFENesin (MUCINEX DM) 30-600 MG per 12 hr tablet Take 1 tablet by mouth daily.  . diclofenac sodium (VOLTAREN) 1 % GEL Apply 2 g topically 4 (four) times daily.  Marland Kitchen docusate sodium (COLACE) 100 MG capsule Take 1-2 by mouth once a day.  . famotidine (PEPCID) 20 MG tablet Take 1 tablet (20 mg total) by mouth 2 (two) times daily.  Marland Kitchen glimepiride (AMARYL) 2 MG tablet Take 1 tablet (2 mg total) by mouth daily with breakfast.  . hydrochlorothiazide (HYDRODIURIL) 25 MG tablet Take 1 tablet (25 mg total) by mouth daily.  Marland Kitchen ipratropium-albuterol (DUONEB) 0.5-2.5 (3) MG/3ML SOLN Take 3 mLs by nebulization every 6 (six) hours as needed.  . Lidocaine 3 % CREA Apply 1 application topically 3 (three) times daily as needed.  . loratadine (CLARITIN) 10 MG tablet Take 10 mg by mouth daily.  . metoprolol succinate (TOPROL-XL) 25 MG 24 hr tablet Take 1.5 tablets (37.5 mg total) by mouth daily.  . Multiple Vitamins-Minerals (CENTRUM SILVER) CHEW Chew by mouth.  . NONFORMULARY OR COMPOUNDED ITEM Compression Stockings Dx: M79.89  . pantoprazole (PROTONIX) 40 MG tablet Take 1 tablet (40 mg total) by mouth every other day.  . sitaGLIPtin (JANUVIA) 50 MG tablet Take 1 tablet (50 mg total) by mouth daily.  . valsartan (DIOVAN) 160  MG tablet Take 1 tablet (160 mg total) by mouth daily.  . Wheat Dextrin (BENEFIBER DRINK MIX PO) Take by mouth 2 (two) times daily.   No current facility-administered medications for this visit. (Other)      REVIEW OF SYSTEMS:    ALLERGIES No Known Allergies  PAST MEDICAL HISTORY Past Medical History:  Diagnosis Date  . Allergic rhinitis   . COPD  and ASTHMA   . Depression   . Diabetes mellitus    as an adult  . DJD (degenerative joint disease)   . GERD (gastroesophageal reflux disease)    and HH w/ reflux  . History of colonic polyps   . Hyperlipidemia   . Hypertension    Past Surgical History:  Procedure Laterality Date  . ABDOMINAL  HYSTERECTOMY     apparently no oophorectomy  . APPENDECTOMY    . bilateral breast tumor    . BREAST EXCISIONAL BIOPSY Bilateral    one in her 20's and one in her 50's, both benign  . CATARACT EXTRACTION Bilateral    [place]  . CATARACT EXTRACTION, BILATERAL    . CHOLECYSTECTOMY    . TONSILLECTOMY    . TUBAL LIGATION      FAMILY HISTORY Family History  Problem Relation Age of Onset  . Breast cancer Sister        x 2  . Diabetes Father   . Diabetes Sister   . Heart attack Brother        early 23s  . Heart attack Son   . Pancreatic cancer Son   . Stomach cancer Sister   . Colon cancer Neg Hx     SOCIAL HISTORY Social History   Tobacco Use  . Smoking status: Former Smoker    Packs/day: 1.00    Years: 35.00    Pack years: 35.00    Types: Cigarettes    Quit date: 01/18/1993    Years since quitting: 27.3  . Smokeless tobacco: Never Used  Substance Use Topics  . Alcohol use: No    Alcohol/week: 0.0 standard drinks  . Drug use: No         OPHTHALMIC EXAM:  Base Eye Exam    Visual Acuity (ETDRS)      Right Left   Dist cc 20/30 -2 20/200   Dist ph cc NI 20/80 -1   Correction: Glasses       Tonometry (Tonopen, 3:45 PM)      Right Left   Pressure 14 10       Pupils      Pupils Dark Light Shape React APD   Right PERRL 4 4 Round Minimal None   Left PERRL 4 4 Round Minimal None       Visual Fields (Counting fingers)      Left Right    Full Full       Extraocular Movement      Right Left    Full Full       Neuro/Psych    Oriented x3: Yes   Mood/Affect: Normal       Dilation    Both eyes: 1.0% Mydriacyl, 2.5% Phenylephrine @ 3:45 PM        Slit Lamp and Fundus Exam    External Exam      Right Left   External Normal Normal       Slit Lamp Exam      Right Left   Lids/Lashes Normal Normal   Conjunctiva/Sclera White and quiet White  and quiet   Cornea Clear Clear   Anterior Chamber Deep and quiet Deep and quiet   Iris Round and reactive  Round and reactive   Lens Posterior chamber intraocular lens Posterior chamber intraocular lens, Centered posterior chamber intraocular lens   Anterior Vitreous Normal Normal       Fundus Exam      Right Left   Posterior Vitreous Normal Posterior vitreous detachment   Disc Normal Normal   C/D Ratio 0.1 0.1   Macula Epiretinal membrane minor Hard drusen, Intraretinal hemorrhage, Macular thickening, Retinal pigment epithelial mottling, Early age related macular degeneration, Microaneurysm large, no cystoid macular edema   Vessels Normal AV nicking, Tortuous, and 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 Scan locations included subfoveal. Central Foveal Thickness: 273. Progression has been stable. Findings include abnormal foveal contour, retinal drusen , no IRF, no SRF.   Left Eye Quality was good. Scan locations included subfoveal. Central Foveal Thickness: 293. Progression has improved. Findings include no SRF, retinal drusen .   Notes Much less retinal thickening left eye 1 month post recent Avastin for BRVO with CME,       Color Fundus Photography Optos - OU - Both Eyes       Right Eye Disc findings include normal observations. Macula : drusen. Vessels : normal observations. Periphery : RPE abnormality.   Left Eye Disc findings include normal observations. Macula : edema, geographic atrophy, microaneurysms.   Notes OD with peripheral choroidal folds in a radiating pattern around the nerve in the mid periphery of no pathologic significance.  Peripheral cobblestone degeneration is noted.  OS with macular telangiectasis with parafoveal leakages in the mid to late phases.  There is no choroidal neovascular membrane.  Focal leakages along the  inferotemporal venous arcade are noted however there is no sectoral branch retinal vein occlusion.  There are changes of cobblestone degeneration in the periphery.  Macular BRVO with a prominent large microaneurysm OS       Fluorescein Angiography Optos (Transit OS)       Injection:  500 mg Fluorescein Sodium 10 % injection   NDC: [phone]   Route: Intravenous, Site: Left ArmRight Eye   Mid/Late phase findings include normal observations, window defect.   Notes OD with peripheral choroidal folds in a radiating pattern around the nerve in the mid periphery of no pathologic significance.  Peripheral cobblestone degeneration is noted.  No diabetic retinopathy is noted.  OS with macular telangiectasis with parafoveal leakages in the mid to late phases.  There is no choroidal neovascular membrane.  No diabetic retinopathy is noted.  Focal leakages along the inferotemporal venous arcade are noted however there is no sectoral branch retinal vein occlusion.  There are changes of cobblestone degeneration in the periphery.       Intravitreal Injection, Pharmacologic Agent - OS - Left Eye       Time Out 05/01/2020. 4:28 PM. Confirmed correct patient, procedure, site, and patient consented.   Anesthesia Topical anesthesia was used. Anesthetic medications included [place] 3.5%.   Procedure Preparation included Tobramycin 0.3%. A 30 gauge needle was used.   Injection:  2.5 mg Bevacizumab (AVASTIN) 2.5mg /0.49mL SOSY   NDC: [id number], Lot: [id number]  Route: Intravitreal, Site: Left Eye  Post-op Post injection exam found visual acuity of at least counting fingers. The patient tolerated the procedure well. There were no complications. The patient received written and verbal post procedure care education. Post injection medications were not given.                 ASSESSMENT/PLAN:  Branch retinal vein occlusion with macular edema of left eye Improved CME from macular BRVO  therapy post Avastin treatment 1 month we will repeat injection today and extend examination to 6 to 7 weeks  Macular pucker, right eye Minor without topographic distortion will observe      ICD-10-CM   1. Branch retinal vein occlusion with macular edema of left eye  H34.8320 OCT, Retina - OU - Both Eyes    Color Fundus Photography Optos - OU - Both Eyes    Fluorescein Angiography Optos (Transit OS)    Fluorescein Sodium 10 % injection 500 mg    Intravitreal Injection, Pharmacologic Agent - OS - Left Eye    bevacizumab (AVASTIN) SOSY 2.5 mg  2. Macular pucker, right eye  H35.371     1.  Overall CME OS is improved post Avastin.  Currently at 1 month follow-up.  We will repeat injection today and extend examination interval to 6 to 7 weeks OS next  2.  Mild epiretinal membrane with no visual impact will observe  3.  Ophthalmic Meds Ordered this visit:  Meds ordered this encounter  Medications  . Fluorescein Sodium 10 % injection 500 mg  . bevacizumab (AVASTIN) SOSY 2.5 mg       Return for RV 6 to 7 weeks, dilate, OS, AVASTIN OCT.  There are no Patient Instructions on file for this visit.   Explained the diagnoses, plan, and follow up with the patient and they expressed understanding.  Patient expressed understanding of the importance of proper follow up care.   Clent Demark Amybeth Sieg M.D. Diseases & Surgery of the Retina and Vitreous [facility]ina [facility]abetic [place] 05/01/20     Abbreviations: M myopia (nearsighted); A astigmatism; H hyperopia (farsighted); P presbyopia; Mrx spectacle prescription;  CTL contact lenses; OD right eye; OS left eye; OU both eyes  XT exotropia; ET esotropia; PEK punctate epithelial keratitis; PEE punctate epithelial erosions; DES dry eye syndrome; MGD meibomian gland dysfunction; ATs artificial tears; PFAT's preservative free artificial tears; [place] nuclear sclerotic cataract; PSC posterior subcapsular cataract; ERM epi-retinal membrane; PVD posterior  vitreous detachment; RD retinal detachment; DM diabetes mellitus; DR diabetic retinopathy; NPDR non-proliferative diabetic retinopathy; PDR proliferative diabetic retinopathy; CSME clinically significant macular edema; DME diabetic macular edema; dbh dot blot hemorrhages; CWS cotton wool spot; POAG primary open angle glaucoma; C/D cup-to-disc ratio; HVF humphrey visual field; GVF goldmann visual field; OCT optical coherence tomography; IOP intraocular pressure; BRVO Branch retinal vein occlusion; CRVO central retinal vein occlusion; CRAO central retinal artery occlusion; BRAO branch retinal artery occlusion; RT retinal tear; SB scleral buckle; PPV pars plana vitrectomy; VH Vitreous hemorrhage; PRP panretinal laser photocoagulation; IVK intravitreal kenalog; VMT vitreomacular traction; MH Macular hole;  NVD neovascularization of the disc; NVE neovascularization elsewhere; AREDS age related eye disease study; ARMD age related macular degeneration; POAG primary open angle glaucoma; EBMD epithelial/anterior basement membrane dystrophy; ACIOL anterior chamber intraocular lens; IOL intraocular lens; PCIOL posterior chamber intraocular lens; Phaco/IOL phacoemulsification with intraocular lens placement; [place] photorefractive keratectomy; LASIK laser assisted in situ keratomileusis; HTN hypertension; DM diabetes mellitus; COPD chronic obstructive pulmonary disease

## 2020-05-08 DIAGNOSIS — J309 Allergic rhinitis, unspecified: Secondary | ICD-10-CM | POA: Diagnosis not present

## 2020-05-08 DIAGNOSIS — J449 Chronic obstructive pulmonary disease, unspecified: Secondary | ICD-10-CM | POA: Diagnosis not present

## 2020-05-08 DIAGNOSIS — M170 Bilateral primary osteoarthritis of knee: Secondary | ICD-10-CM | POA: Diagnosis not present

## 2020-05-08 DIAGNOSIS — E119 Type 2 diabetes mellitus without complications: Secondary | ICD-10-CM | POA: Diagnosis not present

## 2020-05-08 DIAGNOSIS — I10 Essential (primary) hypertension: Secondary | ICD-10-CM | POA: Diagnosis not present

## 2020-05-08 DIAGNOSIS — F32A Depression, unspecified: Secondary | ICD-10-CM | POA: Diagnosis not present

## 2020-05-09 ENCOUNTER — Other Ambulatory Visit: Payer: Self-pay | Admitting: Internal Medicine

## 2020-05-09 ENCOUNTER — Telehealth: Payer: Self-pay | Admitting: Internal Medicine

## 2020-05-09 NOTE — Telephone Encounter (Signed)
Caller  Juliann Pulse from [facility] care  Caller # [phone]   Juliann Pulse, states that pt, blood pressure was 164/64 Sitting  147/64 Standing  Pt  will be discharge next week

## 2020-05-09 NOTE — Telephone Encounter (Signed)
FYI

## 2020-05-12 ENCOUNTER — Other Ambulatory Visit: Payer: Self-pay | Admitting: Internal Medicine

## 2020-05-12 NOTE — Telephone Encounter (Signed)
One of the readings was slightly up. Recommend to continue monitoring.

## 2020-05-13 DIAGNOSIS — J309 Allergic rhinitis, unspecified: Secondary | ICD-10-CM | POA: Diagnosis not present

## 2020-05-13 DIAGNOSIS — F32A Depression, unspecified: Secondary | ICD-10-CM | POA: Diagnosis not present

## 2020-05-13 DIAGNOSIS — I10 Essential (primary) hypertension: Secondary | ICD-10-CM | POA: Diagnosis not present

## 2020-05-13 DIAGNOSIS — J449 Chronic obstructive pulmonary disease, unspecified: Secondary | ICD-10-CM | POA: Diagnosis not present

## 2020-05-13 DIAGNOSIS — E119 Type 2 diabetes mellitus without complications: Secondary | ICD-10-CM | POA: Diagnosis not present

## 2020-05-13 DIAGNOSIS — M170 Bilateral primary osteoarthritis of knee: Secondary | ICD-10-CM | POA: Diagnosis not present

## 2020-05-19 ENCOUNTER — Other Ambulatory Visit: Payer: Self-pay

## 2020-05-19 MED ORDER — METOPROLOL SUCCINATE ER 50 MG PO TB24: 50.0000 mg | ORAL_TABLET | Freq: Every day | ORAL | 0 refills | Status: DC

## 2020-05-25 ENCOUNTER — Other Ambulatory Visit: Payer: Self-pay | Admitting: Internal Medicine

## 2020-06-04 ENCOUNTER — Ambulatory Visit: Payer: Medicare Other | Admitting: Internal Medicine

## 2020-06-08 ENCOUNTER — Other Ambulatory Visit: Payer: Self-pay | Admitting: Internal Medicine

## 2020-06-17 ENCOUNTER — Ambulatory Visit (INDEPENDENT_AMBULATORY_CARE_PROVIDER_SITE_OTHER): Payer: Medicare Other | Admitting: Internal Medicine

## 2020-06-17 ENCOUNTER — Encounter: Payer: Self-pay | Admitting: Internal Medicine

## 2020-06-17 ENCOUNTER — Other Ambulatory Visit: Payer: Self-pay

## 2020-06-17 VITALS — BP 160/72 | HR 75 | Temp 97.7°F | Resp 20 | Ht 66.0 in | Wt 213.4 lb

## 2020-06-17 DIAGNOSIS — W19XXXA Unspecified fall, initial encounter: Secondary | ICD-10-CM | POA: Diagnosis not present

## 2020-06-17 DIAGNOSIS — E119 Type 2 diabetes mellitus without complications: Secondary | ICD-10-CM

## 2020-06-17 DIAGNOSIS — I10 Essential (primary) hypertension: Secondary | ICD-10-CM

## 2020-06-17 DIAGNOSIS — L03114 Cellulitis of left upper limb: Secondary | ICD-10-CM

## 2020-06-17 LAB — CBC WITH DIFFERENTIAL/PLATELET
Basophils Absolute: 0.1 10*3/uL (ref 0.0–0.1)
Basophils Relative: 1.1 % (ref 0.0–3.0)
Eosinophils Absolute: 0.1 10*3/uL (ref 0.0–0.7)
Eosinophils Relative: 1.2 % (ref 0.0–5.0)
HCT: 33.1 % — ABNORMAL LOW (ref 36.0–46.0)
Hemoglobin: 10.5 g/dL — ABNORMAL LOW (ref 12.0–15.0)
Lymphocytes Relative: 29.3 % (ref 12.0–46.0)
Lymphs Abs: 2.9 10*3/uL (ref 0.7–4.0)
MCHC: 31.7 g/dL (ref 30.0–36.0)
MCV: 81.2 fl (ref 78.0–100.0)
Monocytes Absolute: 0.8 10*3/uL (ref 0.1–1.0)
Monocytes Relative: 7.7 % (ref 3.0–12.0)
Neutro Abs: 6 10*3/uL (ref 1.4–7.7)
Neutrophils Relative %: 60.7 % (ref 43.0–77.0)
Platelets: 292 10*3/uL (ref 150.0–400.0)
RBC: 4.08 Mil/uL (ref 3.87–5.11)
RDW: 15.2 % (ref 11.5–15.5)
WBC: 10 10*3/uL (ref 4.0–10.5)

## 2020-06-17 LAB — HEMOGLOBIN A1C: Hgb A1c MFr Bld: 6.5 % (ref 4.6–6.5)

## 2020-06-17 MED ORDER — CEPHALEXIN 500 MG PO CAPS: 500.0000 mg | ORAL_CAPSULE | Freq: Four times a day (QID) | ORAL | 0 refills | Status: DC

## 2020-06-17 MED ORDER — METOPROLOL SUCCINATE ER 100 MG PO TB24: 100.0000 mg | ORAL_TABLET | Freq: Every day | ORAL | 0 refills | Status: DC

## 2020-06-17 MED ORDER — METOPROLOL SUCCINATE ER 100 MG PO TB24: 100.0000 mg | ORAL_TABLET | Freq: Every day | ORAL | 1 refills | Status: DC

## 2020-06-17 NOTE — Assessment & Plan Note (Signed)
  DM: Last A1c elevated, I recommended pioglitazone but the family declined because she already has some lower extremity edema. No ambulatory CBGs, the daughter thinks it will be very difficult to check blood sugars.  No clear-cut symptoms of hypoglycemia Plan: For now continue glimepiride, Januvia, check A1c, further advised with results. HTN: Ambulatory BPs in the 160, 257 with diastolic BPs in the 49T. Currently on: Metoprolol XL 50: Increase to 100 mg, watch for breathing problems Amlodipine 5 mg: Still have some edema but is better, declined to stop amlodipine Diovan and HCTZ, declined need to combine that in a single tablet. Continue monitoring BPs.  See AVS. Gait disorder: Parking permit signed, did have 1 fall, has developed a wound @ L arm, see picture.  The edges of the wound are slightly warm and red, early cellulitis? Rec  local care, see AVS.  Keflex for 5 days, call if no better. Wound, L arm: see above  COPD asthma: Currently well controlled, watch for increased symptoms now that we are increasing metoprolol. RTC 3 months

## 2020-06-17 NOTE — Progress Notes (Signed)
Subjective:    Patient ID: Rebecca Elliott, female    DOB: 18-Aug-1926, 87 y.o.   MRN: [id number]  DOS:  06/17/2020 Type of visit - description: Routine office visit, here with her daughter  Today with talk about hypertension, diabetes, COPD. Good compliance with medication. She did have 1 fall last week, developed a wound at the left arm. The area looks slightly red, denies any fever, chills.  No discharge.  They have been putting Neosporin on it  Review of Systems See above   Past Medical History:  Diagnosis Date  . Allergic rhinitis   . COPD  and ASTHMA   . Depression   . Diabetes mellitus    as an adult  . DJD (degenerative joint disease)   . GERD (gastroesophageal reflux disease)    and HH w/ reflux  . History of colonic polyps   . Hyperlipidemia   . Hypertension     Past Surgical History:  Procedure Laterality Date  . ABDOMINAL HYSTERECTOMY     apparently no oophorectomy  . APPENDECTOMY    . bilateral breast tumor    . BREAST EXCISIONAL BIOPSY Bilateral    one in her 20's and one in her 50's, both benign  . CATARACT EXTRACTION Bilateral    [place]  . CATARACT EXTRACTION, BILATERAL    . CHOLECYSTECTOMY    . TONSILLECTOMY    . TUBAL LIGATION      Allergies as of 06/17/2020   No Known Allergies     Medication List       Accurate as of Jun 17, 2020 10:25 AM. If you have any questions, ask your nurse or doctor.        acetaminophen 500 MG tablet Commonly known as: TYLENOL Take 500 mg by mouth as needed.   albuterol 108 (90 Base) MCG/ACT inhaler Commonly known as: VENTOLIN HFA Inhale 2 puffs into the lungs every 6 (six) hours as needed for wheezing or shortness of breath.   amLODipine 10 MG tablet Commonly known as: NORVASC Take 0.5 tablets (5 mg total) by mouth daily. What changed: how much to take Changed by: Kathlene November, MD   arformoterol 15 MCG/2ML Nebu Commonly known as: BROVANA Take 2 mLs (15 mcg total) by nebulization in the morning and at  bedtime.   aspirin 81 MG tablet Take 81 mg by mouth daily.   atorvastatin 10 MG tablet Commonly known as: LIPITOR Take 1 tablet (10 mg total) by mouth daily.   BENEFIBER DRINK MIX PO Take by mouth 2 (two) times daily.   budesonide 0.25 MG/2ML nebulizer solution Commonly known as: Pulmicort Take 2 mLs (0.25 mg total) by nebulization in the morning and at bedtime.   Centrum Silver Chew Chew by mouth.   cephALEXin 500 MG capsule Commonly known as: KEFLEX Take 1 capsule (500 mg total) by mouth 4 (four) times daily. Started by: Kathlene November, MD   dextromethorphan-guaiFENesin 30-600 MG 12hr tablet Commonly known as: MUCINEX DM Take 1 tablet by mouth daily.   diclofenac sodium 1 % Gel Commonly known as: VOLTAREN Apply 2 g topically 4 (four) times daily.   docusate sodium 100 MG capsule Commonly known as: COLACE Take 1-2 by mouth once a day.   famotidine 20 MG tablet Commonly known as: PEPCID Take 1 tablet (20 mg total) by mouth 2 (two) times daily.   glimepiride 2 MG tablet Commonly known as: AMARYL Take 1 tablet (2 mg total) by mouth daily with breakfast.   hydrochlorothiazide 25  MG tablet Commonly known as: HYDRODIURIL Take 1 tablet (25 mg total) by mouth daily.   ipratropium-albuterol 0.5-2.5 (3) MG/3ML Soln Commonly known as: DUONEB Take 3 mLs by nebulization every 6 (six) hours as needed.   Lidocaine 3 % Crea Apply 1 application topically 3 (three) times daily as needed.   loratadine 10 MG tablet Commonly known as: CLARITIN Take 10 mg by mouth daily.   metoprolol succinate 100 MG 24 hr tablet Commonly known as: Toprol XL Take 1 tablet (100 mg total) by mouth daily. Take with or immediately following a meal. What changed:   medication strength  how much to take Changed by: Kathlene November, MD   NONFORMULARY OR COMPOUNDED ITEM Compression Stockings Dx: M79.89   pantoprazole 40 MG tablet Commonly known as: PROTONIX Take 1 tablet (40 mg total) by mouth every  other day.   sitaGLIPtin 50 MG tablet Commonly known as: Januvia Take 1 tablet (50 mg total) by mouth daily.   valsartan 160 MG tablet Commonly known as: DIOVAN Take 1 tablet (160 mg total) by mouth daily.          Objective:   Physical Exam BP (!) 160/72 (BP Location: Right Arm, Patient Position: Sitting, Cuff Size: Small)   Pulse 75   Temp 97.7 F (36.5 C) (Oral)   Resp 20   Ht 5\' 6"  (1.676 m)   Wt 213 lb 6 oz (96.8 kg)   SpO2 93%   BMI 34.44 kg/m  General:   Well developed, NAD, BMI noted. HEENT:  Normocephalic . Face symmetric, atraumatic Lungs:  CTA B Normal respiratory effort, no intercostal retractions, no accessory muscle use. Heart: RRR,  no murmur.  Lower extremities: +/+++  pretibial edema bilaterally  Skin: Not pale. Not jaundice Neurologic:  alert & oriented X3.  Speech normal, gait appropriate for age and assisted via rolling walker Psych--  Cognition and judgment appear intact.  Cooperative with normal attention span and concentration.  Behavior appropriate. No anxious or depressed appearing.        Assessment      ASSESSMENT DM--no neuropathy, Metformin DC 04-2015 d/t creatinine HTN  Hyperlipidemia Depression COPD - Asthma Pulmonary nodules per CXR 03-2014, CT 03-2016, stable, no further CTs needed Anemia, chronic, on-off iron def  Colonoscopy 2004: No polyps EGD 2006 for dysphagia showed hiatal hernia, occult  stricture? Colonoscopy 2010 showed diverticuli Dysphagia: Barium swallow showed dysmotility 08-2014 DJD GERD OSA DX 05/2019  Macular degeneration currently L  Branch retinal vein occlusion  PLAN: DM: Last A1c elevated, I recommended pioglitazone but the family declined because she already has some lower extremity edema. No ambulatory CBGs, the daughter thinks it will be very difficult to check blood sugars.  No clear-cut symptoms of hypoglycemia Plan: For now continue glimepiride, Januvia, check A1c, further advised with  results. HTN: Ambulatory BPs in the 160, 220 with diastolic BPs in the 25K. Currently on: Metoprolol XL 50: Increase to 100 mg, watch for breathing problems Amlodipine 5 mg: Still have some edema but is better, declined to stop amlodipine Diovan and HCTZ, declined need to combine that in a single tablet. Continue monitoring BPs.  See AVS. Gait disorder: Parking permit signed, did have 1 fall, has developed a wound @ L arm, see picture.  The edges of the wound are slightly warm and red, early cellulitis? Rec  local care, see AVS.  Keflex for 5 days, call if no better. Wound, L arm: see above  COPD asthma: Currently well controlled, watch for  increased symptoms now that we are increasing metoprolol. RTC 3 months  This visit occurred during the SARS-CoV-2 public health emergency.  Safety protocols were in place, including screening questions prior to the visit, additional usage of staff PPE, and extensive cleaning of exam room while observing appropriate contact time as indicated for disinfecting solutions.

## 2020-06-17 NOTE — Patient Instructions (Addendum)
Check the  blood pressure   BP GOAL is between 110/65 and  135/85. If it is consistently higher or lower, let me know   Keep the wound open as much as you can  Okay to use soap and water then  pat it dry  Take antibiotics for 5 days (cephalexin)  Call if not gradually better  Increase metoprolol XL from 50 mg to 100 mg and continue checking your blood pressures.  If that make you cough Oraqix more please let me know   GO TO THE LAB : Get the blood work     [place], PLEASE SCHEDULE YOUR APPOINTMENTS Come back for  A check up in 3 months

## 2020-06-19 ENCOUNTER — Encounter (INDEPENDENT_AMBULATORY_CARE_PROVIDER_SITE_OTHER): Payer: Medicare Other | Admitting: Ophthalmology

## 2020-06-24 ENCOUNTER — Other Ambulatory Visit: Payer: Self-pay | Admitting: Internal Medicine

## 2020-06-25 ENCOUNTER — Other Ambulatory Visit: Payer: Self-pay | Admitting: Internal Medicine

## 2020-06-25 MED ORDER — VALSARTAN 160 MG PO TABS: 160.0000 mg | ORAL_TABLET | Freq: Every day | ORAL | 1 refills | Status: DC

## 2020-06-25 MED ORDER — HYDROCHLOROTHIAZIDE 25 MG PO TABS: 25.0000 mg | ORAL_TABLET | Freq: Every day | ORAL | 1 refills | Status: DC

## 2020-06-30 ENCOUNTER — Encounter (INDEPENDENT_AMBULATORY_CARE_PROVIDER_SITE_OTHER): Payer: Self-pay | Admitting: Ophthalmology

## 2020-06-30 ENCOUNTER — Other Ambulatory Visit: Payer: Self-pay

## 2020-06-30 ENCOUNTER — Ambulatory Visit (INDEPENDENT_AMBULATORY_CARE_PROVIDER_SITE_OTHER): Payer: Medicare Other | Admitting: Ophthalmology

## 2020-06-30 DIAGNOSIS — H35371 Puckering of macula, right eye: Secondary | ICD-10-CM | POA: Diagnosis not present

## 2020-06-30 DIAGNOSIS — H34832 Tributary (branch) retinal vein occlusion, left eye, with macular edema: Secondary | ICD-10-CM

## 2020-06-30 DIAGNOSIS — H353131 Nonexudative age-related macular degeneration, bilateral, early dry stage: Secondary | ICD-10-CM | POA: Diagnosis not present

## 2020-06-30 MED ORDER — BEVACIZUMAB 2.5 MG/0.1ML IZ SOSY
2.5000 mg | PREFILLED_SYRINGE | INTRAVITREAL | Status: AC | PRN
  Administered 2020-06-30: 2.5 mg via INTRAVITREAL

## 2020-06-30 NOTE — Progress Notes (Signed)
06/30/2020     CHIEF COMPLAINT Patient presents for Retina Follow Up (8 week fu OS and Avastin OS/Pt states [facility] OU stable since last visit. Pt denies FOL, floaters, or ocular pain OU. //)   HISTORY OF PRESENT ILLNESS: Rebecca Elliott is a 87 y.o. female who presents to the clinic today for:   HPI     Retina Follow Up           Diagnosis: CRVO/BRVO   Laterality: left eye   Onset: 8 weeks ago   Severity: mild   Duration: 8 weeks   Course: stable   Comments: 8 week fu OS and Avastin OS Pt states VA OU stable since last visit. Pt denies FOL, floaters, or ocular pain OU.          Last edited by Kendra Opitz, COA on 06/30/2020  3:00 PM.      Referring physician: Colon Branch, MD [address] [place],   [address]  HISTORICAL INFORMATION:   Selected notes from the MEDICAL RECORD NUMBER    Lab Results  Component Value Date   HGBA1C 6.5 06/17/2020     CURRENT MEDICATIONS: No current outpatient medications on file. (Ophthalmic Drugs)   No current facility-administered medications for this visit. (Ophthalmic Drugs)   Current Outpatient Medications (Other)  Medication Sig   acetaminophen (TYLENOL) 500 MG tablet Take 500 mg by mouth as needed.   albuterol (VENTOLIN HFA) 108 (90 Base) MCG/ACT inhaler Inhale 2 puffs into the lungs every 6 (six) hours as needed for wheezing or shortness of breath.   amLODipine (NORVASC) 10 MG tablet Take 0.5 tablets (5 mg total) by mouth daily.   arformoterol (BROVANA) 15 MCG/2ML NEBU Take 2 mLs (15 mcg total) by nebulization in the morning and at bedtime.   aspirin 81 MG tablet Take 81 mg by mouth daily.   atorvastatin (LIPITOR) 10 MG tablet Take 1 tablet (10 mg total) by mouth daily.   budesonide (PULMICORT) 0.25 MG/2ML nebulizer solution Take 2 mLs (0.25 mg total) by nebulization in the morning and at bedtime.   cephALEXin (KEFLEX) 500 MG capsule Take 1 capsule (500 mg total) by mouth 4 (four) times daily.    dextromethorphan-guaiFENesin (MUCINEX DM) 30-600 MG per 12 hr tablet Take 1 tablet by mouth daily.   diclofenac sodium (VOLTAREN) 1 % GEL Apply 2 g topically 4 (four) times daily.   docusate sodium (COLACE) 100 MG capsule Take 1-2 by mouth once a day.   famotidine (PEPCID) 20 MG tablet Take 1 tablet (20 mg total) by mouth 2 (two) times daily.   glimepiride (AMARYL) 2 MG tablet Take 1 tablet (2 mg total) by mouth daily with breakfast.   hydrochlorothiazide (HYDRODIURIL) 25 MG tablet Take 1 tablet (25 mg total) by mouth daily.   ipratropium-albuterol (DUONEB) 0.5-2.5 (3) MG/3ML SOLN Take 3 mLs by nebulization every 6 (six) hours as needed.   Lidocaine 3 % CREA Apply 1 application topically 3 (three) times daily as needed.   loratadine (CLARITIN) 10 MG tablet Take 10 mg by mouth daily.   metoprolol succinate (TOPROL XL) 100 MG 24 hr tablet Take 1 tablet (100 mg total) by mouth daily. Take with or immediately following a meal.   Multiple Vitamins-Minerals (CENTRUM SILVER) CHEW Chew by mouth.   NONFORMULARY OR COMPOUNDED ITEM Compression Stockings Dx: M79.89   pantoprazole (PROTONIX) 40 MG tablet Take 1 tablet (40 mg total) by mouth every other day.   sitaGLIPtin (  JANUVIA) 50 MG tablet Take 1 tablet (50 mg total) by mouth daily.   valsartan (DIOVAN) 160 MG tablet Take 1 tablet (160 mg total) by mouth daily.   Wheat Dextrin (BENEFIBER DRINK MIX PO) Take by mouth 2 (two) times daily.   No current facility-administered medications for this visit. (Other)      REVIEW OF SYSTEMS:    ALLERGIES No Known Allergies  PAST MEDICAL HISTORY Past Medical History:  Diagnosis Date   Allergic rhinitis    COPD  and ASTHMA    Depression    Diabetes mellitus    as an adult   DJD (degenerative joint disease)    GERD (gastroesophageal reflux disease)    and HH w/ reflux   History of colonic polyps    Hyperlipidemia    Hypertension    Past Surgical History:  Procedure Laterality Date   ABDOMINAL  HYSTERECTOMY     apparently no oophorectomy   APPENDECTOMY     bilateral breast tumor     BREAST EXCISIONAL BIOPSY Bilateral    one in her 20's and one in her 48's, both benign   CATARACT EXTRACTION Bilateral    Groat   CATARACT EXTRACTION, BILATERAL     CHOLECYSTECTOMY     TONSILLECTOMY     TUBAL LIGATION      FAMILY HISTORY Family History  Problem Relation Age of Onset   Breast cancer Sister        x 2   Diabetes Father    Diabetes Sister    Heart attack Brother        early 12s   Heart attack Son    Pancreatic cancer Son    Stomach cancer Sister    Colon cancer Neg Hx     SOCIAL HISTORY Social History   Tobacco Use   Smoking status: Former    Packs/day: 1.00    Years: 35.00    Pack years: 35.00    Types: Cigarettes    Quit date: 01/18/1993    Years since quitting: 27.4   Smokeless tobacco: Never  Substance Use Topics   Alcohol use: No    Alcohol/week: 0.0 standard drinks   Drug use: No         OPHTHALMIC EXAM:  Base Eye Exam     Visual Acuity (ETDRS)       Right Left   Dist cc 20/60 20/200   Dist ph cc 20/40 NI         Tonometry (Tonopen, 3:04 PM)       Right Left   Pressure 15 13         Pupils       Pupils Dark Light Shape React APD   Right PERRL 4 4 Round Minimal None   Left PERRL 4 4 Round Minimal None         Visual Fields (Counting fingers)       Left Right    Full Full         Extraocular Movement       Right Left    Full Full         Neuro/Psych     Oriented x3: Yes   Mood/Affect: Normal         Dilation     Left eye: 1.0% Mydriacyl, 2.5% Phenylephrine @ 3:04 PM           Slit Lamp and Fundus Exam     External Exam  Right Left   External Normal Normal         Slit Lamp Exam       Right Left   Lids/Lashes Normal Normal   Conjunctiva/Sclera White and quiet White and quiet   Cornea Clear Clear   Anterior Chamber Deep and quiet Deep and quiet   Iris Round and reactive Round and  reactive   Lens Posterior chamber intraocular lens Posterior chamber intraocular lens, Centered posterior chamber intraocular lens   Anterior Vitreous Normal Normal         Fundus Exam       Right Left   Posterior Vitreous Normal Posterior vitreous detachment   Disc Normal Normal   C/D Ratio 0.1 0.1   Macula Epiretinal membrane minor Hard drusen, Intraretinal hemorrhage, Macular thickening, Retinal pigment epithelial mottling, Early age related macular degeneration, Microaneurysm large, no cystoid macular edema   Vessels Normal AV nicking, Tortuous, and 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 Scan locations included subfoveal. Central Foveal Thickness: 276. Progression has been stable. Findings include abnormal foveal contour, retinal drusen , no IRF, no SRF.   Left Eye Quality was good. Scan locations included subfoveal. Central Foveal Thickness: 609. Progression has improved. Findings include no SRF, retinal drusen , cystoid macular edema.   Notes At 8-week interval.  Massive CME has recurred from BRVO.  Repeat injection today and examination next in 5 weeks     Intravitreal Injection, Pharmacologic Agent - OS - Left Eye       Time Out 06/30/2020. 3:55 PM. Confirmed correct patient, procedure, site, and patient consented.   Anesthesia Topical anesthesia was used. Anesthetic medications included Akten 3.5%.   Procedure Preparation included Tobramycin 0.3%. A 30 gauge needle was used.   Injection: 2.5 mg bevacizumab 2.5 MG/0.1ML   Route: Intravitreal   NDC: [phone], Lot: [id number]   Post-op Post injection exam found visual acuity of at least counting fingers. The patient tolerated the procedure well. There were no  complications. The patient received written and verbal post procedure care education. Post injection medications were not given.              ASSESSMENT/PLAN:  Branch retinal vein occlusion with macular edema of left eye At 8-week follow-up, massive increase in recurrence of CME from BRVO.  We will repeat injection today and examination next in 5 weeks  Early stage nonexudative age-related macular degeneration of both eyes No signs of wet AMD OD or OS  Macular pucker, right eye Minor OD by exam on OCT, observe     ICD-10-CM   1. Branch retinal vein occlusion with macular edema of left eye  H34.8320 OCT, Retina - OU - Both Eyes    Intravitreal Injection, Pharmacologic Agent - OS - Left Eye    bevacizumab (AVASTIN) SOSY 2.5 mg    2. Early stage nonexudative age-related macular degeneration of both eyes  H35.3131     3. Macular pucker, right eye  H35.371       1.  Significant recurrence of CME from BRVO at longer follow-up interval of 8 weeks.  Will need repeat injection today and examination next in 5 weeks  2.  Dilate OS next  in 5 weeks possible Avastin  3.  Ophthalmic Meds Ordered this visit:  Meds ordered this encounter  Medications   bevacizumab (AVASTIN) SOSY 2.5 mg       Return in about 5 weeks (around 08/04/2020) for dilate, OS, AVASTIN OCT.  There are no Patient Instructions on file for this visit.   Explained the diagnoses, plan, and follow up with the patient and they expressed understanding.  Patient expressed understanding of the importance of proper follow up care.   Clent Demark Jenai Scaletta M.D. Diseases & Surgery of the Retina and Vitreous [facility] [place] 06/30/20     Abbreviations: M myopia (nearsighted); A astigmatism; H hyperopia (farsighted); P presbyopia; Mrx spectacle prescription;  CTL contact lenses; OD right eye; OS left eye; OU both eyes  XT exotropia; ET esotropia; PEK punctate epithelial keratitis; PEE punctate epithelial  erosions; DES dry eye syndrome; MGD meibomian gland dysfunction; ATs artificial tears; PFAT's preservative free artificial tears; [place] nuclear sclerotic cataract; PSC posterior subcapsular cataract; ERM epi-retinal membrane; PVD posterior vitreous detachment; RD retinal detachment; DM diabetes mellitus; DR diabetic retinopathy; NPDR non-proliferative diabetic retinopathy; PDR proliferative diabetic retinopathy; CSME clinically significant macular edema; DME diabetic macular edema; dbh dot blot hemorrhages; CWS cotton wool spot; POAG primary open angle glaucoma; C/D cup-to-disc ratio; HVF humphrey visual field; GVF goldmann visual field; OCT optical coherence tomography; IOP intraocular pressure; BRVO Branch retinal vein occlusion; CRVO central retinal vein occlusion; CRAO central retinal artery occlusion; BRAO branch retinal artery occlusion; RT retinal tear; SB scleral buckle; PPV pars plana vitrectomy; VH Vitreous hemorrhage; PRP panretinal laser photocoagulation; IVK intravitreal kenalog; VMT vitreomacular traction; MH Macular hole;  NVD neovascularization of the disc; NVE neovascularization elsewhere; AREDS age related eye disease study; ARMD age related macular degeneration; POAG primary open angle glaucoma; EBMD epithelial/anterior basement membrane dystrophy; ACIOL anterior chamber intraocular lens; IOL intraocular lens; PCIOL posterior chamber intraocular lens; Phaco/IOL phacoemulsification with intraocular lens placement; [place] photorefractive keratectomy; LASIK laser assisted in situ keratomileusis; HTN hypertension; DM diabetes mellitus; COPD chronic obstructive pulmonary disease

## 2020-06-30 NOTE — Assessment & Plan Note (Signed)
At 8-week follow-up, massive increase in recurrence of CME from BRVO.  We will repeat injection today and examination next in 5 weeks

## 2020-06-30 NOTE — Assessment & Plan Note (Signed)
Minor OD by exam on OCT, observe

## 2020-06-30 NOTE — Assessment & Plan Note (Signed)
No signs of wet AMD OD or OS

## 2020-08-04 ENCOUNTER — Encounter (INDEPENDENT_AMBULATORY_CARE_PROVIDER_SITE_OTHER): Payer: Medicare Other | Admitting: Ophthalmology

## 2020-08-05 ENCOUNTER — Ambulatory Visit: Payer: Medicare Other | Admitting: Internal Medicine

## 2020-08-06 ENCOUNTER — Ambulatory Visit (HOSPITAL_BASED_OUTPATIENT_CLINIC_OR_DEPARTMENT_OTHER)
Admission: RE | Admit: 2020-08-06 | Discharge: 2020-08-06 | Disposition: A | Discharge status: Home / Self Care | Payer: Medicare Other | Source: Ambulatory Visit | Attending: Internal Medicine | Admitting: Internal Medicine

## 2020-08-06 ENCOUNTER — Encounter: Payer: Self-pay | Admitting: Internal Medicine

## 2020-08-06 ENCOUNTER — Other Ambulatory Visit: Payer: Self-pay

## 2020-08-06 ENCOUNTER — Ambulatory Visit (INDEPENDENT_AMBULATORY_CARE_PROVIDER_SITE_OTHER): Payer: Medicare Other | Admitting: Internal Medicine

## 2020-08-06 VITALS — BP 176/72 | HR 59 | Temp 98.0°F | Resp 18 | Ht 66.0 in | Wt 210.4 lb

## 2020-08-06 DIAGNOSIS — I10 Essential (primary) hypertension: Secondary | ICD-10-CM

## 2020-08-06 DIAGNOSIS — J441 Chronic obstructive pulmonary disease with (acute) exacerbation: Secondary | ICD-10-CM | POA: Insufficient documentation

## 2020-08-06 DIAGNOSIS — J449 Chronic obstructive pulmonary disease, unspecified: Secondary | ICD-10-CM | POA: Diagnosis not present

## 2020-08-06 DIAGNOSIS — J439 Emphysema, unspecified: Secondary | ICD-10-CM | POA: Diagnosis not present

## 2020-08-06 DIAGNOSIS — J90 Pleural effusion, not elsewhere classified: Secondary | ICD-10-CM | POA: Diagnosis not present

## 2020-08-06 DIAGNOSIS — R0602 Shortness of breath: Secondary | ICD-10-CM | POA: Diagnosis not present

## 2020-08-06 DIAGNOSIS — D649 Anemia, unspecified: Secondary | ICD-10-CM | POA: Diagnosis not present

## 2020-08-06 DIAGNOSIS — J9811 Atelectasis: Secondary | ICD-10-CM | POA: Diagnosis not present

## 2020-08-06 LAB — BASIC METABOLIC PANEL
BUN: 29 mg/dL — ABNORMAL HIGH (ref 6–23)
CO2: 30 mEq/L (ref 19–32)
Calcium: 9 mg/dL (ref 8.4–10.5)
Chloride: 99 mEq/L (ref 96–112)
Creatinine, Ser: 1.49 mg/dL — ABNORMAL HIGH (ref 0.40–1.20)
GFR: 30.31 mL/min — ABNORMAL LOW (ref >60.00)
Glucose, Bld: 106 mg/dL — ABNORMAL HIGH (ref 70–99)
Potassium: 4.2 mEq/L (ref 3.5–5.1)
Sodium: 137 mEq/L (ref 135–145)

## 2020-08-06 LAB — CBC WITH DIFFERENTIAL/PLATELET
Basophils Absolute: 0.1 10*3/uL (ref 0.0–0.1)
Basophils Relative: 0.6 % (ref 0.0–3.0)
Eosinophils Absolute: 0.2 10*3/uL (ref 0.0–0.7)
Eosinophils Relative: 2 % (ref 0.0–5.0)
HCT: 33.6 % — ABNORMAL LOW (ref 36.0–46.0)
Hemoglobin: 10.5 g/dL — ABNORMAL LOW (ref 12.0–15.0)
Lymphocytes Relative: 27.8 % (ref 12.0–46.0)
Lymphs Abs: 3.1 10*3/uL (ref 0.7–4.0)
MCHC: 31.3 g/dL (ref 30.0–36.0)
MCV: 79.9 fl (ref 78.0–100.0)
Monocytes Absolute: 1 10*3/uL (ref 0.1–1.0)
Monocytes Relative: 8.6 % (ref 3.0–12.0)
Neutro Abs: 6.8 10*3/uL (ref 1.4–7.7)
Neutrophils Relative %: 61 % (ref 43.0–77.0)
Platelets: 249 10*3/uL (ref 150.0–400.0)
RBC: 4.21 Mil/uL (ref 3.87–5.11)
RDW: 15 % (ref 11.5–15.5)
WBC: 11.1 10*3/uL — ABNORMAL HIGH (ref 4.0–10.5)

## 2020-08-06 LAB — IRON: Iron: 38 ug/dL — ABNORMAL LOW (ref 42–145)

## 2020-08-06 LAB — FERRITIN: Ferritin: 33.1 ng/mL (ref 10.0–291.0)

## 2020-08-06 MED ORDER — DOXYCYCLINE HYCLATE 100 MG PO TABS: 100.0000 mg | ORAL_TABLET | Freq: Two times a day (BID) | ORAL | 0 refills | Status: DC

## 2020-08-06 MED ORDER — HYDRALAZINE HCL 10 MG PO TABS: 10.0000 mg | ORAL_TABLET | Freq: Three times a day (TID) | ORAL | 3 refills | Status: DC

## 2020-08-06 NOTE — Progress Notes (Signed)
Subjective:    Patient ID: Rebecca Elliott, female    DOB: 1926/08/25, 87 y.o.   MRN: [id number]  DOS:  08/06/2020 Type of visit - description: Follow-up, here with her daughter  Several concerns: 1 week history of increased respiratory symptoms, increased cough and very pronounced increase of his sputum production.  The sputum is clear to yellowish. Other family members have URIs. Home COVID test was negative. "I feel like when I had walking pneumonia".  HTN: Remains a slightly elevated at home in the 150-160 range. Continue to report leg swelling, she show me a picture and the lower extremity edema was noticeable.  Complains of "stomach swelling", could not give me more specific description denies nausea vomiting or blood in the stools. No postprandial abdominal pain. No nausea or vomiting.  Wt Readings from Last 3 Encounters:  08/06/20 210 lb 6 oz (95.4 kg)  06/17/20 213 lb 6 oz (96.8 kg)  04/17/20 216 lb 12.8 oz (98.3 kg)      Review of Systems See above  Also, + chest pain with cough.  Shortness of breath at baseline.  No fever chills   Past Medical History:  Diagnosis Date   Allergic rhinitis    COPD  and ASTHMA    Depression    Diabetes mellitus    as an adult   DJD (degenerative joint disease)    GERD (gastroesophageal reflux disease)    and HH w/ reflux   History of colonic polyps    Hyperlipidemia    Hypertension     Past Surgical History:  Procedure Laterality Date   ABDOMINAL HYSTERECTOMY     apparently no oophorectomy   APPENDECTOMY     bilateral breast tumor     BREAST EXCISIONAL BIOPSY Bilateral    one in her 20's and one in her 62's, both benign   CATARACT EXTRACTION Bilateral    Groat   CATARACT EXTRACTION, BILATERAL     CHOLECYSTECTOMY     TONSILLECTOMY     TUBAL LIGATION      Allergies as of 08/06/2020   No Known Allergies      Medication List        Accurate as of August 06, 2020 11:59 PM. If you have any questions, ask your  nurse or doctor.          STOP taking these medications    amLODipine 10 MG tablet Commonly known as: NORVASC Stopped by: Kathlene November, MD       TAKE these medications    acetaminophen 500 MG tablet Commonly known as: TYLENOL Take 500 mg by mouth as needed.   albuterol 108 (90 Base) MCG/ACT inhaler Commonly known as: VENTOLIN HFA Inhale 2 puffs into the lungs every 6 (six) hours as needed for wheezing or shortness of breath.   arformoterol 15 MCG/2ML Nebu Commonly known as: BROVANA Take 2 mLs (15 mcg total) by nebulization in the morning and at bedtime.   aspirin 81 MG tablet Take 81 mg by mouth daily.   atorvastatin 10 MG tablet Commonly known as: LIPITOR Take 1 tablet (10 mg total) by mouth daily.   BENEFIBER DRINK MIX PO Take by mouth 2 (two) times daily.   budesonide 0.25 MG/2ML nebulizer solution Commonly known as: Pulmicort Take 2 mLs (0.25 mg total) by nebulization in the morning and at bedtime.   Centrum Silver Chew Chew by mouth.   cephALEXin 500 MG capsule Commonly known as: KEFLEX Take 1 capsule (500 mg total) by  mouth 4 (four) times daily.   dextromethorphan-guaiFENesin 30-600 MG 12hr tablet Commonly known as: MUCINEX DM Take 1 tablet by mouth daily.   diclofenac sodium 1 % Gel Commonly known as: VOLTAREN Apply 2 g topically 4 (four) times daily.   docusate sodium 100 MG capsule Commonly known as: COLACE Take 1-2 by mouth once a day.   doxycycline 100 MG tablet Commonly known as: VIBRA-TABS Take 1 tablet (100 mg total) by mouth 2 (two) times daily. Started by: Kathlene November, MD   famotidine 20 MG tablet Commonly known as: PEPCID Take 1 tablet (20 mg total) by mouth 2 (two) times daily.   glimepiride 2 MG tablet Commonly known as: AMARYL Take 1 tablet (2 mg total) by mouth daily with breakfast.   hydrALAZINE 10 MG tablet Commonly known as: APRESOLINE Take 1 tablet (10 mg total) by mouth 3 (three) times daily. Started by: Kathlene November, MD    hydrochlorothiazide 25 MG tablet Commonly known as: HYDRODIURIL Take 1 tablet (25 mg total) by mouth daily.   ipratropium-albuterol 0.5-2.5 (3) MG/3ML Soln Commonly known as: DUONEB Take 3 mLs by nebulization every 6 (six) hours as needed.   Lidocaine 3 % Crea Apply 1 application topically 3 (three) times daily as needed.   loratadine 10 MG tablet Commonly known as: CLARITIN Take 10 mg by mouth daily.   metoprolol succinate 100 MG 24 hr tablet Commonly known as: Toprol XL Take 1 tablet (100 mg total) by mouth daily. Take with or immediately following a meal.   NONFORMULARY OR COMPOUNDED ITEM Compression Stockings Dx: M79.89   pantoprazole 40 MG tablet Commonly known as: PROTONIX Take 1 tablet (40 mg total) by mouth every other day.   sitaGLIPtin 50 MG tablet Commonly known as: Januvia Take 1 tablet (50 mg total) by mouth daily.   valsartan 160 MG tablet Commonly known as: DIOVAN Take 1 tablet (160 mg total) by mouth daily.           Objective:   Physical Exam BP (!) 176/72 (BP Location: Left Arm, Patient Position: Sitting, Cuff Size: Small)   Pulse (!) 59   Temp 98 F (36.7 C) (Oral)   Resp 18   Ht 5\' 6"  (1.676 m)   Wt 210 lb 6 oz (95.4 kg)   SpO2 93%   BMI 33.96 kg/m  General:   Well developed, NAD, BMI noted.  HEENT:  Normocephalic . Face symmetric, atraumatic Lungs:  decreased breath sounds, some wheezing, mild.  No crackles, no rhonchi Normal respiratory effort, no intercostal retractions, no accessory muscle use. Heart: RRR,  no murmur.  Abdomen:  Not distended, soft, non-tender. No rebound or rigidity.   Skin: Not pale. Not jaundice Lower extremities: +/+++ pretibial edema bilaterally  Neurologic:  alert & oriented X3.  Speech normal, gait assisted by walker, needed help transferring.   Psych--  Cognition and judgment appear intact.  Cooperative with normal attention span and concentration.  Behavior appropriate. No anxious or depressed  appearing.     Assessment      ASSESSMENT DM--no neuropathy, Metformin DC 04-2015 d/t creatinine HTN  Hyperlipidemia Depression COPD - Asthma Pulmonary nodules per CXR 03-2014, CT 03-2016, stable, no further CTs needed Anemia, chronic, on-off iron def  Colonoscopy 2004: No polyps EGD 2006 for dysphagia showed hiatal hernia, occult  stricture? Colonoscopy 2010 showed diverticuli Dysphagia: Barium swallow showed dysmotility 08-2014 DJD GERD OSA DX 05/2019  Macular degeneration currently L  Branch retinal vein occlusion  PLAN: DM: Last A1c was  6.5.  No change HTN: Continue to be elevated.  Continue to complain about lower extremity edema, she show me a picture and it was indeed swollen ~ ++/+++. Plan: DC amlodipine, start hydralazine 10 mg 3 times daily. Continue HCTZ 25 mg, Metoprolol XL 100 mg 1 tablet daily, Diovan 160 mg 1 tablet daily.  Check BMP Monitor BPs, reassessing 4 to 6 weeks COPD exacerbation: As described above, check a chest x-ray, start doxycycline.  Continue nebs,  O2 sats at home 93%, continue monitoring. Mild anemia: Noted on last labs, recheck. "Stomach swelling": Ill-defined symptoms, checking a CBC, observe for now. RTC 4 to 6 weeks  This visit occurred during the SARS-CoV-2 public health emergency.  Safety protocols were in place, including screening questions prior to the visit, additional usage of staff PPE, and extensive cleaning of exam room while observing appropriate contact time as indicated for disinfecting solutions.

## 2020-08-06 NOTE — Patient Instructions (Signed)
Stop amlodipine  Start hydralazine 10 mg 3 times daily  Check the  blood pressure   daily BP GOAL is between 110/65 and  135/85. If it is consistently higher or lower, let me know  Mucinex DM as needed Continue nebulizations Doxycycline for 1 week (antibiotic). Call if not gradually better  GO TO THE LAB : Get the blood work     [place], [place] back for checkup in 4 to 6 weeks  STOP BY THE FIRST FLOOR:  get the XR

## 2020-08-07 NOTE — Assessment & Plan Note (Signed)
DM: Last A1c was 6.5.  No change HTN: Continue to be elevated.  Continue to complain about lower extremity edema, she show me a picture and it was indeed swollen ~ ++/+++. Plan: DC amlodipine, start hydralazine 10 mg 3 times daily. Continue HCTZ 25 mg, Metoprolol XL 100 mg 1 tablet daily, Diovan 160 mg 1 tablet daily.  Check BMP Monitor BPs, reassessing 4 to 6 weeks COPD exacerbation: As described above, check a chest x-ray, start doxycycline.  Continue nebs,  O2 sats at home 93%, continue monitoring. Mild anemia: Noted on last labs, recheck. "Stomach swelling": Ill-defined symptoms, checking a CBC, observe for now. RTC 4 to 6 weeks

## 2020-08-08 ENCOUNTER — Other Ambulatory Visit: Payer: Self-pay | Admitting: Internal Medicine

## 2020-08-12 ENCOUNTER — Other Ambulatory Visit: Payer: Self-pay

## 2020-08-12 ENCOUNTER — Ambulatory Visit (INDEPENDENT_AMBULATORY_CARE_PROVIDER_SITE_OTHER): Payer: Medicare Other | Admitting: Ophthalmology

## 2020-08-12 ENCOUNTER — Encounter (INDEPENDENT_AMBULATORY_CARE_PROVIDER_SITE_OTHER): Payer: Self-pay | Admitting: Ophthalmology

## 2020-08-12 DIAGNOSIS — H34832 Tributary (branch) retinal vein occlusion, left eye, with macular edema: Secondary | ICD-10-CM | POA: Diagnosis not present

## 2020-08-12 MED ORDER — BEVACIZUMAB 2.5 MG/0.1ML IZ SOSY
2.5000 mg | PREFILLED_SYRINGE | INTRAVITREAL | Status: AC | PRN
  Administered 2020-08-12: 2.5 mg via INTRAVITREAL

## 2020-08-12 NOTE — Assessment & Plan Note (Signed)
At 6-week follow-up today, CME has recurred from BRVO.  But nonetheless improved from 6 weeks previous.  We will repeat injection Avastin today to try to clear the CME and return follow-up visit in 5 weeks

## 2020-08-12 NOTE — Progress Notes (Signed)
08/12/2020     CHIEF COMPLAINT Patient presents for Retina Follow Up (6 week fu OS and Avastin OS/Pt states, "I am pretty sure that my vision is worse than it was. It seems to be more blurred."/)   HISTORY OF PRESENT ILLNESS: Rebecca Elliott is a 87 y.o. female who presents to the clinic today for:   HPI     Retina Follow Up           Diagnosis: CRVO/BRVO   Laterality: left eye   Onset: 6 weeks ago   Severity: mild   Duration: 6 weeks   Course: gradually worsening   Comments: 6 week fu OS and Avastin OS Pt states, "I am pretty sure that my vision is worse than it was. It seems to be more blurred."        Last edited by Kendra Opitz, COA on 08/12/2020  3:32 PM.      Referring physician: Colon Branch, MD [address] [place],  [place] [address]  HISTORICAL INFORMATION:   Selected notes from the MEDICAL RECORD NUMBER    Lab Results  Component Value Date   HGBA1C 6.5 06/17/2020     CURRENT MEDICATIONS: No current outpatient medications on file. (Ophthalmic Drugs)   No current facility-administered medications for this visit. (Ophthalmic Drugs)   Current Outpatient Medications (Other)  Medication Sig   acetaminophen (TYLENOL) 500 MG tablet Take 500 mg by mouth as needed.   albuterol (VENTOLIN HFA) 108 (90 Base) MCG/ACT inhaler Inhale 2 puffs into the lungs every 6 (six) hours as needed for wheezing or shortness of breath.   arformoterol (BROVANA) 15 MCG/2ML NEBU Take 2 mLs (15 mcg total) by nebulization in the morning and at bedtime.   aspirin 81 MG tablet Take 81 mg by mouth daily.   atorvastatin (LIPITOR) 10 MG tablet Take 1 tablet (10 mg total) by mouth daily.   budesonide (PULMICORT) 0.25 MG/2ML nebulizer solution Take 2 mLs (0.25 mg total) by nebulization in the morning and at bedtime.   cephALEXin (KEFLEX) 500 MG capsule Take 1 capsule (500 mg total) by mouth 4 (four) times daily.   dextromethorphan-guaiFENesin (MUCINEX DM) 30-600 MG per 12 hr  tablet Take 1 tablet by mouth daily.   diclofenac sodium (VOLTAREN) 1 % GEL Apply 2 g topically 4 (four) times daily.   docusate sodium (COLACE) 100 MG capsule Take 1-2 by mouth once a day.   doxycycline (VIBRA-TABS) 100 MG tablet Take 1 tablet (100 mg total) by mouth 2 (two) times daily.   famotidine (PEPCID) 20 MG tablet Take 1 tablet (20 mg total) by mouth 2 (two) times daily.   glimepiride (AMARYL) 2 MG tablet Take 1 tablet (2 mg total) by mouth daily with breakfast.   hydrALAZINE (APRESOLINE) 10 MG tablet Take 1 tablet (10 mg total) by mouth 3 (three) times daily.   hydrochlorothiazide (HYDRODIURIL) 25 MG tablet Take 1 tablet (25 mg total) by mouth daily.   ipratropium-albuterol (DUONEB) 0.5-2.5 (3) MG/3ML SOLN Take 3 mLs by nebulization every 6 (six) hours as needed.   Lidocaine 3 % CREA Apply 1 application topically 3 (three) times daily as needed.   loratadine (CLARITIN) 10 MG tablet Take 10 mg by mouth daily.   metoprolol succinate (TOPROL XL) 100 MG 24 hr tablet Take 1 tablet (100 mg total) by mouth daily. Take with or immediately following a meal.   Multiple Vitamins-Minerals (CENTRUM SILVER) CHEW Chew by mouth.   NONFORMULARY  OR COMPOUNDED ITEM Compression Stockings Dx: M79.89   pantoprazole (PROTONIX) 40 MG tablet Take 1 tablet (40 mg total) by mouth every other day.   sitaGLIPtin (JANUVIA) 50 MG tablet Take 1 tablet (50 mg total) by mouth daily.   valsartan (DIOVAN) 160 MG tablet Take 1 tablet (160 mg total) by mouth daily.   Wheat Dextrin (BENEFIBER DRINK MIX PO) Take by mouth 2 (two) times daily.   No current facility-administered medications for this visit. (Other)      REVIEW OF SYSTEMS:    ALLERGIES No Known Allergies  PAST MEDICAL HISTORY Past Medical History:  Diagnosis Date   Allergic rhinitis    COPD  and ASTHMA    Depression    Diabetes mellitus    as an adult   DJD (degenerative joint disease)    GERD (gastroesophageal reflux disease)    and HH w/  reflux   History of colonic polyps    Hyperlipidemia    Hypertension    Past Surgical History:  Procedure Laterality Date   ABDOMINAL HYSTERECTOMY     apparently no oophorectomy   APPENDECTOMY     bilateral breast tumor     BREAST EXCISIONAL BIOPSY Bilateral    one in her 20's and one in her 57's, both benign   CATARACT EXTRACTION Bilateral    Groat   CATARACT EXTRACTION, BILATERAL     CHOLECYSTECTOMY     TONSILLECTOMY     TUBAL LIGATION      FAMILY HISTORY Family History  Problem Relation Age of Onset   Breast cancer Sister        x 2   Diabetes Father    Diabetes Sister    Heart attack Brother        early 31s   Heart attack Son    Pancreatic cancer Son    Stomach cancer Sister    Colon cancer Neg Hx     SOCIAL HISTORY Social History   Tobacco Use   Smoking status: Former    Packs/day: 1.00    Years: 35.00    Pack years: 35.00    Types: Cigarettes    Quit date: 01/18/1993    Years since quitting: 27.5   Smokeless tobacco: Never  Substance Use Topics   Alcohol use: No    Alcohol/week: 0.0 standard drinks   Drug use: No         OPHTHALMIC EXAM:  Base Eye Exam     Visual Acuity (ETDRS)       Right Left   Dist cc 20/50 20/200   Dist ph cc 20/40 -1 NI    Correction: Glasses         Tonometry (Tonopen, 3:35 PM)       Right Left   Pressure 12 12         Pupils       Pupils Dark Light Shape React APD   Right PERRL 4 4 Round Minimal None   Left PERRL 4 4 Round Minimal None         Visual Fields (Counting fingers)       Left Right    Full Full         Extraocular Movement       Right Left    Full Full         Neuro/Psych     Oriented x3: Yes   Mood/Affect: Normal         Dilation     Left eye:  1.0% Mydriacyl, 2.5% Phenylephrine @ 3:35 PM           Slit Lamp and Fundus Exam     External Exam       Right Left   External Normal Normal         Slit Lamp Exam       Right Left   Lids/Lashes Normal  Normal   Conjunctiva/Sclera White and quiet White and quiet   Cornea Clear Clear   Anterior Chamber Deep and quiet Deep and quiet   Iris Round and reactive Round and reactive   Lens Posterior chamber intraocular lens Posterior chamber intraocular lens, Centered posterior chamber intraocular lens   Anterior Vitreous Normal Normal         Fundus Exam       Right Left   Posterior Vitreous Normal Posterior vitreous detachment   Disc Normal Normal   C/D Ratio 0.1 0.1   Macula Epiretinal membrane minor Hard drusen, Intraretinal hemorrhage, Macular thickening, Retinal pigment epithelial mottling, Early age related macular degeneration, Microaneurysm large, no cystoid macular edema   Vessels Normal AV nicking, Tortuous, and 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 Scan locations included subfoveal. Central Foveal Thickness: 276. Progression has been stable. Findings include abnormal foveal contour, retinal drusen , no IRF, no SRF.   Left Eye Quality was good. Scan locations included subfoveal. Central Foveal Thickness: 494. Progression has improved. Findings include no SRF, retinal drusen , cystoid macular edema.   Notes At 6-week interval.  Massive CME has recurred from BRVO.  Repeat injection today and examination next in 5 weeks     Intravitreal Injection, Pharmacologic Agent - OS - Left Eye       Time Out 08/12/2020. 4:00 PM. Confirmed correct patient, procedure, site, and patient consented.   Anesthesia Topical anesthesia was used. Anesthetic medications included Akten 3.5%.   Procedure Preparation included Tobramycin 0.3%, 10% betadine to eyelids, 5% betadine to ocular surface. A 30 gauge needle was used.   Injection: 2.5 mg  bevacizumab 2.5 MG/0.1ML   Route: Intravitreal, Site: Left Eye   NDC: [phone], Lot: [id number]   Post-op Post injection exam found visual acuity of at least counting fingers. The patient tolerated the procedure well. There were no complications. The patient received written and verbal post procedure care education. Post injection medications were not given.              ASSESSMENT/PLAN:  Branch retinal vein occlusion with macular edema of left eye At 6-week follow-up today, CME has recurred from BRVO.  But nonetheless improved from 6 weeks previous.  We will repeat injection Avastin today to try to clear the CME and return follow-up visit in 5 weeks     ICD-10-CM   1. Branch retinal vein occlusion with macular edema of left eye  H34.8320 OCT, Retina - OU - Both Eyes    Intravitreal Injection, Pharmacologic Agent - OS - Left Eye    bevacizumab (AVASTIN) SOSY 2.5 mg      1.  OS with macular CME secondary to branch retinal vein occlusion.  Improved at 6-week but still very active as compared to  3.5 months previous we will repeat  Avastin today and follow-up again in 5 weeks  2.  3.  Ophthalmic Meds Ordered this visit:  Meds ordered this encounter  Medications   bevacizumab (AVASTIN) SOSY 2.5 mg       Return in about 5 weeks (around 09/16/2020) for dilate, OS, AVASTIN OCT.  There are no Patient Instructions on file for this visit.   Explained the diagnoses, plan, and follow up with the patient and they expressed understanding.  Patient expressed understanding of the importance of proper follow up care.   Clent Demark Laisha Rau M.D. Diseases & Surgery of the Retina and Vitreous [facility] [place] 08/12/20     Abbreviations: M myopia (nearsighted); A astigmatism; H hyperopia (farsighted); P presbyopia; Mrx spectacle prescription;  CTL contact lenses; OD right eye; OS left eye; OU both eyes  XT exotropia; ET esotropia; PEK punctate epithelial keratitis; PEE  punctate epithelial erosions; DES dry eye syndrome; MGD meibomian gland dysfunction; ATs artificial tears; PFAT's preservative free artificial tears; [place] nuclear sclerotic cataract; PSC posterior subcapsular cataract; ERM epi-retinal membrane; PVD posterior vitreous detachment; RD retinal detachment; DM diabetes mellitus; DR diabetic retinopathy; NPDR non-proliferative diabetic retinopathy; PDR proliferative diabetic retinopathy; CSME clinically significant macular edema; DME diabetic macular edema; dbh dot blot hemorrhages; CWS cotton wool spot; POAG primary open angle glaucoma; C/D cup-to-disc ratio; HVF humphrey visual field; GVF goldmann visual field; OCT optical coherence tomography; IOP intraocular pressure; BRVO Branch retinal vein occlusion; CRVO central retinal vein occlusion; CRAO central retinal artery occlusion; BRAO branch retinal artery occlusion; RT retinal tear; SB scleral buckle; PPV pars plana vitrectomy; VH Vitreous hemorrhage; PRP panretinal laser photocoagulation; IVK intravitreal kenalog; VMT vitreomacular traction; MH Macular hole;  NVD neovascularization of the disc; NVE neovascularization elsewhere; AREDS age related eye disease study; ARMD age related macular degeneration; POAG primary open angle glaucoma; EBMD epithelial/anterior basement membrane dystrophy; ACIOL anterior chamber intraocular lens; IOL intraocular lens; PCIOL posterior chamber intraocular lens; Phaco/IOL phacoemulsification with intraocular lens placement; [place] photorefractive keratectomy; LASIK laser assisted in situ keratomileusis; HTN hypertension; DM diabetes mellitus; COPD chronic obstructive pulmonary disease

## 2020-09-05 ENCOUNTER — Other Ambulatory Visit: Payer: Self-pay | Admitting: Internal Medicine

## 2020-09-10 ENCOUNTER — Other Ambulatory Visit: Payer: Self-pay

## 2020-09-10 ENCOUNTER — Ambulatory Visit (INDEPENDENT_AMBULATORY_CARE_PROVIDER_SITE_OTHER): Payer: Medicare Other | Admitting: Internal Medicine

## 2020-09-10 ENCOUNTER — Encounter: Payer: Self-pay | Admitting: Internal Medicine

## 2020-09-10 VITALS — BP 166/68 | HR 65 | Temp 98.0°F | Resp 16 | Ht 66.0 in | Wt 202.4 lb

## 2020-09-10 DIAGNOSIS — N390 Urinary tract infection, site not specified: Secondary | ICD-10-CM

## 2020-09-10 DIAGNOSIS — I10 Essential (primary) hypertension: Secondary | ICD-10-CM | POA: Diagnosis not present

## 2020-09-10 DIAGNOSIS — J4540 Moderate persistent asthma, uncomplicated: Secondary | ICD-10-CM | POA: Diagnosis not present

## 2020-09-10 DIAGNOSIS — J411 Mucopurulent chronic bronchitis: Secondary | ICD-10-CM | POA: Diagnosis not present

## 2020-09-10 DIAGNOSIS — E1140 Type 2 diabetes mellitus with diabetic neuropathy, unspecified: Secondary | ICD-10-CM

## 2020-09-10 MED ORDER — HYDRALAZINE HCL 25 MG PO TABS: 25.0000 mg | ORAL_TABLET | Freq: Three times a day (TID) | ORAL | 3 refills | Status: DC

## 2020-09-10 NOTE — Patient Instructions (Addendum)
Recommend the following vaccines at your pharmacy:  Shingrix (shingles) Tdap (tetanus) Flu shot this fall   For your breathing: Brovana and Pulmicort: Twice a day whether you are coughing or not Use the other nebulizers, Ventolin or DuoNeb up to every 6 hours as needed if cough and wheezing persist Mucinex DM  Nausea: Call if symptoms severe or increasing. Be sure you take pantoprazole on an empty stomach early in the morning   Increase hydralazine to 25 mg 3 times a day  Check the  blood pressure regularly  BP GOAL is between 110/65 and  135/85. If it is consistently higher or lower, let me know      [place], [place] back for  a check up in 3 months      Diabetes Mellitus and [place] care is an important part of your health, especially when you have diabetes. Diabetes may cause you to have problems because of poor blood flow (circulation) to your feet and legs, which can cause your skin to: Become thinner and drier. Break more easily. Heal more slowly. Peel and crack. You may also have nerve damage (neuropathy) in your legs and feet, causing decreased feeling in them. This means that you may not notice minor injuries to your feet that could lead to more serious problems. Noticing and addressing any potential problems early is the best wayto prevent future foot problems. How to care for your feet Foot hygiene  Wash your feet daily with warm water and mild soap. Do not use hot water. Then, pat your feet and the areas between your toes until they are completely dry. Do not soak your feet as this can dry your skin. Trim your toenails straight across. Do not dig under them or around the cuticle. File the edges of your nails with an emery board or nail file. Apply a moisturizing lotion or petroleum jelly to the skin on your feet and to dry, brittle toenails. Use lotion that does not contain alcohol and is unscented. Do not apply  lotion between your toes.  Shoes and socks Wear clean socks or stockings every day. Make sure they are not too tight. Do not wear knee-high stockings since they may decrease blood flow to your legs. Wear shoes that fit properly and have enough cushioning. Always look in your shoes before you put them on to be sure there are no objects inside. To break in new shoes, wear them for just a few hours a day. This prevents injuries on your feet. Wounds, scrapes, corns, and calluses  Check your feet daily for blisters, cuts, bruises, sores, and redness. If you cannot see the bottom of your feet, use a mirror or ask someone for help. Do not cut corns or calluses or try to remove them with medicine. If you find a minor scrape, cut, or break in the skin on your feet, keep it and the skin around it clean and dry. You may clean these areas with mild soap and water. Do not clean the area with peroxide, alcohol, or iodine. If you have a wound, scrape, corn, or callus on your foot, look at it several times a day to make sure it is healing and not infected. Check for: Redness, swelling, or pain. Fluid or blood. Warmth. Pus or a bad smell.  General tips Do not cross your legs. This may decrease blood flow to your feet. Do not use heating pads or hot water bottles  on your feet. They may burn your skin. If you have lost feeling in your feet or legs, you may not know this is happening until it is too late. Protect your feet from hot and cold by wearing shoes, such as at the beach or on hot pavement. Schedule a complete foot exam at least once a year (annually) or more often if you have foot problems. Report any cuts, sores, or bruises to your health care provider immediately. Where to find more information American Diabetes Association: www.diabetes.org Association of Diabetes Care & Education Specialists: www.diabeteseducator.org Contact a health care provider if: You have a medical condition that increases  your risk of infection and you have any cuts, sores, or bruises on your feet. You have an injury that is not healing. You have redness on your legs or feet. You feel burning or tingling in your legs or feet. You have pain or cramps in your legs and feet. Your legs or feet are numb. Your feet always feel cold. You have pain around any toenails. Get help right away if: You have a wound, scrape, corn, or callus on your foot and: You have pain, swelling, or redness that gets worse. You have fluid or blood coming from the wound, scrape, corn, or callus. Your wound, scrape, corn, or callus feels warm to the touch. You have pus or a bad smell coming from the wound, scrape, corn, or callus. You have a fever. You have a red line going up your leg. Summary Check your feet every day for blisters, cuts, bruises, sores, and redness. Apply a moisturizing lotion or petroleum jelly to the skin on your feet and to dry, brittle toenails. Wear shoes that fit properly and have enough cushioning. If you have foot problems, report any cuts, sores, or bruises to your health care provider immediately. Schedule a complete foot exam at least once a year (annually) or more often if you have foot problems. This information is not intended to replace advice given to you by your health care provider. Make sure you discuss any questions you have with your healthcare provider. Document Revised: 07/26/2019 Document Reviewed: 07/26/2019 Elsevier Patient Education  [place].

## 2020-09-10 NOTE — Progress Notes (Signed)
Subjective:    Patient ID: Rebecca Elliott, female    DOB: 1926/10/23, 87 y.o.   MRN: [id number]  DOS:  09/10/2020 Type of visit - description: Follow-up Today with talk about hypertension, asthma/COPD, edema, neuropathy. At the last visit, she was prescribed antibiotics for a COPD exacerbation, she improved temporarily far for the last few weeks she is having some cough with abundant whitish sputum. Occasional wheezing. 2 weeks ago she just stopped taking his nebulizers "I was just not feeling well".  Ambulatory BPs in the 160s. Edema has significantly decreased.  Occasionally has nausea, denies vomiting abdominal pain.  Good compliance with antiacids , still have some heartburn.  Admits to feet numbness, worse at night  Wt Readings from Last 3 Encounters:  09/10/20 202 lb 6 oz (91.8 kg)  08/06/20 210 lb 6 oz (95.4 kg)  06/17/20 213 lb 6 oz (96.8 kg)     Review of Systems See above   Past Medical History:  Diagnosis Date   Allergic rhinitis    COPD  and ASTHMA    Depression    Diabetes mellitus    as an adult   DJD (degenerative joint disease)    GERD (gastroesophageal reflux disease)    and HH w/ reflux   History of colonic polyps    Hyperlipidemia    Hypertension     Past Surgical History:  Procedure Laterality Date   ABDOMINAL HYSTERECTOMY     apparently no oophorectomy   APPENDECTOMY     bilateral breast tumor     BREAST EXCISIONAL BIOPSY Bilateral    one in her 20's and one in her 51's, both benign   CATARACT EXTRACTION Bilateral    Groat   CATARACT EXTRACTION, BILATERAL     CHOLECYSTECTOMY     TONSILLECTOMY     TUBAL LIGATION      Allergies as of 09/10/2020   No Known Allergies      Medication List        Accurate as of September 10, 2020 11:59 PM. If you have any questions, ask your nurse or doctor.          STOP taking these medications    cephALEXin 500 MG capsule Commonly known as: KEFLEX Stopped by: Kathlene November, MD   doxycycline 100  MG tablet Commonly known as: VIBRA-TABS Stopped by: Kathlene November, MD       TAKE these medications    acetaminophen 500 MG tablet Commonly known as: TYLENOL Take 500 mg by mouth as needed.   albuterol 108 (90 Base) MCG/ACT inhaler Commonly known as: VENTOLIN HFA Inhale 2 puffs into the lungs every 6 (six) hours as needed for wheezing or shortness of breath.   arformoterol 15 MCG/2ML Nebu Commonly known as: BROVANA Take 2 mLs (15 mcg total) by nebulization in the morning and at bedtime.   aspirin 81 MG tablet Take 81 mg by mouth daily.   atorvastatin 10 MG tablet Commonly known as: LIPITOR Take 1 tablet (10 mg total) by mouth daily.   BENEFIBER DRINK MIX PO Take by mouth 2 (two) times daily.   budesonide 0.25 MG/2ML nebulizer solution Commonly known as: Pulmicort Take 2 mLs (0.25 mg total) by nebulization in the morning and at bedtime.   Centrum Silver Chew Chew by mouth.   dextromethorphan-guaiFENesin 30-600 MG 12hr tablet Commonly known as: MUCINEX DM Take 1 tablet by mouth daily.   diclofenac sodium 1 % Gel Commonly known as: VOLTAREN Apply 2 g topically 4 (four)  times daily.   docusate sodium 100 MG capsule Commonly known as: COLACE Take 1-2 by mouth once a day.   famotidine 20 MG tablet Commonly known as: PEPCID Take 1 tablet (20 mg total) by mouth 2 (two) times daily.   glimepiride 2 MG tablet Commonly known as: AMARYL Take 1 tablet (2 mg total) by mouth daily with breakfast.   hydrALAZINE 25 MG tablet Commonly known as: APRESOLINE Take 1 tablet (25 mg total) by mouth 3 (three) times daily. What changed:  medication strength how much to take Changed by: Kathlene November, MD   hydrochlorothiazide 25 MG tablet Commonly known as: HYDRODIURIL TAKE 1/2 TABLET BY MOUTH EVERY DAY   ipratropium-albuterol 0.5-2.5 (3) MG/3ML Soln Commonly known as: DUONEB Take 3 mLs by nebulization every 6 (six) hours as needed.   Lidocaine 3 % Crea Apply 1 application  topically 3 (three) times daily as needed.   loratadine 10 MG tablet Commonly known as: CLARITIN Take 10 mg by mouth daily.   metoprolol succinate 100 MG 24 hr tablet Commonly known as: Toprol XL Take 1 tablet (100 mg total) by mouth daily. Take with or immediately following a meal.   NONFORMULARY OR COMPOUNDED ITEM Compression Stockings Dx: M79.89   pantoprazole 40 MG tablet Commonly known as: PROTONIX Take 1 tablet (40 mg total) by mouth every other day.   sitaGLIPtin 50 MG tablet Commonly known as: Januvia Take 1 tablet (50 mg total) by mouth daily.   valsartan 160 MG tablet Commonly known as: DIOVAN Take 1 tablet (160 mg total) by mouth daily.           Objective:   Physical Exam BP (!) 166/68 (BP Location: Left Arm, Patient Position: Sitting, Cuff Size: Normal)   Pulse 65   Temp 98 F (36.7 C) (Oral)   Resp 16   Ht 5\' 6"  (1.676 m)   Wt 202 lb 6 oz (91.8 kg)   SpO2 98%   BMI 32.66 kg/m  General:   Well developed, NAD, BMI noted. HEENT:  Normocephalic . Face symmetric, atraumatic Lungs:  Decreased breath sounds, no wheezing, very few rhonchi. Normal respiratory effort, no intercostal retractions, no accessory muscle use. Heart: RRR,  no murmur.  Lower extremities: Mild edema noted, DM foot exam: Pedal pulses present, pinprick examination: Slightly decreased distally Skin: Not pale. Not jaundice Neurologic:  alert & oriented X3.  Speech normal, gait appropriate for age and unassisted Psych--  Cognition and judgment appear intact.  Cooperative with normal attention span and concentration.  Behavior appropriate. No anxious or depressed appearing.      Assessment     ASSESSMENT DM--no neuropathy, Metformin DC 04-2015 d/t creatinine HTN  Hyperlipidemia Depression COPD - Asthma Pulmonary nodules per CXR 03-2014, CT 03-2016, stable, no further CTs needed Anemia, chronic, on-off iron def  Colonoscopy 2004: No polyps EGD 2006 for dysphagia showed  hiatal hernia, occult  stricture? Colonoscopy 2010 showed diverticuli Dysphagia: Barium swallow showed dysmotility 08-2014 DJD GERD OSA DX 05/2019  Macular degeneration currently L  Branch retinal vein occlusion  PLAN: HTN: Amlodipine was DC'd at the last visit, edema better, has lost 8 pounds on our scales.  BP not well controlled, in the 160s here at home. Plan:  Increase hydralazine to 25 mg 3 times daily Continue HCTZ, metoprolol, Diovan. Monitor BPs. Asthma COPD: See last visit, exacerbation was treated with antibiotics, she improved temporarily but now c/o sxs for few weeks:cough , whitish sputum , I do not think she is having  intercurrent URI.  Furthermore she is stopped inhalers 2 weeks ago. Plan: Brovana and Pulmicort BID encourage, Ventolin or DuoNeb as needed Nausea:recommend observation for now, call if severe DM neuropathy: Feet care discussed RTC 3 months    Time spent with patient patient 32 minutes, more than 50% counseling.  Explained the need to get better control of high blood pressure, the need to use Brovana and Pulmicort regularly, discussing feet care.   This visit occurred during the SARS-CoV-2 public health emergency.  Safety protocols were in place, including screening questions prior to the visit, additional usage of staff PPE, and extensive cleaning of exam room while observing appropriate contact time as indicated for disinfecting solutions.

## 2020-09-11 NOTE — Assessment & Plan Note (Signed)
HTN: Amlodipine was DC'd at the last visit, edema better, has lost 8 pounds on our scales.  BP not well controlled, in the 160s here at home. Plan:  Increase hydralazine to 25 mg 3 times daily Continue HCTZ, metoprolol, Diovan. Monitor BPs. Asthma COPD: See last visit, exacerbation was treated with antibiotics, she improved temporarily but now c/o sxs for few weeks:cough , whitish sputum , I do not think she is having intercurrent URI.  Furthermore she is stopped inhalers 2 weeks ago. Plan: Brovana and Pulmicort BID encourage, Ventolin or DuoNeb as needed Nausea:recommend observation for now, call if severe DM neuropathy: Feet care discussed RTC 3 months

## 2020-09-16 ENCOUNTER — Ambulatory Visit (INDEPENDENT_AMBULATORY_CARE_PROVIDER_SITE_OTHER): Payer: Medicare Other | Admitting: Ophthalmology

## 2020-09-16 ENCOUNTER — Other Ambulatory Visit: Payer: Self-pay

## 2020-09-16 ENCOUNTER — Encounter (INDEPENDENT_AMBULATORY_CARE_PROVIDER_SITE_OTHER): Payer: Self-pay | Admitting: Ophthalmology

## 2020-09-16 DIAGNOSIS — H353131 Nonexudative age-related macular degeneration, bilateral, early dry stage: Secondary | ICD-10-CM | POA: Diagnosis not present

## 2020-09-16 DIAGNOSIS — H34832 Tributary (branch) retinal vein occlusion, left eye, with macular edema: Secondary | ICD-10-CM | POA: Diagnosis not present

## 2020-09-16 MED ORDER — BEVACIZUMAB 2.5 MG/0.1ML IZ SOSY
2.5000 mg | PREFILLED_SYRINGE | INTRAVITREAL | Status: AC | PRN
  Administered 2020-09-16: 2.5 mg via INTRAVITREAL

## 2020-09-16 NOTE — Progress Notes (Signed)
09/16/2020     CHIEF COMPLAINT Patient presents for  Chief Complaint  Patient presents with   Retina Follow Up    6 week fu OS and Avastin OS Pt states, "I am pretty sure that my vision is worse than it was. It seems to be more blurred."       HISTORY OF PRESENT ILLNESS: Rebecca Elliott is a 87 y.o. female who presents to the clinic today for:   HPI     Retina Follow Up   Patient presents with  CRVO/BRVO.  In left eye.  This started 5 weeks ago.  Severity is mild.  Duration of 5 weeks.  Since onset it is gradually worsening. Additional comments: 6 week fu OS and Avastin OS Pt states, "I am pretty sure that my vision is worse than it was. It seems to be more blurred."         Comments   5 week fu os oct avastin os Patient states vision is stable and unchanged since last visit. Denies any new floaters or FOL.       Last edited by Laurin Coder, COA on 09/16/2020  2:17 PM.      Referring physician: Colon Branch, MD [address] [place],  [place] [address]  HISTORICAL INFORMATION:   Selected notes from the MEDICAL RECORD NUMBER    Lab Results  Component Value Date   HGBA1C 6.5 06/17/2020     CURRENT MEDICATIONS: No current outpatient medications on file. (Ophthalmic Drugs)   No current facility-administered medications for this visit. (Ophthalmic Drugs)   Current Outpatient Medications (Other)  Medication Sig   acetaminophen (TYLENOL) 500 MG tablet Take 500 mg by mouth as needed.   albuterol (VENTOLIN HFA) 108 (90 Base) MCG/ACT inhaler Inhale 2 puffs into the lungs every 6 (six) hours as needed for wheezing or shortness of breath.   arformoterol (BROVANA) 15 MCG/2ML NEBU Take 2 mLs (15 mcg total) by nebulization in the morning and at bedtime.   aspirin 81 MG tablet Take 81 mg by mouth daily.   atorvastatin (LIPITOR) 10 MG tablet Take 1 tablet (10 mg total) by mouth daily.   budesonide (PULMICORT) 0.25 MG/2ML nebulizer solution Take 2 mLs  (0.25 mg total) by nebulization in the morning and at bedtime.   dextromethorphan-guaiFENesin (MUCINEX DM) 30-600 MG per 12 hr tablet Take 1 tablet by mouth daily.   diclofenac sodium (VOLTAREN) 1 % GEL Apply 2 g topically 4 (four) times daily.   docusate sodium (COLACE) 100 MG capsule Take 1-2 by mouth once a day.   famotidine (PEPCID) 20 MG tablet Take 1 tablet (20 mg total) by mouth 2 (two) times daily.   glimepiride (AMARYL) 2 MG tablet Take 1 tablet (2 mg total) by mouth daily with breakfast.   hydrALAZINE (APRESOLINE) 25 MG tablet Take 1 tablet (25 mg total) by mouth 3 (three) times daily.   hydrochlorothiazide (HYDRODIURIL) 25 MG tablet TAKE 1/2 TABLET BY MOUTH EVERY DAY   ipratropium-albuterol (DUONEB) 0.5-2.5 (3) MG/3ML SOLN Take 3 mLs by nebulization every 6 (six) hours as needed.   Lidocaine 3 % CREA Apply 1 application topically 3 (three) times daily as needed.   loratadine (CLARITIN) 10 MG tablet Take 10 mg by mouth daily.   metoprolol succinate (TOPROL XL) 100 MG 24 hr tablet Take 1 tablet (100 mg total) by mouth daily. Take with or immediately following a meal.   Multiple Vitamins-Minerals (CENTRUM SILVER) CHEW Chew  by mouth.   NONFORMULARY OR COMPOUNDED ITEM Compression Stockings Dx: M79.89   pantoprazole (PROTONIX) 40 MG tablet Take 1 tablet (40 mg total) by mouth every other day.   sitaGLIPtin (JANUVIA) 50 MG tablet Take 1 tablet (50 mg total) by mouth daily.   valsartan (DIOVAN) 160 MG tablet Take 1 tablet (160 mg total) by mouth daily.   Wheat Dextrin (BENEFIBER DRINK MIX PO) Take by mouth 2 (two) times daily.   No current facility-administered medications for this visit. (Other)      REVIEW OF SYSTEMS:    ALLERGIES No Known Allergies  PAST MEDICAL HISTORY Past Medical History:  Diagnosis Date   Allergic rhinitis    COPD  and ASTHMA    Depression    Diabetes mellitus    as an adult   DJD (degenerative joint disease)    GERD (gastroesophageal reflux  disease)    and HH w/ reflux   History of colonic polyps    Hyperlipidemia    Hypertension    Past Surgical History:  Procedure Laterality Date   ABDOMINAL HYSTERECTOMY     apparently no oophorectomy   APPENDECTOMY     bilateral breast tumor     BREAST EXCISIONAL BIOPSY Bilateral    one in her 20's and one in her 69's, both benign   CATARACT EXTRACTION Bilateral    [place]   CATARACT EXTRACTION, BILATERAL     CHOLECYSTECTOMY     TONSILLECTOMY     TUBAL LIGATION      FAMILY HISTORY Family History  Problem Relation Age of Onset   Breast cancer Sister        x 2   Diabetes Father    Diabetes Sister    Heart attack Brother        early 43s   Heart attack Son    Pancreatic cancer Son    Stomach cancer Sister    Colon cancer Neg Hx     SOCIAL HISTORY Social History   Tobacco Use   Smoking status: Former    Packs/day: 1.00    Years: 35.00    Pack years: 35.00    Types: Cigarettes    Quit date: 01/18/1993    Years since quitting: 27.6   Smokeless tobacco: Never  Substance Use Topics   Alcohol use: No    Alcohol/week: 0.0 standard drinks   Drug use: No         OPHTHALMIC EXAM:  Base Eye Exam     Visual Acuity (ETDRS)       Right Left   Dist cc 20/30 -2 20/100 -1   Dist ph cc  NI    Correction: Glasses         Tonometry (Tonopen, 2:20 PM)       Right Left   Pressure 12 12         Pupils       Pupils Dark Light React APD   Right PERRL 4 4 Minimal None   Left PERRL 4 4 Minimal None         Extraocular Movement       Right Left    Full, Ortho Full, Ortho         Neuro/Psych     Oriented x3: Yes   Mood/Affect: Normal         Dilation     Left eye: 1.0% Mydriacyl, 2.5% Phenylephrine @ 2:20 PM           Slit Lamp and  Fundus Exam     External Exam       Right Left   External Normal Normal         Slit Lamp Exam       Right Left   Lids/Lashes Normal Normal   Conjunctiva/Sclera White and quiet White and quiet    Cornea Clear Clear   Anterior Chamber Deep and quiet Deep and quiet   Iris Round and reactive Round and reactive   Lens Posterior chamber intraocular lens Posterior chamber intraocular lens, Centered posterior chamber intraocular lens   Anterior Vitreous Normal Normal         Fundus Exam       Right Left   Posterior Vitreous  Posterior vitreous detachment   Disc  Normal   C/D Ratio  0.1   Macula  Hard drusen, Intraretinal hemorrhage, Macular thickening, Retinal pigment epithelial mottling, Early age related macular degeneration, Microaneurysm large, no cystoid macular edema   Vessels  AV nicking, Tortuous, and 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 Scan locations included subfoveal. Central Foveal Thickness: 265. Progression has been stable. Findings include abnormal foveal contour, retinal drusen , no IRF, no SRF.   Left Eye Quality was good. Scan locations included subfoveal. Central Foveal Thickness: 313. Progression has improved. Findings include no SRF, retinal drusen , cystoid macular edema.   Notes At 5-week interval.  Massive CME has improved from BRVO.  Repeat injection today and examination next in 5 weeks     Intravitreal Injection, Pharmacologic Agent - OS - Left Eye       Time Out 09/16/2020. 2:39 PM. Confirmed correct patient, procedure, site, and patient consented.   Anesthesia Topical anesthesia was used. Anesthetic medications included Akten 3.5%.   Procedure Preparation included Tobramycin 0.3%, 10% betadine to eyelids, 5% betadine to ocular surface. A 30 gauge needle was used.   Injection: 2.5 mg bevacizumab 2.5 MG/0.1ML   Route: Intravitreal, Site: Left Eye   NDC: [phone], Lot: [id number]   Post-op Post  injection exam found visual acuity of at least counting fingers. The patient tolerated the procedure well. There were no complications. The patient received written and verbal post procedure care education. Post injection medications were not given.              ASSESSMENT/PLAN:  Branch retinal vein occlusion with macular edema of left eye OS vastly improved CME at 5-week interval post Avastin.  We will repeat again today the injection and follow-up again in 5 weeks to maintain  Early stage nonexudative age-related macular degeneration of both eyes No signs of CNVM on OCT     ICD-10-CM   1. Branch retinal vein occlusion with macular edema of left eye  H34.8320 OCT, Retina - OU - Both Eyes    Intravitreal Injection, Pharmacologic Agent - OS - Left Eye    bevacizumab (AVASTIN) SOSY 2.5 mg    2. Early stage nonexudative age-related macular degeneration of both eyes  H35.3131       1.  Center involved CME OS secondary to BRVO vastly improved today at 5-week follow-up interval.  We will repeat injection today and follow-up again in 5 weeks OS  2.  3.  Ophthalmic Meds Ordered this visit:  Meds ordered this encounter  Medications   bevacizumab (AVASTIN) SOSY 2.5 mg       Return in about 5 weeks (around 10/21/2020) for dilate, OS, AVASTIN OCT.  There are no Patient Instructions on file for this visit.   Explained the diagnoses, plan, and follow up with the patient and they expressed understanding.  Patient expressed understanding of the importance of proper follow up care.   Clent Demark Nohlan Burdin M.D. Diseases & Surgery of the Retina and Vitreous [facility] [place] 09/16/20     Abbreviations: M myopia (nearsighted); A astigmatism; H hyperopia (farsighted); P presbyopia; Mrx spectacle prescription;  CTL contact lenses; OD right eye; OS left eye; OU both eyes  XT exotropia; ET esotropia; PEK punctate epithelial keratitis; PEE punctate epithelial erosions; DES dry eye  syndrome; MGD meibomian gland dysfunction; ATs artificial tears; PFAT's preservative free artificial tears; [place] nuclear sclerotic cataract; PSC posterior subcapsular cataract; ERM epi-retinal membrane; PVD posterior vitreous detachment; RD retinal detachment; DM diabetes mellitus; DR diabetic retinopathy; NPDR non-proliferative diabetic retinopathy; PDR proliferative diabetic retinopathy; CSME clinically significant macular edema; DME diabetic macular edema; dbh dot blot hemorrhages; CWS cotton wool spot; POAG primary open angle glaucoma; C/D cup-to-disc ratio; HVF humphrey visual field; GVF goldmann visual field; OCT optical coherence tomography; IOP intraocular pressure; BRVO Branch retinal vein occlusion; CRVO central retinal vein occlusion; CRAO central retinal artery occlusion; BRAO branch retinal artery occlusion; RT retinal tear; SB scleral buckle; PPV pars plana vitrectomy; VH Vitreous hemorrhage; PRP panretinal laser photocoagulation; IVK intravitreal kenalog; VMT vitreomacular traction; MH Macular hole;  NVD neovascularization of the disc; NVE neovascularization elsewhere; AREDS age related eye disease study; ARMD age related macular degeneration; POAG primary open angle glaucoma; EBMD epithelial/anterior basement membrane dystrophy; ACIOL anterior chamber intraocular lens; IOL intraocular lens; PCIOL posterior chamber intraocular lens; Phaco/IOL phacoemulsification with intraocular lens placement; [place] photorefractive keratectomy; LASIK laser assisted in situ keratomileusis; HTN hypertension; DM diabetes mellitus; COPD chronic obstructive pulmonary disease

## 2020-09-16 NOTE — Assessment & Plan Note (Signed)
OS vastly improved CME at 5-week interval post Avastin.  We will repeat again today the injection and follow-up again in 5 weeks to maintain

## 2020-09-16 NOTE — Assessment & Plan Note (Signed)
No signs of CNVM on OCT

## 2020-09-17 ENCOUNTER — Ambulatory Visit: Payer: Medicare Other | Admitting: Internal Medicine

## 2020-10-05 ENCOUNTER — Other Ambulatory Visit: Payer: Self-pay | Admitting: Internal Medicine

## 2020-10-12 ENCOUNTER — Other Ambulatory Visit: Payer: Self-pay | Admitting: Internal Medicine

## 2020-10-16 ENCOUNTER — Encounter (INDEPENDENT_AMBULATORY_CARE_PROVIDER_SITE_OTHER): Payer: Medicare Other | Admitting: Ophthalmology

## 2020-11-03 ENCOUNTER — Encounter (INDEPENDENT_AMBULATORY_CARE_PROVIDER_SITE_OTHER): Payer: Medicare Other | Admitting: Ophthalmology

## 2020-11-05 ENCOUNTER — Emergency Department (HOSPITAL_BASED_OUTPATIENT_CLINIC_OR_DEPARTMENT_OTHER): Payer: Medicare Other

## 2020-11-05 ENCOUNTER — Inpatient Hospital Stay (HOSPITAL_BASED_OUTPATIENT_CLINIC_OR_DEPARTMENT_OTHER)
Admission: EM | Admit: 2020-11-05 | Discharge: 2020-11-12 | DRG: 193 | Disposition: A | Discharge status: Home Health | Payer: Medicare Other | Attending: Internal Medicine | Admitting: Internal Medicine

## 2020-11-05 ENCOUNTER — Encounter (HOSPITAL_BASED_OUTPATIENT_CLINIC_OR_DEPARTMENT_OTHER): Payer: Self-pay | Admitting: Emergency Medicine

## 2020-11-05 ENCOUNTER — Other Ambulatory Visit: Payer: Self-pay

## 2020-11-05 DIAGNOSIS — R5381 Other malaise: Secondary | ICD-10-CM | POA: Diagnosis present

## 2020-11-05 DIAGNOSIS — Z7982 Long term (current) use of aspirin: Secondary | ICD-10-CM | POA: Diagnosis not present

## 2020-11-05 DIAGNOSIS — E785 Hyperlipidemia, unspecified: Secondary | ICD-10-CM | POA: Diagnosis present

## 2020-11-05 DIAGNOSIS — Z862 Personal history of diseases of the blood and blood-forming organs and certain disorders involving the immune mechanism: Secondary | ICD-10-CM | POA: Diagnosis not present

## 2020-11-05 DIAGNOSIS — I130 Hypertensive heart and chronic kidney disease with heart failure and stage 1 through stage 4 chronic kidney disease, or unspecified chronic kidney disease: Secondary | ICD-10-CM | POA: Diagnosis present

## 2020-11-05 DIAGNOSIS — J189 Pneumonia, unspecified organism: Principal | ICD-10-CM | POA: Diagnosis present

## 2020-11-05 DIAGNOSIS — D631 Anemia in chronic kidney disease: Secondary | ICD-10-CM | POA: Diagnosis present

## 2020-11-05 DIAGNOSIS — J9601 Acute respiratory failure with hypoxia: Secondary | ICD-10-CM | POA: Diagnosis not present

## 2020-11-05 DIAGNOSIS — Z79899 Other long term (current) drug therapy: Secondary | ICD-10-CM

## 2020-11-05 DIAGNOSIS — N183 Chronic kidney disease, stage 3 unspecified: Secondary | ICD-10-CM | POA: Diagnosis present

## 2020-11-05 DIAGNOSIS — Z7984 Long term (current) use of oral hypoglycemic drugs: Secondary | ICD-10-CM

## 2020-11-05 DIAGNOSIS — J811 Chronic pulmonary edema: Secondary | ICD-10-CM | POA: Diagnosis not present

## 2020-11-05 DIAGNOSIS — K5730 Diverticulosis of large intestine without perforation or abscess without bleeding: Secondary | ICD-10-CM | POA: Diagnosis not present

## 2020-11-05 DIAGNOSIS — E1122 Type 2 diabetes mellitus with diabetic chronic kidney disease: Secondary | ICD-10-CM | POA: Diagnosis present

## 2020-11-05 DIAGNOSIS — Z803 Family history of malignant neoplasm of breast: Secondary | ICD-10-CM

## 2020-11-05 DIAGNOSIS — J9621 Acute and chronic respiratory failure with hypoxia: Secondary | ICD-10-CM | POA: Diagnosis present

## 2020-11-05 DIAGNOSIS — N189 Chronic kidney disease, unspecified: Secondary | ICD-10-CM

## 2020-11-05 DIAGNOSIS — E1140 Type 2 diabetes mellitus with diabetic neuropathy, unspecified: Secondary | ICD-10-CM | POA: Diagnosis present

## 2020-11-05 DIAGNOSIS — Z8249 Family history of ischemic heart disease and other diseases of the circulatory system: Secondary | ICD-10-CM

## 2020-11-05 DIAGNOSIS — J440 Chronic obstructive pulmonary disease with acute lower respiratory infection: Secondary | ICD-10-CM | POA: Diagnosis present

## 2020-11-05 DIAGNOSIS — Z90710 Acquired absence of both cervix and uterus: Secondary | ICD-10-CM | POA: Diagnosis not present

## 2020-11-05 DIAGNOSIS — R197 Diarrhea, unspecified: Secondary | ICD-10-CM | POA: Diagnosis present

## 2020-11-05 DIAGNOSIS — Z87891 Personal history of nicotine dependence: Secondary | ICD-10-CM | POA: Diagnosis not present

## 2020-11-05 DIAGNOSIS — Z7951 Long term (current) use of inhaled steroids: Secondary | ICD-10-CM

## 2020-11-05 DIAGNOSIS — J441 Chronic obstructive pulmonary disease with (acute) exacerbation: Secondary | ICD-10-CM | POA: Diagnosis present

## 2020-11-05 DIAGNOSIS — F32A Depression, unspecified: Secondary | ICD-10-CM | POA: Diagnosis present

## 2020-11-05 DIAGNOSIS — E669 Obesity, unspecified: Secondary | ICD-10-CM | POA: Diagnosis present

## 2020-11-05 DIAGNOSIS — Z9049 Acquired absence of other specified parts of digestive tract: Secondary | ICD-10-CM

## 2020-11-05 DIAGNOSIS — Z20822 Contact with and (suspected) exposure to covid-19: Secondary | ICD-10-CM | POA: Diagnosis present

## 2020-11-05 DIAGNOSIS — N281 Cyst of kidney, acquired: Secondary | ICD-10-CM | POA: Diagnosis not present

## 2020-11-05 DIAGNOSIS — I5033 Acute on chronic diastolic (congestive) heart failure: Secondary | ICD-10-CM | POA: Diagnosis present

## 2020-11-05 DIAGNOSIS — R0602 Shortness of breath: Secondary | ICD-10-CM

## 2020-11-05 DIAGNOSIS — K219 Gastro-esophageal reflux disease without esophagitis: Secondary | ICD-10-CM | POA: Diagnosis present

## 2020-11-05 DIAGNOSIS — I10 Essential (primary) hypertension: Secondary | ICD-10-CM | POA: Diagnosis not present

## 2020-11-05 DIAGNOSIS — J90 Pleural effusion, not elsewhere classified: Secondary | ICD-10-CM | POA: Diagnosis not present

## 2020-11-05 DIAGNOSIS — Z833 Family history of diabetes mellitus: Secondary | ICD-10-CM

## 2020-11-05 DIAGNOSIS — I509 Heart failure, unspecified: Secondary | ICD-10-CM | POA: Diagnosis present

## 2020-11-05 DIAGNOSIS — E876 Hypokalemia: Secondary | ICD-10-CM | POA: Diagnosis present

## 2020-11-05 DIAGNOSIS — I517 Cardiomegaly: Secondary | ICD-10-CM | POA: Diagnosis not present

## 2020-11-05 DIAGNOSIS — Z800 Family history of malignant neoplasm of digestive organs: Secondary | ICD-10-CM

## 2020-11-05 DIAGNOSIS — N1832 Chronic kidney disease, stage 3b: Secondary | ICD-10-CM | POA: Diagnosis not present

## 2020-11-05 DIAGNOSIS — I2510 Atherosclerotic heart disease of native coronary artery without angina pectoris: Secondary | ICD-10-CM | POA: Diagnosis present

## 2020-11-05 LAB — CBC WITH DIFFERENTIAL/PLATELET
Abs Immature Granulocytes: 0.03 K/uL (ref 0.00–0.07)
Basophils Absolute: 0.1 K/uL (ref 0.0–0.1)
Basophils Relative: 1 %
Eosinophils Absolute: 0 K/uL (ref 0.0–0.5)
Eosinophils Relative: 0 %
HCT: 37 % (ref 36.0–46.0)
Hemoglobin: 11.5 g/dL — ABNORMAL LOW (ref 12.0–15.0)
Immature Granulocytes: 0 %
Lymphocytes Relative: 25 %
Lymphs Abs: 2.4 K/uL (ref 0.7–4.0)
MCH: 25.9 pg — ABNORMAL LOW (ref 26.0–34.0)
MCHC: 31.1 g/dL (ref 30.0–36.0)
MCV: 83.3 fL (ref 80.0–100.0)
Monocytes Absolute: 0.7 K/uL (ref 0.1–1.0)
Monocytes Relative: 7 %
Neutro Abs: 6.4 K/uL (ref 1.7–7.7)
Neutrophils Relative %: 67 %
Platelets: 281 K/uL (ref 150–400)
RBC: 4.44 MIL/uL (ref 3.87–5.11)
RDW: 16.9 % — ABNORMAL HIGH (ref 11.5–15.5)
WBC: 9.7 K/uL (ref 4.0–10.5)
nRBC: 0 % (ref 0.0–0.2)

## 2020-11-05 LAB — COMPREHENSIVE METABOLIC PANEL WITH GFR
ALT: 13 U/L (ref 0–44)
AST: 25 U/L (ref 15–41)
Albumin: 3.3 g/dL — ABNORMAL LOW (ref 3.5–5.0)
Alkaline Phosphatase: 48 U/L (ref 38–126)
Anion gap: 11 (ref 5–15)
BUN: 19 mg/dL (ref 8–23)
CO2: 24 mmol/L (ref 22–32)
Calcium: 8.8 mg/dL — ABNORMAL LOW (ref 8.9–10.3)
Chloride: 101 mmol/L (ref 98–111)
Creatinine, Ser: 1.4 mg/dL — ABNORMAL HIGH (ref 0.44–1.00)
GFR, Estimated: 35 mL/min — ABNORMAL LOW
Glucose, Bld: 112 mg/dL — ABNORMAL HIGH (ref 70–99)
Potassium: 3.2 mmol/L — ABNORMAL LOW (ref 3.5–5.1)
Sodium: 136 mmol/L (ref 135–145)
Total Bilirubin: 0.8 mg/dL (ref 0.3–1.2)
Total Protein: 7.9 g/dL (ref 6.5–8.1)

## 2020-11-05 LAB — RESP PANEL BY RT-PCR (FLU A&B, COVID) ARPGX2
Influenza A by PCR: NEGATIVE
Influenza B by PCR: NEGATIVE
SARS Coronavirus 2 by RT PCR: NEGATIVE

## 2020-11-05 LAB — MAGNESIUM: Magnesium: 1.6 mg/dL — ABNORMAL LOW (ref 1.7–2.4)

## 2020-11-05 LAB — GLUCOSE, CAPILLARY: Glucose-Capillary: 99 mg/dL (ref 70–99)

## 2020-11-05 LAB — LIPASE, BLOOD: Lipase: 40 U/L (ref 11–51)

## 2020-11-05 MED ORDER — LACTATED RINGERS IV SOLN: INTRAVENOUS | Status: AC

## 2020-11-05 MED ORDER — ALBUTEROL SULFATE (2.5 MG/3ML) 0.083% IN NEBU: 2.5000 mg | INHALATION_SOLUTION | RESPIRATORY_TRACT | Status: DC | PRN

## 2020-11-05 MED ORDER — LORATADINE 10 MG PO TABS: 10.0000 mg | ORAL_TABLET | Freq: Every day | ORAL | Status: DC

## 2020-11-05 MED ORDER — SODIUM CHLORIDE 0.9 % IV SOLN
500.0000 mg | Freq: Once | INTRAVENOUS | Status: AC
  Administered 2020-11-05: 500 mg via INTRAVENOUS
  Filled 2020-11-05: qty 500

## 2020-11-05 MED ORDER — IPRATROPIUM-ALBUTEROL 0.5-2.5 (3) MG/3ML IN SOLN
3.0000 mL | Freq: Four times a day (QID) | RESPIRATORY_TRACT | Status: DC
  Administered 2020-11-06 (×4): 3 mL via RESPIRATORY_TRACT
  Filled 2020-11-05 (×4): qty 3

## 2020-11-05 MED ORDER — INSULIN ASPART 100 UNIT/ML IJ SOLN
0.0000 [IU] | Freq: Three times a day (TID) | INTRAMUSCULAR | Status: DC
  Administered 2020-11-06: 3 [IU] via SUBCUTANEOUS
  Administered 2020-11-06 – 2020-11-07 (×3): 2 [IU] via SUBCUTANEOUS
  Administered 2020-11-07: 3 [IU] via SUBCUTANEOUS
  Administered 2020-11-07: 2 [IU] via SUBCUTANEOUS
  Administered 2020-11-08: 5 [IU] via SUBCUTANEOUS
  Administered 2020-11-08: 2 [IU] via SUBCUTANEOUS
  Administered 2020-11-09: 3 [IU] via SUBCUTANEOUS
  Administered 2020-11-09: 2 [IU] via SUBCUTANEOUS
  Administered 2020-11-09: 3 [IU] via SUBCUTANEOUS
  Administered 2020-11-10: 5 [IU] via SUBCUTANEOUS
  Administered 2020-11-10: 3 [IU] via SUBCUTANEOUS
  Administered 2020-11-11: 1 [IU] via SUBCUTANEOUS
  Administered 2020-11-11: 2 [IU] via SUBCUTANEOUS
  Administered 2020-11-11: 5 [IU] via SUBCUTANEOUS
  Administered 2020-11-12: 1 [IU] via SUBCUTANEOUS
  Administered 2020-11-12: 3 [IU] via SUBCUTANEOUS

## 2020-11-05 MED ORDER — ASPIRIN 81 MG PO TABS: 81.0000 mg | ORAL_TABLET | Freq: Every day | ORAL | Status: DC

## 2020-11-05 MED ORDER — METOPROLOL SUCCINATE ER 100 MG PO TB24
100.0000 mg | ORAL_TABLET | Freq: Every day | ORAL | Status: DC
  Administered 2020-11-06 – 2020-11-12 (×6): 100 mg via ORAL
  Filled 2020-11-05 (×7): qty 1

## 2020-11-05 MED ORDER — ALBUTEROL SULFATE HFA 108 (90 BASE) MCG/ACT IN AERS
INHALATION_SPRAY | RESPIRATORY_TRACT | Status: AC
  Administered 2020-11-05: 4
  Filled 2020-11-05: qty 6.7

## 2020-11-05 MED ORDER — ACETAMINOPHEN 650 MG RE SUPP: 650.0000 mg | Freq: Four times a day (QID) | RECTAL | Status: DC | PRN

## 2020-11-05 MED ORDER — SODIUM CHLORIDE 0.9 % IV BOLUS
1000.0000 mL | Freq: Once | INTRAVENOUS | Status: AC
  Administered 2020-11-05: 1000 mL via INTRAVENOUS

## 2020-11-05 MED ORDER — IRBESARTAN 150 MG PO TABS
150.0000 mg | ORAL_TABLET | Freq: Every day | ORAL | Status: DC
  Administered 2020-11-06 – 2020-11-12 (×7): 150 mg via ORAL
  Filled 2020-11-05 (×7): qty 1

## 2020-11-05 MED ORDER — POTASSIUM CHLORIDE CRYS ER 20 MEQ PO TBCR
40.0000 meq | EXTENDED_RELEASE_TABLET | Freq: Once | ORAL | Status: AC
  Administered 2020-11-05: 40 meq via ORAL
  Filled 2020-11-05: qty 2

## 2020-11-05 MED ORDER — FENTANYL CITRATE PF 50 MCG/ML IJ SOSY: 25.0000 ug | PREFILLED_SYRINGE | INTRAMUSCULAR | Status: DC | PRN

## 2020-11-05 MED ORDER — SODIUM CHLORIDE 0.9 % IV SOLN
500.0000 mg | INTRAVENOUS | Status: DC
  Filled 2020-11-05: qty 500

## 2020-11-05 MED ORDER — LORATADINE 10 MG PO TABS
5.0000 mg | ORAL_TABLET | Freq: Every day | ORAL | Status: DC
  Administered 2020-11-06 – 2020-11-12 (×7): 5 mg via ORAL
  Filled 2020-11-05 (×7): qty 1

## 2020-11-05 MED ORDER — HYDRALAZINE HCL 20 MG/ML IJ SOLN
10.0000 mg | INTRAMUSCULAR | Status: DC | PRN
  Administered 2020-11-08: 10 mg via INTRAVENOUS
  Filled 2020-11-05: qty 1

## 2020-11-05 MED ORDER — SODIUM CHLORIDE 0.9 % IV SOLN
1.0000 g | Freq: Once | INTRAVENOUS | Status: AC
  Administered 2020-11-05: 1 g via INTRAVENOUS
  Filled 2020-11-05: qty 10

## 2020-11-05 MED ORDER — ACETAMINOPHEN 325 MG PO TABS
650.0000 mg | ORAL_TABLET | Freq: Four times a day (QID) | ORAL | Status: DC | PRN
  Administered 2020-11-10: 650 mg via ORAL
  Filled 2020-11-05: qty 2

## 2020-11-05 MED ORDER — ONDANSETRON HCL 4 MG/2ML IJ SOLN
4.0000 mg | Freq: Once | INTRAMUSCULAR | Status: AC
  Administered 2020-11-05: 4 mg via INTRAVENOUS
  Filled 2020-11-05: qty 2

## 2020-11-05 MED ORDER — HYDRALAZINE HCL 25 MG PO TABS
25.0000 mg | ORAL_TABLET | Freq: Three times a day (TID) | ORAL | Status: DC
  Administered 2020-11-05 – 2020-11-12 (×21): 25 mg via ORAL
  Filled 2020-11-05 (×21): qty 1

## 2020-11-05 MED ORDER — MAGNESIUM SULFATE 2 GM/50ML IV SOLN
2.0000 g | Freq: Once | INTRAVENOUS | Status: AC
  Administered 2020-11-05: 2 g via INTRAVENOUS
  Filled 2020-11-05: qty 50

## 2020-11-05 MED ORDER — MORPHINE SULFATE (PF) 2 MG/ML IV SOLN
2.0000 mg | Freq: Once | INTRAVENOUS | Status: AC
  Administered 2020-11-05: 2 mg via INTRAVENOUS
  Filled 2020-11-05: qty 1

## 2020-11-05 MED ORDER — METHYLPREDNISOLONE SODIUM SUCC 125 MG IJ SOLR
80.0000 mg | Freq: Two times a day (BID) | INTRAMUSCULAR | Status: DC
  Administered 2020-11-05 – 2020-11-08 (×6): 80 mg via INTRAVENOUS
  Filled 2020-11-05 (×6): qty 2

## 2020-11-05 MED ORDER — SODIUM CHLORIDE 0.9 % IV SOLN
1.0000 g | INTRAVENOUS | Status: DC
  Administered 2020-11-06 – 2020-11-08 (×3): 1 g via INTRAVENOUS
  Filled 2020-11-05 (×4): qty 10

## 2020-11-05 NOTE — H&P (Signed)
History and Physical    PLEASE NOTE THAT DRAGON DICT[employer] SOFTWARE WAS USED IN THE CONSTRUCTION OF THIS NOTE.   Rebecca Elliott:[id number] DOB: 14-Mar-1926 DOA: 11/05/2020  PCP: Colon Branch, MD Patient coming from: home   I have personally briefly reviewed patient's old medical records in [place]  Chief Complaint: Shortness of breath  HPI: Rebecca Elliott is a 87 y.o. female with medical history significant for type 2 diabetes mellitus, hypertension, hyperlipidemia, COPD, GERD, stage IIIb chronic kidney disease with baseline creatinine 1.4-1.5, chronic anemia with baseline hemoglobin 10-13, who is admitted to [facility] on 11/05/2020 by way of transfer from [place] ED with kidney acquired pneumonia after presenting from home to the latter facility complaining of shortness of breath.   The patient reports 5 to 6 days of progressive shortness of breath associated with mild worsening of her chronic nonproductive cough.  Not associate with any orthopnea, PND, or new onset peripheral edema.  No recent chest pain, diaphoresis, palpitations, presyncope, or syncope.  Denies any associated wheezing, hemoptysis, new lower extremity erythema, or calf tenderness. Denies any recent trauma, travel, surgical procedures, or periods of prolonged diminished ambulatory status.   Denies any associated subjective fever, chills, rigors, or generalized myalgias. No recent headache, neck stiffness, rhinitis, rhinorrhea, sore throat, rash. No known recent COVID-19 exposures. Denies dysuria, gross hematuria, or change in urinary urgency/frequency.    She notes associated new onset loose stool over the course of the last week, noting 2-3 episodes per day over that time of watery, nonbloody stool.  Denies any associated melena or hematochezia.  She reports associated crampy abdominal discomfort in the bilateral lower abdominal quadrants.  She notes that this is intermittent in nature, and  worsens with palpation over the bilateral lower abdominal quadrants.  Denies any preceding trauma.  She also notes a decline in her appetite over the last 4 to 5 days, resulting in decline in oral intake of both food and fluid over that timeframe.  She confirms underlying history of COPD in the setting of being former smoker, having smoked 1 pack/day for 35 to 40 years, before completely quitting smoking in the mid 1990s.  Denies any known baseline supplemental oxygen requirements.  Per chart review, no initial evidence of any prior echocardiogram results.     [facility] ED Course:  Vital signs in the ED were notable for the following: Afebrile; heart rate 70-85; blood pressure 155/58 -179/65; respiratory rate 18-27; initial oxygen saturation 84% on room air, which increased to 94% on 2 L nasal cannula.  Labs were notable for the following: CMP notable for the following: Potassium 3.2, bicarbonate 24, creatinine 1.40 relative to most recent prior value of 149 on 08/06/2020, glucose 112, albumin 3.3, otherwise liver enzymes were found to be within normal limits.  CBC notable for white cell count 9700, hemoglobin 11.7 relative to most recent prior value of 10.5 in July 2022.  Nasopharyngeal COVID-19/influenza PCR checked at [place] emergency department today found to be negative.  Blood cultures x2 collected prior to initiation of IV antibiotics.  Imaging and additional notable ED work-up: EKG shows sinus rhythm with heart rate 72, QTc 496, and no evidence of T wave or ST changes, including no evidence of ST elevation.  Chest x-ray was suggestive of bilateral lower lobe infiltrates concerning for pneumonia, with potential bilateral pleural effusions, in the absence of any overt evidence of pulmonary edema or pneumothorax.  CT abdomen/pelvis without contrast showed no evidence of acute intra-abdominal process, while showing diverticulosis in the absence of evidence of diverticulitis,  will also demonstrating evidence of bilateral lower lobe infiltrates, with right pleural effusion.  While in the ED, the following were administered: Albuterol inhaler x 2 puffs, Zofran 4 mg IV x1, potassium chloride 40 mill colons p.o. x1, azithromycin, Rocephin, magnesium sulfate 2 g IV every 2 hours x1 dose, normal saline x1 L bolus.  Subsequently, the patient was admitted to the med telemetry unit at [facility] for further evaluation and management of suspected community-acquired pneumonia.     Review of Systems: As per HPI otherwise 10 point review of systems negative.   Past Medical History:  Diagnosis Date   Allergic rhinitis    COPD  and ASTHMA    Depression    Diabetes mellitus    as an adult   DJD (degenerative joint disease)    GERD (gastroesophageal reflux disease)    and HH w/ reflux   History of colonic polyps    Hyperlipidemia    Hypertension     Past Surgical History:  Procedure Laterality Date   ABDOMINAL HYSTERECTOMY     apparently no oophorectomy   APPENDECTOMY     bilateral breast tumor     BREAST EXCISIONAL BIOPSY Bilateral    one in her 20's and one in her 73's, both benign   CATARACT EXTRACTION Bilateral    [place]   CATARACT EXTRACTION, BILATERAL     CHOLECYSTECTOMY     TONSILLECTOMY     TUBAL LIGATION      Social History:  reports that she quit smoking about 27 years ago. Her smoking use included cigarettes. She has a 35.00 pack-year smoking history. She has never used smokeless tobacco. She reports that she does not drink alcohol and does not use drugs.   No Known Allergies  Family History  Problem Relation Age of Onset   Breast cancer Sister        x 2   Diabetes Father    Diabetes Sister    Heart attack Brother        early 64s   Heart attack Son    Pancreatic cancer Son    Stomach cancer Sister    Colon cancer Neg Hx     Family history reviewed and not pertinent    Prior to Admission medications   Medication Sig Start Date  End Date Taking? Authorizing Provider  budesonide (PULMICORT) 0.25 MG/2ML nebulizer solution TAKE 2 ML VIA NEBULIZATION IN THE MORNING AND AT BEDTIME 10/13/20   Colon Branch, MD  acetaminophen (TYLENOL) 500 MG tablet Take 500 mg by mouth as needed.    [provider]  albuterol (VENTOLIN HFA) 108 (90 Base) MCG/ACT inhaler Inhale 2 puffs into the lungs every 6 (six) hours as needed for wheezing or shortness of breath. 03/04/20   Colon Branch, MD  arformoterol (BROVANA) 15 MCG/2ML NEBU Take 2 mLs (15 mcg total) by nebulization in the morning and at bedtime. 04/10/20   Colon Branch, MD  aspirin 81 MG tablet Take 81 mg by mouth daily.    [provider]  atorvastatin (LIPITOR) 10 MG tablet Take 1 tablet (10 mg total) by mouth daily. 06/25/20   Colon Branch, MD  dextromethorphan-guaiFENesin [facility] DM) 30-600 MG per 12 hr tablet Take 1 tablet by mouth daily.    [provider]  diclofenac sodium (VOLTAREN) 1 % GEL Apply 2 g topically  4 (four) times daily. 07/27/18   Colon Branch, MD  docusate sodium (COLACE) 100 MG capsule Take 1-2 by mouth once a day.    [provider]  famotidine (PEPCID) 20 MG tablet Take 1 tablet (20 mg total) by mouth 2 (two) times daily. 12/05/19   Colon Branch, MD  glimepiride (AMARYL) 2 MG tablet Take 1 tablet (2 mg total) by mouth daily with breakfast. 06/25/20   Colon Branch, MD  hydrALAZINE (APRESOLINE) 25 MG tablet Take 1 tablet (25 mg total) by mouth 3 (three) times daily. 09/10/20   Colon Branch, MD  hydrochlorothiazide (HYDRODIURIL) 25 MG tablet TAKE 1/2 TABLET BY MOUTH EVERY DAY 09/05/20   Colon Branch, MD  ipratropium-albuterol (DUONEB) 0.5-2.5 (3) MG/3ML SOLN Take 3 mLs by nebulization every 6 (six) hours as needed. 01/22/20   Colon Branch, MD  Lidocaine 3 % CREA Apply 1 application topically 3 (three) times daily as needed. 07/27/18   Colon Branch, MD  loratadine (CLARITIN) 10 MG tablet Take 10 mg by mouth daily.    [provider]  metoprolol  succinate (TOPROL XL) 100 MG 24 hr tablet Take 1 tablet (100 mg total) by mouth daily. Take with or immediately following a meal. 06/17/20   Colon Branch, MD  Multiple Vitamins-Minerals (CENTRUM SILVER) CHEW Chew by mouth.    [provider]  NONFORMULARY OR COMPOUNDED ITEM Compression Stockings Dx: H47.42 04/13/19   Colon Branch, MD  pantoprazole (PROTONIX) 40 MG tablet Take 1 tablet (40 mg total) by mouth every other day. 12/05/19   Colon Branch, MD  sitaGLIPtin (JANUVIA) 50 MG tablet Take 1 tablet (50 mg total) by mouth daily. 06/25/20   Colon Branch, MD  valsartan (DIOVAN) 160 MG tablet TAKE 1 TABLET BY MOUTH EVERY DAY 10/06/20   Colon Branch, MD  Wheat Dextrin (BENEFIBER DRINK MIX PO) Take by mouth 2 (two) times daily.    [provider]     Objective    Physical Exam: Vitals:   11/05/20 1830 11/05/20 1900 11/05/20 1930 11/05/20 2038  BP: (!) 173/64 (!) 182/68 (!) 174/65 (!) 191/61  Pulse: 69 72 74 82  Resp: (!) 22 (!) 24 (!) 25 20  Temp:    98.5 F (36.9 C)  TempSrc:    Oral  SpO2: 91% 95% 92% 100%  Weight:      Height:        General: appears to be stated age; alert, oriented; mildly increased wob noted. Skin: warm, dry, no rash Head:  AT/[place] Mouth:  Oral mucosa membranes appear dry, normal dentition Neck: supple; trachea midline Heart:  RRR; did not appreciate any M/R/G Lungs: Bibasilar rales noted, but otherwise, CTAB, did not appreciate any wheezes,  or rhonchi Abdomen: + BS; soft, ND, NT Vascular: 2+ pedal pulses b/l; 2+ radial pulses b/l Extremities: no peripheral edema, no muscle wasting Neuro: strength and sensation intact in upper and lower extremities b/l   Labs on Admission: I have personally reviewed following labs and imaging studies  CBC: Recent Labs  Lab 11/05/20 1120  WBC 9.7  NEUTROABS 6.4  HGB 11.5*  HCT 37.0  MCV 83.3  PLT 595   Basic Metabolic Panel: Recent Labs  Lab 11/05/20 1120  NA 136  K 3.2*  CL 101  CO2 24  GLUCOSE  112*  BUN 19  CREATININE 1.40*  CALCIUM 8.8*  MG 1.6*   GFR: Estimated Creatinine Clearance: 28.8 mL/min (A) (  by C-G formula based on SCr of 1.4 mg/dL (H)). Liver Function Tests: Recent Labs  Lab 11/05/20 1120  AST 25  ALT 13  ALKPHOS 48  BILITOT 0.8  PROT 7.9  ALBUMIN 3.3*   Recent Labs  Lab 11/05/20 1120  LIPASE 40   No results for input(s): AMMONIA in the last 168 hours. Coagulation Profile: No results for input(s): INR, PROTIME in the last 168 hours. Cardiac Enzymes: No results for input(s): CKTOTAL, CKMB, CKMBINDEX, TROPONINI in the last 168 hours. BNP (last 3 results) No results for input(s): PROBNP in the last 8760 hours. HbA1C: No results for input(s): HGBA1C in the last 72 hours. CBG: No results for input(s): GLUCAP in the last 168 hours. Lipid Profile: No results for input(s): CHOL, HDL, LDLCALC, TRIG, CHOLHDL, LDLDIRECT in the last 72 hours. Thyroid Function Tests: No results for input(s): TSH, T4TOTAL, FREET4, T3FREE, THYROIDAB in the last 72 hours. Anemia Panel: No results for input(s): VITAMINB12, FOLATE, FERRITIN, TIBC, IRON, RETICCTPCT in the last 72 hours. Urine analysis:    Component Value Date/Time   COLORURINE YELLOW 04/13/2019 1049   APPEARANCEUR CLOUDY (A) 04/13/2019 1049   LABSPEC >=1.030 (A) 04/13/2019 1049   PHURINE 5.0 04/13/2019 1049   GLUCOSEU NEGATIVE 04/13/2019 1049   HGBUR LARGE (A) 04/13/2019 1049   BILIRUBINUR NEGATIVE 04/13/2019 1049   KETONESUR TRACE (A) 04/13/2019 1049   UROBILINOGEN 0.2 04/13/2019 1049   NITRITE NEGATIVE 04/13/2019 1049   LEUKOCYTESUR MODERATE (A) 04/13/2019 1049    Radiological Exams on Admission: CT ABDOMEN PELVIS WO CONTRAST  Result Date: 11/05/2020 CLINICAL DATA:  Abdominal pain, acute, nonlocalized. Diarrhea. Shortness of breath. EXAM: CT ABDOMEN AND PELVIS WITHOUT CONTRAST TECHNIQUE: Multidetector CT imaging of the abdomen and pelvis was performed following the standard protocol without IV  contrast. COMPARISON:  08/06/2014 FINDINGS: Lower chest: Pleural effusion on the right layering dependently. Atelectasis and or infiltrate at both lung bases, right more than left. Hepatobiliary: Previous cholecystectomy. Liver parenchyma appears normal. Pancreas: Normal Spleen: Normal Adrenals/Urinary Tract: Adrenal glands are normal. Renal cysts on the right. No evidence of stone or hydronephrosis. Bladder is normal. Stomach/Bowel: Stomach and small intestine are unremarkable. Extensive diverticulosis of the colon without visible diverticulitis. Vascular/Lymphatic: Aortic atherosclerosis. No aneurysm. IVC is normal. No adenopathy. Reproductive: Previous hysterectomy.  No pelvic mass. Other: No free fluid or air. Musculoskeletal: Chronic degenerative changes throughout the lumbar region. Chronic osteitis pubis with fusion. Bilateral sacroiliac osteoarthritis. IMPRESSION: No acute intra-abdominal finding. Extensive diverticulosis but without definite diverticulitis. Low level diverticulitis can be inapparent at imaging. Right pleural effusion. Bilateral lower lobe atelectasis/infiltrate. Previous cholecystectomy. Aortic Atherosclerosis (ICD10-I70.0). Electronically Signed   By: Nelson Chimes M.D.   On: 11/05/2020 12:06   DG Chest Port 1 View  Result Date: 11/05/2020 CLINICAL DATA:  Shortness of breath EXAM: PORTABLE CHEST 1 VIEW COMPARISON:  08/06/2020 FINDINGS: Lordotic position film. Under penetration. Cardiomegaly and aortic atherosclerosis again seen. The upper lungs are clear. Question bilateral lower lobe infiltrates and or bilateral pleural effusions with volume loss. Two-view chest radiography recommended when able. IMPRESSION: Technical issues as above. Question bilateral lower lobe pneumonia and or bilateral effusions and atelectasis. Two-view chest radiography recommended when able. Electronically Signed   By: Nelson Chimes M.D.   On: 11/05/2020 12:02     EKG: Independently reviewed, with result as  described above.    Assessment/Plan     Principal Problem:   Community acquired bilateral lower lobe pneumonia Active Problems:   Type 2 diabetes mellitus with diabetic  neuropathy, unspecified ([place])   Essential hypertension   Hypokalemia   Hypomagnesemia   SOB (shortness of breath)   COPD with acute exacerbation (HCC)   Diarrhea   CKD (chronic kidney disease) stage 3, GFR 30-59 ml/min (HCC)   History of anemia due to CKD    #) Community-acquired pneumonia: Diagnosis of extensive 6 days of progressive shortness of breath, worsening nonproductive cough, with chest x-ray and CT abdomen/pelvis demonstrating evidence of bilateral lower lobe infiltrates concerning for pneumonia.  He is also CRITERIA met at this time is mild tachypnea, and therefore criteria for sepsis not currently met.  Of note, no evidence of hypotension.  No evidence of additional underlying infectious process at this time, although will engage in additional evaluation for recent diarrhea, and also check urinalysis.  Of note, COVID-19/[place] PCR found to be negative.  Suspect an element of acute COPD exacerbation as a consequence of underlying community-acquired pneumonia, with presentation also notable for acute hypoxic respiratory distress, as further quantified below.  Blood cultures x2 were collected at [facility] ED prior to initiation of azithromycin and Rocephin.  In the setting of recent GI symptoms and bilateral appearance of infiltrates, will check Legionella urine antigen.  Plan: Follow-up results of blood cultures x2 collected earlier today.  Continue azithromycin and Rocephin.  Repeat CBC with differential in the morning.  Add on procalcitonin level.  Check serum phosphorus level.  Monitor on continuous pulse telemetry.  Flutter valve and incentive spirometry ordered.  Check Legionella urine antigen as well as strep pneumoniae urine antigen.      #) Acute hypoxic respiratory distress: In the setting  of no known baseline supplemental oxygen requirements , initial oxygen saturation noted to be in the mid 80s, subsequently improving into the mid to high 90s on 2 L nasal cannula, thereby meeting criteria for acute Evoxac respiratory distress as opposed to failure in the context of presenting shortness of breath, as above.  Appears to be multifactorial in nature, with contributions from suspected community-acquired pneumonia, with radiographic evidence of bibasilar infiltrates, as above.  Additionally, suspect a secondary contribution from acute COPD exacerbation as a result of underlying pneumonia.  Clinically and radiographically, acute decompensated heart failure appears less likely.  Additionally, clinically, acute pulmonary embolism also appears less likely at this time.  Condition also suggestive of ACS in the absence of any chest pain, while presenting EKG shows no evidence of acute ischemic changes.  Plan: Further evaluation with urinary COVID-pneumonia, including continuation of IV antibiotics, as above.  Additionally, further evaluation management suspected secondary acute COPD exacerbation given his presentation I believe, including checking blood gas, as well as initiating systemic corticosteroids, with scheduled duo nebulizer treatments as needed albuterol nebs.  Monitor continuous pulse oximetry.  Add on BNP.  Repeat CMP and CBC in the morning.  Check serum phosphorus level, and repeat serum magnesium level in the morning.     #) Acute COPD exacerbation: In the setting of a documented history of COPD in the context of a long prior smoking history, presentation appears to be associated with COPD exacerbation as a secondary consequence of underlying community-acquired pneumonia, as above.  Outpatient respiratory regimen, with which the patient reports good compliance, appears to consist of Brovana, Pulmicort, as needed albuterol inhaler.  No known baseline supplemental oxygen  requirements.  Plan: Solumedrol, scheduled duo nebulizers, as needed albuterol nebulizer.  Monitor continuous pulse oximetry.  Monitor on telemetry.  Further evaluation management chest x-ray finding, including continuation of IV  antibiotics, as above.  Her blood gas.  Add on BNP, as above.       #) Essential hypertension: Documented history of such, on valsartan, hydralazine, Toprol-XL, and HCTZ as an outpatient.  Presenting systolic blood pressures mildly elevated, as conveyed above, with suspected contributions from multiple missed doses of home antihypertensive medications over the course of her presentation to the ED today.  No evidence of associated acute focal neurologic deficits.  As the patient appears mildly dehydrated, will hold home HCTZ for now.   Plan: Hold home HCTZ for now, as above.  Resume home hydralazine, valsartan, Toprol-XL, with close monitoring maintain blood pressure via routine vital signs, particularly given suspected presenting pulmonary infection, as above.  Monitor strict I's and O's and daily weights.  Monitor on telemetry.      #) hypokalemia: Presenting potassium 3.2, which appears to be as a consequence of increased GI losses over the last 5 to 6 days as well as accompanying decline in oral intake.  Dietary status post 40 mEq of oral potassium chloride administered at [place] ED earlier today in ER  Plan: Monitor on telemetry.  Repeat CMP and serum magnesium levels in the morning.  Further evaluation management of the presenting loose stool , as further detailed above.       #) Acute Diarrhea: Patient reports average of regular daily episodes of watery, nonbloody loose over the course of the last week, and meet criteria for acute diarrhea this will be considered hydration with loose stool.  Appears associated with new onset bilateral lower abdominal quadrant discomfort, with presenting CT abdomen/pelvis showing no evidence of acute  intra-abdominal process.  Unclear source at this time, including no evidence of overt colitis on today's imaging.  Of note, the patient has had increased risk for  C diff given use of PPI as outpatient due to associated increased risk for translocation of gut bacteria.  Of note recent travel or sick contacts.  Beyond this hospitalization the differential includes osmotic palpitations, including multiple medications that can be associated with causing diarrhea.   Plan: C diff PCR, stool lactoferrin, stool cx.  Gentle IV fluids in the form of lactated Ringer's at 50 cc/h x 8 hours.Will refrain from use of anti-motility agents until underlying infectious source as been ruled-out. Close monitoring of ensuing electrolytes and renal function with repeat CMP in the morning.  Repeat serum magnesium level in the morning.  Repeat CBC in the morning.Monitor strict I&O's and daily weights.  Hold home potential contributory medications, including Protonix.  Additionally, will hold home scheduled Colace for now.      #) Hypomagnesemia: Present extremities without compensating sugars to be multifactorial nature, with suspected contributions from presenting stool over the course of the last week as well as associated decline in oral intake over that time.  Status post magnesium sulfate 2 g IV over 2 hours x 1 dose administered at [facility] ED today.  Will close monitoring to see magnesium level, particularly in the setting of borderline QTC prolongation per initial EKG.  Plan: Repeat serum magnesium level in the morning.  Telemetry.      #) Type 2 Diabetes Mellitus: documented history of such. Home insulin regimen: none. Home oral hypoglycemic agents: Glimepiride.  Additionally, she is on Januvia at home .presenting blood sugar 112.    Plan: accuchecks QAC and HS with low-dose sliding scale insulin.  Hold home glimepiride and Januvia during this hospitalization.      #)  Stage IIIb chronic  kidney disease: Associated with baseline creatinine of 1.4 labs consistent with this baseline range.   Plan: Monitor strict I's and O's and daily weights.  Repeat CMP in the morning.      #) Anemia of chronic kidney disease: Associated baseline hemoglobin of 10-13, with presenting hemoglobin consistent with this range, and trending up slightly relative to most recent prior value in July 2022, potentially representing an element of hemoconcentration due to suspected presenting dehydration from recent increase in GI losses concomitant with decline in oral intake.  Plan: Repeat CBC in the morning.      DVT prophylaxis: scd's   Code Status: Full code Family Communication: none Disposition Plan: Per Rounding Team Consults called: none;  Admission status: Inpatient; med telemetry     Of note, this patient was added by me to the following Admit List/Treatment Team: [email or web address].    Of note, the Adult Admission Order Set (Multimorbid order set) was used by me in the admission process for this patient.   PLEASE NOTE THAT DRAGON DICTATION SOFTWARE WAS USED IN THE CONSTRUCTION OF THIS NOTE.   Rhetta Mura DO [facility] Pager [phone] From [place]   11/05/2020, 9:12 PM

## 2020-11-05 NOTE — ED Notes (Signed)
[facility] on unit for transport at this time

## 2020-11-05 NOTE — ED Triage Notes (Signed)
Abd pain , diarrhea x 1 week  shortness of breath , Hx HTN . Hyperventilations during triage .

## 2020-11-05 NOTE — Progress Notes (Signed)
Pt arrived to our unit, 4W. MD paged for orders.  BP 191/61

## 2020-11-05 NOTE — ED Notes (Signed)
ED TO INPATIENT HANDOFF REPORT  ED Nurse Name and Phone #:   S Name/Age/Gender Rebecca Elliott 87 y.o. female Room/Bed: MH02/MH02  Code Status   Code Status: Not on file  Home/SNF/Other Home Patient oriented to: self, place, time, and situation Is this baseline? Yes   Triage Complete: Triage complete  Chief Complaint Community acquired bilateral lower lobe pneumonia [J18.9]  Triage Note Abd pain , diarrhea x 1 week  shortness of breath , Hx HTN . Hyperventilations during triage .    Allergies No Known Allergies  Level of Care/Admitting Diagnosis ED Disposition     ED Disposition  Admit   Condition  --   Comment  Hospital Area: [facility] [[id number]]  Level of Care: Telemetry [5]  Admit to tele based on following criteria: Monitor for Ischemic changes  May admit patient to [facility] or [facility] if equivalent level of care is available:: No  Interfacility transfer: Yes  Covid Evaluation: Asymptomatic Screening Protocol (No Symptoms)  Diagnosis: Community acquired bilateral lower lobe pneumonia [[id number]]  Admitting Physician: Reubin Milan [[id number]]  Attending Physician: Reubin Milan [[id number]]  Estimated length of stay: past midnight tomorrow  Certification:: I certify this patient will need inpatient services for at least 2 midnights          B Medical/Surgery History Past Medical History:  Diagnosis Date   Allergic rhinitis    COPD  and ASTHMA    Depression    Diabetes mellitus    as an adult   DJD (degenerative joint disease)    GERD (gastroesophageal reflux disease)    and HH w/ reflux   History of colonic polyps    Hyperlipidemia    Hypertension    Past Surgical History:  Procedure Laterality Date   ABDOMINAL HYSTERECTOMY     apparently no oophorectomy   APPENDECTOMY     bilateral breast tumor     BREAST EXCISIONAL BIOPSY Bilateral    one in her 20's and one in her 33's, both benign   CATARACT EXTRACTION  Bilateral    [place]   CATARACT EXTRACTION, BILATERAL     CHOLECYSTECTOMY     TONSILLECTOMY     TUBAL LIGATION       A IV Location/Drains/Wounds Patient Lines/Drains/Airways Status     Active Line/Drains/Airways     Name Placement date Placement time Site Days   Peripheral IV 11/05/20 20 G Right Antecubital 11/05/20  1118  Antecubital  less than 1   Peripheral IV 11/05/20 20 G Left Forearm 11/05/20  1520  Forearm  less than 1            Intake/Output Last 24 hours  Intake/Output Summary (Last 24 hours) at 11/05/2020 1608 Last data filed at 11/05/2020 1558 Gross per 24 hour  Intake 1101.5 ml  Output --  Net 1101.5 ml    Labs/Imaging Results for orders placed or performed during the hospital encounter of 11/05/20 (from the past 48 hour(s))  CBC with Differential     Status: Abnormal   Collection Time: 11/05/20 11:20 AM  Result Value Ref Range   WBC 9.7 4.0 - 10.5 K/uL   RBC 4.44 3.87 - 5.11 MIL/uL   Hemoglobin 11.5 (L) 12.0 - 15.0 g/dL   HCT 37.0 36.0 - 46.0 %   MCV 83.3 80.0 - 100.0 fL   MCH 25.9 (L) 26.0 - 34.0 pg   MCHC 31.1 30.0 - 36.0 g/dL   RDW 16.9 (H) 11.5 - 15.5 %  Platelets 281 150 - 400 K/uL   nRBC 0.0 0.0 - 0.2 %   Neutrophils Relative % 67 %   Neutro Abs 6.4 1.7 - 7.7 K/uL   Lymphocytes Relative 25 %   Lymphs Abs 2.4 0.7 - 4.0 K/uL   Monocytes Relative 7 %   Monocytes Absolute 0.7 0.1 - 1.0 K/uL   Eosinophils Relative 0 %   Eosinophils Absolute 0.0 0.0 - 0.5 K/uL   Basophils Relative 1 %   Basophils Absolute 0.1 0.0 - 0.1 K/uL   Immature Granulocytes 0 %   Abs Immature Granulocytes 0.03 0.00 - 0.07 K/uL    Comment: Performed at [facility], [address] [place], [place] [address]  Comprehensive metabolic panel     Status: Abnormal   Collection Time: 11/05/20 11:20 AM  Result Value Ref Range   Sodium 136 135 - 145 mmol/L   Potassium 3.2 (L) 3.5 - 5.1 mmol/L   Chloride 101 98 - 111 mmol/L   CO2 24 22 - 32 mmol/L   Glucose,  Bld 112 (H) 70 - 99 mg/dL    Comment: Glucose reference range applies only to samples taken after fasting for at least 8 hours.   BUN 19 8 - 23 mg/dL   Creatinine, Ser 1.40 (H) 0.44 - 1.00 mg/dL   Calcium 8.8 (L) 8.9 - 10.3 mg/dL   Total Protein 7.9 6.5 - 8.1 g/dL   Albumin 3.3 (L) 3.5 - 5.0 g/dL   AST 25 15 - 41 U/L   ALT 13 0 - 44 U/L   Alkaline Phosphatase 48 38 - 126 U/L   Total Bilirubin 0.8 0.3 - 1.2 mg/dL   GFR, Estimated 35 (L) >60 mL/min    Comment: (NOTE) Calculated using the CKD-EPI Creatinine Equation (2021)    Anion gap 11 5 - 15    Comment: Performed at [facility], [address] [place], [place] [address]  Lipase, blood     Status: None   Collection Time: 11/05/20 11:20 AM  Result Value Ref Range   Lipase 40 11 - 51 U/L    Comment: Performed at [facility], [address] [place], [place] [address]  Resp Panel by RT-PCR (Flu A&B, Covid) Nasopharyngeal Swab     Status: None   Collection Time: 11/05/20 11:20 AM   Specimen: Nasopharyngeal Swab; Nasopharyngeal(NP) swabs in vial transport medium  Result Value Ref Range   SARS Coronavirus 2 by RT PCR NEGATIVE NEGATIVE    Comment: (NOTE) SARS-CoV-2 target nucleic acids are NOT DETECTED.  The SARS-CoV-2 RNA is generally detectable in upper respiratory specimens during the acute phase of infection. The lowest concentration of SARS-CoV-2 viral copies this assay can detect is 138 copies/mL. A negative result does not preclude SARS-Cov-2 infection and should not be used as the sole basis for treatment or other patient management decisions. A negative result may occur with  improper specimen collection/handling, submission of specimen other than nasopharyngeal swab, presence of viral mutation(s) within the areas targeted by this assay, and inadequate number of viral copies(<138 copies/mL). A negative result must be combined with clinical observations, patient history, and  epidemiological information. The expected result is Negative.  Fact Sheet for Patients:  EntrepreneurPulse.com.au  Fact Sheet for Healthcare Providers:  IncredibleEmployment.be  This test is no t yet approved or cleared by the [place] FDA and  has been authorized for detection and/or diagnosis of SARS-CoV-2 by FDA under an Emergency Use  Authorization (EUA). This EUA will remain  in effect (meaning this test can be used) for the duration of the COVID-19 declaration under Section 564(b)(1) of the Act, 21 U.S.C.section 360bbb-3(b)(1), unless the authorization is terminated  or revoked sooner.       Influenza A by PCR NEGATIVE NEGATIVE   Influenza B by PCR NEGATIVE NEGATIVE    Comment: (NOTE) The Xpert Xpress SARS-CoV-2/FLU/RSV plus assay is intended as an aid in the diagnosis of influenza from Nasopharyngeal swab specimens and should not be used as a sole basis for treatment. Nasal washings and aspirates are unacceptable for Xpert Xpress SARS-CoV-2/FLU/RSV testing.  Fact Sheet for Patients: EntrepreneurPulse.com.au  Fact Sheet for Healthcare Providers: IncredibleEmployment.be  This test is not yet approved or cleared by the [place] FDA and has been authorized for detection and/or diagnosis of SARS-CoV-2 by FDA under an Emergency Use Authorization (EUA). This EUA will remain in effect (meaning this test can be used) for the duration of the COVID-19 declaration under Section 564(b)(1) of the Act, 21 U.S.C. section 360bbb-3(b)(1), unless the authorization is terminated or revoked.  Performed at [facility], [address] [place], [place] [address]   Magnesium     Status: Abnormal   Collection Time: 11/05/20 11:20 AM  Result Value Ref Range   Magnesium 1.6 (L) 1.7 - 2.4 mg/dL    Comment: Performed at [facility], [address] [place], [place] [address]   CT  ABDOMEN PELVIS WO CONTRAST  Result Date: 11/05/2020 CLINICAL DATA:  Abdominal pain, acute, nonlocalized. Diarrhea. Shortness of breath. EXAM: CT ABDOMEN AND PELVIS WITHOUT CONTRAST TECHNIQUE: Multidetector CT imaging of the abdomen and pelvis was performed following the standard protocol without IV contrast. COMPARISON:  08/06/2014 FINDINGS: Lower chest: Pleural effusion on the right layering dependently. Atelectasis and or infiltrate at both lung bases, right more than left. Hepatobiliary: Previous cholecystectomy. Liver parenchyma appears normal. Pancreas: Normal Spleen: Normal Adrenals/Urinary Tract: Adrenal glands are normal. Renal cysts on the right. No evidence of stone or hydronephrosis. Bladder is normal. Stomach/Bowel: Stomach and small intestine are unremarkable. Extensive diverticulosis of the colon without visible diverticulitis. Vascular/Lymphatic: Aortic atherosclerosis. No aneurysm. IVC is normal. No adenopathy. Reproductive: Previous hysterectomy.  No pelvic mass. Other: No free fluid or air. Musculoskeletal: Chronic degenerative changes throughout the lumbar region. Chronic osteitis pubis with fusion. Bilateral sacroiliac osteoarthritis. IMPRESSION: No acute intra-abdominal finding. Extensive diverticulosis but without definite diverticulitis. Low level diverticulitis can be inapparent at imaging. Right pleural effusion. Bilateral lower lobe atelectasis/infiltrate. Previous cholecystectomy. Aortic Atherosclerosis (ICD10-I70.0). Electronically Signed   By: Nelson Chimes M.D.   On: 11/05/2020 12:06   DG Chest Port 1 View  Result Date: 11/05/2020 CLINICAL DATA:  Shortness of breath EXAM: PORTABLE CHEST 1 VIEW COMPARISON:  08/06/2020 FINDINGS: Lordotic position film. Under penetration. Cardiomegaly and aortic atherosclerosis again seen. The upper lungs are clear. Question bilateral lower lobe infiltrates and or bilateral pleural effusions with volume loss. Two-view chest radiography recommended  when able. IMPRESSION: Technical issues as above. Question bilateral lower lobe pneumonia and or bilateral effusions and atelectasis. Two-view chest radiography recommended when able. Electronically Signed   By: Nelson Chimes M.D.   On: 11/05/2020 12:02    Pending Labs Unresulted Labs (From admission, onward)     Start     Ordered   11/05/20 1334  Blood culture (routine x 2)  BLOOD CULTURE X 2,   STAT      11/05/20 1333  Vitals/Pain Today's Vitals   11/05/20 1250 11/05/20 1300 11/05/20 1330 11/05/20 1400  BP:  (!) 204/74 (!) 176/66 (!) 174/65  Pulse:  82 80 78  Resp:  (!) 26 (!) 22 19  Temp:      SpO2:  98% 97% 92%  Weight:      Height:      PainSc: 0-No pain       Isolation Precautions No active isolations  Medications Medications  magnesium sulfate IVPB 2 g 50 mL (has no administration in time range)  albuterol (VENTOLIN HFA) 108 (90 Base) MCG/ACT inhaler (4 puffs  Given 11/05/20 1055)  sodium chloride 0.9 % bolus 1,000 mL (0 mLs Intravenous Stopped 11/05/20 1250)  morphine 2 MG/ML injection 2 mg (2 mg Intravenous Given 11/05/20 1123)  ondansetron (ZOFRAN) injection 4 mg (4 mg Intravenous Given 11/05/20 1123)  cefTRIAXone (ROCEPHIN) 1 g in sodium chloride 0.9 % 100 mL IVPB (0 g Intravenous Stopped 11/05/20 1558)  azithromycin (ZITHROMAX) 500 mg in sodium chloride 0.9 % 250 mL IVPB (500 mg Intravenous New Bag/Given 11/05/20 1419)  potassium chloride SA (KLOR-CON) CR tablet 40 mEq (40 mEq Oral Given 11/05/20 1532)    Mobility walks with device Moderate fall risk   Focused Assessments    R Recommendations: See Admitting Provider Note  Report given to:   Additional Notes:

## 2020-11-05 NOTE — ED Notes (Signed)
BC x 2 obtained prior to abx therapy BC #1 from Rt arm BC, #2 from Left arm

## 2020-11-05 NOTE — ED Provider Notes (Signed)
[facility] Provider Note   CSN: [id number] Arrival date & time: 11/05/20  1036     History Chief Complaint  Patient presents with   Shortness of Breath    [facility] is a 87 y.o. female.  87 yo F with a chief complaints of diarrhea.  This been going on for about a week now.  Described as mostly yellowish.  She has had some decreased oral intake due to frequent bowel movements.  Tried Imodium off and on without improvement.  She is noted to be quite tachypneic on arrival and states that she has had some trouble breathing for quite some time.  Feels like maybe that is a little bit worse over the past couple days.  Has been coughing a little bit but not much more than normal.  Denies any chest pain or pressure.  Has lower abdominal pain that seems to come and go.  Denies urinary symptoms.  The history is provided by the patient and a relative.  Shortness of Breath Associated symptoms: abdominal pain and cough   Associated symptoms: no chest pain, no fever, no headaches, no vomiting and no wheezing   Illness Severity:  Moderate Onset quality:  Gradual Duration:  2 days Timing:  Constant Progression:  Worsening Chronicity:  New Associated symptoms: abdominal pain, cough, diarrhea and shortness of breath   Associated symptoms: no chest pain, no congestion, no fever, no headaches, no myalgias, no nausea, no rhinorrhea, no vomiting and no wheezing       Past Medical History:  Diagnosis Date   Allergic rhinitis    COPD  and ASTHMA    Depression    Diabetes mellitus    as an adult   DJD (degenerative joint disease)    GERD (gastroesophageal reflux disease)    and HH w/ reflux   History of colonic polyps    Hyperlipidemia    Hypertension     Patient Active Problem List   Diagnosis Date Noted   Community acquired bilateral lower lobe pneumonia 11/05/2020   Macular pucker, right eye 05/01/2020   Asthma 12/06/2019   Early stage nonexudative  age-related macular degeneration of both eyes 09/25/2019   Branch retinal vein occlusion with macular edema of left eye 06/12/2019   Retinal telangiectasia of left eye 04/23/2019   Snores 04/23/2019   Primary osteoarthritis of right knee 06/22/2016   PCP NOTES >>>>>>>>>>>>> 11/19/2014   Abdominal lymphadenopathy 08/09/2014   Pulmonary nodules 05/01/2014   Allergic rhinitis 04/18/2014   Tremor 09/26/2013   Elevated LFTs 08/13/2011   Annual physical exam 07/10/2010   Osteoarthritis 07/08/2009   COLONIC POLYPS, ADENOMATOUS, HX OF 08/14/2008   ALLERGIC RHINITIS 05/02/2007   Type 2 diabetes mellitus with diabetic neuropathy, unspecified ([place]) 12/12/2006   Hyperlipidemia 10/10/2006   DEPRESSION 10/10/2006   GERD 10/10/2006   HIATAL HERNIA WITH REFLUX 10/10/2006   Essential hypertension 03/13/2006   COPD  03/13/2006    Past Surgical History:  Procedure Laterality Date   ABDOMINAL HYSTERECTOMY     apparently no oophorectomy   APPENDECTOMY     bilateral breast tumor     BREAST EXCISIONAL BIOPSY Bilateral    one in her 20's and one in her 44's, both benign   CATARACT EXTRACTION Bilateral    Groat   CATARACT EXTRACTION, BILATERAL     CHOLECYSTECTOMY     TONSILLECTOMY     TUBAL LIGATION       OB History   No obstetric history on  file.     Family History  Problem Relation Age of Onset   Breast cancer Sister        x 2   Diabetes Father    Diabetes Sister    Heart attack Brother        early 102s   Heart attack Son    Pancreatic cancer Son    Stomach cancer Sister    Colon cancer Neg Hx     Social History   Tobacco Use   Smoking status: Former    Packs/day: 1.00    Years: 35.00    Pack years: 35.00    Types: Cigarettes    Quit date: 01/18/1993    Years since quitting: 27.8   Smokeless tobacco: Never  Substance Use Topics   Alcohol use: No    Alcohol/week: 0.0 standard drinks   Drug use: No    Home Medications Prior to Admission medications   Medication Sig  Start Date End Date Taking? Authorizing Provider  budesonide (PULMICORT) 0.25 MG/2ML nebulizer solution TAKE 2 ML VIA NEBULIZATION IN THE MORNING AND AT BEDTIME 10/13/20   Colon Branch, MD  acetaminophen (TYLENOL) 500 MG tablet Take 500 mg by mouth as needed.    [provider]  albuterol (VENTOLIN HFA) 108 (90 Base) MCG/ACT inhaler Inhale 2 puffs into the lungs every 6 (six) hours as needed for wheezing or shortness of breath. 03/04/20   Colon Branch, MD  arformoterol (BROVANA) 15 MCG/2ML NEBU Take 2 mLs (15 mcg total) by nebulization in the morning and at bedtime. 04/10/20   Colon Branch, MD  aspirin 81 MG tablet Take 81 mg by mouth daily.    [provider]  atorvastatin (LIPITOR) 10 MG tablet Take 1 tablet (10 mg total) by mouth daily. 06/25/20   Colon Branch, MD  dextromethorphan-guaiFENesin [facility] DM) 30-600 MG per 12 hr tablet Take 1 tablet by mouth daily.    [provider]  diclofenac sodium (VOLTAREN) 1 % GEL Apply 2 g topically 4 (four) times daily. 07/27/18   Colon Branch, MD  docusate sodium (COLACE) 100 MG capsule Take 1-2 by mouth once a day.    [provider]  famotidine (PEPCID) 20 MG tablet Take 1 tablet (20 mg total) by mouth 2 (two) times daily. 12/05/19   Colon Branch, MD  glimepiride (AMARYL) 2 MG tablet Take 1 tablet (2 mg total) by mouth daily with breakfast. 06/25/20   Colon Branch, MD  hydrALAZINE (APRESOLINE) 25 MG tablet Take 1 tablet (25 mg total) by mouth 3 (three) times daily. 09/10/20   Colon Branch, MD  hydrochlorothiazide (HYDRODIURIL) 25 MG tablet TAKE 1/2 TABLET BY MOUTH EVERY DAY 09/05/20   Colon Branch, MD  ipratropium-albuterol (DUONEB) 0.5-2.5 (3) MG/3ML SOLN Take 3 mLs by nebulization every 6 (six) hours as needed. 01/22/20   Colon Branch, MD  Lidocaine 3 % CREA Apply 1 application topically 3 (three) times daily as needed. 07/27/18   Colon Branch, MD  loratadine (CLARITIN) 10 MG tablet Take 10 mg by mouth daily.    [provider]   metoprolol succinate (TOPROL XL) 100 MG 24 hr tablet Take 1 tablet (100 mg total) by mouth daily. Take with or immediately following a meal. 06/17/20   Colon Branch, MD  Multiple Vitamins-Minerals (CENTRUM SILVER) CHEW Chew by mouth.    [provider]  NONFORMULARY OR COMPOUNDED ITEM Compression Stockings Dx: H06.23 04/13/19   Paz,  Alda Berthold, MD  pantoprazole (PROTONIX) 40 MG tablet Take 1 tablet (40 mg total) by mouth every other day. 12/05/19   Colon Branch, MD  sitaGLIPtin (JANUVIA) 50 MG tablet Take 1 tablet (50 mg total) by mouth daily. 06/25/20   Colon Branch, MD  valsartan (DIOVAN) 160 MG tablet TAKE 1 TABLET BY MOUTH EVERY DAY 10/06/20   Colon Branch, MD  Wheat Dextrin (BENEFIBER DRINK MIX PO) Take by mouth 2 (two) times daily.    [provider]    Allergies    Patient has no known allergies.  Review of Systems   Review of Systems  Constitutional:  Negative for chills and fever.  HENT:  Negative for congestion and rhinorrhea.   Eyes:  Negative for redness and visual disturbance.  Respiratory:  Positive for cough and shortness of breath. Negative for wheezing.   Cardiovascular:  Negative for chest pain and palpitations.  Gastrointestinal:  Positive for abdominal pain and diarrhea. Negative for nausea and vomiting.  Genitourinary:  Negative for dysuria and urgency.  Musculoskeletal:  Negative for arthralgias and myalgias.  Skin:  Negative for pallor and wound.  Neurological:  Negative for dizziness and headaches.   Physical Exam Updated Vital Signs BP (!) 176/66   Pulse 80   Temp 98 F (36.7 C)   Resp (!) 22   Ht 5\' 6"  (1.676 m)   Wt 88.8 kg   SpO2 97%   BMI 31.60 kg/m   Physical Exam Vitals and nursing note reviewed.  Constitutional:      General: She is not in acute distress.    Appearance: She is well-developed. She is not diaphoretic.  HENT:     Head: Normocephalic and atraumatic.  Eyes:     Pupils: Pupils are equal, round, and reactive to light.   Cardiovascular:     Rate and Rhythm: Normal rate and regular rhythm.     Heart sounds: No murmur heard.   No friction rub. No gallop.  Pulmonary:     Effort: Pulmonary effort is normal.     Breath sounds: No wheezing or rales.  Abdominal:     General: There is no distension.     Palpations: Abdomen is soft.     Tenderness: There is no abdominal tenderness.     Comments: No significant abdominal discomfort on exam.  Genitourinary:    Comments: Hemorrhoids without any active bleeding yellowish stool in the vault no gross blood Musculoskeletal:        General: No tenderness.     Cervical back: Normal range of motion and neck supple.  Skin:    General: Skin is warm and dry.  Neurological:     Mental Status: She is alert and oriented to person, place, and time.  Psychiatric:        Behavior: Behavior normal.    ED Results / Procedures / Treatments   Labs (all labs ordered are listed, but only abnormal results are displayed) Labs Reviewed  CBC WITH DIFFERENTIAL/PLATELET - Abnormal; Notable for the following components:      Result Value   Hemoglobin 11.5 (*)    MCH 25.9 (*)    RDW 16.9 (*)    All other components within normal limits  COMPREHENSIVE METABOLIC PANEL - Abnormal; Notable for the following components:   Potassium 3.2 (*)    Glucose, Bld 112 (*)    Creatinine, Ser 1.40 (*)    Calcium 8.8 (*)    Albumin 3.3 (*)  GFR, Estimated 35 (*)    All other components within normal limits  MAGNESIUM - Abnormal; Notable for the following components:   Magnesium 1.6 (*)    All other components within normal limits  RESP PANEL BY RT-PCR (FLU A&B, COVID) ARPGX2  CULTURE, BLOOD (ROUTINE X 2)  CULTURE, BLOOD (ROUTINE X 2)  LIPASE, BLOOD    EKG EKG Interpretation  Date/Time:  Wednesday November 05 2020 10:54:53 EDT Ventricular Rate:  72 PR Interval:  175 QRS Duration: 108 QT Interval:  453 QTC Calculation: 496 R Axis:   26 Text Interpretation: Sinus rhythm Probable  left ventricular hypertrophy Borderline prolonged QT interval No old tracing to compare Confirmed by Deno Etienne [phone]) on 11/05/2020 11:40:24 AM  Radiology CT ABDOMEN PELVIS WO CONTRAST  Result Date: 11/05/2020 CLINICAL DATA:  Abdominal pain, acute, nonlocalized. Diarrhea. Shortness of breath. EXAM: CT ABDOMEN AND PELVIS WITHOUT CONTRAST TECHNIQUE: Multidetector CT imaging of the abdomen and pelvis was performed following the standard protocol without IV contrast. COMPARISON:  08/06/2014 FINDINGS: Lower chest: Pleural effusion on the right layering dependently. Atelectasis and or infiltrate at both lung bases, right more than left. Hepatobiliary: Previous cholecystectomy. Liver parenchyma appears normal. Pancreas: Normal Spleen: Normal Adrenals/Urinary Tract: Adrenal glands are normal. Renal cysts on the right. No evidence of stone or hydronephrosis. Bladder is normal. Stomach/Bowel: Stomach and small intestine are unremarkable. Extensive diverticulosis of the colon without visible diverticulitis. Vascular/Lymphatic: Aortic atherosclerosis. No aneurysm. IVC is normal. No adenopathy. Reproductive: Previous hysterectomy.  No pelvic mass. Other: No free fluid or air. Musculoskeletal: Chronic degenerative changes throughout the lumbar region. Chronic osteitis pubis with fusion. Bilateral sacroiliac osteoarthritis. IMPRESSION: No acute intra-abdominal finding. Extensive diverticulosis but without definite diverticulitis. Low level diverticulitis can be inapparent at imaging. Right pleural effusion. Bilateral lower lobe atelectasis/infiltrate. Previous cholecystectomy. Aortic Atherosclerosis (ICD10-I70.0). Electronically Signed   By: Nelson Chimes M.D.   On: 11/05/2020 12:06   DG Chest Port 1 View  Result Date: 11/05/2020 CLINICAL DATA:  Shortness of breath EXAM: PORTABLE CHEST 1 VIEW COMPARISON:  08/06/2020 FINDINGS: Lordotic position film. Under penetration. Cardiomegaly and aortic atherosclerosis again seen.  The upper lungs are clear. Question bilateral lower lobe infiltrates and or bilateral pleural effusions with volume loss. Two-view chest radiography recommended when able. IMPRESSION: Technical issues as above. Question bilateral lower lobe pneumonia and or bilateral effusions and atelectasis. Two-view chest radiography recommended when able. Electronically Signed   By: Nelson Chimes M.D.   On: 11/05/2020 12:02    Procedures Procedures   Medications Ordered in ED Medications  cefTRIAXone (ROCEPHIN) 1 g in sodium chloride 0.9 % 100 mL IVPB (has no administration in time range)  azithromycin (ZITHROMAX) 500 mg in sodium chloride 0.9 % 250 mL IVPB (has no administration in time range)  albuterol (VENTOLIN HFA) 108 (90 Base) MCG/ACT inhaler (4 puffs  Given 11/05/20 1055)  sodium chloride 0.9 % bolus 1,000 mL (0 mLs Intravenous Stopped 11/05/20 1250)  morphine 2 MG/ML injection 2 mg (2 mg Intravenous Given 11/05/20 1123)  ondansetron (ZOFRAN) injection 4 mg (4 mg Intravenous Given 11/05/20 1123)    ED Course  I have reviewed the triage vital signs and the nursing notes.  Pertinent labs & imaging results that were available during my care of the patient were reviewed by me and considered in my medical decision making (see chart for details).    MDM Rules/Calculators/A&P  87 yo F with a cc of abdominal discomfort and diarrhea.  Going on for about a week now.  Patient most notably with shortness of breath on our initial exams.  Short breath with very minimal exertion and lying back.  Patient tells me that is been an ongoing issue and she is not as worried about that.  She is given a breathing treatment with some improvement.  Will obtain a CT scan of the abdomen pelvis blood work reassess.  As well as leaving the room the family stated that there was some blood in her stool.  To have some trouble describing it.  Rectal exam without any gross bleeding.  Hemoglobin is  actually little bit higher than baseline.  She appears slightly dehydrated we will give a bolus of IV fluids.  Will reassess.  Patient is feeling much better on reassessment though now she is on 2 L of oxygen.  Was hypoxic while at rest down into the mid 67s.  Laboratory work-up without significant issue.  Chest x-ray with some concern for multifocal pneumonia.  CT scan of the abdomen pelvis without intra-abdominal pathology but does confirm pneumonia.  Started on antibiotics.  Discussed with medicine for admission.  CRITICAL CARE Performed by: Cecilio Asper   Total critical care time: 35 minutes  Critical care time was exclusive of separately billable procedures and treating other patients.  Critical care was necessary to treat or prevent imminent or life-threatening deterioration.  Critical care was time spent personally by me on the following activities: development of treatment plan with patient and/or surrogate as well as nursing, discussions with consultants, evaluation of patient's response to treatment, examination of patient, obtaining history from patient or surrogate, ordering and performing treatments and interventions, ordering and review of laboratory studies, ordering and review of radiographic studies, pulse oximetry and re-evaluation of patient's condition.  The patients results and plan were reviewed and discussed.   Any x-rays performed were independently reviewed by myself.   Differential diagnosis were considered with the presenting HPI.  Medications  cefTRIAXone (ROCEPHIN) 1 g in sodium chloride 0.9 % 100 mL IVPB (has no administration in time range)  azithromycin (ZITHROMAX) 500 mg in sodium chloride 0.9 % 250 mL IVPB (has no administration in time range)  albuterol (VENTOLIN HFA) 108 (90 Base) MCG/ACT inhaler (4 puffs  Given 11/05/20 1055)  sodium chloride 0.9 % bolus 1,000 mL (0 mLs Intravenous Stopped 11/05/20 1250)  morphine 2 MG/ML injection 2 mg (2 mg  Intravenous Given 11/05/20 1123)  ondansetron (ZOFRAN) injection 4 mg (4 mg Intravenous Given 11/05/20 1123)    Vitals:   11/05/20 1130 11/05/20 1200 11/05/20 1245 11/05/20 1330  BP: (!) 163/59 (!) 155/58 (!) 172/70 (!) 176/66  Pulse: 85 82 80 80  Resp: (!) 35 18 (!) 21 (!) 22  Temp:      SpO2: (!) 84% 93% 96% 97%  Weight:      Height:        Final diagnoses:  Community acquired pneumonia of left lower lobe of lung  Diarrhea, unspecified type  Acute on chronic respiratory failure with hypoxia ([place])    Admission/ observation were discussed with the admitting physician, patient and/or family and they are comfortable with the plan.    Final Clinical Impression(s) / ED Diagnoses Final diagnoses:  Community acquired pneumonia of left lower lobe of lung  Diarrhea, unspecified type  Acute on chronic respiratory failure with hypoxia ([place])    Rx / DC Orders ED Discharge Orders  None        Deno Etienne, DO 11/05/20 1404

## 2020-11-05 NOTE — ED Notes (Signed)
Pt on monitor 

## 2020-11-06 ENCOUNTER — Encounter (HOSPITAL_COMMUNITY): Payer: Self-pay | Admitting: Internal Medicine

## 2020-11-06 ENCOUNTER — Inpatient Hospital Stay (HOSPITAL_COMMUNITY): Payer: Medicare Other

## 2020-11-06 DIAGNOSIS — J189 Pneumonia, unspecified organism: Secondary | ICD-10-CM | POA: Diagnosis not present

## 2020-11-06 DIAGNOSIS — R0602 Shortness of breath: Secondary | ICD-10-CM

## 2020-11-06 LAB — PHOSPHORUS: Phosphorus: 4.1 mg/dL (ref 2.5–4.6)

## 2020-11-06 LAB — ECHOCARDIOGRAM COMPLETE
AR max vel: 2.18 cm2
AV Area VTI: 2.32 cm2
AV Area mean vel: 2.18 cm2
AV Mean grad: 12 mmHg
AV Peak grad: 22.3 mmHg
Ao pk vel: 2.36 m/s
Area-P 1/2: 3.99 cm2
Calc EF: 52.2 %
Height: 66 in
MV VTI: 3.05 cm2
P 1/2 time: 550 msec
S' Lateral: 4.1 cm
Single Plane A2C EF: 51.4 %
Single Plane A4C EF: 56.1 %
Weight: 3132.8 oz

## 2020-11-06 LAB — GLUCOSE, CAPILLARY
Glucose-Capillary: 189 mg/dL — ABNORMAL HIGH (ref 70–99)
Glucose-Capillary: 163 mg/dL — ABNORMAL HIGH (ref 70–99)
Glucose-Capillary: 206 mg/dL — ABNORMAL HIGH (ref 70–99)
Glucose-Capillary: 201 mg/dL — ABNORMAL HIGH (ref 70–99)

## 2020-11-06 LAB — URINALYSIS, COMPLETE (UACMP) WITH MICROSCOPIC
Bilirubin Urine: NEGATIVE
Glucose, UA: NEGATIVE mg/dL
Hgb urine dipstick: NEGATIVE
Ketones, ur: 20 mg/dL — AB
Nitrite: NEGATIVE
Protein, ur: 100 mg/dL — AB
Specific Gravity, Urine: 1.014 (ref 1.005–1.030)
WBC, UA: >50 WBC/hpf — ABNORMAL HIGH (ref 0–5)
pH: 5 (ref 5.0–8.0)

## 2020-11-06 LAB — CBC WITH DIFFERENTIAL/PLATELET
Abs Immature Granulocytes: 0.02 10*3/uL (ref 0.00–0.07)
Basophils Absolute: 0 10*3/uL (ref 0.0–0.1)
Basophils Relative: 0 %
Eosinophils Absolute: 0 10*3/uL (ref 0.0–0.5)
Eosinophils Relative: 0 %
HCT: 35.9 % — ABNORMAL LOW (ref 36.0–46.0)
Hemoglobin: 10.6 g/dL — ABNORMAL LOW (ref 12.0–15.0)
Immature Granulocytes: 0 %
Lymphocytes Relative: 10 %
Lymphs Abs: 0.7 10*3/uL (ref 0.7–4.0)
MCH: 25.7 pg — ABNORMAL LOW (ref 26.0–34.0)
MCHC: 29.5 g/dL — ABNORMAL LOW (ref 30.0–36.0)
MCV: 87.1 fL (ref 80.0–100.0)
Monocytes Absolute: 0.1 10*3/uL (ref 0.1–1.0)
Monocytes Relative: 1 %
Neutro Abs: 6.6 10*3/uL (ref 1.7–7.7)
Neutrophils Relative %: 89 %
Platelets: 250 10*3/uL (ref 150–400)
RBC: 4.12 MIL/uL (ref 3.87–5.11)
RDW: 17 % — ABNORMAL HIGH (ref 11.5–15.5)
WBC: 7.4 10*3/uL (ref 4.0–10.5)
nRBC: 0 % (ref 0.0–0.2)

## 2020-11-06 LAB — BLOOD GAS, VENOUS
Acid-base deficit: 3 mmol/L — ABNORMAL HIGH (ref 0.0–2.0)
Bicarbonate: 23.2 mmol/L (ref 20.0–28.0)
O2 Saturation: 92.6 %
Patient temperature: 98.6
pCO2, Ven: 49.1 mmHg (ref 44.0–60.0)
pH, Ven: 7.295 (ref 7.250–7.430)
pO2, Ven: 70.5 mmHg — ABNORMAL HIGH (ref 32.0–45.0)

## 2020-11-06 LAB — COMPREHENSIVE METABOLIC PANEL
ALT: 11 U/L (ref 0–44)
AST: 20 U/L (ref 15–41)
Albumin: 3 g/dL — ABNORMAL LOW (ref 3.5–5.0)
Alkaline Phosphatase: 43 U/L (ref 38–126)
Anion gap: 11 (ref 5–15)
BUN: 17 mg/dL (ref 8–23)
CO2: 21 mmol/L — ABNORMAL LOW (ref 22–32)
Calcium: 8.4 mg/dL — ABNORMAL LOW (ref 8.9–10.3)
Chloride: 106 mmol/L (ref 98–111)
Creatinine, Ser: 1.06 mg/dL — ABNORMAL HIGH (ref 0.44–1.00)
GFR, Estimated: 49 mL/min — ABNORMAL LOW (ref >60)
Glucose, Bld: 161 mg/dL — ABNORMAL HIGH (ref 70–99)
Potassium: 4 mmol/L (ref 3.5–5.1)
Sodium: 138 mmol/L (ref 135–145)
Total Bilirubin: 0.8 mg/dL (ref 0.3–1.2)
Total Protein: 7.4 g/dL (ref 6.5–8.1)

## 2020-11-06 LAB — BRAIN NATRIURETIC PEPTIDE: B Natriuretic Peptide: 949.2 pg/mL — ABNORMAL HIGH (ref 0.0–100.0)

## 2020-11-06 LAB — PROCALCITONIN: Procalcitonin: 0.18 ng/mL

## 2020-11-06 LAB — C DIFFICILE QUICK SCREEN W PCR REFLEX
C Diff antigen: NEGATIVE
C Diff interpretation: NOT DETECTED
C Diff toxin: NEGATIVE

## 2020-11-06 LAB — STREP PNEUMONIAE URINARY ANTIGEN: Strep Pneumo Urinary Antigen: NEGATIVE

## 2020-11-06 LAB — MAGNESIUM: Magnesium: 2.2 mg/dL (ref 1.7–2.4)

## 2020-11-06 MED ORDER — SODIUM CHLORIDE 0.9 % IV SOLN
100.0000 mg | Freq: Two times a day (BID) | INTRAVENOUS | Status: DC
  Administered 2020-11-06 – 2020-11-07 (×3): 100 mg via INTRAVENOUS
  Filled 2020-11-06 (×4): qty 100

## 2020-11-06 MED ORDER — ASPIRIN EC 81 MG PO TBEC
81.0000 mg | DELAYED_RELEASE_TABLET | Freq: Every day | ORAL | Status: DC
  Administered 2020-11-07 – 2020-11-08 (×2): 81 mg via ORAL
  Filled 2020-11-06 (×4): qty 1

## 2020-11-06 MED ORDER — ONDANSETRON HCL 4 MG/2ML IJ SOLN
4.0000 mg | Freq: Four times a day (QID) | INTRAMUSCULAR | Status: DC | PRN
  Administered 2020-11-06: 4 mg via INTRAVENOUS
  Filled 2020-11-06: qty 2

## 2020-11-06 NOTE — Progress Notes (Signed)
Progress Note    Rebecca Elliott  TIR:[id number] DOB: [birth year]  DOA: 11/05/2020 PCP: Colon Branch, MD      Brief Narrative:    Medical records reviewed and are as summarized below:  Rebecca Elliott is a 87 y.o. female       Assessment/Plan:   Principal Problem:   Community acquired bilateral lower lobe pneumonia Active Problems:   Type 2 diabetes mellitus with diabetic neuropathy, unspecified ([place])   Essential hypertension   Hypokalemia   Hypomagnesemia   SOB (shortness of breath)   COPD with acute exacerbation (HCC)   Diarrhea   CKD (chronic kidney disease) stage 3, GFR 30-59 ml/min (HCC)   History of anemia due to CKD    Body mass index is 31.6 kg/m.  (Obesity)  Community-acquired pneumonia: Continue empiric IV antibiotics.  Follow-up blood cultures.  COPD exacerbation: Continue steroids and bronchodilators  Acute hypoxemic respiratory failure: Continue 2 L/min oxygen via nasal cannula.  Taper off oxygen as able.  Check 2D echo because of elevated BNP.   Hypokalemia and hypomagnesemia: Improved   Diarrhea: Improved.  Stool for C. difficile toxin was negative.   Diet Order             Diet regular Room service appropriate? Yes; Fluid consistency: Thin  Diet effective now                      Consultants: None  Procedures: None    Medications:    aspirin EC  81 mg Oral Daily   hydrALAZINE  25 mg Oral TID   insulin aspart  0-9 Units Subcutaneous TID WC   ipratropium-albuterol  3 mL Nebulization Q6H   irbesartan  150 mg Oral Daily   loratadine  5 mg Oral Daily   methylPREDNISolone (SOLU-MEDROL) injection  80 mg Intravenous Q12H   metoprolol succinate  100 mg Oral Daily   Continuous Infusions:  cefTRIAXone (ROCEPHIN)  IV 1 g (11/06/20 1748)   doxycycline (VIBRAMYCIN) IV 100 mg (11/06/20 1416)     Anti-infectives (From admission, onward)    Start     Dose/Rate Route Frequency Ordered Stop   11/06/20 1500  cefTRIAXone  (ROCEPHIN) 1 g in sodium chloride 0.9 % 100 mL IVPB        1 g 200 mL/hr over 30 Minutes Intravenous Every 24 hours 11/05/20 2125     11/06/20 1400  azithromycin (ZITHROMAX) 500 mg in sodium chloride 0.9 % 250 mL IVPB  Status:  Discontinued        500 mg 250 mL/hr over 60 Minutes Intravenous Every 24 hours 11/05/20 2125 11/06/20 1213   11/06/20 1300  doxycycline (VIBRAMYCIN) 100 mg in sodium chloride 0.9 % 250 mL IVPB        100 mg 125 mL/hr over 120 Minutes Intravenous 2 times daily 11/06/20 1213     11/05/20 1345  cefTRIAXone (ROCEPHIN) 1 g in sodium chloride 0.9 % 100 mL IVPB        1 g 200 mL/hr over 30 Minutes Intravenous  Once 11/05/20 1333 11/05/20 1558   11/05/20 1345  azithromycin (ZITHROMAX) 500 mg in sodium chloride 0.9 % 250 mL IVPB        500 mg 250 mL/hr over 60 Minutes Intravenous  Once 11/05/20 1333 11/05/20 1519              Family Communication/Anticipated D/C date and plan/Code Status   DVT prophylaxis: SCDs Start: 11/05/20 2109  Code Status: Full Code  Family Communication: Plan discussed with her daughter at the bedside Disposition Plan: Home in 2 to 3 days   Status is: Inpatient  Remains inpatient appropriate because: IV antibiotics           Subjective:   C/o cough and shortness of breath  Objective:    Vitals:   11/06/20 0047 11/06/20 0244 11/06/20 0445 11/06/20 1317  BP: (!) 161/60  (!) 167/76 (!) 147/52  Pulse: 85  97 91  Resp: 18  20 16   Temp: 98.2 F (36.8 C)   98.2 F (36.8 C)  TempSrc: Oral   Oral  SpO2: 97% 97% 92% 96%  Weight:      Height:       No data found.   Intake/Output Summary (Last 24 hours) at 11/06/2020 1812 Last data filed at 11/06/2020 1700 Gross per 24 hour  Intake 856.66 ml  Output --  Net 856.66 ml   Filed Weights   11/05/20 1047  Weight: 88.8 kg    Exam:  GEN: NAD SKIN: Warm and dry EYES: No pallor or icterus ENT: MMM CV: RRR PULM: Bibasilar rales.  No wheezing heard ABD:  soft, ND, NT, +BS CNS: AAO x 3, non focal EXT: No edema or tenderness        Data Reviewed:   I have personally reviewed following labs and imaging studies:  Labs: Labs show the following:   Basic Metabolic Panel: Recent Labs  Lab 11/05/20 1120 11/06/20 0413  NA 136 138  K 3.2* 4.0  CL 101 106  CO2 24 21*  GLUCOSE 112* 161*  BUN 19 17  CREATININE 1.40* 1.06*  CALCIUM 8.8* 8.4*  MG 1.6* 2.2  PHOS  --  4.1   GFR Estimated Creatinine Clearance: 38 mL/min (A) (by C-G formula based on SCr of 1.06 mg/dL (H)). Liver Function Tests: Recent Labs  Lab 11/05/20 1120 11/06/20 0413  AST 25 20  ALT 13 11  ALKPHOS 48 43  BILITOT 0.8 0.8  PROT 7.9 7.4  ALBUMIN 3.3* 3.0*   Recent Labs  Lab 11/05/20 1120  LIPASE 40   No results for input(s): AMMONIA in the last 168 hours. Coagulation profile No results for input(s): INR, PROTIME in the last 168 hours.  CBC: Recent Labs  Lab 11/05/20 1120 11/06/20 0413  WBC 9.7 7.4  NEUTROABS 6.4 6.6  HGB 11.5* 10.6*  HCT 37.0 35.9*  MCV 83.3 87.1  PLT 281 250   Cardiac Enzymes: No results for input(s): CKTOTAL, CKMB, CKMBINDEX, TROPONINI in the last 168 hours. BNP (last 3 results) No results for input(s): PROBNP in the last 8760 hours. CBG: Recent Labs  Lab 11/05/20 2220 11/06/20 0749 11/06/20 1141 11/06/20 1718  GLUCAP 99 189* 163* 206*   D-Dimer: No results for input(s): DDIMER in the last 72 hours. Hgb A1c: No results for input(s): HGBA1C in the last 72 hours. Lipid Profile: No results for input(s): CHOL, HDL, LDLCALC, TRIG, CHOLHDL, LDLDIRECT in the last 72 hours. Thyroid function studies: No results for input(s): TSH, T4TOTAL, T3FREE, THYROIDAB in the last 72 hours.  Invalid input(s): FREET3 Anemia work up: No results for input(s): VITAMINB12, FOLATE, FERRITIN, TIBC, IRON, RETICCTPCT in the last 72 hours. Sepsis Labs: Recent Labs  Lab 11/05/20 1120 11/05/20 2215 11/06/20 0413  PROCALCITON  --  0.18   --   WBC 9.7  --  7.4    Microbiology Recent Results (from the past 240 hour(s))  Resp Panel by RT-PCR (Flu  A&B, Covid) Nasopharyngeal Swab     Status: None   Collection Time: 11/05/20 11:20 AM   Specimen: Nasopharyngeal Swab; Nasopharyngeal(NP) swabs in vial transport medium  Result Value Ref Range Status   SARS Coronavirus 2 by RT PCR NEGATIVE NEGATIVE Final    Comment: (NOTE) SARS-CoV-2 target nucleic acids are NOT DETECTED.  The SARS-CoV-2 RNA is generally detectable in upper respiratory specimens during the acute phase of infection. The lowest concentration of SARS-CoV-2 viral copies this assay can detect is 138 copies/mL. A negative result does not preclude SARS-Cov-2 infection and should not be used as the sole basis for treatment or other patient management decisions. A negative result may occur with  improper specimen collection/handling, submission of specimen other than nasopharyngeal swab, presence of viral mutation(s) within the areas targeted by this assay, and inadequate number of viral copies(<138 copies/mL). A negative result must be combined with clinical observations, patient history, and epidemiological information. The expected result is Negative.  Fact Sheet for Patients:  EntrepreneurPulse.com.au  Fact Sheet for Healthcare Providers:  IncredibleEmployment.be  This test is no t yet approved or cleared by the [place] FDA and  has been authorized for detection and/or diagnosis of SARS-CoV-2 by FDA under an Emergency Use Authorization (EUA). This EUA will remain  in effect (meaning this test can be used) for the duration of the COVID-19 declaration under Section 564(b)(1) of the Act, 21 U.S.C.section 360bbb-3(b)(1), unless the authorization is terminated  or revoked sooner.       Influenza A by PCR NEGATIVE NEGATIVE Final   Influenza B by PCR NEGATIVE NEGATIVE Final    Comment: (NOTE) The Xpert Xpress  SARS-CoV-2/FLU/RSV plus assay is intended as an aid in the diagnosis of influenza from Nasopharyngeal swab specimens and should not be used as a sole basis for treatment. Nasal washings and aspirates are unacceptable for Xpert Xpress SARS-CoV-2/FLU/RSV testing.  Fact Sheet for Patients: EntrepreneurPulse.com.au  Fact Sheet for Healthcare Providers: IncredibleEmployment.be  This test is not yet approved or cleared by the [place] FDA and has been authorized for detection and/or diagnosis of SARS-CoV-2 by FDA under an Emergency Use Authorization (EUA). This EUA will remain in effect (meaning this test can be used) for the duration of the COVID-19 declaration under Section 564(b)(1) of the Act, 21 U.S.C. section 360bbb-3(b)(1), unless the authorization is terminated or revoked.  Performed at [facility], [address] [place], [place] [address]   Blood culture (routine x 2)     Status: None (Preliminary result)   Collection Time: 11/05/20  2:09 PM   Specimen: BLOOD RIGHT ARM  Result Value Ref Range Status   Specimen Description   Final    BLOOD RIGHT ARM Performed at [facility], [address] [place], [place] [address]    Special Requests   Final    BOTTLES DRAWN AEROBIC AND ANAEROBIC Blood Culture adequate volume Performed at [facility], [address] [place], [place] [address]    Culture   Final    NO GROWTH < 24 HOURS Performed at [facility], [place] [address] [place], [place] [address]    Report Status PENDING  Incomplete  Blood culture (routine x 2)     Status: None (Preliminary result)   Collection Time: 11/05/20  2:09 PM   Specimen: Left Antecubital; Blood  Result Value Ref Range Status   Specimen Description   Final    LEFT ANTECUBITAL Performed at [facility], [address] [place], [place] [address]    Special Requests   Final    BOTTLES DRAWN AEROBIC AND  ANAEROBIC Blood Culture adequate volume Performed at [facility], [address] [place], [place] [address]    Culture   Final    NO GROWTH < 24 HOURS Performed at [facility], [place] [address] [place], [place] [address]    Report Status PENDING  Incomplete    Procedures and diagnostic studies:  CT ABDOMEN PELVIS WO CONTRAST  Result Date: 11/05/2020 CLINICAL DATA:  Abdominal pain, acute, nonlocalized. Diarrhea. Shortness of breath. EXAM: CT ABDOMEN AND PELVIS WITHOUT CONTRAST TECHNIQUE: Multidetector CT imaging of the abdomen and pelvis was performed following the standard protocol without IV contrast. COMPARISON:  08/06/2014 FINDINGS: Lower chest: Pleural effusion on the right layering dependently. Atelectasis and or infiltrate at both lung bases, right more than left. Hepatobiliary: Previous cholecystectomy. Liver parenchyma appears normal. Pancreas: Normal Spleen: Normal Adrenals/Urinary Tract: Adrenal glands are normal. Renal cysts on the right. No evidence of stone or hydronephrosis. Bladder is normal. Stomach/Bowel: Stomach and small intestine are unremarkable. Extensive diverticulosis of the colon without visible diverticulitis. Vascular/Lymphatic: Aortic atherosclerosis. No aneurysm. IVC is normal. No adenopathy. Reproductive: Previous hysterectomy.  No pelvic mass. Other: No free fluid or air. Musculoskeletal: Chronic degenerative changes throughout the lumbar region. Chronic osteitis pubis with fusion. Bilateral sacroiliac osteoarthritis. IMPRESSION: No acute intra-abdominal finding. Extensive diverticulosis but without definite diverticulitis. Low level diverticulitis can be inapparent at imaging. Right pleural effusion. Bilateral lower lobe atelectasis/infiltrate. Previous cholecystectomy. Aortic Atherosclerosis (ICD10-I70.0). Electronically Signed   By: Nelson Chimes M.D.   On: 11/05/2020 12:06   DG Chest Port 1 View  Result Date: 11/05/2020 CLINICAL DATA:   Shortness of breath EXAM: PORTABLE CHEST 1 VIEW COMPARISON:  08/06/2020 FINDINGS: Lordotic position film. Under penetration. Cardiomegaly and aortic atherosclerosis again seen. The upper lungs are clear. Question bilateral lower lobe infiltrates and or bilateral pleural effusions with volume loss. Two-view chest radiography recommended when able. IMPRESSION: Technical issues as above. Question bilateral lower lobe pneumonia and or bilateral effusions and atelectasis. Two-view chest radiography recommended when able. Electronically Signed   By: Nelson Chimes M.D.   On: 11/05/2020 12:02   ECHOCARDIOGRAM COMPLETE  Result Date: 11/06/2020    ECHOCARDIOGRAM REPORT   Patient Name:   ELDORA NAPP Date of Exam: 11/06/2020 Medical Rec #:  [id number]      Height:       66.0 in Accession #:    [id number]     Weight:       195.8 lb Date of Birth:  10-Dec-1926      BSA:          1.982 m Patient Age:    70 years       BP:           167/76 mmHg Patient Gender: F              HR:           85 bpm. Exam Location:  Inpatient Procedure: 2D Echo, Cardiac Doppler and Color Doppler Indications:    SOB  History:        Patient has no prior history of Echocardiogram examinations.                 Risk Factors:Diabetes and Hypertension.  Sonographer:    Helmut Muster Referring Phys: [place]  1. Left ventricular  ejection fraction, by estimation, is 55 to 60%. The left ventricle has normal function. The left ventricle has no regional wall motion abnormalities. There is mild left ventricular hypertrophy. Left ventricular diastolic parameters are consistent with Grade II diastolic dysfunction (pseudonormalization).  2. Right ventricular systolic function is normal. The right ventricular size is normal.  3. Left atrial size was moderately dilated.  4. The mitral valve is normal in structure. Mild mitral valve regurgitation. No evidence of mitral stenosis. Moderate mitral annular calcification.  5. The aortic valve  is tricuspid. There is mild calcification of the aortic valve. Aortic valve regurgitation is mild. Mild aortic valve stenosis.  6. The inferior vena cava is dilated in size with >50% respiratory variability, suggesting right atrial pressure of 8 mmHg. FINDINGS  Left Ventricle: Left ventricular ejection fraction, by estimation, is 55 to 60%. The left ventricle has normal function. The left ventricle has no regional wall motion abnormalities. The left ventricular internal cavity size was normal in size. There is  mild left ventricular hypertrophy. Left ventricular diastolic parameters are consistent with Grade II diastolic dysfunction (pseudonormalization). Right Ventricle: The right ventricular size is normal. No increase in right ventricular wall thickness. Right ventricular systolic function is normal. Left Atrium: Left atrial size was moderately dilated. Right Atrium: Right atrial size was normal in size. Pericardium: There is no evidence of pericardial effusion. Mitral Valve: The mitral valve is normal in structure. There is mild calcification of the mitral valve leaflet(s). Moderate mitral annular calcification. Mild mitral valve regurgitation. No evidence of mitral valve stenosis. MV peak gradient, 9.5 mmHg. The mean mitral valve gradient is 5.0 mmHg. Tricuspid Valve: The tricuspid valve is normal in structure. Tricuspid valve regurgitation is not demonstrated. No evidence of tricuspid stenosis. Aortic Valve: The aortic valve is tricuspid. There is mild calcification of the aortic valve. Aortic valve regurgitation is mild. Aortic regurgitation PHT measures 550 msec. Mild aortic stenosis is present. Aortic valve mean gradient measures 12.0 mmHg. Aortic valve peak gradient measures 22.3 mmHg. Aortic valve area, by VTI measures 2.32 cm. Pulmonic Valve: The pulmonic valve was grossly normal. Pulmonic valve regurgitation is not visualized. No evidence of pulmonic stenosis. Aorta: The aortic root is normal in size  and structure. Venous: The inferior vena cava is dilated in size with greater than 50% respiratory variability, suggesting right atrial pressure of 8 mmHg. IAS/Shunts: The interatrial septum appears to be lipomatous. No atrial level shunt detected by color flow Doppler.  LEFT VENTRICLE PLAX 2D LVIDd:         6.10 cm      Diastology LVIDs:         4.10 cm      LV e' medial:    5.00 cm/s LV PW:         1.30 cm      LV E/e' medial:  27.4 LV IVS:        1.30 cm      LV e' lateral:   5.22 cm/s LVOT diam:     2.30 cm      LV E/e' lateral: 26.2 LV SV:         102 LV SV Index:   51 LVOT Area:     4.15 cm  LV Volumes (MOD) LV vol d, MOD A2C: 120.0 ml LV vol d, MOD A4C: 77.5 ml LV vol s, MOD A2C: 58.3 ml LV vol s, MOD A4C: 34.0 ml LV SV MOD A2C:     61.7 ml LV SV MOD  A4C:     77.5 ml LV SV MOD BP:      51.0 ml RIGHT VENTRICLE             IVC RV S prime:     14.90 cm/s  IVC diam: 2.50 cm TAPSE (M-mode): 2.4 cm LEFT ATRIUM              Index        RIGHT ATRIUM           Index LA diam:        4.00 cm  2.02 cm/m   RA Area:     13.30 cm LA Vol (A2C):   80.3 ml  40.52 ml/m  RA Volume:   25.80 ml  13.02 ml/m LA Vol (A4C):   106.0 ml 53.49 ml/m LA Biplane Vol: 98.7 ml  49.80 ml/m  AORTIC VALVE AV Area (Vmax):    2.18 cm AV Area (Vmean):   2.18 cm AV Area (VTI):     2.32 cm AV Vmax:           236.00 cm/s AV Vmean:          163.000 cm/s AV VTI:            0.438 m AV Peak Grad:      22.3 mmHg AV Mean Grad:      12.0 mmHg LVOT Vmax:         124.00 cm/s LVOT Vmean:        85.500 cm/s LVOT VTI:          0.245 m LVOT/AV VTI ratio: 0.56 AI PHT:            550 msec  AORTA Ao Root diam: 3.40 cm Ao Asc diam:  3.80 cm MITRAL VALVE                TRICUSPID VALVE MV Area (PHT): 3.99 cm     TR Peak grad:   30.5 mmHg MV Area VTI:   3.05 cm     TR Vmax:        276.00 cm/s MV Peak grad:  9.5 mmHg MV Mean grad:  5.0 mmHg     SHUNTS MV Vmax:       1.54 m/s     Systemic VTI:  0.24 m MV Vmean:      105.0 cm/s   Systemic Diam: 2.30 cm MV Decel  Time: 190 msec MV E velocity: 137.00 cm/s MV A velocity: 113.00 cm/s MV E/A ratio:  1.21 Glori Bickers MD Electronically signed by Glori Bickers MD Signature Date/Time: 11/06/2020/4:39:44 PM    Final                LOS: 1 day   Maytte Jacot  [facility]   Pager on [email or web address]. If 7PM-7AM, please contact night-coverage at [email or web address]     11/06/2020, 6:12 PM

## 2020-11-07 DIAGNOSIS — J189 Pneumonia, unspecified organism: Secondary | ICD-10-CM | POA: Diagnosis not present

## 2020-11-07 DIAGNOSIS — N1832 Chronic kidney disease, stage 3b: Secondary | ICD-10-CM | POA: Diagnosis not present

## 2020-11-07 DIAGNOSIS — J441 Chronic obstructive pulmonary disease with (acute) exacerbation: Secondary | ICD-10-CM | POA: Diagnosis not present

## 2020-11-07 LAB — GASTROINTESTINAL PANEL BY PCR, STOOL (REPLACES STOOL CULTURE)

## 2020-11-07 LAB — GLUCOSE, CAPILLARY
Glucose-Capillary: 174 mg/dL — ABNORMAL HIGH (ref 70–99)
Glucose-Capillary: 226 mg/dL — ABNORMAL HIGH (ref 70–99)
Glucose-Capillary: 175 mg/dL — ABNORMAL HIGH (ref 70–99)
Glucose-Capillary: 184 mg/dL — ABNORMAL HIGH (ref 70–99)

## 2020-11-07 LAB — LEGIONELLA PNEUMOPHILA SEROGP 1 UR AG: L. pneumophila Serogp 1 Ur Ag: NEGATIVE

## 2020-11-07 LAB — LACTOFERRIN, FECAL, QUALITATIVE: Lactoferrin, Fecal, Qual: POSITIVE — AB

## 2020-11-07 MED ORDER — GUAIFENESIN-DM 100-10 MG/5ML PO SYRP
5.0000 mL | ORAL_SOLUTION | ORAL | Status: DC | PRN
  Administered 2020-11-07 – 2020-11-09 (×3): 5 mL via ORAL
  Filled 2020-11-07 (×3): qty 10

## 2020-11-07 MED ORDER — FUROSEMIDE 10 MG/ML IJ SOLN
40.0000 mg | Freq: Once | INTRAMUSCULAR | Status: AC
  Administered 2020-11-07: 40 mg via INTRAVENOUS
  Filled 2020-11-07: qty 4

## 2020-11-07 MED ORDER — HYDROCHLOROTHIAZIDE 12.5 MG PO TABS
12.5000 mg | ORAL_TABLET | Freq: Every day | ORAL | Status: DC
  Administered 2020-11-07 – 2020-11-11 (×5): 12.5 mg via ORAL
  Filled 2020-11-07 (×5): qty 1

## 2020-11-07 MED ORDER — DOXYCYCLINE HYCLATE 100 MG PO TABS
100.0000 mg | ORAL_TABLET | Freq: Two times a day (BID) | ORAL | Status: DC
  Administered 2020-11-07 – 2020-11-09 (×4): 100 mg via ORAL
  Filled 2020-11-07 (×4): qty 1

## 2020-11-07 MED ORDER — AMLODIPINE BESYLATE 10 MG PO TABS
10.0000 mg | ORAL_TABLET | Freq: Every day | ORAL | Status: DC
  Administered 2020-11-07 – 2020-11-12 (×6): 10 mg via ORAL
  Filled 2020-11-07 (×6): qty 1

## 2020-11-07 MED ORDER — IPRATROPIUM-ALBUTEROL 0.5-2.5 (3) MG/3ML IN SOLN
3.0000 mL | Freq: Three times a day (TID) | RESPIRATORY_TRACT | Status: DC
  Administered 2020-11-07 – 2020-11-09 (×5): 3 mL via RESPIRATORY_TRACT
  Filled 2020-11-07 (×7): qty 3

## 2020-11-07 NOTE — Progress Notes (Addendum)
Progress Note    Rebecca Elliott  WJX:[id number] DOB: Mar 12, 1926  DOA: 11/05/2020 PCP: Colon Branch, MD      Brief Narrative:    Medical records reviewed and are as summarized below:  Rebecca Elliott is a 87 y.o. female with medical history significant for type II DM, hypertension, hyperlipidemia, COPD, CAD, CKD stage IIIb, right knee osteoarthritis, chronic anemia, who presented to the hospital because of shortness of breath, abdominal pain and diarrhea.  She was found to have community-acquired pneumonia, COPD exacerbation and acute on chronic diastolic CHF.  She was treated with empiric IV antibiotics, steroids, bronchodilators and IV Lasix.  Stool for C. difficile toxin was negative.    Assessment/Plan:   Principal Problem:   Community acquired bilateral lower lobe pneumonia Active Problems:   Type 2 diabetes mellitus with diabetic neuropathy, unspecified (HCC)   Essential hypertension   Hypokalemia   Hypomagnesemia   SOB (shortness of breath)   COPD with acute exacerbation (HCC)   Diarrhea   CKD (chronic kidney disease) stage 3, GFR 30-59 ml/min (HCC)   History of anemia due to CKD    Body mass index is 34.05 kg/m.  (Obesity)  Community-acquired pneumonia: Continue empiric antibiotics.  No growth on blood cultures thus far.  COPD exacerbation: Continue steroids and bronchodilators  Right pleural effusion, probable acute on chronic diastolic CHF: Treat with IV Lasix (1 dose).  2D echo showed normal EF and grade 2 diastolic dysfunction.  Acute hypoxemic respiratory failure: Oxygen saturation was in the mid 80s according to [place], [place], when she tried to wean her off oxygen.  Continue 2 L/min oxygen via nasal cannula.  Taper off oxygen as able.   Hypokalemia and hypomagnesemia: Improved   Hypertension: BP is well controlled.  Continue antihypertensives.  Resume home amlodipine and HCTZ  Diarrhea: Improved.  Stool for C. difficile toxin was  negative.  Debility: PT evaluation   Diet Order             Diet regular Room service appropriate? Yes; Fluid consistency: Thin  Diet effective now                      Consultants: None  Procedures: None    Medications:    amLODipine  10 mg Oral Daily   aspirin EC  81 mg Oral Daily   doxycycline  100 mg Oral Q12H   hydrALAZINE  25 mg Oral TID   hydrochlorothiazide  12.5 mg Oral Daily   insulin aspart  0-9 Units Subcutaneous TID WC   ipratropium-albuterol  3 mL Nebulization TID   irbesartan  150 mg Oral Daily   loratadine  5 mg Oral Daily   methylPREDNISolone (SOLU-MEDROL) injection  80 mg Intravenous Q12H   metoprolol succinate  100 mg Oral Daily   Continuous Infusions:  cefTRIAXone (ROCEPHIN)  IV Stopped (11/06/20 1854)     Anti-infectives (From admission, onward)    Start     Dose/Rate Route Frequency Ordered Stop   11/07/20 2200  doxycycline (VIBRA-TABS) tablet 100 mg        100 mg Oral Every 12 hours 11/07/20 1233     11/06/20 1500  cefTRIAXone (ROCEPHIN) 1 g in sodium chloride 0.9 % 100 mL IVPB        1 g 200 mL/hr over 30 Minutes Intravenous Every 24 hours 11/05/20 2125     11/06/20 1400  azithromycin (ZITHROMAX) 500 mg in sodium chloride 0.9 % 250 mL  IVPB  Status:  Discontinued        500 mg 250 mL/hr over 60 Minutes Intravenous Every 24 hours 11/05/20 2125 11/06/20 1213   11/06/20 1300  doxycycline (VIBRAMYCIN) 100 mg in sodium chloride 0.9 % 250 mL IVPB  Status:  Discontinued        100 mg 125 mL/hr over 120 Minutes Intravenous 2 times daily 11/06/20 1213 11/07/20 1233   11/05/20 1345  cefTRIAXone (ROCEPHIN) 1 g in sodium chloride 0.9 % 100 mL IVPB        1 g 200 mL/hr over 30 Minutes Intravenous  Once 11/05/20 1333 11/05/20 1558   11/05/20 1345  azithromycin (ZITHROMAX) 500 mg in sodium chloride 0.9 % 250 mL IVPB        500 mg 250 mL/hr over 60 Minutes Intravenous  Once 11/05/20 1333 11/05/20 1519              Family  Communication/Anticipated D/C date and plan/Code Status   DVT prophylaxis: SCDs Start: 11/05/20 2109     Code Status: Full Code  Family Communication: Plan discussed with her daughter at the bedside Disposition Plan: Home in 2 to 3 days   Status is: Inpatient  Remains inpatient appropriate because: IV antibiotics           Subjective:   Interval events noted.  She complains of cough and shortness of breath.  Diarrhea has improved.  Her daughter was at the bedside.  Objective:    Vitals:   11/07/20 0440 11/07/20 0500 11/07/20 0748 11/07/20 1107  BP: (!) 174/72   (!) 164/72  Pulse: 74   72  Resp: 20     Temp: 97.7 F (36.5 C)     TempSrc: Oral     SpO2: 97%  98%   Weight:  95.7 kg    Height:       No data found.   Intake/Output Summary (Last 24 hours) at 11/07/2020 1347 Last data filed at 11/07/2020 0222 Gross per 24 hour  Intake 820.08 ml  Output --  Net 820.08 ml   Filed Weights   11/05/20 1047 11/07/20 0500  Weight: 88.8 kg 95.7 kg    Exam:  GEN: NAD SKIN: Warm and dry EYES: EOMI ENT: MMM CV: RRR PULM: Bibasilar rales.  No wheezing heard ABD: soft, ND, NT, +BS CNS: AAO x 3, non focal EXT: No edema or tenderness        Data Reviewed:   I have personally reviewed following labs and imaging studies:  Labs: Labs show the following:   Basic Metabolic Panel: Recent Labs  Lab 11/05/20 1120 11/06/20 0413  NA 136 138  K 3.2* 4.0  CL 101 106  CO2 24 21*  GLUCOSE 112* 161*  BUN 19 17  CREATININE 1.40* 1.06*  CALCIUM 8.8* 8.4*  MG 1.6* 2.2  PHOS  --  4.1   GFR Estimated Creatinine Clearance: 39.5 mL/min (A) (by C-G formula based on SCr of 1.06 mg/dL (H)). Liver Function Tests: Recent Labs  Lab 11/05/20 1120 11/06/20 0413  AST 25 20  ALT 13 11  ALKPHOS 48 43  BILITOT 0.8 0.8  PROT 7.9 7.4  ALBUMIN 3.3* 3.0*   Recent Labs  Lab 11/05/20 1120  LIPASE 40   No results for input(s): AMMONIA in the last 168  hours. Coagulation profile No results for input(s): INR, PROTIME in the last 168 hours.  CBC: Recent Labs  Lab 11/05/20 1120 11/06/20 0413  WBC 9.7 7.4  NEUTROABS  6.4 6.6  HGB 11.5* 10.6*  HCT 37.0 35.9*  MCV 83.3 87.1  PLT 281 250   Cardiac Enzymes: No results for input(s): CKTOTAL, CKMB, CKMBINDEX, TROPONINI in the last 168 hours. BNP (last 3 results) No results for input(s): PROBNP in the last 8760 hours. CBG: Recent Labs  Lab 11/06/20 1141 11/06/20 1718 11/06/20 2025 11/07/20 0750 11/07/20 1135  GLUCAP 163* 206* 201* 174* 226*   D-Dimer: No results for input(s): DDIMER in the last 72 hours. Hgb A1c: No results for input(s): HGBA1C in the last 72 hours. Lipid Profile: No results for input(s): CHOL, HDL, LDLCALC, TRIG, CHOLHDL, LDLDIRECT in the last 72 hours. Thyroid function studies: No results for input(s): TSH, T4TOTAL, T3FREE, THYROIDAB in the last 72 hours.  Invalid input(s): FREET3 Anemia work up: No results for input(s): VITAMINB12, FOLATE, FERRITIN, TIBC, IRON, RETICCTPCT in the last 72 hours. Sepsis Labs: Recent Labs  Lab 11/05/20 1120 11/05/20 2215 11/06/20 0413  PROCALCITON  --  0.18  --   WBC 9.7  --  7.4    Microbiology Recent Results (from the past 240 hour(s))  Resp Panel by RT-PCR (Flu A&B, Covid) Nasopharyngeal Swab     Status: None   Collection Time: 11/05/20 11:20 AM   Specimen: Nasopharyngeal Swab; Nasopharyngeal(NP) swabs in vial transport medium  Result Value Ref Range Status   SARS Coronavirus 2 by RT PCR NEGATIVE NEGATIVE Final    Comment: (NOTE) SARS-CoV-2 target nucleic acids are NOT DETECTED.  The SARS-CoV-2 RNA is generally detectable in upper respiratory specimens during the acute phase of infection. The lowest concentration of SARS-CoV-2 viral copies this assay can detect is 138 copies/mL. A negative result does not preclude SARS-Cov-2 infection and should not be used as the sole basis for treatment or other patient  management decisions. A negative result may occur with  improper specimen collection/handling, submission of specimen other than nasopharyngeal swab, presence of viral mutation(s) within the areas targeted by this assay, and inadequate number of viral copies(<138 copies/mL). A negative result must be combined with clinical observations, patient history, and epidemiological information. The expected result is Negative.  Fact Sheet for Patients:  EntrepreneurPulse.com.au  Fact Sheet for Healthcare Providers:  IncredibleEmployment.be  This test is no t yet approved or cleared by the [place] FDA and  has been authorized for detection and/or diagnosis of SARS-CoV-2 by FDA under an Emergency Use Authorization (EUA). This EUA will remain  in effect (meaning this test can be used) for the duration of the COVID-19 declaration under Section 564(b)(1) of the Act, 21 U.S.C.section 360bbb-3(b)(1), unless the authorization is terminated  or revoked sooner.       Influenza A by PCR NEGATIVE NEGATIVE Final   Influenza B by PCR NEGATIVE NEGATIVE Final    Comment: (NOTE) The Xpert Xpress SARS-CoV-2/FLU/RSV plus assay is intended as an aid in the diagnosis of influenza from Nasopharyngeal swab specimens and should not be used as a sole basis for treatment. Nasal washings and aspirates are unacceptable for Xpert Xpress SARS-CoV-2/FLU/RSV testing.  Fact Sheet for Patients: EntrepreneurPulse.com.au  Fact Sheet for Healthcare Providers: IncredibleEmployment.be  This test is not yet approved or cleared by the [place] FDA and has been authorized for detection and/or diagnosis of SARS-CoV-2 by FDA under an Emergency Use Authorization (EUA). This EUA will remain in effect (meaning this test can be used) for the duration of the COVID-19 declaration under Section 564(b)(1) of the Act, 21 U.S.C. section 360bbb-3(b)(1),  unless the authorization is terminated or  revoked.  Performed at [facility], [address] [place], [place] [address]   Blood culture (routine x 2)     Status: None (Preliminary result)   Collection Time: 11/05/20  2:09 PM   Specimen: BLOOD RIGHT ARM  Result Value Ref Range Status   Specimen Description   Final    BLOOD RIGHT ARM Performed at [facility], [address] [place], [place] [address]    Special Requests   Final    BOTTLES DRAWN AEROBIC AND ANAEROBIC Blood Culture adequate volume Performed at [facility], [address] [place], [place] [address]    Culture   Final    NO GROWTH 2 DAYS Performed at [facility], [place] [address] [place], [place] [address]    Report Status PENDING  Incomplete  Blood culture (routine x 2)     Status: None (Preliminary result)   Collection Time: 11/05/20  2:09 PM   Specimen: Left Antecubital; Blood  Result Value Ref Range Status   Specimen Description   Final    LEFT ANTECUBITAL Performed at [facility], [address] [place], [place] [address]    Special Requests   Final    BOTTLES DRAWN AEROBIC AND ANAEROBIC Blood Culture adequate volume Performed at [facility], [address] [place], [place] [address]    Culture   Final    NO GROWTH 2 DAYS Performed at [facility], [place] [address] [place], [place] [address]    Report Status PENDING  Incomplete  C Difficile Quick Screen w PCR reflex     Status: None   Collection Time: 11/06/20  2:46 PM   Specimen: STOOL  Result Value Ref Range Status   C Diff antigen NEGATIVE NEGATIVE Final   C Diff toxin NEGATIVE NEGATIVE Final   C Diff interpretation No C. difficile detected.  Final    Comment: Performed at [facility], [place] [address] [place], [place] [address]    Procedures and diagnostic studies:  ECHOCARDIOGRAM COMPLETE  Result Date: 11/06/2020    ECHOCARDIOGRAM REPORT   Patient  Name:   LEONA ALEN Date of Exam: 11/06/2020 Medical Rec #:  [id number]      Height:       66.0 in Accession #:    [id number]     Weight:       195.8 lb Date of Birth:  March 23, 1926      BSA:          1.982 m Patient Age:    10 years       BP:           167/76 mmHg Patient Gender: F              HR:           85 bpm. Exam Location:  Inpatient Procedure: 2D Echo, Cardiac Doppler and Color Doppler Indications:    SOB  History:        Patient has no prior history of Echocardiogram examinations.                 Risk Factors:Diabetes and Hypertension.  Sonographer:    Helmut Muster Referring Phys: [place]  1. Left ventricular ejection fraction, by estimation, is 55 to 60%. The left ventricle has normal function. The left ventricle has no regional wall motion abnormalities. There is mild left ventricular hypertrophy.  Left ventricular diastolic parameters are consistent with Grade II diastolic dysfunction (pseudonormalization).  2. Right ventricular systolic function is normal. The right ventricular size is normal.  3. Left atrial size was moderately dilated.  4. The mitral valve is normal in structure. Mild mitral valve regurgitation. No evidence of mitral stenosis. Moderate mitral annular calcification.  5. The aortic valve is tricuspid. There is mild calcification of the aortic valve. Aortic valve regurgitation is mild. Mild aortic valve stenosis.  6. The inferior vena cava is dilated in size with >50% respiratory variability, suggesting right atrial pressure of 8 mmHg. FINDINGS  Left Ventricle: Left ventricular ejection fraction, by estimation, is 55 to 60%. The left ventricle has normal function. The left ventricle has no regional wall motion abnormalities. The left ventricular internal cavity size was normal in size. There is  mild left ventricular hypertrophy. Left ventricular diastolic parameters are consistent with Grade II diastolic dysfunction (pseudonormalization). Right Ventricle:  The right ventricular size is normal. No increase in right ventricular wall thickness. Right ventricular systolic function is normal. Left Atrium: Left atrial size was moderately dilated. Right Atrium: Right atrial size was normal in size. Pericardium: There is no evidence of pericardial effusion. Mitral Valve: The mitral valve is normal in structure. There is mild calcification of the mitral valve leaflet(s). Moderate mitral annular calcification. Mild mitral valve regurgitation. No evidence of mitral valve stenosis. MV peak gradient, 9.5 mmHg. The mean mitral valve gradient is 5.0 mmHg. Tricuspid Valve: The tricuspid valve is normal in structure. Tricuspid valve regurgitation is not demonstrated. No evidence of tricuspid stenosis. Aortic Valve: The aortic valve is tricuspid. There is mild calcification of the aortic valve. Aortic valve regurgitation is mild. Aortic regurgitation PHT measures 550 msec. Mild aortic stenosis is present. Aortic valve mean gradient measures 12.0 mmHg. Aortic valve peak gradient measures 22.3 mmHg. Aortic valve area, by VTI measures 2.32 cm. Pulmonic Valve: The pulmonic valve was grossly normal. Pulmonic valve regurgitation is not visualized. No evidence of pulmonic stenosis. Aorta: The aortic root is normal in size and structure. Venous: The inferior vena cava is dilated in size with greater than 50% respiratory variability, suggesting right atrial pressure of 8 mmHg. IAS/Shunts: The interatrial septum appears to be lipomatous. No atrial level shunt detected by color flow Doppler.  LEFT VENTRICLE PLAX 2D LVIDd:         6.10 cm      Diastology LVIDs:         4.10 cm      LV e' medial:    5.00 cm/s LV PW:         1.30 cm      LV E/e' medial:  27.4 LV IVS:        1.30 cm      LV e' lateral:   5.22 cm/s LVOT diam:     2.30 cm      LV E/e' lateral: 26.2 LV SV:         102 LV SV Index:   51 LVOT Area:     4.15 cm  LV Volumes (MOD) LV vol d, MOD A2C: 120.0 ml LV vol d, MOD A4C: 77.5 ml LV  vol s, MOD A2C: 58.3 ml LV vol s, MOD A4C: 34.0 ml LV SV MOD A2C:     61.7 ml LV SV MOD A4C:     77.5 ml LV SV MOD BP:      51.0 ml RIGHT VENTRICLE  IVC RV S prime:     14.90 cm/s  IVC diam: 2.50 cm TAPSE (M-mode): 2.4 cm LEFT ATRIUM              Index        RIGHT ATRIUM           Index LA diam:        4.00 cm  2.02 cm/m   RA Area:     13.30 cm LA Vol (A2C):   80.3 ml  40.52 ml/m  RA Volume:   25.80 ml  13.02 ml/m LA Vol (A4C):   106.0 ml 53.49 ml/m LA Biplane Vol: 98.7 ml  49.80 ml/m  AORTIC VALVE AV Area (Vmax):    2.18 cm AV Area (Vmean):   2.18 cm AV Area (VTI):     2.32 cm AV Vmax:           236.00 cm/s AV Vmean:          163.000 cm/s AV VTI:            0.438 m AV Peak Grad:      22.3 mmHg AV Mean Grad:      12.0 mmHg LVOT Vmax:         124.00 cm/s LVOT Vmean:        85.500 cm/s LVOT VTI:          0.245 m LVOT/AV VTI ratio: 0.56 AI PHT:            550 msec  AORTA Ao Root diam: 3.40 cm Ao Asc diam:  3.80 cm MITRAL VALVE                TRICUSPID VALVE MV Area (PHT): 3.99 cm     TR Peak grad:   30.5 mmHg MV Area VTI:   3.05 cm     TR Vmax:        276.00 cm/s MV Peak grad:  9.5 mmHg MV Mean grad:  5.0 mmHg     SHUNTS MV Vmax:       1.54 m/s     Systemic VTI:  0.24 m MV Vmean:      105.0 cm/s   Systemic Diam: 2.30 cm MV Decel Time: 190 msec MV E velocity: 137.00 cm/s MV A velocity: 113.00 cm/s MV E/A ratio:  1.21 Glori Bickers MD Electronically signed by Glori Bickers MD Signature Date/Time: 11/06/2020/4:39:44 PM    Final                LOS: 2 days   Antionetta Ator  [facility]   Pager on [email or web address]. If 7PM-7AM, please contact night-coverage at [email or web address]     11/07/2020, 1:47 PM

## 2020-11-07 NOTE — Care Management Important Message (Signed)
Important Message  Patient Details IM Letter placed in Patients room. Name: Rebecca Elliott MRN: [id number] Date of Birth: 1926-12-18   Medicare Important Message Given:  Yes     Kerin Salen 11/07/2020, 11:43 AM

## 2020-11-07 NOTE — Progress Notes (Signed)
   11/07/20 1400  Mobility  Activity Contraindicated/medical hold   Spoke with RN about mobilizing pt. RN states pt was experiencing a "jerky" feeling in her chest and notified MD. Recommended to hold mobility today. Will check back tomorrow as schedule permits.    [place] Specialist Acute Rehab Services Office: [phone]

## 2020-11-08 DIAGNOSIS — J189 Pneumonia, unspecified organism: Secondary | ICD-10-CM | POA: Diagnosis not present

## 2020-11-08 DIAGNOSIS — I509 Heart failure, unspecified: Secondary | ICD-10-CM | POA: Diagnosis present

## 2020-11-08 DIAGNOSIS — J9621 Acute and chronic respiratory failure with hypoxia: Secondary | ICD-10-CM | POA: Diagnosis not present

## 2020-11-08 DIAGNOSIS — N1832 Chronic kidney disease, stage 3b: Secondary | ICD-10-CM | POA: Diagnosis not present

## 2020-11-08 DIAGNOSIS — I5033 Acute on chronic diastolic (congestive) heart failure: Secondary | ICD-10-CM

## 2020-11-08 DIAGNOSIS — I10 Essential (primary) hypertension: Secondary | ICD-10-CM | POA: Diagnosis not present

## 2020-11-08 DIAGNOSIS — E669 Obesity, unspecified: Secondary | ICD-10-CM | POA: Diagnosis present

## 2020-11-08 LAB — BASIC METABOLIC PANEL
Anion gap: 6 (ref 5–15)
BUN: 36 mg/dL — ABNORMAL HIGH (ref 8–23)
CO2: 26 mmol/L (ref 22–32)
Calcium: 8.5 mg/dL — ABNORMAL LOW (ref 8.9–10.3)
Chloride: 103 mmol/L (ref 98–111)
Creatinine, Ser: 1.53 mg/dL — ABNORMAL HIGH (ref 0.44–1.00)
GFR, Estimated: 32 mL/min — ABNORMAL LOW (ref >60)
Glucose, Bld: 199 mg/dL — ABNORMAL HIGH (ref 70–99)
Potassium: 3.6 mmol/L (ref 3.5–5.1)
Sodium: 135 mmol/L (ref 135–145)

## 2020-11-08 LAB — GLUCOSE, CAPILLARY
Glucose-Capillary: 195 mg/dL — ABNORMAL HIGH (ref 70–99)
Glucose-Capillary: 94 mg/dL (ref 70–99)
Glucose-Capillary: 265 mg/dL — ABNORMAL HIGH (ref 70–99)
Glucose-Capillary: 227 mg/dL — ABNORMAL HIGH (ref 70–99)

## 2020-11-08 LAB — CBC
HCT: 39.4 % (ref 36.0–46.0)
Hemoglobin: 11.5 g/dL — ABNORMAL LOW (ref 12.0–15.0)
MCH: 25.5 pg — ABNORMAL LOW (ref 26.0–34.0)
MCHC: 29.2 g/dL — ABNORMAL LOW (ref 30.0–36.0)
MCV: 87.4 fL (ref 80.0–100.0)
Platelets: 290 10*3/uL (ref 150–400)
RBC: 4.51 MIL/uL (ref 3.87–5.11)
RDW: 17.1 % — ABNORMAL HIGH (ref 11.5–15.5)
WBC: 9.6 10*3/uL (ref 4.0–10.5)
nRBC: 0 % (ref 0.0–0.2)

## 2020-11-08 LAB — BRAIN NATRIURETIC PEPTIDE: B Natriuretic Peptide: 1774.3 pg/mL — ABNORMAL HIGH (ref 0.0–100.0)

## 2020-11-08 LAB — PROCALCITONIN: Procalcitonin: 0.19 ng/mL

## 2020-11-08 MED ORDER — FUROSEMIDE 10 MG/ML IJ SOLN
40.0000 mg | Freq: Two times a day (BID) | INTRAMUSCULAR | Status: DC
  Administered 2020-11-08 – 2020-11-11 (×7): 40 mg via INTRAVENOUS
  Filled 2020-11-08 (×7): qty 4

## 2020-11-08 MED ORDER — METHYLPREDNISOLONE SODIUM SUCC 125 MG IJ SOLR
125.0000 mg | Freq: Every day | INTRAMUSCULAR | Status: DC
  Administered 2020-11-09 – 2020-11-10 (×2): 125 mg via INTRAVENOUS
  Filled 2020-11-08 (×2): qty 2

## 2020-11-08 MED ORDER — ONDANSETRON 4 MG PO TBDP
4.0000 mg | ORAL_TABLET | Freq: Four times a day (QID) | ORAL | Status: DC | PRN
  Administered 2020-11-08: 4 mg via ORAL
  Filled 2020-11-08: qty 1

## 2020-11-08 MED ORDER — METHYLPREDNISOLONE SODIUM SUCC 125 MG IJ SOLR: 60.0000 mg | Freq: Two times a day (BID) | INTRAMUSCULAR | Status: DC

## 2020-11-08 MED ORDER — METHYLPREDNISOLONE SODIUM SUCC 125 MG IJ SOLR
62.5000 mg | Freq: Once | INTRAMUSCULAR | Status: AC
  Administered 2020-11-08: 62.5 mg via INTRAVENOUS
  Filled 2020-11-08: qty 2

## 2020-11-08 NOTE — Evaluation (Signed)
Physical Therapy Evaluation Patient Details Name: Rebecca Elliott MRN: [id number] DOB: [birth year] Today's Date: 11/08/2020  History of Present Illness  87 yo female admitted with Pna, CHF, COPD exac. Hx of DM, COPD, CAD, CKD, OA, anemia  Clinical Impression  On eval, pt required Min A for mobility. She walked ~40 feet with a RW. Ambulation distance was limited by fatigue, dyspnea. Dyspnea 3/4, O2 >90% on 3L with ambulation. Pt presents with general weakness, decreased activity tolerance, and impaired gait and balance. Discussed d/c plan-pt plans to return home where she lives with her daughter. She does not feel she need HHPT f/u at this time. Will follow and progress activity as tolerated.      Recommendations for follow up therapy are one component of a multi-disciplinary discharge planning process, led by the attending physician.  Recommendations may be updated based on patient status, additional functional criteria and insurance authorization.  Follow Up Recommendations Home health PT;Supervision - Intermittent (depending on progress and if pt is agreeable)    Equipment Recommendations  None recommended by PT    Recommendations for Other Services       Precautions / Restrictions Precautions Precautions: Fall Precaution Comments: monitor O2 Restrictions Weight Bearing Restrictions: No      Mobility  Bed Mobility Overal bed mobility: Needs Assistance Bed Mobility: Supine to Sit     Supine to sit: Supervision     General bed mobility comments: Supv for safety, lines. Increased time.    Transfers Overall transfer level: Needs assistance Equipment used: Rolling walker (2 wheeled) Transfers: Sit to/from Stand Sit to Stand: Min assist         General transfer comment: Assist to power up, steady, control descent. Cues for safety, technique, hand placement.  Ambulation/Gait Ambulation/Gait assistance: Min assist Gait Distance (Feet): 40 Feet Assistive device: Rolling  walker (2 wheeled) Gait Pattern/deviations: Step-through pattern;Decreased stride length     General Gait Details: Dyspnea 3/4. O2 >90% on 3L. Pt fatigues easily. Assist to steady througout distance. Cue for safety, RW proximity  Stairs            Wheelchair Mobility    Modified Rankin (Stroke Patients Only)       Balance Overall balance assessment: Needs assistance         Standing balance support: Bilateral upper extremity supported Standing balance-Leahy Scale: Poor                               Pertinent Vitals/Pain Pain Assessment: No/denies pain    Home Living Family/patient expects to be discharged to:: Private residence Living Arrangements: Children Available Help at Discharge: Family Type of Home: House       Home Layout: Multi-level;Able to live on main level with bedroom/bathroom Home Equipment: Walker - 4 wheels      Prior Function Level of Independence: Independent with assistive device(s)         Comments: uses rollator     Hand Dominance        Extremity/Trunk Assessment   Upper Extremity Assessment Upper Extremity Assessment: Generalized weakness    Lower Extremity Assessment Lower Extremity Assessment: Generalized weakness    Cervical / Trunk Assessment Cervical / Trunk Assessment: Normal  Communication   Communication: HOH  Cognition Arousal/Alertness: Awake/alert Behavior During Therapy: WFL for tasks assessed/performed Overall Cognitive Status: Within Functional Limits for tasks assessed  General Comments      Exercises     Assessment/Plan    PT Assessment Patient needs continued PT services  PT Problem List Decreased strength;Decreased mobility;Decreased activity tolerance;Decreased balance;Decreased knowledge of use of DME       PT Treatment Interventions DME instruction;Gait training;Therapeutic activities;Therapeutic  exercise;Patient/family education;Functional mobility training;Balance training    PT Goals (Current goals can be found in the Care Plan section)  Acute Rehab PT Goals Patient Stated Goal: home soon PT Goal Formulation: With patient Time For Goal Achievement: 11/22/20 Potential to Achieve Goals: Good    Frequency Min 3X/week   Barriers to discharge        Co-evaluation               AM-PAC PT "6 Clicks" Mobility  Outcome Measure Help needed turning from your back to your side while in a flat bed without using bedrails?: A Little Help needed moving from lying on your back to sitting on the side of a flat bed without using bedrails?: A Little Help needed moving to and from a bed to a chair (including a wheelchair)?: A Little Help needed standing up from a chair using your arms (e.g., wheelchair or bedside chair)?: A Little Help needed to walk in hospital room?: A Little Help needed climbing 3-5 steps with a railing? : A Lot 6 Click Score: 17    End of Session Equipment Utilized During Treatment: Gait belt Activity Tolerance: Patient limited by fatigue Patient left: in bed;with call bell/phone within reach;with family/visitor present (pt sitting EOB with family visiting, family stated they felt comfortable getting her back into bed once ready)   PT Visit Diagnosis: Difficulty in walking, not elsewhere classified (R26.2);Muscle weakness (generalized) (M62.81)    Time: 8110-3159 PT Time Calculation (min) (ACUTE ONLY): 20 min   Charges:   PT Evaluation $PT Eval Moderate Complexity: [place], PT Acute Rehabilitation  Office: [phone] Pager: [phone]

## 2020-11-08 NOTE — Progress Notes (Signed)
PROGRESS NOTE  ANGLES TREVIZO PYK:[id number] DOB: March 15, 1926 DOA: 11/05/2020 PCP: Colon Branch, MD  HPI/Recap of past 74 hours: 87 year old female with past medical history significant for diabetes mellitus type 2, hypertension, COPD, CAD and stage IIIb chronic kidney disease admitted to the hospitalist service on 10/19 for community-acquired pneumonia, exacerbation of both COPD as well as diastolic heart failure.  Also found to be hypoxic, initially requiring 2 L which is since been increased up to 3.5 L.  Patient not on home oxygen.  Patient started on IV Lasix as well as antibiotics, steroids and nebulizers.  Patient feels like her breathing is improved from when she first came in although initially she required 2 L and currently at 3.5 L with average oxygen saturation around 94%.  She denies any complaints.  Denies any pain.  Assessment/Plan: Principal Problem: Acute respiratory failure with hypoxia secondary to community-acquired pneumonia, COPD exacerbation and acute on chronic diastolic heart failure: Continue diuretics.  Blood cultures remain negative.  Oxygen needs have actually gone up in the last 24 hours.  No strong urine output reported.  I suspect that she may end up needing oxygen upon discharge, hopefully can get down to 2 L.  Continue steroids.  Recheck BNP and procalcitonin Active Problems:   Type 2 diabetes mellitus with diabetic neuropathy, unspecified ([place]): CBGs remained stable.    Essential hypertension: Blood pressure still on the higher side.  Should improve as we further diurese.    Hypokalemia/hypomagnesemia: Electrolytes remaining stable.      Diarrhea: Looks to be improved.  C. difficile toxin negative.   CKD (chronic kidney disease) stage 3, GFR 30-59 ml/min ([place]): Renal function stable.  Continue to monitor, especially in the setting of Lasix    History of anemia due to CKD: Hemoglobin stable.    Obesity (BMI 30-39.9): Patient meets criteria BMI greater  than 30   Code Status: Full code  Family Communication: Daughter is at the bedside  Disposition Plan: Discharge once fully diuresed, down to 2 L   Consultants: None  Procedures: None  Antimicrobials: IV Rocephin and doxycycline 10/20-present  DVT prophylaxis: SCDs  Level of care: Telemetry   Objective: Vitals:   11/08/20 0546 11/08/20 0826  BP: (!) 162/55   Pulse: 63   Resp:    Temp:    SpO2:  96%    Intake/Output Summary (Last 24 hours) at 11/08/2020 0850 Last data filed at 11/07/2020 2000 Gross per 24 hour  Intake 460 ml  Output --  Net 460 ml   Filed Weights   11/05/20 1047 11/07/20 0500 11/08/20 0640  Weight: 88.8 kg 95.7 kg 92.5 kg   Body mass index is 32.91 kg/m.  Exam:  General: Alert and oriented x2, no acute distress HEENT: Normocephalic, atraumatic, mucous membranes are moist Cardiovascular: Regular rate and rhythm, S1-S2, 2 out of 6 systolic ejection murmur Respiratory: Decreased breath sounds throughout Abdomen: Soft, nontender, nondistended, positive bowel sounds Musculoskeletal: No clubbing or cyanosis, 1+ pitting edema from the knees down bilaterally Skin: No skin breaks, tears or lesions Psychiatry: Appropriate, no evidence of psychoses Neurology: No focal deficits   Data Reviewed: CBC: Recent Labs  Lab 11/05/20 1120 11/06/20 0413  WBC 9.7 7.4  NEUTROABS 6.4 6.6  HGB 11.5* 10.6*  HCT 37.0 35.9*  MCV 83.3 87.1  PLT 281 539   Basic Metabolic Panel: Recent Labs  Lab 11/05/20 1120 11/06/20 0413  NA 136 138  K 3.2* 4.0  CL 101 106  CO2 24  21*  GLUCOSE 112* 161*  BUN 19 17  CREATININE 1.40* 1.06*  CALCIUM 8.8* 8.4*  MG 1.6* 2.2  PHOS  --  4.1   GFR: Estimated Creatinine Clearance: 38.8 mL/min (A) (by C-G formula based on SCr of 1.06 mg/dL (H)). Liver Function Tests: Recent Labs  Lab 11/05/20 1120 11/06/20 0413  AST 25 20  ALT 13 11  ALKPHOS 48 43  BILITOT 0.8 0.8  PROT 7.9 7.4  ALBUMIN 3.3* 3.0*   Recent  Labs  Lab 11/05/20 1120  LIPASE 40   No results for input(s): AMMONIA in the last 168 hours. Coagulation Profile: No results for input(s): INR, PROTIME in the last 168 hours. Cardiac Enzymes: No results for input(s): CKTOTAL, CKMB, CKMBINDEX, TROPONINI in the last 168 hours. BNP (last 3 results) No results for input(s): PROBNP in the last 8760 hours. HbA1C: No results for input(s): HGBA1C in the last 72 hours. CBG: Recent Labs  Lab 11/07/20 0750 11/07/20 1135 11/07/20 1642 11/07/20 2036 11/08/20 0719  GLUCAP 174* 226* 175* 184* 195*   Lipid Profile: No results for input(s): CHOL, HDL, LDLCALC, TRIG, CHOLHDL, LDLDIRECT in the last 72 hours. Thyroid Function Tests: No results for input(s): TSH, T4TOTAL, FREET4, T3FREE, THYROIDAB in the last 72 hours. Anemia Panel: No results for input(s): VITAMINB12, FOLATE, FERRITIN, TIBC, IRON, RETICCTPCT in the last 72 hours. Urine analysis:    Component Value Date/Time   COLORURINE YELLOW 11/06/2020 0501   APPEARANCEUR CLOUDY (A) 11/06/2020 0501   LABSPEC 1.014 11/06/2020 0501   PHURINE 5.0 11/06/2020 0501   GLUCOSEU NEGATIVE 11/06/2020 0501   GLUCOSEU NEGATIVE 04/13/2019 1049   HGBUR NEGATIVE 11/06/2020 0501   BILIRUBINUR NEGATIVE 11/06/2020 0501   KETONESUR 20 (A) 11/06/2020 0501   PROTEINUR 100 (A) 11/06/2020 0501   UROBILINOGEN 0.2 04/13/2019 1049   NITRITE NEGATIVE 11/06/2020 0501   LEUKOCYTESUR LARGE (A) 11/06/2020 0501   Sepsis Labs: @LABRCNTIP (procalcitonin:4,lacticidven:4)  ) Recent Results (from the past 240 hour(s))  Resp Panel by RT-PCR (Flu A&B, Covid) Nasopharyngeal Swab     Status: None   Collection Time: 11/05/20 11:20 AM   Specimen: Nasopharyngeal Swab; Nasopharyngeal(NP) swabs in vial transport medium  Result Value Ref Range Status   SARS Coronavirus 2 by RT PCR NEGATIVE NEGATIVE Final    Comment: (NOTE) SARS-CoV-2 target nucleic acids are NOT DETECTED.  The SARS-CoV-2 RNA is generally detectable in  upper respiratory specimens during the acute phase of infection. The lowest concentration of SARS-CoV-2 viral copies this assay can detect is 138 copies/mL. A negative result does not preclude SARS-Cov-2 infection and should not be used as the sole basis for treatment or other patient management decisions. A negative result may occur with  improper specimen collection/handling, submission of specimen other than nasopharyngeal swab, presence of viral mutation(s) within the areas targeted by this assay, and inadequate number of viral copies(<138 copies/mL). A negative result must be combined with clinical observations, patient history, and epidemiological information. The expected result is Negative.  Fact Sheet for Patients:  EntrepreneurPulse.com.au  Fact Sheet for Healthcare Providers:  IncredibleEmployment.be  This test is no t yet approved or cleared by the [place] FDA and  has been authorized for detection and/or diagnosis of SARS-CoV-2 by FDA under an Emergency Use Authorization (EUA). This EUA will remain  in effect (meaning this test can be used) for the duration of the COVID-19 declaration under Section 564(b)(1) of the Act, 21 U.S.C.section 360bbb-3(b)(1), unless the authorization is terminated  or revoked sooner.  Influenza A by PCR NEGATIVE NEGATIVE Final   Influenza B by PCR NEGATIVE NEGATIVE Final    Comment: (NOTE) The Xpert Xpress SARS-CoV-2/FLU/RSV plus assay is intended as an aid in the diagnosis of influenza from Nasopharyngeal swab specimens and should not be used as a sole basis for treatment. Nasal washings and aspirates are unacceptable for Xpert Xpress SARS-CoV-2/FLU/RSV testing.  Fact Sheet for Patients: EntrepreneurPulse.com.au  Fact Sheet for Healthcare Providers: IncredibleEmployment.be  This test is not yet approved or cleared by the [place] FDA and has been  authorized for detection and/or diagnosis of SARS-CoV-2 by FDA under an Emergency Use Authorization (EUA). This EUA will remain in effect (meaning this test can be used) for the duration of the COVID-19 declaration under Section 564(b)(1) of the Act, 21 U.S.C. section 360bbb-3(b)(1), unless the authorization is terminated or revoked.  Performed at [facility], [address] [place], [place] [address]   Blood culture (routine x 2)     Status: None (Preliminary result)   Collection Time: 11/05/20  2:09 PM   Specimen: BLOOD RIGHT ARM  Result Value Ref Range Status   Specimen Description   Final    BLOOD RIGHT ARM Performed at [facility], [address] [place], [place] [address]    Special Requests   Final    BOTTLES DRAWN AEROBIC AND ANAEROBIC Blood Culture adequate volume Performed at [facility], [address] [place], [place] [address]    Culture   Final    NO GROWTH 2 DAYS Performed at [facility], [place] [address] [place], [place] [address]    Report Status PENDING  Incomplete  Blood culture (routine x 2)     Status: None (Preliminary result)   Collection Time: 11/05/20  2:09 PM   Specimen: Left Antecubital; Blood  Result Value Ref Range Status   Specimen Description   Final    LEFT ANTECUBITAL Performed at [facility], [address] [place], [place] [address]    Special Requests   Final    BOTTLES DRAWN AEROBIC AND ANAEROBIC Blood Culture adequate volume Performed at [facility], [address] [place], [place] [address]    Culture   Final    NO GROWTH 2 DAYS Performed at [facility], [place] [address], [place], [place] [address]    Report Status PENDING  Incomplete  Gastrointestinal Panel by PCR , Stool     Status: None   Collection Time: 11/06/20  2:46 PM   Specimen: STOOL  Result Value Ref Range Status   Campylobacter species NOT DETECTED NOT DETECTED Final   Plesimonas shigelloides  NOT DETECTED NOT DETECTED Final   Salmonella species NOT DETECTED NOT DETECTED Final   Yersinia enterocolitica NOT DETECTED NOT DETECTED Final   Vibrio species NOT DETECTED NOT DETECTED Final   Vibrio cholerae NOT DETECTED NOT DETECTED Final   Enteroaggregative E coli (EAEC) NOT DETECTED NOT DETECTED Final   Enteropathogenic E coli (EPEC) NOT DETECTED NOT DETECTED Final   Enterotoxigenic E coli (ETEC) NOT DETECTED NOT DETECTED Final   Shiga like toxin producing E coli (STEC) NOT DETECTED NOT DETECTED Final   Shigella/Enteroinvasive E coli (EIEC) NOT DETECTED NOT DETECTED Final   Cryptosporidium NOT DETECTED NOT DETECTED Final   Cyclospora cayetanensis NOT DETECTED NOT DETECTED Final   Entamoeba histolytica NOT DETECTED NOT DETECTED Final   Giardia lamblia NOT DETECTED NOT DETECTED Final  Adenovirus F40/41 NOT DETECTED NOT DETECTED Final   Astrovirus NOT DETECTED NOT DETECTED Final   Norovirus GI/GII NOT DETECTED NOT DETECTED Final   Rotavirus A NOT DETECTED NOT DETECTED Final   Sapovirus (I, II, IV, and V) NOT DETECTED NOT DETECTED Final    Comment: Performed at [facility], [address], [place], [place] [address]  C Difficile Quick Screen w PCR reflex     Status: None   Collection Time: 11/06/20  2:46 PM   Specimen: STOOL  Result Value Ref Range Status   C Diff antigen NEGATIVE NEGATIVE Final   C Diff toxin NEGATIVE NEGATIVE Final   C Diff interpretation No C. difficile detected.  Final    Comment: Performed at [facility], [place] [address] [place], [place] [address]      Studies: No results found.  Scheduled Meds:  amLODipine  10 mg Oral Daily   aspirin EC  81 mg Oral Daily   doxycycline  100 mg Oral Q12H   hydrALAZINE  25 mg Oral TID   hydrochlorothiazide  12.5 mg Oral Daily   insulin aspart  0-9 Units Subcutaneous TID WC   ipratropium-albuterol  3 mL Nebulization TID   irbesartan  150 mg Oral Daily   loratadine  5 mg Oral Daily    methylPREDNISolone (SOLU-MEDROL) injection  80 mg Intravenous Q12H   metoprolol succinate  100 mg Oral Daily    Continuous Infusions:  cefTRIAXone (ROCEPHIN)  IV Stopped (11/07/20 1624)     LOS: 3 days     Annita Brod, MD [facility]   11/08/2020, 8:50 AM

## 2020-11-09 DIAGNOSIS — I10 Essential (primary) hypertension: Secondary | ICD-10-CM | POA: Diagnosis not present

## 2020-11-09 DIAGNOSIS — J189 Pneumonia, unspecified organism: Secondary | ICD-10-CM | POA: Diagnosis not present

## 2020-11-09 DIAGNOSIS — N1832 Chronic kidney disease, stage 3b: Secondary | ICD-10-CM | POA: Diagnosis not present

## 2020-11-09 DIAGNOSIS — J9621 Acute and chronic respiratory failure with hypoxia: Secondary | ICD-10-CM | POA: Diagnosis not present

## 2020-11-09 LAB — BASIC METABOLIC PANEL
Anion gap: 6 (ref 5–15)
BUN: 40 mg/dL — ABNORMAL HIGH (ref 8–23)
CO2: 29 mmol/L (ref 22–32)
Calcium: 8.2 mg/dL — ABNORMAL LOW (ref 8.9–10.3)
Chloride: 101 mmol/L (ref 98–111)
Creatinine, Ser: 1.41 mg/dL — ABNORMAL HIGH (ref 0.44–1.00)
GFR, Estimated: 35 mL/min — ABNORMAL LOW (ref >60)
Glucose, Bld: 188 mg/dL — ABNORMAL HIGH (ref 70–99)
Potassium: 3.7 mmol/L (ref 3.5–5.1)
Sodium: 136 mmol/L (ref 135–145)

## 2020-11-09 LAB — GLUCOSE, CAPILLARY
Glucose-Capillary: 155 mg/dL — ABNORMAL HIGH (ref 70–99)
Glucose-Capillary: 218 mg/dL — ABNORMAL HIGH (ref 70–99)
Glucose-Capillary: 217 mg/dL — ABNORMAL HIGH (ref 70–99)
Glucose-Capillary: 247 mg/dL — ABNORMAL HIGH (ref 70–99)

## 2020-11-09 MED ORDER — SODIUM CHLORIDE 0.9 % IV SOLN
2.0000 g | Freq: Two times a day (BID) | INTRAVENOUS | Status: DC
  Administered 2020-11-09 (×2): 2 g via INTRAVENOUS
  Filled 2020-11-09 (×3): qty 2

## 2020-11-09 MED ORDER — BENZONATATE 100 MG PO CAPS
100.0000 mg | ORAL_CAPSULE | Freq: Three times a day (TID) | ORAL | Status: DC | PRN
  Administered 2020-11-09 (×2): 100 mg via ORAL
  Filled 2020-11-09 (×2): qty 1

## 2020-11-09 MED ORDER — IPRATROPIUM-ALBUTEROL 0.5-2.5 (3) MG/3ML IN SOLN
3.0000 mL | Freq: Two times a day (BID) | RESPIRATORY_TRACT | Status: DC
  Administered 2020-11-09 – 2020-11-12 (×6): 3 mL via RESPIRATORY_TRACT
  Filled 2020-11-09 (×6): qty 3

## 2020-11-09 NOTE — Evaluation (Signed)
Occupational Therapy Evaluation Patient Details Name: Rebecca Elliott MRN: [id number] DOB: [birth year] Today's Date: 11/09/2020   History of Present Illness 87 yo female admitted with Pna, CHF, COPD exac. Hx of DM, COPD, CAD, CKD, OA, anemia   Clinical Impression   Pt is typically mod I with DME, lives with her daughter who performs IADL. Today Pt presents with decreased activity tolerance, requiring 3L O2 during activity to maintain SpO2 >90%. Overall min A for ADL, able to complete toilet transfer at min A with RW - increased time, vc for safe hand placement. Able to complete grooming at sink in standing but with DOE 3/4. At this time recommending [place] post-acute to maximize safety and independence in ADL and functional transfers through meaningful occupation, and also perform fall/safety evaluation and work with daughters on caregiver education. OT will follow acutely until discharge.      Recommendations for follow up therapy are one component of a multi-disciplinary discharge planning process, led by the attending physician.  Recommendations may be updated based on patient status, additional functional criteria and insurance authorization.   Follow Up Recommendations  Home health OT;Supervision/Assistance - 24 hour    Equipment Recommendations  3 in 1 bedside commode    Recommendations for Other Services PT consult     Precautions / Restrictions Precautions Precautions: Fall Precaution Comments: monitor O2 Restrictions Weight Bearing Restrictions: No      Mobility Bed Mobility Overal bed mobility: Needs Assistance Bed Mobility: Supine to Sit     Supine to sit: Min guard     General bed mobility comments: Supv for safety, lines. Increased time.    Transfers Overall transfer level: Needs assistance Equipment used: Rolling walker (2 wheeled) Transfers: Sit to/from Stand Sit to Stand: Min assist         General transfer comment: Assist to power up, steady, control  descent. Cues for safety, technique, hand placement.    Balance Overall balance assessment: Needs assistance Sitting-balance support: No upper extremity supported;Feet supported Sitting balance-Leahy Scale: Good     Standing balance support: Bilateral upper extremity supported;No upper extremity supported;During functional activity Standing balance-Leahy Scale: Poor Standing balance comment: leans against sink during standing grooming                           ADL either performed or assessed with clinical judgement   ADL Overall ADL's : Needs assistance/impaired Eating/Feeding: Independent   Grooming: Wash/dry hands;Min guard;Standing Grooming Details (indicate cue type and reason): sink level Upper Body Bathing: Minimal assistance Upper Body Bathing Details (indicate cue type and reason): for back Lower Body Bathing: Minimal assistance   Upper Body Dressing : Min guard;Sitting Upper Body Dressing Details (indicate cue type and reason): for gown as robe Lower Body Dressing: Moderate assistance Lower Body Dressing Details (indicate cue type and reason): to don socks Toilet Transfer: Minimal assistance;Ambulation;RW;Regular Toilet;Grab bars Toilet Transfer Details (indicate cue type and reason): into bathroom Toileting- Clothing Manipulation and Hygiene: Minimal assistance;Sit to/from stand Toileting - Clothing Manipulation Details (indicate cue type and reason): for gown management     Functional mobility during ADLs: Minimal assistance;Rolling walker General ADL Comments: decreased activity tolerance, DOE 3/4 with ambulation and standing activity     Vision Baseline Vision/History: 1 Wears glasses Ability to See in Adequate Light: 0 Adequate Patient Visual Report: No change from baseline Vision Assessment?: No apparent visual deficits     Perception     Praxis  Pertinent Vitals/Pain Pain Assessment: No/denies pain     Hand Dominance Right    Extremity/Trunk Assessment Upper Extremity Assessment Upper Extremity Assessment: Generalized weakness   Lower Extremity Assessment Lower Extremity Assessment: Defer to PT evaluation   Cervical / Trunk Assessment Cervical / Trunk Assessment: Normal   Communication Communication Communication: HOH   Cognition Arousal/Alertness: Awake/alert Behavior During Therapy: WFL for tasks assessed/performed Overall Cognitive Status: Within Functional Limits for tasks assessed                                     General Comments  SpO2 >90% on 3L for activity    Exercises     Shoulder Instructions      Home Living Family/patient expects to be discharged to:: Private residence Living Arrangements: Children Available Help at Discharge: Family Type of Home: House       Home Layout: Multi-level;Able to live on main level with bedroom/bathroom               Home Equipment: Walker - 4 wheels          Prior Functioning/Environment Level of Independence: Independent with assistive device(s)        Comments: uses rollator        OT Problem List: Decreased strength;Decreased activity tolerance;Impaired balance (sitting and/or standing);Decreased knowledge of use of DME or AE      OT Treatment/Interventions: Self-care/ADL training;Energy conservation;DME and/or AE instruction;Therapeutic activities;Patient/family education;Balance training    OT Goals(Current goals can be found in the care plan section) Acute Rehab OT Goals Patient Stated Goal: home soon OT Goal Formulation: With patient Time For Goal Achievement: 11/23/20 Potential to Achieve Goals: Good  OT Frequency: Min 2X/week   Barriers to D/C:            Co-evaluation              AM-PAC OT "6 Clicks" Daily Activity     Outcome Measure Help from another person eating meals?: None Help from another person taking care of personal grooming?: A Little Help from another person toileting,  which includes using toliet, bedpan, or urinal?: A Little Help from another person bathing (including washing, rinsing, drying)?: A Little Help from another person to put on and taking off regular upper body clothing?: A Little Help from another person to put on and taking off regular lower body clothing?: A Little 6 Click Score: 19   End of Session Equipment Utilized During Treatment: Gait belt;Rolling walker;Oxygen (3L) Nurse Communication: Mobility status  Activity Tolerance: Patient tolerated treatment well Patient left: in bed;with bed alarm set;with call bell/phone within reach  OT Visit Diagnosis: Unsteadiness on feet (R26.81);Other abnormalities of gait and mobility (R26.89);Muscle weakness (generalized) (M62.81)                Time: 4742-5956 OT Time Calculation (min): 21 min Charges:  OT General Charges $OT Visit: 1 Visit OT Evaluation $OT Eval Moderate Complexity: [place] OTR/L Acute Rehabilitation Services Pager: [phone] Office: [place] 11/09/2020, 5:16 PM

## 2020-11-09 NOTE — Progress Notes (Signed)
PHARMACY NOTE -  Cefepime  Pharmacy has been assisting with dosing of cefepim for PNA. Dosage remains stable at 2g IV q12 hr (borderline renal function for q24 hr dosing, but SCr improving) and further renal adjustments per institutional Pharmacy antibiotic protocol  Pharmacy will sign off, following peripherally for culture results or dose adjustments. Please reconsult if a change in clinical status warrants re-evaluation of dosage.  Reuel Boom, PharmD, BCPS [phone] 11/09/2020, 10:18 AM

## 2020-11-09 NOTE — Progress Notes (Signed)
PROGRESS NOTE  Rebecca Elliott DQQ:[id number] DOB: [birth year] DOA: 11/05/2020 PCP: Colon Branch, MD  HPI/Recap of past 13 hours: 87 year old female with past medical history significant for diabetes mellitus type 2, hypertension, COPD, CAD and stage IIIb chronic kidney disease admitted to the hospitalist service on 10/19 for community-acquired pneumonia, exacerbation of both COPD as well as diastolic heart failure.  Also found to be hypoxic, initially requiring 2 L which is since been increased up to 3.5 L.  Patient not on home oxygen.  Patient started on IV Lasix as well as antibiotics, steroids and nebulizers.  Patient feels like her breathing is improved from when she first came in although initially she required 2 L, increased to as high as 3.5 L and currently at 3 L.  This morning, BNP elevated to 1700 up from 900 a few days prior.  She does note a little bit more of a cough today.  Assessment/Plan: Principal Problem: Acute respiratory failure with hypoxia secondary to community-acquired pneumonia, COPD exacerbation and acute on chronic diastolic heart failure: Have restarted diuretics.  Blood cultures remain negative.  Oxygen needs have actually gone up in the last 24 hours.  No strong urine output reported.  I suspect that she may end up needing oxygen upon discharge, hopefully can get down to 2 L.  Slowly tapering down steroids.  Given procalcitonin level still staying somewhat elevated, have changed to cefepime. Active Problems:   Type 2 diabetes mellitus with diabetic neuropathy, unspecified ([place]): CBGs remained stable.    Essential hypertension: Blood pressure still on the higher side.  Should improve as we further diurese.    Hypokalemia/hypomagnesemia: Electrolytes remaining stable.      Diarrhea: Looks to be improved.  C. difficile toxin negative.    CKD (chronic kidney disease) stage 3, GFR 30-59 ml/min ([place]): Renal function stable.  Continue to monitor, especially in the  setting of Lasix    History of anemia due to CKD: Hemoglobin stable.    Obesity (BMI 30-39.9): Patient meets criteria BMI greater than 30   Code Status: Full code  Family Communication: Daughters at the bedside  Disposition Plan: Discharge once fully diuresed, down to 2 L nasal cannula   Consultants: None  Procedures: Echocardiogram done 19/41: Grade 2 diastolic dysfunction.  Preserved ejection fraction.  Antimicrobials: IV Rocephin and doxycycline 10/20-10/23 IV cefepime 10/23-present  DVT prophylaxis: SCDs  Level of care: Telemetry   Objective: Vitals:   11/09/20 0605 11/09/20 0627  BP: (!) 172/59   Pulse: 64   Resp: 18   Temp: 98.6 F (37 C)   SpO2: 97% 92%    Intake/Output Summary (Last 24 hours) at 11/09/2020 0958 Last data filed at 11/09/2020 0940 Gross per 24 hour  Intake 480 ml  Output 3800 ml  Net -3320 ml    Filed Weights   11/07/20 0500 11/08/20 0640 11/09/20 0500  Weight: 95.7 kg 92.5 kg 92.6 kg   Body mass index is 32.95 kg/m.  Exam:  General: Alert and oriented x2, no acute distress HEENT: Normocephalic, atraumatic, mucous membranes are moist Cardiovascular: Regular rate and rhythm, S1-S2, 2 out of 6 systolic ejection murmur Respiratory: Decreased breath sounds throughout Abdomen: Soft, nontender, nondistended, positive bowel sounds Musculoskeletal: No clubbing or cyanosis, 1+ pitting edema from the knees down bilaterally Skin: No skin breaks, tears or lesions Psychiatry: Appropriate, no evidence of psychoses Neurology: No focal deficits   Data Reviewed: CBC: Recent Labs  Lab 11/05/20 1120 11/06/20 0413 11/08/20 0946  WBC  9.7 7.4 9.6  NEUTROABS 6.4 6.6  --   HGB 11.5* 10.6* 11.5*  HCT 37.0 35.9* 39.4  MCV 83.3 87.1 87.4  PLT 281 250 258    Basic Metabolic Panel: Recent Labs  Lab 11/05/20 1120 11/06/20 0413 11/08/20 0946 11/09/20 0342  NA 136 138 135 136  K 3.2* 4.0 3.6 3.7  CL 101 106 103 101  CO2 24 21* 26 29   GLUCOSE 112* 161* 199* 188*  BUN 19 17 36* 40*  CREATININE 1.40* 1.06* 1.53* 1.41*  CALCIUM 8.8* 8.4* 8.5* 8.2*  MG 1.6* 2.2  --   --   PHOS  --  4.1  --   --     GFR: Estimated Creatinine Clearance: 29.2 mL/min (A) (by C-G formula based on SCr of 1.41 mg/dL (H)). Liver Function Tests: Recent Labs  Lab 11/05/20 1120 11/06/20 0413  AST 25 20  ALT 13 11  ALKPHOS 48 43  BILITOT 0.8 0.8  PROT 7.9 7.4  ALBUMIN 3.3* 3.0*    Recent Labs  Lab 11/05/20 1120  LIPASE 40    No results for input(s): AMMONIA in the last 168 hours. Coagulation Profile: No results for input(s): INR, PROTIME in the last 168 hours. Cardiac Enzymes: No results for input(s): CKTOTAL, CKMB, CKMBINDEX, TROPONINI in the last 168 hours. BNP (last 3 results) No results for input(s): PROBNP in the last 8760 hours. HbA1C: No results for input(s): HGBA1C in the last 72 hours. CBG: Recent Labs  Lab 11/08/20 0719 11/08/20 1106 11/08/20 1540 11/08/20 1952 11/09/20 0800  GLUCAP 195* 94 265* 227* 155*    Lipid Profile: No results for input(s): CHOL, HDL, LDLCALC, TRIG, CHOLHDL, LDLDIRECT in the last 72 hours. Thyroid Function Tests: No results for input(s): TSH, T4TOTAL, FREET4, T3FREE, THYROIDAB in the last 72 hours. Anemia Panel: No results for input(s): VITAMINB12, FOLATE, FERRITIN, TIBC, IRON, RETICCTPCT in the last 72 hours. Urine analysis:    Component Value Date/Time   COLORURINE YELLOW 11/06/2020 0501   APPEARANCEUR CLOUDY (A) 11/06/2020 0501   LABSPEC 1.014 11/06/2020 0501   PHURINE 5.0 11/06/2020 0501   GLUCOSEU NEGATIVE 11/06/2020 0501   GLUCOSEU NEGATIVE 04/13/2019 1049   HGBUR NEGATIVE 11/06/2020 0501   BILIRUBINUR NEGATIVE 11/06/2020 0501   KETONESUR 20 (A) 11/06/2020 0501   PROTEINUR 100 (A) 11/06/2020 0501   UROBILINOGEN 0.2 04/13/2019 1049   NITRITE NEGATIVE 11/06/2020 0501   LEUKOCYTESUR LARGE (A) 11/06/2020 0501   Sepsis  Labs: @LABRCNTIP (procalcitonin:4,lacticidven:4)  ) Recent Results (from the past 240 hour(s))  Resp Panel by RT-PCR (Flu A&B, Covid) Nasopharyngeal Swab     Status: None   Collection Time: 11/05/20 11:20 AM   Specimen: Nasopharyngeal Swab; Nasopharyngeal(NP) swabs in vial transport medium  Result Value Ref Range Status   SARS Coronavirus 2 by RT PCR NEGATIVE NEGATIVE Final    Comment: (NOTE) SARS-CoV-2 target nucleic acids are NOT DETECTED.  The SARS-CoV-2 RNA is generally detectable in upper respiratory specimens during the acute phase of infection. The lowest concentration of SARS-CoV-2 viral copies this assay can detect is 138 copies/mL. A negative result does not preclude SARS-Cov-2 infection and should not be used as the sole basis for treatment or other patient management decisions. A negative result may occur with  improper specimen collection/handling, submission of specimen other than nasopharyngeal swab, presence of viral mutation(s) within the areas targeted by this assay, and inadequate number of viral copies(<138 copies/mL). A negative result must be combined with clinical observations, patient history, and epidemiological information.  The expected result is Negative.  Fact Sheet for Patients:  EntrepreneurPulse.com.au  Fact Sheet for Healthcare Providers:  IncredibleEmployment.be  This test is no t yet approved or cleared by the [place] FDA and  has been authorized for detection and/or diagnosis of SARS-CoV-2 by FDA under an Emergency Use Authorization (EUA). This EUA will remain  in effect (meaning this test can be used) for the duration of the COVID-19 declaration under Section 564(b)(1) of the Act, 21 U.S.C.section 360bbb-3(b)(1), unless the authorization is terminated  or revoked sooner.       Influenza A by PCR NEGATIVE NEGATIVE Final   Influenza B by PCR NEGATIVE NEGATIVE Final    Comment: (NOTE) The Xpert  Xpress SARS-CoV-2/FLU/RSV plus assay is intended as an aid in the diagnosis of influenza from Nasopharyngeal swab specimens and should not be used as a sole basis for treatment. Nasal washings and aspirates are unacceptable for Xpert Xpress SARS-CoV-2/FLU/RSV testing.  Fact Sheet for Patients: EntrepreneurPulse.com.au  Fact Sheet for Healthcare Providers: IncredibleEmployment.be  This test is not yet approved or cleared by the [place] FDA and has been authorized for detection and/or diagnosis of SARS-CoV-2 by FDA under an Emergency Use Authorization (EUA). This EUA will remain in effect (meaning this test can be used) for the duration of the COVID-19 declaration under Section 564(b)(1) of the Act, 21 U.S.C. section 360bbb-3(b)(1), unless the authorization is terminated or revoked.  Performed at [facility], [address] [place], [place] [address]   Blood culture (routine x 2)     Status: None (Preliminary result)   Collection Time: 11/05/20  2:09 PM   Specimen: BLOOD RIGHT ARM  Result Value Ref Range Status   Specimen Description   Final    BLOOD RIGHT ARM Performed at [facility], [address] [place], [place] [address]    Special Requests   Final    BOTTLES DRAWN AEROBIC AND ANAEROBIC Blood Culture adequate volume Performed at [facility], [address] [place], [place] [address]    Culture   Final    NO GROWTH 3 DAYS Performed at [facility], [place] [address] [place], [place] [address]    Report Status PENDING  Incomplete  Blood culture (routine x 2)     Status: None (Preliminary result)   Collection Time: 11/05/20  2:09 PM   Specimen: Left Antecubital; Blood  Result Value Ref Range Status   Specimen Description   Final    LEFT ANTECUBITAL Performed at [facility], [address] [place], [place] [address]    Special Requests   Final    BOTTLES DRAWN AEROBIC AND  ANAEROBIC Blood Culture adequate volume Performed at [facility], [address] [place], [place] [address]    Culture   Final    NO GROWTH 3 DAYS Performed at [facility], [place] [address], [place], [place] [address]    Report Status PENDING  Incomplete  Gastrointestinal Panel by PCR , Stool     Status: None   Collection Time: 11/06/20  2:46 PM   Specimen: STOOL  Result Value Ref Range Status   Campylobacter species NOT DETECTED NOT DETECTED Final   Plesimonas shigelloides NOT DETECTED NOT DETECTED Final   Salmonella species NOT DETECTED NOT DETECTED Final   Yersinia enterocolitica NOT DETECTED NOT DETECTED Final   Vibrio species NOT DETECTED NOT DETECTED Final   Vibrio cholerae NOT  DETECTED NOT DETECTED Final   Enteroaggregative E coli (EAEC) NOT DETECTED NOT DETECTED Final   Enteropathogenic E coli (EPEC) NOT DETECTED NOT DETECTED Final   Enterotoxigenic E coli (ETEC) NOT DETECTED NOT DETECTED Final   Shiga like toxin producing E coli (STEC) NOT DETECTED NOT DETECTED Final   Shigella/Enteroinvasive E coli (EIEC) NOT DETECTED NOT DETECTED Final   Cryptosporidium NOT DETECTED NOT DETECTED Final   Cyclospora cayetanensis NOT DETECTED NOT DETECTED Final   Entamoeba histolytica NOT DETECTED NOT DETECTED Final   Giardia lamblia NOT DETECTED NOT DETECTED Final   Adenovirus F40/41 NOT DETECTED NOT DETECTED Final   Astrovirus NOT DETECTED NOT DETECTED Final   Norovirus GI/GII NOT DETECTED NOT DETECTED Final   Rotavirus A NOT DETECTED NOT DETECTED Final   Sapovirus (I, II, IV, and V) NOT DETECTED NOT DETECTED Final    Comment: Performed at [facility], [address] [place], [place] [address]  C Difficile Quick Screen w PCR reflex     Status: None   Collection Time: 11/06/20  2:46 PM   Specimen: STOOL  Result Value Ref Range Status   C Diff antigen NEGATIVE NEGATIVE Final   C Diff toxin NEGATIVE NEGATIVE Final   C Diff interpretation No C. difficile  detected.  Final    Comment: Performed at [facility], [place] [address] [place], [place] [address]      Studies: No results found.  Scheduled Meds:  amLODipine  10 mg Oral Daily   aspirin EC  81 mg Oral Daily   furosemide  40 mg Intravenous BID   hydrALAZINE  25 mg Oral TID   hydrochlorothiazide  12.5 mg Oral Daily   insulin aspart  0-9 Units Subcutaneous TID WC   ipratropium-albuterol  3 mL Nebulization TID   irbesartan  150 mg Oral Daily   loratadine  5 mg Oral Daily   methylPREDNISolone (SOLU-MEDROL) injection  125 mg Intravenous Daily   metoprolol succinate  100 mg Oral Daily    Continuous Infusions:     LOS: 4 days     Annita Brod, MD [facility]   11/09/2020, 9:58 AM

## 2020-11-10 ENCOUNTER — Other Ambulatory Visit: Payer: Self-pay | Admitting: Internal Medicine

## 2020-11-10 DIAGNOSIS — J189 Pneumonia, unspecified organism: Secondary | ICD-10-CM | POA: Diagnosis not present

## 2020-11-10 DIAGNOSIS — I5033 Acute on chronic diastolic (congestive) heart failure: Secondary | ICD-10-CM | POA: Diagnosis not present

## 2020-11-10 DIAGNOSIS — J441 Chronic obstructive pulmonary disease with (acute) exacerbation: Secondary | ICD-10-CM | POA: Diagnosis not present

## 2020-11-10 DIAGNOSIS — N1832 Chronic kidney disease, stage 3b: Secondary | ICD-10-CM | POA: Diagnosis not present

## 2020-11-10 LAB — BASIC METABOLIC PANEL
Anion gap: 6 (ref 5–15)
BUN: 50 mg/dL — ABNORMAL HIGH (ref 8–23)
CO2: 32 mmol/L (ref 22–32)
Calcium: 7.9 mg/dL — ABNORMAL LOW (ref 8.9–10.3)
Chloride: 98 mmol/L (ref 98–111)
Creatinine, Ser: 1.62 mg/dL — ABNORMAL HIGH (ref 0.44–1.00)
GFR, Estimated: 30 mL/min — ABNORMAL LOW (ref >60)
Glucose, Bld: 162 mg/dL — ABNORMAL HIGH (ref 70–99)
Potassium: 3.1 mmol/L — ABNORMAL LOW (ref 3.5–5.1)
Sodium: 136 mmol/L (ref 135–145)

## 2020-11-10 LAB — CULTURE, BLOOD (ROUTINE X 2)
Culture: NO GROWTH
Culture: NO GROWTH
Special Requests: ADEQUATE
Special Requests: ADEQUATE

## 2020-11-10 LAB — PROCALCITONIN: Procalcitonin: 0.27 ng/mL

## 2020-11-10 LAB — GLUCOSE, CAPILLARY
Glucose-Capillary: 112 mg/dL — ABNORMAL HIGH (ref 70–99)
Glucose-Capillary: 207 mg/dL — ABNORMAL HIGH (ref 70–99)
Glucose-Capillary: 281 mg/dL — ABNORMAL HIGH (ref 70–99)
Glucose-Capillary: 335 mg/dL — ABNORMAL HIGH (ref 70–99)

## 2020-11-10 MED ORDER — POTASSIUM CHLORIDE CRYS ER 20 MEQ PO TBCR
40.0000 meq | EXTENDED_RELEASE_TABLET | Freq: Two times a day (BID) | ORAL | Status: AC
  Administered 2020-11-10 (×2): 40 meq via ORAL
  Filled 2020-11-10 (×2): qty 2

## 2020-11-10 MED ORDER — SODIUM CHLORIDE 0.9 % IV SOLN
2.0000 g | INTRAVENOUS | Status: DC
  Administered 2020-11-10: 2 g via INTRAVENOUS
  Filled 2020-11-10 (×2): qty 2

## 2020-11-10 MED ORDER — METHYLPREDNISOLONE SODIUM SUCC 40 MG IJ SOLR
40.0000 mg | Freq: Every day | INTRAMUSCULAR | Status: DC
  Administered 2020-11-11 – 2020-11-12 (×2): 40 mg via INTRAVENOUS
  Filled 2020-11-10 (×3): qty 1

## 2020-11-10 MED ORDER — INSULIN ASPART 100 UNIT/ML IJ SOLN
5.0000 [IU] | Freq: Once | INTRAMUSCULAR | Status: AC
  Administered 2020-11-10: 5 [IU] via SUBCUTANEOUS

## 2020-11-10 NOTE — Progress Notes (Signed)
PHARMACY NOTE:  ANTIMICROBIAL RENAL DOSAGE ADJUSTMENT  Current antimicrobial regimen includes a mismatch between antimicrobial dosage and estimated renal function.  As per policy approved by the Pharmacy & Therapeutics and Medical Executive Committees, the antimicrobial dosage will be adjusted accordingly.  Current antimicrobial dosage:  cefepime  Indication: PNA  Renal Function:  Estimated Creatinine Clearance: 25.4 mL/min (A) (by C-G formula based on SCr of 1.62 mg/dL (H)). []      On intermittent HD, scheduled: []      On CRRT    Antimicrobial dosage has been changed to:  cefepime 2gm IV q24h   Thank you for allowing pharmacy to be a part of this patient's care.  Lynelle Doctor, [facility] 11/10/2020 7:05 AM

## 2020-11-10 NOTE — Progress Notes (Signed)
   11/10/20 1200  Mobility  Activity Ambulated in hall (then up in chair.)  Level of Assistance Contact guard assist, steadying assist  Assistive Device Front wheel walker  Distance Ambulated (ft) 60 ft  Mobility Ambulated with assistance in hallway  Mobility Response Tolerated well  Mobility performed by Mobility specialist;Family member  $Mobility charge 1 Mobility   Pt agreeable to mobilize this afternoon. Ambulated about 49ft in hall with RW and no O2 per RN. Tolerated well. Pt stated she felt SOB at times. Instructed pt to practice purse lipped breathing technique during these times. Her O2 sat dropped to 83% about halfway through session. Had pt take longer standing rest break until O2 was at 86%. Otherwise no complaints. Pt left in chair with family present. RN notified of session, and vitals during ambulation.   Mobility Specialist - Progress Note Pre-mobility: HR 65 SpO2 91% During mobility: HR 67-69, SpO2 83-87% Post-mobility: HR 65, SPO2 89%

## 2020-11-10 NOTE — Progress Notes (Addendum)
PROGRESS NOTE  Rebecca [place] JJO:[id number] DOB: [birth year] DOA: 11/05/2020 PCP: Colon Branch, MD  HPI/Recap of past 18 hours: 87 year old female with past medical history significant for diabetes mellitus type 2, hypertension, COPD, CAD and stage IIIb chronic kidney disease admitted to the hospitalist service on 10/19 for community-acquired pneumonia, exacerbation of both COPD as well as diastolic heart failure.  Also found to be hypoxic, initially requiring 2 L which is since been increased up to 3.5 L.  Patient not on home oxygen.  Patient started on IV Lasix as well as antibiotics, steroids and nebulizers.  Patient feels like her breathing is improved from when she first came in although initially she required 2 L, increased to as high as 3.5 L and currently at 3 L.  On 10/23, BNP elevated to 1700 up from 900 a few days prior.  Patient has been responding well to Lasix, having diuresed over 7.5 L in the last 2 days.  She complains of some mild knee pain today (chronic issue) and had a restless night, but is otherwise doing well.  Breathing okay.  Assessment/Plan: Principal Problem: Acute respiratory failure with hypoxia secondary to community-acquired pneumonia, COPD exacerbation and acute on chronic diastolic heart failure: Have restarted diuretics and has since diuresed 7.5 L over the last few days.  Blood cultures remain negative.  I suspect that she may end up needing oxygen upon discharge, hopefully can get down to 2 L.  Slowly tapering down steroids.  Active Problems:   Type 2 diabetes mellitus with diabetic neuropathy, unspecified ([place]): CBGs remained stable.    Essential hypertension: Blood pressure still on the higher side.  Should improve as we further diurese.    Hypokalemia/hypomagnesemia: Electrolytes remaining stable.      Diarrhea: Looks to be improved.  C. difficile toxin negative.    CKD (chronic kidney disease) stage 3b, GFR 30-59 ml/min [facility]): Renal function stable.   Continue to monitor, especially in the setting of Lasix    History of anemia due to CKD: Hemoglobin stable.    Obesity (BMI 30-39.9): Patient meets criteria BMI greater than 30   Code Status: Full code  Family Communication: Daughters at the bedside  Disposition Plan: Discharge once fully diuresed, down to 2 L nasal cannula, potentially tomorrow   Consultants: None  Procedures: Echocardiogram done 16/01: Grade 2 diastolic dysfunction.  Preserved ejection fraction.  Antimicrobials: IV Rocephin and doxycycline 10/20-10/23 IV cefepime 10/23-present  DVT prophylaxis: SCDs  Level of care: Telemetry   Objective: Vitals:   11/10/20 0527 11/10/20 0910  BP: (!) 163/68   Pulse: 61   Resp: 18   Temp: 98.4 F (36.9 C)   SpO2: 91% 95%    Intake/Output Summary (Last 24 hours) at 11/10/2020 0943 Last data filed at 11/10/2020 0700 Gross per 24 hour  Intake 320 ml  Output 3760 ml  Net -3440 ml    Filed Weights   11/07/20 0500 11/08/20 0640 11/09/20 0500  Weight: 95.7 kg 92.5 kg 92.6 kg   Body mass index is 32.95 kg/m.  Exam:  General: Alert and oriented x2, no acute distress HEENT: Normocephalic, atraumatic, mucous membranes are moist Cardiovascular: Regular rate and rhythm, S1-S2, 2 out of 6 systolic ejection murmur Respiratory: Decreased breath sounds throughout Abdomen: Soft, nontender, nondistended, positive bowel sounds Musculoskeletal: No clubbing or cyanosis, 1+ pitting edema from the knees down bilaterally Skin: No skin breaks, tears or lesions Psychiatry: Appropriate, no evidence of psychoses Neurology: No focal deficits   Data  Reviewed: CBC: Recent Labs  Lab 11/05/20 1120 11/06/20 0413 11/08/20 0946  WBC 9.7 7.4 9.6  NEUTROABS 6.4 6.6  --   HGB 11.5* 10.6* 11.5*  HCT 37.0 35.9* 39.4  MCV 83.3 87.1 87.4  PLT 281 250 810    Basic Metabolic Panel: Recent Labs  Lab 11/05/20 1120 11/06/20 0413 11/08/20 0946 11/09/20 0342 11/10/20 0348  NA  136 138 135 136 136  K 3.2* 4.0 3.6 3.7 3.1*  CL 101 106 103 101 98  CO2 24 21* 26 29 32  GLUCOSE 112* 161* 199* 188* 162*  BUN 19 17 36* 40* 50*  CREATININE 1.40* 1.06* 1.53* 1.41* 1.62*  CALCIUM 8.8* 8.4* 8.5* 8.2* 7.9*  MG 1.6* 2.2  --   --   --   PHOS  --  4.1  --   --   --     GFR: Estimated Creatinine Clearance: 25.4 mL/min (A) (by C-G formula based on SCr of 1.62 mg/dL (H)). Liver Function Tests: Recent Labs  Lab 11/05/20 1120 11/06/20 0413  AST 25 20  ALT 13 11  ALKPHOS 48 43  BILITOT 0.8 0.8  PROT 7.9 7.4  ALBUMIN 3.3* 3.0*    Recent Labs  Lab 11/05/20 1120  LIPASE 40    No results for input(s): AMMONIA in the last 168 hours. Coagulation Profile: No results for input(s): INR, PROTIME in the last 168 hours. Cardiac Enzymes: No results for input(s): CKTOTAL, CKMB, CKMBINDEX, TROPONINI in the last 168 hours. BNP (last 3 results) No results for input(s): PROBNP in the last 8760 hours. HbA1C: No results for input(s): HGBA1C in the last 72 hours. CBG: Recent Labs  Lab 11/09/20 0800 11/09/20 1150 11/09/20 1603 11/09/20 2140 11/10/20 0729  GLUCAP 155* 218* 217* 247* 112*    Lipid Profile: No results for input(s): CHOL, HDL, LDLCALC, TRIG, CHOLHDL, LDLDIRECT in the last 72 hours. Thyroid Function Tests: No results for input(s): TSH, T4TOTAL, FREET4, T3FREE, THYROIDAB in the last 72 hours. Anemia Panel: No results for input(s): VITAMINB12, FOLATE, FERRITIN, TIBC, IRON, RETICCTPCT in the last 72 hours. Urine analysis:    Component Value Date/Time   COLORURINE YELLOW 11/06/2020 0501   APPEARANCEUR CLOUDY (A) 11/06/2020 0501   LABSPEC 1.014 11/06/2020 0501   PHURINE 5.0 11/06/2020 0501   GLUCOSEU NEGATIVE 11/06/2020 0501   GLUCOSEU NEGATIVE 04/13/2019 1049   HGBUR NEGATIVE 11/06/2020 0501   BILIRUBINUR NEGATIVE 11/06/2020 0501   KETONESUR 20 (A) 11/06/2020 0501   PROTEINUR 100 (A) 11/06/2020 0501   UROBILINOGEN 0.2 04/13/2019 1049   NITRITE  NEGATIVE 11/06/2020 0501   LEUKOCYTESUR LARGE (A) 11/06/2020 0501   Sepsis Labs: @LABRCNTIP (procalcitonin:4,lacticidven:4)  ) Recent Results (from the past 240 hour(s))  Resp Panel by RT-PCR (Flu A&B, Covid) Nasopharyngeal Swab     Status: None   Collection Time: 11/05/20 11:20 AM   Specimen: Nasopharyngeal Swab; Nasopharyngeal(NP) swabs in vial transport medium  Result Value Ref Range Status   SARS Coronavirus 2 by RT PCR NEGATIVE NEGATIVE Final    Comment: (NOTE) SARS-CoV-2 target nucleic acids are NOT DETECTED.  The SARS-CoV-2 RNA is generally detectable in upper respiratory specimens during the acute phase of infection. The lowest concentration of SARS-CoV-2 viral copies this assay can detect is 138 copies/mL. A negative result does not preclude SARS-Cov-2 infection and should not be used as the sole basis for treatment or other patient management decisions. A negative result may occur with  improper specimen collection/handling, submission of specimen other than nasopharyngeal swab, presence of  viral mutation(s) within the areas targeted by this assay, and inadequate number of viral copies(<138 copies/mL). A negative result must be combined with clinical observations, patient history, and epidemiological information. The expected result is Negative.  Fact Sheet for Patients:  EntrepreneurPulse.com.au  Fact Sheet for Healthcare Providers:  IncredibleEmployment.be  This test is no t yet approved or cleared by the [place] FDA and  has been authorized for detection and/or diagnosis of SARS-CoV-2 by FDA under an Emergency Use Authorization (EUA). This EUA will remain  in effect (meaning this test can be used) for the duration of the COVID-19 declaration under Section 564(b)(1) of the Act, 21 U.S.C.section 360bbb-3(b)(1), unless the authorization is terminated  or revoked sooner.       Influenza A by PCR NEGATIVE NEGATIVE Final    Influenza B by PCR NEGATIVE NEGATIVE Final    Comment: (NOTE) The Xpert Xpress SARS-CoV-2/FLU/RSV plus assay is intended as an aid in the diagnosis of influenza from Nasopharyngeal swab specimens and should not be used as a sole basis for treatment. Nasal washings and aspirates are unacceptable for Xpert Xpress SARS-CoV-2/FLU/RSV testing.  Fact Sheet for Patients: EntrepreneurPulse.com.au  Fact Sheet for Healthcare Providers: IncredibleEmployment.be  This test is not yet approved or cleared by the [place] FDA and has been authorized for detection and/or diagnosis of SARS-CoV-2 by FDA under an Emergency Use Authorization (EUA). This EUA will remain in effect (meaning this test can be used) for the duration of the COVID-19 declaration under Section 564(b)(1) of the Act, 21 U.S.C. section 360bbb-3(b)(1), unless the authorization is terminated or revoked.  Performed at [facility], [address] [place], [place] [address]   Blood culture (routine x 2)     Status: None   Collection Time: 11/05/20  2:09 PM   Specimen: BLOOD RIGHT ARM  Result Value Ref Range Status   Specimen Description   Final    BLOOD RIGHT ARM Performed at [facility], [address] [place], [place] [address]    Special Requests   Final    BOTTLES DRAWN AEROBIC AND ANAEROBIC Blood Culture adequate volume Performed at [facility], [address] [place], [place] [address]    Culture   Final    NO GROWTH 5 DAYS Performed at [facility], [place] [address], [place], [place] [address]    Report Status 11/10/2020 FINAL  Final  Blood culture (routine x 2)     Status: None   Collection Time: 11/05/20  2:09 PM   Specimen: Left Antecubital; Blood  Result Value Ref Range Status   Specimen Description   Final    LEFT ANTECUBITAL Performed at [facility], [address] [place], [place] [address]    Special Requests    Final    BOTTLES DRAWN AEROBIC AND ANAEROBIC Blood Culture adequate volume Performed at [facility], [address] [place], [place] [address]    Culture   Final    NO GROWTH 5 DAYS Performed at [facility], [place] [address] [place], [place] [address]    Report Status 11/10/2020 FINAL  Final  Gastrointestinal Panel by PCR , Stool     Status: None   Collection Time: 11/06/20  2:46 PM   Specimen: STOOL  Result Value Ref Range Status   Campylobacter species NOT DETECTED NOT DETECTED Final   Plesimonas shigelloides NOT DETECTED NOT DETECTED Final   Salmonella species  NOT DETECTED NOT DETECTED Final   Yersinia enterocolitica NOT DETECTED NOT DETECTED Final   Vibrio species NOT DETECTED NOT DETECTED Final   Vibrio cholerae NOT DETECTED NOT DETECTED Final   Enteroaggregative E coli (EAEC) NOT DETECTED NOT DETECTED Final   Enteropathogenic E coli (EPEC) NOT DETECTED NOT DETECTED Final   Enterotoxigenic E coli (ETEC) NOT DETECTED NOT DETECTED Final   Shiga like toxin producing E coli (STEC) NOT DETECTED NOT DETECTED Final   Shigella/Enteroinvasive E coli (EIEC) NOT DETECTED NOT DETECTED Final   Cryptosporidium NOT DETECTED NOT DETECTED Final   Cyclospora cayetanensis NOT DETECTED NOT DETECTED Final   Entamoeba histolytica NOT DETECTED NOT DETECTED Final   Giardia lamblia NOT DETECTED NOT DETECTED Final   Adenovirus F40/41 NOT DETECTED NOT DETECTED Final   Astrovirus NOT DETECTED NOT DETECTED Final   Norovirus GI/GII NOT DETECTED NOT DETECTED Final   Rotavirus A NOT DETECTED NOT DETECTED Final   Sapovirus (I, II, IV, and V) NOT DETECTED NOT DETECTED Final    Comment: Performed at [facility], [address] [place], [place] [address]  C Difficile Quick Screen w PCR reflex     Status: None   Collection Time: 11/06/20  2:46 PM   Specimen: STOOL  Result Value Ref Range Status   C Diff antigen NEGATIVE NEGATIVE Final   C Diff toxin NEGATIVE NEGATIVE Final   C  Diff interpretation No C. difficile detected.  Final    Comment: Performed at [facility], [place] [address] [place], [place] [address]      Studies: No results found.  Scheduled Meds:  amLODipine  10 mg Oral Daily   furosemide  40 mg Intravenous BID   hydrALAZINE  25 mg Oral TID   hydrochlorothiazide  12.5 mg Oral Daily   insulin aspart  0-9 Units Subcutaneous TID WC   ipratropium-albuterol  3 mL Nebulization BID   irbesartan  150 mg Oral Daily   loratadine  5 mg Oral Daily   methylPREDNISolone (SOLU-MEDROL) injection  125 mg Intravenous Daily   metoprolol succinate  100 mg Oral Daily   potassium chloride  40 mEq Oral BID    Continuous Infusions:  ceFEPime (MAXIPIME) IV        LOS: 5 days     Annita Brod, MD [facility]   11/10/2020, 9:43 AM

## 2020-11-10 NOTE — Progress Notes (Signed)
SATURATION QUALIFICATIONS: (This note is used to comply with regulatory documentation for home oxygen)  Patient Saturations on Room Air at Rest = 94%  Patient Saturations on Room Air while Ambulating = 85%

## 2020-11-10 NOTE — Plan of Care (Signed)
Lovely reports feeling much better and stronger today. She has been able to keep her O2 sat >90% on RA at rest. She did walk 78ft in the hall today and desat to 83% on RA but recovered to 89-91% once seated at rest. She spent much of the day up in the chair visiting with family. She's been afebrile and denies any pain.  Problem: Education: Goal: Knowledge of General Education information will improve Description: Including pain rating scale, medication(s)/side effects and non-pharmacologic comfort measures Outcome: Progressing   Problem: Health Behavior/Discharge Planning: Goal: Ability to manage health-related needs will improve Outcome: Progressing   Problem: Clinical Measurements: Goal: Ability to maintain clinical measurements within normal limits will improve Outcome: Progressing Goal: Diagnostic test results will improve Outcome: Progressing Goal: Respiratory complications will improve Outcome: Progressing Note: Weaned to RA at rest.  Goal: Cardiovascular complication will be avoided Outcome: Progressing   Problem: Activity: Goal: Risk for activity intolerance will decrease Outcome: Progressing Note: Ambulated 20ft in hallway with CGA and RW.    Problem: Safety: Goal: Ability to remain free from injury will improve Outcome: Progressing

## 2020-11-11 ENCOUNTER — Inpatient Hospital Stay (HOSPITAL_COMMUNITY): Payer: Medicare Other

## 2020-11-11 ENCOUNTER — Ambulatory Visit: Payer: Medicare Other

## 2020-11-11 DIAGNOSIS — J189 Pneumonia, unspecified organism: Secondary | ICD-10-CM | POA: Diagnosis not present

## 2020-11-11 DIAGNOSIS — J9601 Acute respiratory failure with hypoxia: Secondary | ICD-10-CM | POA: Diagnosis not present

## 2020-11-11 DIAGNOSIS — I5033 Acute on chronic diastolic (congestive) heart failure: Secondary | ICD-10-CM | POA: Diagnosis not present

## 2020-11-11 LAB — BASIC METABOLIC PANEL
Anion gap: 9 (ref 5–15)
BUN: 59 mg/dL — ABNORMAL HIGH (ref 8–23)
CO2: 33 mmol/L — ABNORMAL HIGH (ref 22–32)
Calcium: 8.3 mg/dL — ABNORMAL LOW (ref 8.9–10.3)
Chloride: 94 mmol/L — ABNORMAL LOW (ref 98–111)
Creatinine, Ser: 1.68 mg/dL — ABNORMAL HIGH (ref 0.44–1.00)
GFR, Estimated: 28 mL/min — ABNORMAL LOW (ref >60)
Glucose, Bld: 152 mg/dL — ABNORMAL HIGH (ref 70–99)
Potassium: 3.5 mmol/L (ref 3.5–5.1)
Sodium: 136 mmol/L (ref 135–145)

## 2020-11-11 LAB — GLUCOSE, CAPILLARY
Glucose-Capillary: 132 mg/dL — ABNORMAL HIGH (ref 70–99)
Glucose-Capillary: 162 mg/dL — ABNORMAL HIGH (ref 70–99)
Glucose-Capillary: 219 mg/dL — ABNORMAL HIGH (ref 70–99)
Glucose-Capillary: 234 mg/dL — ABNORMAL HIGH (ref 70–99)
Glucose-Capillary: 293 mg/dL — ABNORMAL HIGH (ref 70–99)

## 2020-11-11 LAB — CBC
HCT: 39 % (ref 36.0–46.0)
Hemoglobin: 12 g/dL (ref 12.0–15.0)
MCH: 25.4 pg — ABNORMAL LOW (ref 26.0–34.0)
MCHC: 30.8 g/dL (ref 30.0–36.0)
MCV: 82.6 fL (ref 80.0–100.0)
Platelets: 301 10*3/uL (ref 150–400)
RBC: 4.72 MIL/uL (ref 3.87–5.11)
RDW: 16.3 % — ABNORMAL HIGH (ref 11.5–15.5)
WBC: 10.3 10*3/uL (ref 4.0–10.5)
nRBC: 0 % (ref 0.0–0.2)

## 2020-11-11 MED ORDER — FUROSEMIDE 10 MG/ML IJ SOLN
40.0000 mg | Freq: Every day | INTRAMUSCULAR | Status: DC
  Administered 2020-11-12: 40 mg via INTRAVENOUS
  Filled 2020-11-11: qty 4

## 2020-11-11 NOTE — Assessment & Plan Note (Signed)
-   Complicates overall prognosis and care - Body mass index is 31.21 kg/m.

## 2020-11-11 NOTE — Assessment & Plan Note (Signed)
-   Continue current regimen, will adjust prior to discharge

## 2020-11-11 NOTE — Assessment & Plan Note (Addendum)
-   Underlying opacities unable to be appreciated on admission CXR due to pulmonary edema.  Initial procalcitonin 0.18 which did uptrend slightly -Has been treated with Rocephin which was transitioned to cefepime on 11/10/2020 - repeat CXR on 10/25: Greatly improved aeration, still small bilateral pleural effusions.  No appreciated opacity - d/c abx at this time and monitor; remains afebrile with no leukocytosis and has completed 6 days antibiotics

## 2020-11-11 NOTE — Hospital Course (Addendum)
Ms. Durkee is a 87 year old female with PMH DM II, HTN, COPD, CAD, CKD 3B who presented to the hospital with shortness of breath and lower extremity swelling. On CXR on admission she was found to have possible bilateral lower lobe pneumonia with underlying bilateral pleural effusions. She was started on antibiotics for presumed pneumonia and Lasix for diuresis given underlying volume overload.  She responded well to both treatment regimens.  See below for further A&P.

## 2020-11-11 NOTE — Progress Notes (Signed)
  Patient Details Name: Rebecca [place] MRN: [id number] DOB: 1926-04-18   SATURATION QUALIFICATIONS: (This note is used to comply with regulatory documentation for home oxygen)  Patient Saturations on Room Air at Rest = 94%  Patient Saturations on Room Air while Ambulating = 86%  Patient Saturations on 2 Liters of oxygen while Ambulating = 95%  Please briefly explain why patient needs home oxygen: to improve oxygen saturations during physical activities such as ambulation.   [name] 11/11/2020, 11:44 AM Arlyce Dice, DPT Acute Rehabilitation Services Pager: [phone] Office: [phone]

## 2020-11-11 NOTE — Assessment & Plan Note (Addendum)
-   treated with steroids and breathing treatments in hospital

## 2020-11-11 NOTE — Assessment & Plan Note (Signed)
-   continue SSI and CBG monitoring  

## 2020-11-11 NOTE — Progress Notes (Signed)
Progress Note    Rebecca [place]   VQQ:[id number]  DOB: 29-Apr-1926  DOA: 11/05/2020     6 Date of Service: 11/11/2020   Clinical Course [name] is a 87 year old female with PMH DM II, HTN, COPD, CAD, CKD 3B who presented to the hospital with shortness of breath and lower extremity swelling. On CXR on admission she was found to have possible bilateral lower lobe pneumonia with underlying bilateral pleural effusions. She was started on antibiotics for presumed pneumonia and Lasix for diuresis given underlying volume overload.  She responded well to both treatment regimens.  Assessment and Plan * Community acquired bilateral lower lobe pneumonia - Underlying opacities unable to be appreciated on admission CXR due to pulmonary edema.  Initial procalcitonin 0.18 which did uptrend slightly -Has been treated with Rocephin which was transitioned to cefepime on 11/10/2020 - repeat CXR on 10/25: Greatly improved aeration, still small bilateral pleural effusions.  No appreciated opacity - d/c abx at this time and monitor; remains afebrile with no leukocytosis and has completed 6 days antibiotics  Acute respiratory failure with hypoxia (HCC) - Considered multifactorial from underlying pulmonary edema, possible infection, and COPD exacerbation - Has responded well to multimodality treatments - Currently on room air at rest but still has hypoxia with exertion when ambulating in the hall - Continue monitoring 1 more night and repeat walk test tomorrow.  If still hypoxic, will arrange for home O2  Acute on chronic diastolic CHF (congestive heart failure) ([place]) - CXR repeated on 11/11/2020 shows significant improvement in volume overload from admission x-ray - Continue Lasix -Echo shows EF 55 to 43%, grade 2 diastolic dysfunction - DC HCTZ  COPD with acute exacerbation (HCC) - Continue Solu-Medrol and breathing treatments  Obesity (BMI 32-95.1) - Complicates overall prognosis and care - Body  mass index is 31.21 kg/m.  History of anemia due to CKD - in setting of CKD -Baseline around 10-11 g/dL, currently at baseline  CKD (chronic kidney disease) stage 3, GFR 30-59 ml/min (HCC) - patient has history of CKD3b. Baseline creat ~ 1.4, eGFR 30   Hypomagnesemia - Repleting as needed  Hypokalemia - Repleting as needed  Essential hypertension - Continue current regimen, will adjust prior to discharge  Type 2 diabetes mellitus with diabetic neuropathy, unspecified ([place]) - continue SSI and CBG monitoring   Diarrhea-resolved as of 11/11/2020 - negative Cdiff - resolved     Subjective:  No events overnight.  Daughters are present this morning.  She ambulated in the hallway well, still with some hypoxia noted with exertion.  Oxygen saturations are normal when at rest in bed.  We decided on continuing hospitalization 1 more night and repeat walk test tomorrow, if still needing oxygen with exertion, will arrange for home O2.  Patient and family agreeable with plan.  Objective Vitals:   11/11/20 0641 11/11/20 0837 11/11/20 0935 11/11/20 1120  BP: (!) 161/52  (!) 155/48 (!) 148/50  Pulse: 64  69 61  Resp: _0 Temp: 97.8 F (36.6 C)  97.8 F (36.6 C) 97.7 F (36.5 C)  TempSrc: Oral  Oral Oral  SpO2: 92% 93% 95% 93%  Weight:   87.7 kg   Height:       87.7 kg  Vital signs were reviewed and unremarkable.   Exam Physical Exam Constitutional:      General: She is not in acute distress.    Appearance: She is well-developed.  HENT:     Head: Normocephalic and  atraumatic.  Eyes:     Extraocular Movements: Extraocular movements intact.  Cardiovascular:     Rate and Rhythm: Normal rate and regular rhythm.  Pulmonary:     Effort: Pulmonary effort is normal.     Breath sounds: Examination of the right-lower field reveals decreased breath sounds and rales. Examination of the left-lower field reveals decreased breath sounds and rales. Decreased breath sounds and  rales present.  Chest:     Chest wall: No edema.  Abdominal:     General: Bowel sounds are normal.     Palpations: Abdomen is soft.     Tenderness: There is no abdominal tenderness.  Musculoskeletal:        General: Normal range of motion.     Cervical back: Normal range of motion and neck supple.     Right lower leg: No edema.     Left lower leg: No edema.  Skin:    General: Skin is warm and dry.  Neurological:     General: No focal deficit present.     Mental Status: She is alert.  Psychiatric:        Mood and Affect: Mood normal.        Behavior: Behavior normal.     Labs / Other Information My review of labs, imaging, notes and other tests shows no new significant findings.    Disposition Plan: Status is: Inpatient  Remains inpatient appropriate because: ongoing diuresis       Time spent: Greater than 50% of the 35 minute visit was spent in counseling/coordination of care for the patient as laid out in the A&P.  Dwyane Dee, MD [facility] 11/11/2020, 4:15 PM

## 2020-11-11 NOTE — Assessment & Plan Note (Signed)
-  patient has history of CKD3b. Baseline creat ~ 1.4, eGFR 30

## 2020-11-11 NOTE — Plan of Care (Signed)
Rebecca [place] spent much of today up in the chair. She ambulated in the hall with PT and RW. Her O2 sat at rest is 94-98% on RA.  She did desat to 86% on RA with ambulation but came up to 94% on 2lnc with ambulation. She is eager for d/c home tomorrow after another attempt at a walking desat test.  Problem: Education: Goal: Knowledge of General Education information will improve Description: Including pain rating scale, medication(s)/side effects and non-pharmacologic comfort measures Outcome: Progressing   Problem: Health Behavior/Discharge Planning: Goal: Ability to manage health-related needs will improve Outcome: Progressing   Problem: Clinical Measurements: Goal: Ability to maintain clinical measurements within normal limits will improve Outcome: Progressing Goal: Diagnostic test results will improve Outcome: Progressing Note: CXR today improved.  Goal: Respiratory complications will improve Outcome: Progressing Note: RA at rest, 2lnc with activity.  Goal: Cardiovascular complication will be avoided Outcome: Progressing   Problem: Activity: Goal: Risk for activity intolerance will decrease Outcome: Progressing   Problem: Safety: Goal: Ability to remain free from injury will improve Outcome: Progressing

## 2020-11-11 NOTE — Assessment & Plan Note (Addendum)
-   Considered multifactorial from underlying pulmonary edema, possible infection, and COPD exacerbation - Has responded well to multimodality treatments - Currently on room air at rest but still has hypoxia with exertion when ambulating in the hall to mid 80s. Home O2 arranged at discharge - assess further need for O2 at follow up

## 2020-11-11 NOTE — Assessment & Plan Note (Addendum)
-   CXR repeated on 11/11/2020 shows significant improvement in volume overload from admission x-ray - Continue Lasix -Echo shows EF 55 to 25%, grade 2 diastolic dysfunction - DC HCTZ - lasix continued at discharge

## 2020-11-11 NOTE — Assessment & Plan Note (Addendum)
-   Repleting as needed

## 2020-11-11 NOTE — Progress Notes (Signed)
Patient's nighttime CBG was 335. Reached out to on call provider since patient did not have any coverage. On call provider gave an order to give a one time dose of 5 units of Novolog subcutaneous and to recheck CBG at midnight. CBG was taken at 0007 and it was 234.

## 2020-11-11 NOTE — Assessment & Plan Note (Signed)
-   negative Cdiff - resolved

## 2020-11-11 NOTE — Progress Notes (Signed)
Occupational Therapy Treatment Patient Details Name: Rebecca Elliott MRN: [id number] DOB: Mar 11, 1926 Today's Date: 11/11/2020   History of present illness 87 yo female admitted with Pna, CHF, COPD exac. Hx of DM, COPD, CAD, CKD, OA, anemia   OT comments  Patient completed UB/LB bathing tasks with min guard with cues for sequencing for safety at times. Patient was educated on deep breathing when SOB was noted. Patient completed task on RA with patient noted to drop to 84% on RA x 2 with patient deep breathing O2 saturation back up to 90s within 30 seconds. Patients daughters in room educated on importance of monitoring SOB, cues for deep breathing, and locations where pulse Ox can be purchased. Patient's discharge plan remains appropriate at this time. OT will continue to follow acutely.     Recommendations for follow up therapy are one component of a multi-disciplinary discharge planning process, led by the attending physician.  Recommendations may be updated based on patient status, additional functional criteria and insurance authorization.    Follow Up Recommendations  Home health OT    Assistance Recommended at Discharge Frequent or constant Supervision/Assistance  Equipment Recommendations  [facility]    Recommendations for Other Services      Precautions / Restrictions Precautions Precautions: Fall Precaution Comments: monitor O2 Restrictions Weight Bearing Restrictions: No       Mobility Bed Mobility               General bed mobility comments: pt sitting in recliner with daughters in room on arrival    Transfers Overall transfer level: Needs assistance Equipment used: Rolling walker (2 wheels) Transfers: Sit to/from Stand Sit to Stand: Min guard           General transfer comment: cues for proper hand and foot placement for transfers     Balance Overall balance assessment: Needs assistance Sitting-balance support: No upper extremity supported;Feet  supported Sitting balance-Leahy Scale: Good     Standing balance support: During functional activity Standing balance-Leahy Scale: Fair                             ADL either performed or assessed with clinical judgement   ADL Overall ADL's : Needs assistance/impaired     Grooming: Wash/dry hands;Min guard;Standing   Upper Body Bathing: Supervision/ safety;Sitting Upper Body Bathing Details (indicate cue type and reason): seated Lower Body Bathing: Sit to/from stand;Min guard Lower Body Bathing Details (indicate cue type and reason): with education for deep breathing when SOB was noted. patient dropped to 84% on RA with patient able to deep breath O2 saturation back up within 30 seconds. family in room educated on importance of monitoring patient and education on cues to deep breath. Upper Body Dressing : Min guard;Sitting   Lower Body Dressing: Min guard Lower Body Dressing Details (indicate cue type and reason): sitting on commode to don new pair of underwear and don/doff slippers x2 Toilet Transfer: Min guard;Rolling walker (2 wheels) Toilet Transfer Details (indicate cue type and reason): into bathroom Toileting- Clothing Manipulation and Hygiene: Supervision/safety;Sit to/from stand       Functional mobility during ADLs: Min guard;Rolling walker (2 wheels) General ADL Comments: patient needed continued education on deep breathing when SOB was noted.     Vision Baseline Vision/History: 1 Wears glasses     Perception     Praxis      Cognition Arousal/Alertness: Awake/alert Behavior During Therapy: WFL for tasks assessed/performed Overall Cognitive  Status: Within Functional Limits for tasks assessed                                 General Comments: daughters present in room          Exercises     Shoulder Instructions       General Comments      Pertinent Vitals/ Pain       Pain Assessment: No/denies pain  Home Living                                           Prior Functioning/Environment              Frequency  Min 2X/week        Progress Toward Goals  OT Goals(current goals can now be found in the care plan section)  Progress towards OT goals: Progressing toward goals  Acute Rehab OT Goals OT Goal Formulation: With patient Time For Goal Achievement: 11/23/20 Potential to Achieve Goals: Good  Plan Discharge plan remains appropriate    Co-evaluation                 AM-PAC OT "6 Clicks" Daily Activity     Outcome Measure   Help from another person eating meals?: None Help from another person taking care of personal grooming?: None Help from another person toileting, which includes using toliet, bedpan, or urinal?: A Little Help from another person bathing (including washing, rinsing, drying)?: A Little Help from another person to put on and taking off regular upper body clothing?: A Little Help from another person to put on and taking off regular lower body clothing?: A Little 6 Click Score: 20    End of Session Equipment Utilized During Treatment: Gait belt;Rolling walker (2 wheels)  OT Visit Diagnosis: Unsteadiness on feet (R26.81);Other abnormalities of gait and mobility (R26.89);Muscle weakness (generalized) (M62.81)   Activity Tolerance Patient tolerated treatment well   Patient Left in chair;with call bell/phone within reach;with family/visitor present   Nurse Communication          Time: 2924-4628 OT Time Calculation (min): 28 min  Charges: OT General Charges $OT Visit: 1 Visit OT Treatments $Self Care/Home Management : 23-37 mins  Jackelyn Poling OTR/L, [facility] Acute Rehabilitation Department Office# [phone] Pager# [phone]   [place] 11/11/2020, 4:32 PM

## 2020-11-11 NOTE — Progress Notes (Signed)
Physical Therapy Treatment Patient Details Name: Rebecca Elliott MRN: [id number] DOB: 1926-05-01 Today's Date: 11/11/2020   History of Present Illness 87 yo female admitted with Pna, CHF, COPD exac. Hx of DM, COPD, CAD, CKD, OA, anemia    PT Comments    Pt assisted with ambulating in hallway and observed oxygen saturations.  Pt requiring 2L O2 [place] during ambulation.  Daughter present for session and ambulation. Both daughter and pt educated on current SPO2 at rest and with exertion and current liters needed for exertional tasks.  RN notified as well.    Recommendations for follow up therapy are one component of a multi-disciplinary discharge planning process, led by the attending physician.  Recommendations may be updated based on patient status, additional functional criteria and insurance authorization.  Follow Up Recommendations  Home health PT     Assistance Recommended at Discharge Intermittent Supervision/Assistance  Equipment Recommendations  None recommended by PT    Recommendations for Other Services       Precautions / Restrictions Precautions Precautions: Fall Precaution Comments: monitor O2 Restrictions Weight Bearing Restrictions: No     Mobility  Bed Mobility               General bed mobility comments: pt sitting in recliner with daughters in room on arrival    Transfers Overall transfer level: Needs assistance Equipment used: Rolling walker (2 wheels) Transfers: Sit to/from Stand Sit to Stand: Min guard           General transfer comment: cues for waiting for RW for safety and use of UEs to self assist    Ambulation/Gait Ambulation/Gait assistance: Min guard Gait Distance (Feet): 120 Feet Assistive device: Rolling walker (2 wheels) Gait Pattern/deviations: Step-through pattern;Decreased stride length Gait velocity: decr   General Gait Details: pt requiring supplemental oxygen (see O2 qualification progress note), pt also reports mild  dyspnea, cues for pursed lip breathing with one standing rest break (also to apply O2 [employer]); pt mostly c/o of knee pain limiting mobility   Stairs             Wheelchair Mobility    Modified Rankin (Stroke Patients Only)       Balance                                            Cognition Arousal/Alertness: Awake/alert Behavior During Therapy: WFL for tasks assessed/performed Overall Cognitive Status: Within Functional Limits for tasks assessed                                          Exercises      General Comments        Pertinent Vitals/Pain Pain Assessment: No/denies pain    Home Living                          Prior Function            PT Goals (current goals can now be found in the care plan section) Progress towards PT goals: Progressing toward goals    Frequency    Min 3X/week      PT Plan Current plan remains appropriate    Co-evaluation  AM-PAC PT "6 Clicks" Mobility   Outcome Measure  Help needed turning from your back to your side while in a flat bed without using bedrails?: A Little Help needed moving from lying on your back to sitting on the side of a flat bed without using bedrails?: A Little Help needed moving to and from a bed to a chair (including a wheelchair)?: A Little Help needed standing up from a chair using your arms (e.g., wheelchair or bedside chair)?: A Little Help needed to walk in hospital room?: A Little Help needed climbing 3-5 steps with a railing? : A Little 6 Click Score: 18    End of Session Equipment Utilized During Treatment: Gait belt;Oxygen Activity Tolerance: Patient tolerated treatment well Patient left: in chair;with call bell/phone within reach;with family/visitor present Nurse Communication: Mobility status PT Visit Diagnosis: Difficulty in walking, not elsewhere classified (R26.2);Muscle weakness (generalized) (M62.81)     Time:  5800-6349 PT Time Calculation (min) (ACUTE ONLY): 13 min  Charges:  $Gait Training: 8-22 mins                    Jannette Spanner PT, DPT Acute Rehabilitation Services Pager: [phone] Office: [place] 11/11/2020, 1:11 PM

## 2020-11-11 NOTE — Plan of Care (Signed)
  Problem: Education: Goal: Knowledge of General Education information will improve Description Including pain rating scale, medication(s)/side effects and non-pharmacologic comfort measures Outcome: Progressing   Problem: Activity: Goal: Risk for activity intolerance will decrease Outcome: Progressing   Problem: Safety: Goal: Ability to remain free from injury will improve Outcome: Progressing   

## 2020-11-11 NOTE — Assessment & Plan Note (Signed)
-   in setting of CKD -Baseline around 10-11 g/dL, currently at baseline

## 2020-11-11 NOTE — Assessment & Plan Note (Signed)
-   Repleting as needed

## 2020-11-12 DIAGNOSIS — J189 Pneumonia, unspecified organism: Secondary | ICD-10-CM | POA: Diagnosis not present

## 2020-11-12 DIAGNOSIS — J9601 Acute respiratory failure with hypoxia: Secondary | ICD-10-CM | POA: Diagnosis not present

## 2020-11-12 DIAGNOSIS — I5033 Acute on chronic diastolic (congestive) heart failure: Secondary | ICD-10-CM | POA: Diagnosis not present

## 2020-11-12 LAB — GLUCOSE, CAPILLARY
Glucose-Capillary: 127 mg/dL — ABNORMAL HIGH (ref 70–99)
Glucose-Capillary: 201 mg/dL — ABNORMAL HIGH (ref 70–99)

## 2020-11-12 MED ORDER — METOPROLOL SUCCINATE ER 100 MG PO TB24
100.0000 mg | ORAL_TABLET | Freq: Every day | ORAL | 3 refills | Status: DC
Start: 2020-11-13 — End: 2021-02-10

## 2020-11-12 MED ORDER — FUROSEMIDE 40 MG PO TABS: 40.0000 mg | ORAL_TABLET | Freq: Every day | ORAL | 3 refills | Status: DC

## 2020-11-12 NOTE — Progress Notes (Signed)
Occupational Therapy Treatment Patient Details Name: Rebecca Elliott MRN: [id number] DOB: 22-Nov-1926 Today's Date: 11/12/2020   History of present illness 87 yo female admitted with Pna, CHF, COPD exac. Hx of DM, COPD, CAD, CKD, OA, anemia   OT comments  Pt with progress towards goals this session. Pt performed sit to stand and functional mobility with RW and min guard house distance in hallway. Pt recalled corrected method for pursed lip breathing from previous OT session with no vcs. Pt SpO2 92% on room air at rest upon arrival, SpO2 dropping to 86% upon sit to stand and functional mobility outside room. Returned to 2L and encouraged pursed lip breathing/standing rest break, SpO2 returning to 93% and maintaining above 92% during remainder of functional mobility. Educated on pt safety at home with O2 line and reviewed energy conservation techniques. Continuing to recommend [place], all education completed in preparation for possible d/c today.   Recommendations for follow up therapy are one component of a multi-disciplinary discharge planning process, led by the attending physician.  Recommendations may be updated based on patient status, additional functional criteria and insurance authorization.    Follow Up Recommendations  Home health OT    Assistance Recommended at Discharge Frequent or constant Supervision/Assistance  Equipment Recommendations  [facility]    Recommendations for Other Services      Precautions / Restrictions Precautions Precautions: Fall Precaution Comments: monitor O2 Restrictions Weight Bearing Restrictions: No       Mobility Bed Mobility               General bed mobility comments: pt sitting in recliner with daughter in room upon arrival    Transfers Overall transfer level: Needs assistance Equipment used: Rolling walker (2 wheels) Transfers: Sit to/from Stand Sit to Stand: Min guard           General transfer comment: for safety     Balance  Overall balance assessment: Needs assistance Sitting-balance support: No upper extremity supported;Feet supported Sitting balance-Leahy Scale: Good     Standing balance support: During functional activity;Bilateral upper extremity supported Standing balance-Leahy Scale: Fair Standing balance comment: required BUE support during functional mobility                           ADL either performed or assessed with clinical judgement   ADL Overall ADL's : Needs assistance/impaired                                     Functional mobility during ADLs: Min guard;Rolling walker (2 wheels)       Vision       Perception     Praxis      Cognition Arousal/Alertness: Awake/alert Behavior During Therapy: WFL for tasks assessed/performed Overall Cognitive Status: Within Functional Limits for tasks assessed                                            Exercises     Shoulder Instructions       General Comments      Pertinent Vitals/ Pain       Pain Assessment: No/denies pain  Home Living  Prior Functioning/Environment              Frequency  Min 2X/week        Progress Toward Goals  OT Goals(current goals can now be found in the care plan section)  Progress towards OT goals: Progressing toward goals  Acute Rehab OT Goals OT Goal Formulation: With patient Time For Goal Achievement: 11/23/20 Potential to Achieve Goals: Good ADL Goals Pt Will Perform Grooming: with supervision;standing Pt Will Perform Upper Body Dressing: with modified independence;sitting Pt Will Perform Lower Body Dressing: with supervision;sit to/from stand;with caregiver independent in assisting Pt Will Transfer to Toilet: with supervision;ambulating Pt Will Perform Toileting - Clothing Manipulation and hygiene: with supervision;sit to/from stand Additional ADL Goal #1: Pt will verbalize 3  strategies for energy conservation during ADL routine at independent level  Plan Discharge plan remains appropriate    Co-evaluation                 AM-PAC OT "6 Clicks" Daily Activity     Outcome Measure   Help from another person eating meals?: None Help from another person taking care of personal grooming?: None Help from another person toileting, which includes using toliet, bedpan, or urinal?: A Little Help from another person bathing (including washing, rinsing, drying)?: A Little Help from another person to put on and taking off regular upper body clothing?: A Little Help from another person to put on and taking off regular lower body clothing?: A Little 6 Click Score: 20    End of Session Equipment Utilized During Treatment: Gait belt;Rolling walker (2 wheels)  OT Visit Diagnosis: Unsteadiness on feet (R26.81);Other abnormalities of gait and mobility (R26.89);Muscle weakness (generalized) (M62.81)   Activity Tolerance Patient tolerated treatment well   Patient Left in chair;with call bell/phone within reach;with family/visitor present   Nurse Communication Mobility status        Time: 3704-8889 OT Time Calculation (min): 31 min  Charges: OT General Charges $OT Visit: 1 Visit OT Treatments $Therapeutic Activity: 23-37 mins  Jackquline Denmark, OTS Acute Rehab Office: [phone]   Chariah Bailey 11/12/2020, 1:19 PM

## 2020-11-12 NOTE — Progress Notes (Signed)
D/c instructions reviewed w/ pt and her daughter, [name]. Both verbalize understanding and all questions answered. Both aware that case manager is arranging for home O2 and BSC delivery and agreeable.

## 2020-11-12 NOTE — Progress Notes (Signed)
Chaplain was paged and told that patient wished to fill out a HCPOA.  Chaplain reviewed Advance Directives with patient, assisted her with filling it out and facilitated getting the document notarized.  Copies were made for patient and a copy was placed in her chart.  [place], Bcc Pager, [phone] 11:00 AM

## 2020-11-12 NOTE — Plan of Care (Signed)
    Problem: Education: Goal: Knowledge of General Education information will improve Description: Including pain rating scale, medication(s)/side effects and non-pharmacologic comfort measures Outcome: Adequate for Discharge   Problem: Health Behavior/Discharge Planning: Goal: Ability to manage health-related needs will improve Outcome: Adequate for Discharge   Problem: Activity: Goal: Risk for activity intolerance will decrease Outcome: Adequate for Discharge   

## 2020-11-12 NOTE — Discharge Summary (Signed)
Physician Discharge Summary   Patient name: CHARLOTTA LAPAGLIA  Admit date:     11/05/2020  Discharge date: 11/12/2020  Discharge Physician: Dwyane Dee   PCP: Colon Branch, MD   Recommendations at discharge:  Repeat BMP Discharge with Home O2 due to hypoxia only with exertion. Follow up further need for O2   Discharge Diagnoses Active Problems:   Acute on chronic diastolic CHF (congestive heart failure) (HCC)   Acute respiratory failure with hypoxia (HCC)   Type 2 diabetes mellitus with diabetic neuropathy, unspecified (HCC)   Essential hypertension   Hypomagnesemia   CKD (chronic kidney disease) stage 3, GFR 30-59 ml/min (HCC)   History of anemia due to CKD   Obesity (BMI 30-39.9)   Resolved Diagnoses Principal Problem (Resolved):   Community acquired bilateral lower lobe pneumonia Resolved Problems:   COPD with acute exacerbation ([place])   Hypokalemia   Diarrhea   Hospital Course   Ms. Mcnew is a 87 year old female with PMH DM II, HTN, COPD, CAD, CKD 3B who presented to the hospital with shortness of breath and lower extremity swelling. On CXR on admission she was found to have possible bilateral lower lobe pneumonia with underlying bilateral pleural effusions. She was started on antibiotics for presumed pneumonia and Lasix for diuresis given underlying volume overload.  She responded well to both treatment regimens.  See below for further A&P.    * Community acquired bilateral lower lobe pneumonia-resolved as of 11/12/2020 - Underlying opacities unable to be appreciated on admission CXR due to pulmonary edema.  Initial procalcitonin 0.18 which did uptrend slightly -Has been treated with Rocephin which was transitioned to cefepime on 11/10/2020 - repeat CXR on 10/25: Greatly improved aeration, still small bilateral pleural effusions.  No appreciated opacity - d/c abx at this time and monitor; remains afebrile with no leukocytosis and has completed 6 days  antibiotics  Acute respiratory failure with hypoxia (HCC) - Considered multifactorial from underlying pulmonary edema, possible infection, and COPD exacerbation - Has responded well to multimodality treatments - Currently on room air at rest but still has hypoxia with exertion when ambulating in the hall - Continue monitoring 1 more night and repeat walk test tomorrow.  If still hypoxic, will arrange for home O2  Acute on chronic diastolic CHF (congestive heart failure) ([place]) - CXR repeated on 11/11/2020 shows significant improvement in volume overload from admission x-ray - Continue Lasix -Echo shows EF 55 to 82%, grade 2 diastolic dysfunction - DC HCTZ - lasix continued at discharge   COPD with acute exacerbation (HCC)-resolved as of 11/12/2020 - treated with steroids and breathing treatments in hospital   Obesity (BMI 50-53.9) - Complicates overall prognosis and care - Body mass index is 31.21 kg/m.  History of anemia due to CKD - in setting of CKD -Baseline around 10-11 g/dL, currently at baseline  CKD (chronic kidney disease) stage 3, GFR 30-59 ml/min (HCC) - patient has history of CKD3b. Baseline creat ~ 1.4, eGFR 30   Hypomagnesemia - Repleting as needed  Essential hypertension - Continue current regimen, will adjust prior to discharge  Type 2 diabetes mellitus with diabetic neuropathy, unspecified ([place]) - continue SSI and CBG monitoring   Diarrhea-resolved as of 11/11/2020 - negative Cdiff - resolved   Hypokalemia-resolved as of 11/12/2020 - Repleting as needed     Procedures performed: n/a   Condition at discharge: stable  Exam Physical Exam Constitutional:      Appearance: Normal appearance.  HENT:     Head:  Normocephalic and atraumatic.     Mouth/Throat:     Mouth: Mucous membranes are moist.  Eyes:     Extraocular Movements: Extraocular movements intact.  Cardiovascular:     Rate and Rhythm: Normal rate and regular rhythm.     Heart sounds:  Normal heart sounds.  Pulmonary:     Comments: Decreased bibasilar breath sounds, no wheezing  Abdominal:     General: Bowel sounds are normal. There is no distension.     Palpations: Abdomen is soft.     Tenderness: There is no abdominal tenderness.  Musculoskeletal:        General: No swelling. Normal range of motion.     Cervical back: Normal range of motion and neck supple.     Right lower leg: No edema.     Left lower leg: No edema.  Skin:    General: Skin is warm and dry.  Neurological:     General: No focal deficit present.     Mental Status: She is alert.  Psychiatric:        Mood and Affect: Mood normal.        Behavior: Behavior normal.     Disposition: Home  Discharge time: greater than 30 minutes.  Follow-up Information     [facility] Follow up.   Why: Your Oxygen and Bed side Commode is from this company. Please call [facility] [employer] if you have any questions or problems. Contact information: [address] [place] [address] [facility].   Why: This company will service you for Home Health Physical Therapy. Contact information: [address] [address] [phone]                 Allergies as of 11/12/2020   No Known Allergies      Medication List     STOP taking these medications    hydrochlorothiazide 25 MG tablet Commonly known as: HYDRODIURIL       TAKE these medications    acetaminophen 500 MG tablet Commonly known as: TYLENOL Take 500 mg by mouth every 6 (six) hours as needed for mild pain, headache or fever.   albuterol 108 (90 Base) MCG/ACT inhaler Commonly known as: VENTOLIN HFA Inhale 2 puffs into the lungs every 6 (six) hours as needed for wheezing or shortness of breath.   amLODipine 10 MG tablet Commonly known as: NORVASC Take 10 mg by mouth daily.   arformoterol 15 MCG/2ML Nebu Commonly known as: BROVANA Take 2 mLs  (15 mcg total) by nebulization in the morning and at bedtime.   atorvastatin 10 MG tablet Commonly known as: LIPITOR Take 1 tablet (10 mg total) by mouth daily.   budesonide 0.25 MG/2ML nebulizer solution Commonly known as: PULMICORT TAKE 2 ML VIA NEBULIZATION IN THE MORNING AND AT BEDTIME What changed: See the new instructions.   Centrum Silver Chew Chew 1 tablet by mouth daily.   dextromethorphan-guaiFENesin 30-600 MG 12hr tablet Commonly known as: MUCINEX DM Take 1 tablet by mouth daily.   diclofenac sodium 1 % Gel Commonly known as: VOLTAREN Apply 2 g topically 4 (four) times daily.   docusate sodium 100 MG capsule Commonly known as: COLACE Take 100-200 mg by mouth daily as needed for mild constipation.   famotidine 20 MG tablet Commonly known as: PEPCID Take 1 tablet (20 mg total) by mouth 2 (two) times daily.  furosemide 40 MG tablet Commonly known as: Lasix Take 1 tablet (40 mg total) by mouth daily.   glimepiride 2 MG tablet Commonly known as: AMARYL Take 1 tablet (2 mg total) by mouth daily with breakfast.   hydrALAZINE 25 MG tablet Commonly known as: APRESOLINE Take 1 tablet (25 mg total) by mouth 3 (three) times daily.   ipratropium-albuterol 0.5-2.5 (3) MG/3ML Soln Commonly known as: DUONEB Take 3 mLs by nebulization every 6 (six) hours as needed. What changed: reasons to take this   loratadine 10 MG tablet Commonly known as: CLARITIN Take 10 mg by mouth daily.   metoprolol succinate 100 MG 24 hr tablet Commonly known as: Toprol XL Take 1 tablet (100 mg total) by mouth daily. Take with or immediately following a meal. Start taking on: November 13, 2020 What changed: Another medication with the same name was removed. Continue taking this medication, and follow the directions you see here.   NONFORMULARY OR COMPOUNDED ITEM Compression Stockings Dx: M79.89   pantoprazole 40 MG tablet Commonly known as: PROTONIX Take 1 tablet (40 mg total) by  mouth every other day.   sitaGLIPtin 50 MG tablet Commonly known as: Januvia Take 1 tablet (50 mg total) by mouth daily.   valsartan 160 MG tablet Commonly known as: DIOVAN TAKE 1 TABLET BY MOUTH EVERY DAY   VITAMIN B-12 PO Take 1 tablet by mouth daily.               Durable Medical Equipment  (From admission, onward)           Start     Ordered   11/12/20 1320  For home use only DME oxygen  Once       Question Answer Comment  Length of Need 6 Months   Mode or (Route) Nasal cannula   Liters per Minute 2   Frequency Continuous (stationary and portable oxygen unit needed)   Oxygen conserving device Yes   Oxygen delivery system Gas      11/12/20 1319   11/11/20 1234  For home use only DME Bedside commode  Once       Question:  Patient needs a bedside commode to treat with the following condition  Answer:  Fear for personal safety   11/11/20 1234            CT ABDOMEN PELVIS WO CONTRAST  Result Date: 11/05/2020 CLINICAL DATA:  Abdominal pain, acute, nonlocalized. Diarrhea. Shortness of breath. EXAM: CT ABDOMEN AND PELVIS WITHOUT CONTRAST TECHNIQUE: Multidetector CT imaging of the abdomen and pelvis was performed following the standard protocol without IV contrast. COMPARISON:  08/06/2014 FINDINGS: Lower chest: Pleural effusion on the right layering dependently. Atelectasis and or infiltrate at both lung bases, right more than left. Hepatobiliary: Previous cholecystectomy. Liver parenchyma appears normal. Pancreas: Normal Spleen: Normal Adrenals/Urinary Tract: Adrenal glands are normal. Renal cysts on the right. No evidence of stone or hydronephrosis. Bladder is normal. Stomach/Bowel: Stomach and small intestine are unremarkable. Extensive diverticulosis of the colon without visible diverticulitis. Vascular/Lymphatic: Aortic atherosclerosis. No aneurysm. IVC is normal. No adenopathy. Reproductive: Previous hysterectomy.  No pelvic mass. Other: No free fluid or air.  Musculoskeletal: Chronic degenerative changes throughout the lumbar region. Chronic osteitis pubis with fusion. Bilateral sacroiliac osteoarthritis. IMPRESSION: No acute intra-abdominal finding. Extensive diverticulosis but without definite diverticulitis. Low level diverticulitis can be inapparent at imaging. Right pleural effusion. Bilateral lower lobe atelectasis/infiltrate. Previous cholecystectomy. Aortic Atherosclerosis (ICD10-I70.0). Electronically Signed   By: Jan Fireman.D.  On: 11/05/2020 12:06   DG CHEST PORT 1 VIEW  Result Date: 11/11/2020 CLINICAL DATA:  Community acquired pneumonia.  Pulmonary edema. EXAM: PORTABLE CHEST 1 VIEW COMPARISON:  Chest x-ray 11/05/2020. FINDINGS: The heart is enlarged, unchanged. There are small bilateral pleural effusions, slightly decreased. There is no focal lung consolidation or pneumothorax. No acute fractures are seen. IMPRESSION: 1. Small bilateral pleural effusions have decreased. 2. Stable cardiomegaly. Electronically Signed   By: Ronney Asters M.D.   On: 11/11/2020 15:09   DG Chest Port 1 View  Result Date: 11/05/2020 CLINICAL DATA:  Shortness of breath EXAM: PORTABLE CHEST 1 VIEW COMPARISON:  08/06/2020 FINDINGS: Lordotic position film. Under penetration. Cardiomegaly and aortic atherosclerosis again seen. The upper lungs are clear. Question bilateral lower lobe infiltrates and or bilateral pleural effusions with volume loss. Two-view chest radiography recommended when able. IMPRESSION: Technical issues as above. Question bilateral lower lobe pneumonia and or bilateral effusions and atelectasis. Two-view chest radiography recommended when able. Electronically Signed   By: Nelson Chimes M.D.   On: 11/05/2020 12:02   ECHOCARDIOGRAM COMPLETE  Result Date: 11/06/2020    ECHOCARDIOGRAM REPORT   Patient Name:   SERIAH BROTZMAN Date of Exam: 11/06/2020 Medical Rec #:  [id number]      Height:       66.0 in Accession #:    [id number]     Weight:       195.8  lb Date of Birth:  [birth year]      BSA:          1.982 m Patient Age:    87 years       BP:           167/76 mmHg Patient Gender: F              HR:           85 bpm. Exam Location:  Inpatient Procedure: 2D Echo, Cardiac Doppler and Color Doppler Indications:    SOB  History:        Patient has no prior history of Echocardiogram examinations.                 Risk Factors:Diabetes and Hypertension.  Sonographer:    Helmut Muster Referring Phys: [place]  1. Left ventricular ejection fraction, by estimation, is 55 to 60%. The left ventricle has normal function. The left ventricle has no regional wall motion abnormalities. There is mild left ventricular hypertrophy. Left ventricular diastolic parameters are consistent with Grade II diastolic dysfunction (pseudonormalization).  2. Right ventricular systolic function is normal. The right ventricular size is normal.  3. Left atrial size was moderately dilated.  4. The mitral valve is normal in structure. Mild mitral valve regurgitation. No evidence of mitral stenosis. Moderate mitral annular calcification.  5. The aortic valve is tricuspid. There is mild calcification of the aortic valve. Aortic valve regurgitation is mild. Mild aortic valve stenosis.  6. The inferior vena cava is dilated in size with >50% respiratory variability, suggesting right atrial pressure of 8 mmHg. FINDINGS  Left Ventricle: Left ventricular ejection fraction, by estimation, is 55 to 60%. The left ventricle has normal function. The left ventricle has no regional wall motion abnormalities. The left ventricular internal cavity size was normal in size. There is  mild left ventricular hypertrophy. Left ventricular diastolic parameters are consistent with Grade II diastolic dysfunction (pseudonormalization). Right Ventricle: The right ventricular size is normal. No increase in right ventricular wall thickness. Right ventricular  systolic function is normal. Left Atrium: Left  atrial size was moderately dilated. Right Atrium: Right atrial size was normal in size. Pericardium: There is no evidence of pericardial effusion. Mitral Valve: The mitral valve is normal in structure. There is mild calcification of the mitral valve leaflet(s). Moderate mitral annular calcification. Mild mitral valve regurgitation. No evidence of mitral valve stenosis. MV peak gradient, 9.5 mmHg. The mean mitral valve gradient is 5.0 mmHg. Tricuspid Valve: The tricuspid valve is normal in structure. Tricuspid valve regurgitation is not demonstrated. No evidence of tricuspid stenosis. Aortic Valve: The aortic valve is tricuspid. There is mild calcification of the aortic valve. Aortic valve regurgitation is mild. Aortic regurgitation PHT measures 550 msec. Mild aortic stenosis is present. Aortic valve mean gradient measures 12.0 mmHg. Aortic valve peak gradient measures 22.3 mmHg. Aortic valve area, by VTI measures 2.32 cm. Pulmonic Valve: The pulmonic valve was grossly normal. Pulmonic valve regurgitation is not visualized. No evidence of pulmonic stenosis. Aorta: The aortic root is normal in size and structure. Venous: The inferior vena cava is dilated in size with greater than 50% respiratory variability, suggesting right atrial pressure of 8 mmHg. IAS/Shunts: The interatrial septum appears to be lipomatous. No atrial level shunt detected by color flow Doppler.  LEFT VENTRICLE PLAX 2D LVIDd:         6.10 cm      Diastology LVIDs:         4.10 cm      LV e' medial:    5.00 cm/s LV PW:         1.30 cm      LV E/e' medial:  27.4 LV IVS:        1.30 cm      LV e' lateral:   5.22 cm/s LVOT diam:     2.30 cm      LV E/e' lateral: 26.2 LV SV:         102 LV SV Index:   51 LVOT Area:     4.15 cm  LV Volumes (MOD) LV vol d, MOD A2C: 120.0 ml LV vol d, MOD A4C: 77.5 ml LV vol s, MOD A2C: 58.3 ml LV vol s, MOD A4C: 34.0 ml LV SV MOD A2C:     61.7 ml LV SV MOD A4C:     77.5 ml LV SV MOD BP:      51.0 ml RIGHT VENTRICLE              IVC RV S prime:     14.90 cm/s  IVC diam: 2.50 cm TAPSE (M-mode): 2.4 cm LEFT ATRIUM              Index        RIGHT ATRIUM           Index LA diam:        4.00 cm  2.02 cm/m   RA Area:     13.30 cm LA Vol (A2C):   80.3 ml  40.52 ml/m  RA Volume:   25.80 ml  13.02 ml/m LA Vol (A4C):   106.0 ml 53.49 ml/m LA Biplane Vol: 98.7 ml  49.80 ml/m  AORTIC VALVE AV Area (Vmax):    2.18 cm AV Area (Vmean):   2.18 cm AV Area (VTI):     2.32 cm AV Vmax:           236.00 cm/s AV Vmean:          163.000 cm/s AV VTI:  0.438 m AV Peak Grad:      22.3 mmHg AV Mean Grad:      12.0 mmHg LVOT Vmax:         124.00 cm/s LVOT Vmean:        85.500 cm/s LVOT VTI:          0.245 m LVOT/AV VTI ratio: 0.56 AI PHT:            550 msec  AORTA Ao Root diam: 3.40 cm Ao Asc diam:  3.80 cm MITRAL VALVE                TRICUSPID VALVE MV Area (PHT): 3.99 cm     TR Peak grad:   30.5 mmHg MV Area VTI:   3.05 cm     TR Vmax:        276.00 cm/s MV Peak grad:  9.5 mmHg MV Mean grad:  5.0 mmHg     SHUNTS MV Vmax:       1.54 m/s     Systemic VTI:  0.24 m MV Vmean:      105.0 cm/s   Systemic Diam: 2.30 cm MV Decel Time: 190 msec MV E velocity: 137.00 cm/s MV A velocity: 113.00 cm/s MV E/A ratio:  1.21 Glori Bickers MD Electronically signed by Glori Bickers MD Signature Date/Time: 11/06/2020/4:39:44 PM    Final    Results for orders placed or performed during the hospital encounter of 11/05/20  Resp Panel by RT-PCR (Flu A&B, Covid) Nasopharyngeal Swab     Status: None   Collection Time: 11/05/20 11:20 AM   Specimen: Nasopharyngeal Swab; Nasopharyngeal(NP) swabs in vial transport medium  Result Value Ref Range Status   SARS Coronavirus 2 by RT PCR NEGATIVE NEGATIVE Final    Comment: (NOTE) SARS-CoV-2 target nucleic acids are NOT DETECTED.  The SARS-CoV-2 RNA is generally detectable in upper respiratory specimens during the acute phase of infection. The lowest concentration of SARS-CoV-2 viral copies this assay can  detect is 138 copies/mL. A negative result does not preclude SARS-Cov-2 infection and should not be used as the sole basis for treatment or other patient management decisions. A negative result may occur with  improper specimen collection/handling, submission of specimen other than nasopharyngeal swab, presence of viral mutation(s) within the areas targeted by this assay, and inadequate number of viral copies(<138 copies/mL). A negative result must be combined with clinical observations, patient history, and epidemiological information. The expected result is Negative.  Fact Sheet for Patients:  EntrepreneurPulse.com.au  Fact Sheet for Healthcare Providers:  IncredibleEmployment.be  This test is no t yet approved or cleared by the [place] FDA and  has been authorized for detection and/or diagnosis of SARS-CoV-2 by FDA under an Emergency Use Authorization (EUA). This EUA will remain  in effect (meaning this test can be used) for the duration of the COVID-19 declaration under Section 564(b)(1) of the Act, 21 U.S.C.section 360bbb-3(b)(1), unless the authorization is terminated  or revoked sooner.       Influenza A by PCR NEGATIVE NEGATIVE Final   Influenza B by PCR NEGATIVE NEGATIVE Final    Comment: (NOTE) The Xpert Xpress SARS-CoV-2/FLU/RSV plus assay is intended as an aid in the diagnosis of influenza from Nasopharyngeal swab specimens and should not be used as a sole basis for treatment. Nasal washings and aspirates are unacceptable for Xpert Xpress SARS-CoV-2/FLU/RSV testing.  Fact Sheet for Patients: EntrepreneurPulse.com.au  Fact Sheet for Healthcare Providers: IncredibleEmployment.be  This test is not yet approved or  cleared by the [place] and has been authorized for detection and/or diagnosis of SARS-CoV-2 by FDA under an Emergency Use Authorization (EUA). This EUA will remain in  effect (meaning this test can be used) for the duration of the COVID-19 declaration under Section 564(b)(1) of the Act, 21 U.S.C. section 360bbb-3(b)(1), unless the authorization is terminated or revoked.  Performed at [facility], [address] [place], [place] [address]   Blood culture (routine x 2)     Status: None   Collection Time: 11/05/20  2:09 PM   Specimen: BLOOD RIGHT ARM  Result Value Ref Range Status   Specimen Description   Final    BLOOD RIGHT ARM Performed at [facility], [address] [place], [place] [address]    Special Requests   Final    BOTTLES DRAWN AEROBIC AND ANAEROBIC Blood Culture adequate volume Performed at [facility], [address] [place], [place] [address]    Culture   Final    NO GROWTH 5 DAYS Performed at [facility], [place] [address], [place], [place] [address]    Report Status 11/10/2020 FINAL  Final  Blood culture (routine x 2)     Status: None   Collection Time: 11/05/20  2:09 PM   Specimen: Left Antecubital; Blood  Result Value Ref Range Status   Specimen Description   Final    LEFT ANTECUBITAL Performed at [facility], [address] [place], [place] [address]    Special Requests   Final    BOTTLES DRAWN AEROBIC AND ANAEROBIC Blood Culture adequate volume Performed at [facility], [address] [place], [place] [address]    Culture   Final    NO GROWTH 5 DAYS Performed at [facility], [place] [address] [place], [place] [address]    Report Status 11/10/2020 FINAL  Final  Gastrointestinal Panel by PCR , Stool     Status: None   Collection Time: 11/06/20  2:46 PM   Specimen: STOOL  Result Value Ref Range Status   Campylobacter species NOT DETECTED NOT DETECTED Final   Plesimonas shigelloides NOT DETECTED NOT DETECTED Final   Salmonella species NOT DETECTED NOT DETECTED Final   Yersinia enterocolitica NOT DETECTED NOT DETECTED Final   Vibrio species NOT  DETECTED NOT DETECTED Final   Vibrio cholerae NOT DETECTED NOT DETECTED Final   Enteroaggregative E coli (EAEC) NOT DETECTED NOT DETECTED Final   Enteropathogenic E coli (EPEC) NOT DETECTED NOT DETECTED Final   Enterotoxigenic E coli (ETEC) NOT DETECTED NOT DETECTED Final   Shiga like toxin producing E coli (STEC) NOT DETECTED NOT DETECTED Final   Shigella/Enteroinvasive E coli (EIEC) NOT DETECTED NOT DETECTED Final   Cryptosporidium NOT DETECTED NOT DETECTED Final   Cyclospora cayetanensis NOT DETECTED NOT DETECTED Final   Entamoeba histolytica NOT DETECTED NOT DETECTED Final   Giardia lamblia NOT DETECTED NOT DETECTED Final   Adenovirus F40/41 NOT DETECTED NOT DETECTED Final   Astrovirus NOT DETECTED NOT DETECTED Final   Norovirus GI/GII NOT DETECTED NOT DETECTED Final   Rotavirus A NOT DETECTED NOT DETECTED Final   Sapovirus (I, II, IV, and V) NOT DETECTED NOT DETECTED Final    Comment: Performed at [facility], [address], [place], [place] [address]  C Difficile Quick Screen w PCR reflex     Status: None   Collection Time: 11/06/20  2:46 PM  Specimen: STOOL  Result Value Ref Range Status   C Diff antigen NEGATIVE NEGATIVE Final   C Diff toxin NEGATIVE NEGATIVE Final   C Diff interpretation No C. difficile detected.  Final    Comment: Performed at [facility], [place] [address], [place], [place] [address]    Signed:  Dwyane Dee MD.  [facility] 11/12/2020, 4:52 PM

## 2020-11-12 NOTE — Progress Notes (Signed)
SATURATION QUALIFICATIONS: (This note is used to comply with regulatory documentation for home oxygen)  Patient Saturations on Room Air at Rest = 93%  Patient Saturations on Room Air while Ambulating = 86%  Patient Saturations on 2 Liters of oxygen while Ambulating = 93%  Please briefly explain why patient needs home oxygen: 

## 2020-11-12 NOTE — Care Management Important Message (Signed)
Important Message  Patient Details IM Letter placed in Patients room. Name: Rebecca Elliott MRN: [id number] Date of Birth: 09-17-1926   Medicare Important Message Given:  Yes     Kerin Salen 11/12/2020, 9:56 AM

## 2020-11-13 ENCOUNTER — Telehealth: Payer: Self-pay

## 2020-11-13 NOTE — Telephone Encounter (Signed)
Transition Care Management Follow-up Telephone Call Date of discharge and from where: 11/12/2020-[facility] How have you been since you were released from the hospital? Per daughter, doing pretty well. She has not had to use the oxygen at all since she has been home. Any questions or concerns? No  Items Reviewed: Did the pt receive and understand the discharge instructions provided? Yes  Medications obtained and verified? No needs refill of Amlodipine. Message sent to PCP. Other? Yes  Any new allergies since your discharge? No  Dietary orders reviewed? Yes Do you have support at home? Yes   Home Care and Equipment/Supplies: Were home health services ordered? yes If so, what is the name of the agency? [facility]  Has the agency set up a time to come to the patient's home? yes Were any new equipment or medical supplies ordered?  Yes: Oxygen & Bedside [employer] What is the name of the medical supply agency? [facility] Were you able to get the supplies/equipment? yes Do you have any questions related to the use of the equipment or supplies? No  Functional Questionnaire: (I = Independent and D = Dependent) ADLs: I WITH ASSISTANCE  Bathing/Dressing- I  Meal Prep- D  Eating- I  Maintaining continence- I  Transferring/Ambulation- I WITH ROLLATOR WALKER  Managing Meds- D  Follow up appointments reviewed:  PCP Hospital f/u appt confirmed? Yes  Scheduled to see Dr. Larose Kells on 11/25/2020 @ 10:20. [facility] f/u appt confirmed?  N/a   Are transportation arrangements needed? No  If their condition worsens, is the pt aware to call PCP or go to the Emergency Dept.? Yes Was the patient provided with contact information for the PCP's office or ED? Yes Was to pt encouraged to call back with questions or concerns? Yes

## 2020-11-13 NOTE — Telephone Encounter (Signed)
Patient's daughter is requesting a refill of Amlodipine for patient.

## 2020-11-14 NOTE — Telephone Encounter (Signed)
According to my last note and discharge summary, she is not on amlodipine. Recommend not to take amlodipine, monitor BPs, call if BP is elevated, we can discuss further on her TCM follow-up

## 2020-11-15 DIAGNOSIS — D631 Anemia in chronic kidney disease: Secondary | ICD-10-CM | POA: Diagnosis not present

## 2020-11-15 DIAGNOSIS — E1140 Type 2 diabetes mellitus with diabetic neuropathy, unspecified: Secondary | ICD-10-CM | POA: Diagnosis not present

## 2020-11-15 DIAGNOSIS — J188 Other pneumonia, unspecified organism: Secondary | ICD-10-CM | POA: Diagnosis not present

## 2020-11-15 DIAGNOSIS — J440 Chronic obstructive pulmonary disease with acute lower respiratory infection: Secondary | ICD-10-CM | POA: Diagnosis not present

## 2020-11-15 DIAGNOSIS — Z7984 Long term (current) use of oral hypoglycemic drugs: Secondary | ICD-10-CM | POA: Diagnosis not present

## 2020-11-15 DIAGNOSIS — N1832 Chronic kidney disease, stage 3b: Secondary | ICD-10-CM | POA: Diagnosis not present

## 2020-11-15 DIAGNOSIS — E785 Hyperlipidemia, unspecified: Secondary | ICD-10-CM | POA: Diagnosis not present

## 2020-11-15 DIAGNOSIS — E876 Hypokalemia: Secondary | ICD-10-CM | POA: Diagnosis not present

## 2020-11-15 DIAGNOSIS — Z9181 History of falling: Secondary | ICD-10-CM | POA: Diagnosis not present

## 2020-11-15 DIAGNOSIS — Z7982 Long term (current) use of aspirin: Secondary | ICD-10-CM | POA: Diagnosis not present

## 2020-11-15 DIAGNOSIS — K219 Gastro-esophageal reflux disease without esophagitis: Secondary | ICD-10-CM | POA: Diagnosis not present

## 2020-11-15 DIAGNOSIS — Z6831 Body mass index (BMI) 31.0-31.9, adult: Secondary | ICD-10-CM | POA: Diagnosis not present

## 2020-11-15 DIAGNOSIS — I5033 Acute on chronic diastolic (congestive) heart failure: Secondary | ICD-10-CM | POA: Diagnosis not present

## 2020-11-15 DIAGNOSIS — J441 Chronic obstructive pulmonary disease with (acute) exacerbation: Secondary | ICD-10-CM | POA: Diagnosis not present

## 2020-11-15 DIAGNOSIS — I130 Hypertensive heart and chronic kidney disease with heart failure and stage 1 through stage 4 chronic kidney disease, or unspecified chronic kidney disease: Secondary | ICD-10-CM | POA: Diagnosis not present

## 2020-11-15 DIAGNOSIS — J9601 Acute respiratory failure with hypoxia: Secondary | ICD-10-CM | POA: Diagnosis not present

## 2020-11-15 DIAGNOSIS — E669 Obesity, unspecified: Secondary | ICD-10-CM | POA: Diagnosis not present

## 2020-11-15 DIAGNOSIS — F32A Depression, unspecified: Secondary | ICD-10-CM | POA: Diagnosis not present

## 2020-11-15 DIAGNOSIS — M1990 Unspecified osteoarthritis, unspecified site: Secondary | ICD-10-CM | POA: Diagnosis not present

## 2020-11-15 DIAGNOSIS — E1122 Type 2 diabetes mellitus with diabetic chronic kidney disease: Secondary | ICD-10-CM | POA: Diagnosis not present

## 2020-11-15 DIAGNOSIS — Z86010 Personal history of colonic polyps: Secondary | ICD-10-CM | POA: Diagnosis not present

## 2020-11-15 DIAGNOSIS — I2510 Atherosclerotic heart disease of native coronary artery without angina pectoris: Secondary | ICD-10-CM | POA: Diagnosis not present

## 2020-11-17 NOTE — Telephone Encounter (Signed)
Patient notified

## 2020-11-18 DIAGNOSIS — N1832 Chronic kidney disease, stage 3b: Secondary | ICD-10-CM | POA: Diagnosis not present

## 2020-11-18 DIAGNOSIS — I130 Hypertensive heart and chronic kidney disease with heart failure and stage 1 through stage 4 chronic kidney disease, or unspecified chronic kidney disease: Secondary | ICD-10-CM | POA: Diagnosis not present

## 2020-11-18 DIAGNOSIS — I5033 Acute on chronic diastolic (congestive) heart failure: Secondary | ICD-10-CM | POA: Diagnosis not present

## 2020-11-18 DIAGNOSIS — J188 Other pneumonia, unspecified organism: Secondary | ICD-10-CM | POA: Diagnosis not present

## 2020-11-18 DIAGNOSIS — D631 Anemia in chronic kidney disease: Secondary | ICD-10-CM | POA: Diagnosis not present

## 2020-11-18 DIAGNOSIS — E1122 Type 2 diabetes mellitus with diabetic chronic kidney disease: Secondary | ICD-10-CM | POA: Diagnosis not present

## 2020-11-20 ENCOUNTER — Encounter (INDEPENDENT_AMBULATORY_CARE_PROVIDER_SITE_OTHER): Payer: Medicare Other | Admitting: Ophthalmology

## 2020-11-20 DIAGNOSIS — E1122 Type 2 diabetes mellitus with diabetic chronic kidney disease: Secondary | ICD-10-CM | POA: Diagnosis not present

## 2020-11-20 DIAGNOSIS — I5033 Acute on chronic diastolic (congestive) heart failure: Secondary | ICD-10-CM | POA: Diagnosis not present

## 2020-11-20 DIAGNOSIS — J188 Other pneumonia, unspecified organism: Secondary | ICD-10-CM | POA: Diagnosis not present

## 2020-11-20 DIAGNOSIS — D631 Anemia in chronic kidney disease: Secondary | ICD-10-CM | POA: Diagnosis not present

## 2020-11-20 DIAGNOSIS — N1832 Chronic kidney disease, stage 3b: Secondary | ICD-10-CM | POA: Diagnosis not present

## 2020-11-20 DIAGNOSIS — I130 Hypertensive heart and chronic kidney disease with heart failure and stage 1 through stage 4 chronic kidney disease, or unspecified chronic kidney disease: Secondary | ICD-10-CM | POA: Diagnosis not present

## 2020-11-25 ENCOUNTER — Ambulatory Visit (INDEPENDENT_AMBULATORY_CARE_PROVIDER_SITE_OTHER): Payer: Medicare Other | Admitting: Ophthalmology

## 2020-11-25 ENCOUNTER — Other Ambulatory Visit: Payer: Self-pay

## 2020-11-25 ENCOUNTER — Encounter (INDEPENDENT_AMBULATORY_CARE_PROVIDER_SITE_OTHER): Payer: Self-pay | Admitting: Ophthalmology

## 2020-11-25 ENCOUNTER — Inpatient Hospital Stay: Payer: Medicare Other | Admitting: Internal Medicine

## 2020-11-25 DIAGNOSIS — H34832 Tributary (branch) retinal vein occlusion, left eye, with macular edema: Secondary | ICD-10-CM

## 2020-11-25 DIAGNOSIS — H35371 Puckering of macula, right eye: Secondary | ICD-10-CM | POA: Diagnosis not present

## 2020-11-25 DIAGNOSIS — H353131 Nonexudative age-related macular degeneration, bilateral, early dry stage: Secondary | ICD-10-CM | POA: Diagnosis not present

## 2020-11-25 MED ORDER — BEVACIZUMAB 2.5 MG/0.1ML IZ SOSY
2.5000 mg | PREFILLED_SYRINGE | INTRAVITREAL | Status: AC | PRN
  Administered 2020-11-25: 2.5 mg via INTRAVITREAL

## 2020-11-25 NOTE — Assessment & Plan Note (Signed)
Follow-up today at 10-week interval after patient rescheduled from attended 5-week interval.  CME has returned significantly.  Repeat injection today and return to 5-week evaluation

## 2020-11-25 NOTE — Progress Notes (Signed)
11/25/2020     CHIEF COMPLAINT Patient presents for  Chief Complaint  Patient presents with   Retina Follow Up    6 week fu OS and Avastin OS Pt states, "I am pretty sure that my vision is worse than it was. It seems to be more blurred."       HISTORY OF PRESENT ILLNESS: Rebecca Elliott is a 87 y.o. female who presents to the clinic today for:   HPI     Retina Follow Up   Patient presents with  CRVO/BRVO.  In left eye.  This started 10 weeks ago.  Severity is mild.  Duration of 10 weeks.  Since onset it is gradually worsening. Additional comments: 6 week fu OS and Avastin OS Pt states, "I am pretty sure that my vision is worse than it was. It seems to be more blurred."         Comments   10 week fu OS oct avastin OS (rs from 10-16-20). Pt was in the hospital for pneumonia for 1 week, October 19-26th, per patient. Pt states "I don't think my vision has changed." Patient states vision is stable and unchanged since last visit. Denies any new floaters or FOL.       Last edited by Laurin Coder on 11/25/2020  1:28 PM.      Referring physician: Colon Branch, MD [address] [place],  [place] [address]  HISTORICAL INFORMATION:   Selected notes from the MEDICAL RECORD NUMBER    Lab Results  Component Value Date   HGBA1C 6.5 06/17/2020     CURRENT MEDICATIONS: No current outpatient medications on file. (Ophthalmic Drugs)   No current facility-administered medications for this visit. (Ophthalmic Drugs)   Current Outpatient Medications (Other)  Medication Sig   acetaminophen (TYLENOL) 500 MG tablet Take 500 mg by mouth every 6 (six) hours as needed for mild pain, headache or fever.   albuterol (VENTOLIN HFA) 108 (90 Base) MCG/ACT inhaler Inhale 2 puffs into the lungs every 6 (six) hours as needed for wheezing or shortness of breath.   amLODipine (NORVASC) 10 MG tablet Take 10 mg by mouth daily.   arformoterol (BROVANA) 15 MCG/2ML NEBU Take 2 mLs (15  mcg total) by nebulization in the morning and at bedtime.   atorvastatin (LIPITOR) 10 MG tablet Take 1 tablet (10 mg total) by mouth daily.   budesonide (PULMICORT) 0.25 MG/2ML nebulizer solution TAKE 2 ML VIA NEBULIZATION IN THE MORNING AND AT BEDTIME (Patient taking differently: Take 0.25 mg by nebulization in the morning and at bedtime.)   Cyanocobalamin (VITAMIN B-12 PO) Take 1 tablet by mouth daily.   dextromethorphan-guaiFENesin (MUCINEX DM) 30-600 MG per 12 hr tablet Take 1 tablet by mouth daily.   diclofenac sodium (VOLTAREN) 1 % GEL Apply 2 g topically 4 (four) times daily.   docusate sodium (COLACE) 100 MG capsule Take 100-200 mg by mouth daily as needed for mild constipation.   famotidine (PEPCID) 20 MG tablet Take 1 tablet (20 mg total) by mouth 2 (two) times daily.   furosemide (LASIX) 40 MG tablet Take 1 tablet (40 mg total) by mouth daily.   glimepiride (AMARYL) 2 MG tablet Take 1 tablet (2 mg total) by mouth daily with breakfast.   hydrALAZINE (APRESOLINE) 25 MG tablet Take 1 tablet (25 mg total) by mouth 3 (three) times daily.   ipratropium-albuterol (DUONEB) 0.5-2.5 (3) MG/3ML SOLN Take 3 mLs by nebulization every 6 (six) hours as  needed. (Patient taking differently: Take 3 mLs by nebulization every 6 (six) hours as needed (wheezing/shortness of breath).)   loratadine (CLARITIN) 10 MG tablet Take 10 mg by mouth daily.   metoprolol succinate (TOPROL XL) 100 MG 24 hr tablet Take 1 tablet (100 mg total) by mouth daily. Take with or immediately following a meal.   Multiple Vitamins-Minerals (CENTRUM SILVER) CHEW Chew 1 tablet by mouth daily.   NONFORMULARY OR COMPOUNDED ITEM Compression Stockings Dx: M79.89   pantoprazole (PROTONIX) 40 MG tablet Take 1 tablet (40 mg total) by mouth every other day.   sitaGLIPtin (JANUVIA) 50 MG tablet Take 1 tablet (50 mg total) by mouth daily.   valsartan (DIOVAN) 160 MG tablet TAKE 1 TABLET BY MOUTH EVERY DAY (Patient taking differently: Take 160  mg by mouth daily.)   No current facility-administered medications for this visit. (Other)      REVIEW OF SYSTEMS:    ALLERGIES No Known Allergies  PAST MEDICAL HISTORY Past Medical History:  Diagnosis Date   Allergic rhinitis    COPD  and ASTHMA    Depression    Diabetes mellitus    as an adult   DJD (degenerative joint disease)    GERD (gastroesophageal reflux disease)    and HH w/ reflux   History of colonic polyps    Hyperlipidemia    Hypertension    Past Surgical History:  Procedure Laterality Date   ABDOMINAL HYSTERECTOMY     apparently no oophorectomy   APPENDECTOMY     bilateral breast tumor     BREAST EXCISIONAL BIOPSY Bilateral    one in her 20's and one in her 11's, both benign   CATARACT EXTRACTION Bilateral    Groat   CATARACT EXTRACTION, BILATERAL     CHOLECYSTECTOMY     TONSILLECTOMY     TUBAL LIGATION      FAMILY HISTORY Family History  Problem Relation Age of Onset   Breast cancer Sister        x 2   Diabetes Father    Diabetes Sister    Heart attack Brother        early 56s   Heart attack Son    Pancreatic cancer Son    Stomach cancer Sister    Colon cancer Neg Hx     SOCIAL HISTORY Social History   Tobacco Use   Smoking status: Former    Packs/day: 1.00    Years: 35.00    Pack years: 35.00    Types: Cigarettes    Quit date: 01/18/1993    Years since quitting: 27.8   Smokeless tobacco: Never  Substance Use Topics   Alcohol use: No    Alcohol/week: 0.0 standard drinks   Drug use: No         OPHTHALMIC EXAM:  Base Eye Exam     Visual Acuity (ETDRS)       Right Left   Dist cc 20/40 -1 20/100   Dist ph cc NI 20/80    Correction: Glasses         Tonometry (Tonopen, 1:32 PM)       Right Left   Pressure 5 4  Squinting, blinking a lot        Pupils       Pupils Dark Light Shape React APD   Right PERRL 4 4 Round Minimal None   Left PERRL 4 4 Round Minimal None         Extraocular Movement  Right Left    Full Full         Neuro/Psych     Oriented x3: Yes   Mood/Affect: Normal         Dilation     Both eyes: 1.0% Mydriacyl, 2.5% Phenylephrine @ 1:32 PM           Slit Lamp and Fundus Exam     External Exam       Right Left   External Normal Normal         Slit Lamp Exam       Right Left   Lids/Lashes Normal Normal   Conjunctiva/Sclera White and quiet White and quiet   Cornea Clear Clear   Anterior Chamber Deep and quiet Deep and quiet   Iris Round and reactive Round and reactive   Lens Posterior chamber intraocular lens Posterior chamber intraocular lens, Centered posterior chamber intraocular lens   Anterior Vitreous Normal Normal         Fundus Exam       Right Left   Posterior Vitreous Normal Posterior vitreous detachment   Disc Normal Normal   C/D Ratio 0.1 0.1   Macula Epiretinal membrane minor Hard drusen, Intraretinal hemorrhage, Macular thickening, Retinal pigment epithelial mottling, Early age related macular degeneration, Microaneurysm large, no cystoid macular edema   Vessels Normal AV nicking, Tortuous, and 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 2:27 PM. Confirmed correct patient, procedure, site, and patient consented.   Anesthesia Topical anesthesia was used. Anesthetic medications included Lidocaine 4%.   Procedure Preparation included Tobramycin 0.3%, 10% betadine to eyelids, 5% betadine to ocular surface. A 30 gauge needle was used.   Injection: 2.5 mg bevacizumab 2.5 MG/0.1ML   Route: Intravitreal, Site: Left Eye   NDC: [phone], Lot: [id number]   Post-op Post injection exam found visual acuity of at least  counting fingers. The patient tolerated the procedure well. There were no complications. The patient received written and verbal post procedure care education. Post injection medications included ocuflox.      OCT, Retina - OU - Both Eyes       Right Eye Quality was good. Scan locations included subfoveal. Central Foveal Thickness: 268. Progression has been stable. Findings include abnormal foveal contour, retinal drusen , no IRF, no SRF.   Left Eye Quality was good. Scan locations included subfoveal. Central Foveal Thickness: 551. Progression has improved. Findings include no SRF, retinal drusen , cystoid macular edema.   Notes At 5-week interval.  Massive CME has improved from BRVO originally with stability at 5-week interval, now on 10-week interval.  repeat injection today and examination next in 5 weeks             ASSESSMENT/PLAN:  Macular pucker, right eye OD Minor by OCT, no interval change observe  Early stage nonexudative age-related macular degeneration of both eyes No sign of CNVM by OCT  Branch retinal vein occlusion with macular edema of left eye Follow-up today at 10-week interval after patient rescheduled from attended 5-week interval.  CME has returned significantly.  Repeat injection today and return to 5-week evaluation      ICD-10-CM  1. Branch retinal vein occlusion with macular edema of left eye  H34.8320 Intravitreal Injection, Pharmacologic Agent - OS - Left Eye    OCT, Retina - OU - Both Eyes    bevacizumab (AVASTIN) SOSY 2.5 mg    2. Macular pucker, right eye  H35.371     3. Early stage nonexudative age-related macular degeneration of both eyes  H35.3131       1.  OS with CME recurrence from BRVO.  We will repeat injection Avastin today and repeat examination left eye in 5 weeks  2.  Dilate OU next with color fundus photography and FFA left/right OCT possible Avastin  3.  Ophthalmic Meds Ordered this visit:  Meds ordered this encounter   Medications   bevacizumab (AVASTIN) SOSY 2.5 mg       Return in about 5 weeks (around 12/30/2020) for DILATE OU, COLOR FP, OPTOS FFA L/R, AVASTIN OCT, OS.  There are no Patient Instructions on file for this visit.   Explained the diagnoses, plan, and follow up with the patient and they expressed understanding.  Patient expressed understanding of the importance of proper follow up care.   Clent Demark Temple Ewart M.D. Diseases & Surgery of the Retina and Vitreous [facility] [place] 11/25/20     Abbreviations: M myopia (nearsighted); A astigmatism; H hyperopia (farsighted); P presbyopia; Mrx spectacle prescription;  CTL contact lenses; OD right eye; OS left eye; OU both eyes  XT exotropia; ET esotropia; PEK punctate epithelial keratitis; PEE punctate epithelial erosions; DES dry eye syndrome; MGD meibomian gland dysfunction; ATs artificial tears; PFAT's preservative free artificial tears; [place] nuclear sclerotic cataract; PSC posterior subcapsular cataract; ERM epi-retinal membrane; PVD posterior vitreous detachment; RD retinal detachment; DM diabetes mellitus; DR diabetic retinopathy; NPDR non-proliferative diabetic retinopathy; PDR proliferative diabetic retinopathy; CSME clinically significant macular edema; DME diabetic macular edema; dbh dot blot hemorrhages; CWS cotton wool spot; POAG primary open angle glaucoma; C/D cup-to-disc ratio; HVF humphrey visual field; GVF goldmann visual field; OCT optical coherence tomography; IOP intraocular pressure; BRVO Branch retinal vein occlusion; CRVO central retinal vein occlusion; CRAO central retinal artery occlusion; BRAO branch retinal artery occlusion; RT retinal tear; SB scleral buckle; PPV pars plana vitrectomy; VH Vitreous hemorrhage; PRP panretinal laser photocoagulation; IVK intravitreal kenalog; VMT vitreomacular traction; MH Macular hole;  NVD neovascularization of the disc; NVE neovascularization elsewhere; AREDS age related eye disease  study; ARMD age related macular degeneration; POAG primary open angle glaucoma; EBMD epithelial/anterior basement membrane dystrophy; ACIOL anterior chamber intraocular lens; IOL intraocular lens; PCIOL posterior chamber intraocular lens; Phaco/IOL phacoemulsification with intraocular lens placement; [place] photorefractive keratectomy; LASIK laser assisted in situ keratomileusis; HTN hypertension; DM diabetes mellitus; COPD chronic obstructive pulmonary disease

## 2020-11-25 NOTE — Assessment & Plan Note (Signed)
No sign of CNVM by OCT 

## 2020-11-25 NOTE — Assessment & Plan Note (Signed)
OD Minor by OCT, no interval change observe

## 2020-11-27 DIAGNOSIS — I5033 Acute on chronic diastolic (congestive) heart failure: Secondary | ICD-10-CM | POA: Diagnosis not present

## 2020-11-27 DIAGNOSIS — D631 Anemia in chronic kidney disease: Secondary | ICD-10-CM | POA: Diagnosis not present

## 2020-11-27 DIAGNOSIS — N1832 Chronic kidney disease, stage 3b: Secondary | ICD-10-CM | POA: Diagnosis not present

## 2020-11-27 DIAGNOSIS — J188 Other pneumonia, unspecified organism: Secondary | ICD-10-CM | POA: Diagnosis not present

## 2020-11-27 DIAGNOSIS — E1122 Type 2 diabetes mellitus with diabetic chronic kidney disease: Secondary | ICD-10-CM | POA: Diagnosis not present

## 2020-11-27 DIAGNOSIS — I130 Hypertensive heart and chronic kidney disease with heart failure and stage 1 through stage 4 chronic kidney disease, or unspecified chronic kidney disease: Secondary | ICD-10-CM | POA: Diagnosis not present

## 2020-11-28 ENCOUNTER — Ambulatory Visit (INDEPENDENT_AMBULATORY_CARE_PROVIDER_SITE_OTHER): Payer: Medicare Other | Admitting: Internal Medicine

## 2020-11-28 ENCOUNTER — Encounter: Payer: Self-pay | Admitting: Internal Medicine

## 2020-11-28 ENCOUNTER — Other Ambulatory Visit: Payer: Self-pay

## 2020-11-28 VITALS — BP 142/70 | HR 72 | Temp 98.0°F | Resp 18 | Ht 66.0 in | Wt 190.5 lb

## 2020-11-28 DIAGNOSIS — I10 Essential (primary) hypertension: Secondary | ICD-10-CM | POA: Diagnosis not present

## 2020-11-28 DIAGNOSIS — Z23 Encounter for immunization: Secondary | ICD-10-CM

## 2020-11-28 DIAGNOSIS — I5032 Chronic diastolic (congestive) heart failure: Secondary | ICD-10-CM

## 2020-11-28 DIAGNOSIS — I5033 Acute on chronic diastolic (congestive) heart failure: Secondary | ICD-10-CM

## 2020-11-28 DIAGNOSIS — J9601 Acute respiratory failure with hypoxia: Secondary | ICD-10-CM

## 2020-11-28 LAB — BASIC METABOLIC PANEL
BUN: 27 mg/dL — ABNORMAL HIGH (ref 6–23)
CO2: 31 mEq/L (ref 19–32)
Calcium: 8.7 mg/dL (ref 8.4–10.5)
Chloride: 105 mEq/L (ref 96–112)
Creatinine, Ser: 1.84 mg/dL — ABNORMAL HIGH (ref 0.40–1.20)
GFR: 23.48 mL/min — ABNORMAL LOW (ref >60.00)
Glucose, Bld: 86 mg/dL (ref 70–99)
Potassium: 4 mEq/L (ref 3.5–5.1)
Sodium: 143 mEq/L (ref 135–145)

## 2020-11-28 LAB — CBC WITH DIFFERENTIAL/PLATELET
Basophils Absolute: 0.1 10*3/uL (ref 0.0–0.1)
Basophils Relative: 1.1 % (ref 0.0–3.0)
Eosinophils Absolute: 0.1 10*3/uL (ref 0.0–0.7)
Eosinophils Relative: 1.8 % (ref 0.0–5.0)
HCT: 34.1 % — ABNORMAL LOW (ref 36.0–46.0)
Hemoglobin: 10.7 g/dL — ABNORMAL LOW (ref 12.0–15.0)
Lymphocytes Relative: 43.5 % (ref 12.0–46.0)
Lymphs Abs: 3.5 10*3/uL (ref 0.7–4.0)
MCHC: 31.5 g/dL (ref 30.0–36.0)
MCV: 82.2 fl (ref 78.0–100.0)
Monocytes Absolute: 0.7 10*3/uL (ref 0.1–1.0)
Monocytes Relative: 8.2 % (ref 3.0–12.0)
Neutro Abs: 3.6 10*3/uL (ref 1.4–7.7)
Neutrophils Relative %: 45.4 % (ref 43.0–77.0)
Platelets: 177 10*3/uL (ref 150.0–400.0)
RBC: 4.15 Mil/uL (ref 3.87–5.11)
RDW: 16.9 % — ABNORMAL HIGH (ref 11.5–15.5)
WBC: 8 10*3/uL (ref 4.0–10.5)

## 2020-11-28 LAB — BRAIN NATRIURETIC PEPTIDE: Pro B Natriuretic peptide (BNP): 788 pg/mL — ABNORMAL HIGH (ref 0.0–100.0)

## 2020-11-28 NOTE — Patient Instructions (Signed)
Continue checking your weight and blood pressures  Use the oxygen if your saturation is 92% and you feel short of breath  GO TO THE LAB : Get the blood work     GO TO THE FRONT DESK, [place] back for a checkup in 4 weeks

## 2020-11-28 NOTE — Progress Notes (Signed)
Subjective:    Patient ID: Rebecca Elliott, female    DOB: 07-25-1926, 87 y.o.   MRN: [id number]  DOS:  11/28/2020 Type of visit - description: here w/ her daughter, hospital follow-up  Admitted to the hospital and discharged 11/12/2020. Presented with difficulty breathing and lower extremity swelling. Initial chest x-ray showed possible bilateral lower lobe pneumonia with underlying pleural effusions. She was also found to be volume overloaded. Was treated with antibiotics, steroids and Lasix. Subsequently she improved significantly. She was discharged home on O2 due to hypoxia on exertion.  On chart review: CT abdomen upon admission: Diverticulosis, right pleural effusion, B lower lobe ATX versus infiltrate BNP upon admission was 949: Subsequently was 1700 Echocardiogram:  1. Left ventricular ejection fraction, by estimation, is 55 to 60%. The left ventricle has normal function. The left ventricle has no regional wall motion abnormalities. There is mild left ventricular hypertrophy.  Left ventricular diastolic parameters are consistent with Grade II diastolic dysfunction (pseudonormalization).     Review of Systems Since she left the hospital she is feeling better. No fever chills Edema has not returned No cough. We reviewed her ambulatory vital signs.  Past Medical History:  Diagnosis Date   Allergic rhinitis    COPD  and ASTHMA    Depression    Diabetes mellitus    as an adult   DJD (degenerative joint disease)    GERD (gastroesophageal reflux disease)    and HH w/ reflux   History of colonic polyps    Hyperlipidemia    Hypertension     Past Surgical History:  Procedure Laterality Date   ABDOMINAL HYSTERECTOMY     apparently no oophorectomy   APPENDECTOMY     bilateral breast tumor     BREAST EXCISIONAL BIOPSY Bilateral    one in her 20's and one in her 52's, both benign   CATARACT EXTRACTION Bilateral    Groat   CATARACT EXTRACTION, BILATERAL      CHOLECYSTECTOMY     TONSILLECTOMY     TUBAL LIGATION      Allergies as of 11/28/2020   No Known Allergies      Medication List        Accurate as of November 28, 2020 11:59 PM. If you have any questions, ask your nurse or doctor.          STOP taking these medications    amLODipine 10 MG tablet Commonly known as: NORVASC Stopped by: Kathlene November, MD       TAKE these medications    acetaminophen 500 MG tablet Commonly known as: TYLENOL Take 500 mg by mouth every 6 (six) hours as needed for mild pain, headache or fever.   albuterol 108 (90 Base) MCG/ACT inhaler Commonly known as: VENTOLIN HFA Inhale 2 puffs into the lungs every 6 (six) hours as needed for wheezing or shortness of breath.   arformoterol 15 MCG/2ML Nebu Commonly known as: BROVANA Take 2 mLs (15 mcg total) by nebulization in the morning and at bedtime.   atorvastatin 10 MG tablet Commonly known as: LIPITOR Take 1 tablet (10 mg total) by mouth daily.   budesonide 0.25 MG/2ML nebulizer solution Commonly known as: PULMICORT TAKE 2 ML VIA NEBULIZATION IN THE MORNING AND AT BEDTIME What changed: See the new instructions.   Centrum Silver Chew Chew 1 tablet by mouth daily.   dextromethorphan-guaiFENesin 30-600 MG 12hr tablet Commonly known as: MUCINEX DM Take 1 tablet by mouth daily.   diclofenac sodium 1 %  Gel Commonly known as: VOLTAREN Apply 2 g topically 4 (four) times daily.   docusate sodium 100 MG capsule Commonly known as: COLACE Take 100-200 mg by mouth daily as needed for mild constipation.   famotidine 20 MG tablet Commonly known as: PEPCID Take 1 tablet (20 mg total) by mouth 2 (two) times daily.   furosemide 40 MG tablet Commonly known as: Lasix Take 1 tablet (40 mg total) by mouth daily.   glimepiride 2 MG tablet Commonly known as: AMARYL Take 1 tablet (2 mg total) by mouth daily with breakfast.   hydrALAZINE 25 MG tablet Commonly known as: APRESOLINE Take 1 tablet (25  mg total) by mouth 3 (three) times daily.   ipratropium-albuterol 0.5-2.5 (3) MG/3ML Soln Commonly known as: DUONEB Take 3 mLs by nebulization every 6 (six) hours as needed. What changed: reasons to take this   loratadine 10 MG tablet Commonly known as: CLARITIN Take 10 mg by mouth daily.   metoprolol succinate 100 MG 24 hr tablet Commonly known as: Toprol XL Take 1 tablet (100 mg total) by mouth daily. Take with or immediately following a meal.   NONFORMULARY OR COMPOUNDED ITEM Compression Stockings Dx: M79.89   pantoprazole 40 MG tablet Commonly known as: PROTONIX Take 1 tablet (40 mg total) by mouth every other day.   sitaGLIPtin 50 MG tablet Commonly known as: Januvia Take 1 tablet (50 mg total) by mouth daily.   valsartan 160 MG tablet Commonly known as: DIOVAN TAKE 1 TABLET BY MOUTH EVERY DAY   VITAMIN B-12 PO Take 1 tablet by mouth daily.           Objective:   Physical Exam BP (!) 142/70 (BP Location: Left Arm, Patient Position: Sitting, Cuff Size: Small)   Pulse 72   Temp 98 F (36.7 C) (Oral)   Resp 18   Ht 5\' 6"  (1.676 m)   Wt 190 lb 8 oz (86.4 kg)   SpO2 93%   BMI 30.75 kg/m  General:   Well developed, NAD, BMI noted. HEENT:  Normocephalic . Face symmetric, atraumatic Lungs:  Few rhonchi otherwise clear Normal respiratory effort, no intercostal retractions, no accessory muscle use. Heart: RRR,  no murmur.  Lower extremities: no pretibial edema bilaterally  Skin: Not pale. Not jaundice Neurologic:  alert & oriented X3.  Speech normal, gait appropriate for age and unassisted Psych--  Cognition and judgment appear intact.  Cooperative with normal attention span and concentration.  Behavior appropriate. No anxious or depressed appearing.      Assessment    ASSESSMENT DM--no neuropathy, Metformin DC 04-2015 d/t creatinine HTN  Hyperlipidemia Depression COPD - Asthma Pulmonary nodules per CXR 03-2014, CT 03-2016, stable, no further CTs  needed Anemia, chronic, on-off iron def  Colonoscopy 2004: No polyps EGD 2006 for dysphagia showed hiatal hernia, occult  stricture? Colonoscopy 2010 showed diverticuli Dysphagia: Barium swallow showed dysmotility 08-2014 DJD GERD OSA DX 05/2019  Macular degeneration currently L  Branch retinal vein occlusion  PLAN: Community-acquired pneumonia, acute on chronic diastolic CHF: Admitted to the hospital with above conditions, diagnosed with acute respiratory failure with hypoxia. Responded well to antibiotics, steroids and Lasix. Discharged home in improved condition Community-acquired pneumonia, COPD exacerbation, hypoxemia: Back on routine medications.  O2 sats are checked frequently and they are average 93% to 94%.  Not using oxygen. Will check a CXR on RTC, continue Brovana, albuterol, Pulmicort. Oxygen as needed. HTN: Since the last visit HCTZ was d/c  now on Lasix 40 mg daily.  Also on: -Metoprolol XL:  100 mg -Diovan:   160 mg. Was rec  amlodipine while in the hospital but declines to take d/t h/o edema (I agree)  Amb  BPs 150, 160.  Here BP is very good. Plan:  CMP, CBC, if BMP allows consider increase Lasix.  Continue monitoring BPs. Diastolic CHF: New diagnosis, refer to cardiology >>  optimize medical therapy? Perhaps better amb BP control can be achieved. Continue monitoring amb weights. Aspirin: Discontinue Flu shot today RTC 4 weeks      This visit occurred during the SARS-CoV-2 public health emergency.  Safety protocols were in place, including screening questions prior to the visit, additional usage of staff PPE, and extensive cleaning of exam room while observing appropriate contact time as indicated for disinfecting solutions.

## 2020-11-30 NOTE — Assessment & Plan Note (Addendum)
ASSESSMENT DM--no neuropathy, Metformin DC 04-2015 d/t creatinine HTN  Hyperlipidemia Depression COPD - Asthma Pulmonary nodules per CXR 03-2014, CT 03-2016, stable, no further CTs needed Anemia, chronic, on-off iron def  Colonoscopy 2004: No polyps EGD 2006 for dysphagia showed hiatal hernia, occult  stricture? Colonoscopy 2010 showed diverticuli Dysphagia: Barium swallow showed dysmotility 08-2014 DJD GERD OSA DX 05/2019  Macular degeneration currently L  Branch retinal vein occlusion  PLAN: Community-acquired pneumonia, acute on chronic diastolic CHF: Admitted to the hospital with above conditions, diagnosed with acute respiratory failure with hypoxia. Responded well to antibiotics, steroids and Lasix. Discharged home in improved condition Community-acquired pneumonia, COPD exacerbation, hypoxemia: Back on routine medications.  O2 sats are checked frequently and they are average 93% to 94%.  Not using oxygen. Will check a CXR on RTC, continue Brovana, albuterol, Pulmicort. Oxygen as needed. HTN: Since the last visit HCTZ was d/c  now on Lasix 40 mg daily. Also on: -Metoprolol XL:  100 mg -Diovan:   160 mg. Was rec  amlodipine while in the hospital but declines to take d/t h/o edema (I agree)  Amb  BPs 150, 160.  Here BP is very good. Plan:  CMP, CBC, if BMP allows consider increase Lasix.  Continue monitoring BPs. Diastolic CHF: New diagnosis, refer to cardiology >>  optimize medical therapy? Perhaps better amb BP control can be achieved. Continue monitoring amb weights. Aspirin: Discontinue Flu shot today RTC 4 weeks

## 2020-12-01 ENCOUNTER — Other Ambulatory Visit: Payer: Self-pay | Admitting: Internal Medicine

## 2020-12-02 DIAGNOSIS — J188 Other pneumonia, unspecified organism: Secondary | ICD-10-CM | POA: Diagnosis not present

## 2020-12-02 DIAGNOSIS — I130 Hypertensive heart and chronic kidney disease with heart failure and stage 1 through stage 4 chronic kidney disease, or unspecified chronic kidney disease: Secondary | ICD-10-CM | POA: Diagnosis not present

## 2020-12-02 DIAGNOSIS — E1122 Type 2 diabetes mellitus with diabetic chronic kidney disease: Secondary | ICD-10-CM | POA: Diagnosis not present

## 2020-12-02 DIAGNOSIS — D631 Anemia in chronic kidney disease: Secondary | ICD-10-CM | POA: Diagnosis not present

## 2020-12-02 DIAGNOSIS — N1832 Chronic kidney disease, stage 3b: Secondary | ICD-10-CM | POA: Diagnosis not present

## 2020-12-02 DIAGNOSIS — I5033 Acute on chronic diastolic (congestive) heart failure: Secondary | ICD-10-CM | POA: Diagnosis not present

## 2020-12-03 ENCOUNTER — Telehealth: Payer: Self-pay | Admitting: *Deleted

## 2020-12-03 NOTE — Telephone Encounter (Signed)
Spoke with the patient. BP is 171/76, 168 over 70s. The person who arranged her BP meds sees [name]. I asked for [name] to call us back today, like to check on compliance and see if hydralazine 25 mg 3 times daily is taken as recommended. We could increase dose of hydralazine if needed

## 2020-12-03 NOTE — Telephone Encounter (Signed)
Contact Type Call Who Is Calling Physician / Provider / Hospital Call Type Provider Call Message Only Reason for Call Request to send message to Office Initial Comment Caller states she is calling to report blood pressure of a pt. Pt name is Rebecca Elliott DOB 08/11/1926. Her B/P 178/70. Caller is Jalene Mullet, physical therapist [place]. Phone [phone] is the caller's phone number. Pt has pain in the right knee 9/10. She is taking Tylenol 500. Disp. Time Disposition Final User 12/02/2020 5:28:13 PM General Information Provided Yes Azalia Bilis

## 2020-12-04 DIAGNOSIS — N1832 Chronic kidney disease, stage 3b: Secondary | ICD-10-CM | POA: Diagnosis not present

## 2020-12-04 DIAGNOSIS — I130 Hypertensive heart and chronic kidney disease with heart failure and stage 1 through stage 4 chronic kidney disease, or unspecified chronic kidney disease: Secondary | ICD-10-CM | POA: Diagnosis not present

## 2020-12-04 DIAGNOSIS — D631 Anemia in chronic kidney disease: Secondary | ICD-10-CM | POA: Diagnosis not present

## 2020-12-04 DIAGNOSIS — J188 Other pneumonia, unspecified organism: Secondary | ICD-10-CM | POA: Diagnosis not present

## 2020-12-04 DIAGNOSIS — E1122 Type 2 diabetes mellitus with diabetic chronic kidney disease: Secondary | ICD-10-CM | POA: Diagnosis not present

## 2020-12-04 DIAGNOSIS — I5033 Acute on chronic diastolic (congestive) heart failure: Secondary | ICD-10-CM | POA: Diagnosis not present

## 2020-12-08 DIAGNOSIS — J188 Other pneumonia, unspecified organism: Secondary | ICD-10-CM | POA: Diagnosis not present

## 2020-12-08 DIAGNOSIS — I130 Hypertensive heart and chronic kidney disease with heart failure and stage 1 through stage 4 chronic kidney disease, or unspecified chronic kidney disease: Secondary | ICD-10-CM | POA: Diagnosis not present

## 2020-12-08 DIAGNOSIS — D631 Anemia in chronic kidney disease: Secondary | ICD-10-CM | POA: Diagnosis not present

## 2020-12-08 DIAGNOSIS — E1122 Type 2 diabetes mellitus with diabetic chronic kidney disease: Secondary | ICD-10-CM | POA: Diagnosis not present

## 2020-12-08 DIAGNOSIS — I5033 Acute on chronic diastolic (congestive) heart failure: Secondary | ICD-10-CM | POA: Diagnosis not present

## 2020-12-08 DIAGNOSIS — N1832 Chronic kidney disease, stage 3b: Secondary | ICD-10-CM | POA: Diagnosis not present

## 2020-12-08 MED ORDER — HYDRALAZINE HCL 25 MG PO TABS: 25.0000 mg | ORAL_TABLET | Freq: Four times a day (QID) | ORAL | Status: DC

## 2020-12-08 NOTE — Telephone Encounter (Signed)
Pt [name] returning call. She can be contacted at : [phone]

## 2020-12-08 NOTE — Telephone Encounter (Signed)
Spoke with [name], BPs range from 140-170. She feels well. Plan: Increase hydralazine to 4 times daily. Monitor BPs. They will call me next week and let me know how that is working. Increase hydralazine to 50 mg 3 times daily? Also will check heart rate, increase metoprolol?Rebecca Elliott

## 2020-12-08 NOTE — Addendum Note (Signed)
Addended by: Kathlene November E on: 12/08/2020 07:46 PM   Modules accepted: Orders

## 2020-12-09 DIAGNOSIS — J188 Other pneumonia, unspecified organism: Secondary | ICD-10-CM | POA: Diagnosis not present

## 2020-12-09 DIAGNOSIS — E1122 Type 2 diabetes mellitus with diabetic chronic kidney disease: Secondary | ICD-10-CM | POA: Diagnosis not present

## 2020-12-09 DIAGNOSIS — D631 Anemia in chronic kidney disease: Secondary | ICD-10-CM | POA: Diagnosis not present

## 2020-12-09 DIAGNOSIS — I130 Hypertensive heart and chronic kidney disease with heart failure and stage 1 through stage 4 chronic kidney disease, or unspecified chronic kidney disease: Secondary | ICD-10-CM | POA: Diagnosis not present

## 2020-12-09 DIAGNOSIS — I5033 Acute on chronic diastolic (congestive) heart failure: Secondary | ICD-10-CM | POA: Diagnosis not present

## 2020-12-09 DIAGNOSIS — N1832 Chronic kidney disease, stage 3b: Secondary | ICD-10-CM | POA: Diagnosis not present

## 2020-12-15 DIAGNOSIS — Z7982 Long term (current) use of aspirin: Secondary | ICD-10-CM | POA: Diagnosis not present

## 2020-12-15 DIAGNOSIS — J188 Other pneumonia, unspecified organism: Secondary | ICD-10-CM | POA: Diagnosis not present

## 2020-12-15 DIAGNOSIS — D631 Anemia in chronic kidney disease: Secondary | ICD-10-CM | POA: Diagnosis not present

## 2020-12-15 DIAGNOSIS — N1832 Chronic kidney disease, stage 3b: Secondary | ICD-10-CM | POA: Diagnosis not present

## 2020-12-15 DIAGNOSIS — I130 Hypertensive heart and chronic kidney disease with heart failure and stage 1 through stage 4 chronic kidney disease, or unspecified chronic kidney disease: Secondary | ICD-10-CM | POA: Diagnosis not present

## 2020-12-15 DIAGNOSIS — E876 Hypokalemia: Secondary | ICD-10-CM | POA: Diagnosis not present

## 2020-12-15 DIAGNOSIS — Z9181 History of falling: Secondary | ICD-10-CM | POA: Diagnosis not present

## 2020-12-15 DIAGNOSIS — K219 Gastro-esophageal reflux disease without esophagitis: Secondary | ICD-10-CM | POA: Diagnosis not present

## 2020-12-15 DIAGNOSIS — E785 Hyperlipidemia, unspecified: Secondary | ICD-10-CM | POA: Diagnosis not present

## 2020-12-15 DIAGNOSIS — Z86010 Personal history of colonic polyps: Secondary | ICD-10-CM | POA: Diagnosis not present

## 2020-12-15 DIAGNOSIS — F32A Depression, unspecified: Secondary | ICD-10-CM | POA: Diagnosis not present

## 2020-12-15 DIAGNOSIS — Z6831 Body mass index (BMI) 31.0-31.9, adult: Secondary | ICD-10-CM | POA: Diagnosis not present

## 2020-12-15 DIAGNOSIS — I5033 Acute on chronic diastolic (congestive) heart failure: Secondary | ICD-10-CM | POA: Diagnosis not present

## 2020-12-15 DIAGNOSIS — E1140 Type 2 diabetes mellitus with diabetic neuropathy, unspecified: Secondary | ICD-10-CM | POA: Diagnosis not present

## 2020-12-15 DIAGNOSIS — J441 Chronic obstructive pulmonary disease with (acute) exacerbation: Secondary | ICD-10-CM | POA: Diagnosis not present

## 2020-12-15 DIAGNOSIS — J9601 Acute respiratory failure with hypoxia: Secondary | ICD-10-CM | POA: Diagnosis not present

## 2020-12-15 DIAGNOSIS — I2510 Atherosclerotic heart disease of native coronary artery without angina pectoris: Secondary | ICD-10-CM | POA: Diagnosis not present

## 2020-12-15 DIAGNOSIS — M1990 Unspecified osteoarthritis, unspecified site: Secondary | ICD-10-CM | POA: Diagnosis not present

## 2020-12-15 DIAGNOSIS — E669 Obesity, unspecified: Secondary | ICD-10-CM | POA: Diagnosis not present

## 2020-12-15 DIAGNOSIS — Z7984 Long term (current) use of oral hypoglycemic drugs: Secondary | ICD-10-CM | POA: Diagnosis not present

## 2020-12-15 DIAGNOSIS — E1122 Type 2 diabetes mellitus with diabetic chronic kidney disease: Secondary | ICD-10-CM | POA: Diagnosis not present

## 2020-12-15 DIAGNOSIS — J440 Chronic obstructive pulmonary disease with acute lower respiratory infection: Secondary | ICD-10-CM | POA: Diagnosis not present

## 2020-12-17 DIAGNOSIS — J188 Other pneumonia, unspecified organism: Secondary | ICD-10-CM | POA: Diagnosis not present

## 2020-12-17 DIAGNOSIS — D631 Anemia in chronic kidney disease: Secondary | ICD-10-CM | POA: Diagnosis not present

## 2020-12-17 DIAGNOSIS — E1122 Type 2 diabetes mellitus with diabetic chronic kidney disease: Secondary | ICD-10-CM | POA: Diagnosis not present

## 2020-12-17 DIAGNOSIS — N1832 Chronic kidney disease, stage 3b: Secondary | ICD-10-CM | POA: Diagnosis not present

## 2020-12-17 DIAGNOSIS — I130 Hypertensive heart and chronic kidney disease with heart failure and stage 1 through stage 4 chronic kidney disease, or unspecified chronic kidney disease: Secondary | ICD-10-CM | POA: Diagnosis not present

## 2020-12-17 DIAGNOSIS — I5033 Acute on chronic diastolic (congestive) heart failure: Secondary | ICD-10-CM | POA: Diagnosis not present

## 2020-12-18 ENCOUNTER — Encounter: Payer: Self-pay | Admitting: Internal Medicine

## 2020-12-19 ENCOUNTER — Other Ambulatory Visit: Payer: Self-pay | Admitting: Internal Medicine

## 2020-12-19 ENCOUNTER — Ambulatory Visit: Payer: Medicare Other | Admitting: Internal Medicine

## 2020-12-19 MED ORDER — HYDRALAZINE HCL 25 MG PO TABS: 50.0000 mg | ORAL_TABLET | Freq: Three times a day (TID) | ORAL | 2 refills | Status: DC

## 2020-12-22 ENCOUNTER — Other Ambulatory Visit: Payer: Self-pay | Admitting: Internal Medicine

## 2020-12-22 ENCOUNTER — Telehealth: Payer: Self-pay

## 2020-12-22 DIAGNOSIS — N1832 Chronic kidney disease, stage 3b: Secondary | ICD-10-CM | POA: Diagnosis not present

## 2020-12-22 DIAGNOSIS — Z7984 Long term (current) use of oral hypoglycemic drugs: Secondary | ICD-10-CM

## 2020-12-22 DIAGNOSIS — J440 Chronic obstructive pulmonary disease with acute lower respiratory infection: Secondary | ICD-10-CM | POA: Diagnosis not present

## 2020-12-22 DIAGNOSIS — Z86010 Personal history of colonic polyps: Secondary | ICD-10-CM

## 2020-12-22 DIAGNOSIS — Z9181 History of falling: Secondary | ICD-10-CM

## 2020-12-22 DIAGNOSIS — E1122 Type 2 diabetes mellitus with diabetic chronic kidney disease: Secondary | ICD-10-CM | POA: Diagnosis not present

## 2020-12-22 DIAGNOSIS — D631 Anemia in chronic kidney disease: Secondary | ICD-10-CM | POA: Diagnosis not present

## 2020-12-22 DIAGNOSIS — I2510 Atherosclerotic heart disease of native coronary artery without angina pectoris: Secondary | ICD-10-CM

## 2020-12-22 DIAGNOSIS — F32A Depression, unspecified: Secondary | ICD-10-CM

## 2020-12-22 DIAGNOSIS — Z6831 Body mass index (BMI) 31.0-31.9, adult: Secondary | ICD-10-CM

## 2020-12-22 DIAGNOSIS — E669 Obesity, unspecified: Secondary | ICD-10-CM

## 2020-12-22 DIAGNOSIS — J9601 Acute respiratory failure with hypoxia: Secondary | ICD-10-CM | POA: Diagnosis not present

## 2020-12-22 DIAGNOSIS — E785 Hyperlipidemia, unspecified: Secondary | ICD-10-CM | POA: Diagnosis not present

## 2020-12-22 DIAGNOSIS — K219 Gastro-esophageal reflux disease without esophagitis: Secondary | ICD-10-CM | POA: Diagnosis not present

## 2020-12-22 DIAGNOSIS — J441 Chronic obstructive pulmonary disease with (acute) exacerbation: Secondary | ICD-10-CM | POA: Diagnosis not present

## 2020-12-22 DIAGNOSIS — J188 Other pneumonia, unspecified organism: Secondary | ICD-10-CM | POA: Diagnosis not present

## 2020-12-22 DIAGNOSIS — I5033 Acute on chronic diastolic (congestive) heart failure: Secondary | ICD-10-CM | POA: Diagnosis not present

## 2020-12-22 DIAGNOSIS — I130 Hypertensive heart and chronic kidney disease with heart failure and stage 1 through stage 4 chronic kidney disease, or unspecified chronic kidney disease: Secondary | ICD-10-CM | POA: Diagnosis not present

## 2020-12-22 DIAGNOSIS — M1990 Unspecified osteoarthritis, unspecified site: Secondary | ICD-10-CM

## 2020-12-22 DIAGNOSIS — E1140 Type 2 diabetes mellitus with diabetic neuropathy, unspecified: Secondary | ICD-10-CM | POA: Diagnosis not present

## 2020-12-22 DIAGNOSIS — E876 Hypokalemia: Secondary | ICD-10-CM

## 2020-12-22 DIAGNOSIS — Z7982 Long term (current) use of aspirin: Secondary | ICD-10-CM

## 2020-12-22 NOTE — Telephone Encounter (Signed)
Plan of care signed and faxed back to [facility] at [phone]. Form sent for scanning.

## 2020-12-24 ENCOUNTER — Ambulatory Visit (INDEPENDENT_AMBULATORY_CARE_PROVIDER_SITE_OTHER): Payer: Medicare Other | Admitting: Internal Medicine

## 2020-12-24 ENCOUNTER — Encounter: Payer: Self-pay | Admitting: Internal Medicine

## 2020-12-24 VITALS — BP 142/70 | HR 75 | Temp 98.1°F | Resp 18 | Ht 66.0 in | Wt 191.2 lb

## 2020-12-24 DIAGNOSIS — I5033 Acute on chronic diastolic (congestive) heart failure: Secondary | ICD-10-CM | POA: Diagnosis not present

## 2020-12-24 DIAGNOSIS — N1832 Chronic kidney disease, stage 3b: Secondary | ICD-10-CM | POA: Diagnosis not present

## 2020-12-24 DIAGNOSIS — D631 Anemia in chronic kidney disease: Secondary | ICD-10-CM | POA: Diagnosis not present

## 2020-12-24 DIAGNOSIS — I10 Essential (primary) hypertension: Secondary | ICD-10-CM

## 2020-12-24 DIAGNOSIS — I5032 Chronic diastolic (congestive) heart failure: Secondary | ICD-10-CM | POA: Diagnosis not present

## 2020-12-24 DIAGNOSIS — E1122 Type 2 diabetes mellitus with diabetic chronic kidney disease: Secondary | ICD-10-CM | POA: Diagnosis not present

## 2020-12-24 DIAGNOSIS — I130 Hypertensive heart and chronic kidney disease with heart failure and stage 1 through stage 4 chronic kidney disease, or unspecified chronic kidney disease: Secondary | ICD-10-CM | POA: Diagnosis not present

## 2020-12-24 DIAGNOSIS — M170 Bilateral primary osteoarthritis of knee: Secondary | ICD-10-CM

## 2020-12-24 DIAGNOSIS — E1140 Type 2 diabetes mellitus with diabetic neuropathy, unspecified: Secondary | ICD-10-CM

## 2020-12-24 DIAGNOSIS — J188 Other pneumonia, unspecified organism: Secondary | ICD-10-CM | POA: Diagnosis not present

## 2020-12-24 LAB — HEMOGLOBIN A1C: Hgb A1c MFr Bld: 6.2 % (ref 4.6–6.5)

## 2020-12-24 MED ORDER — HYDROCODONE-ACETAMINOPHEN 5-325 MG PO TABS: 1.0000 | ORAL_TABLET | Freq: Every day | ORAL | 0 refills | Status: DC | PRN

## 2020-12-24 NOTE — Assessment & Plan Note (Signed)
DM: No ambulatory CBGs, Currently on glimepiride, Januvia, check A1c HTN: Since the last visit, started hydralazine, dose gradually increased, currently taking 50 mg 3 times daily. She also takes Lasix, metoprolol, Diovan 160 mg. Ambulatory BPs elevated in the 170, 180 range.  Heart rate 60. BP today is great. We had a long conversation about the issue, recommend no change for now, will set target amb BP in the 150s, do not like to overtreat BP. Also recommend to consider bring her sphygmomanometer here to check for accuracy.  See AVS. Diastolic CHF: No edema today, to see cardiology soon, hopefully they will adjust her BP meds as well. DJD: Having knee pain, only on Tylenol and Voltaren gel.  Encouraged to continue Tylenol 3 times daily, Voltaren twice daily as needed. We had extensive discussion about pain medication, pros and cons discussed, we agreed to take hydrocodone once daily if needed, watch for sedation. RTC 2 to 3 months

## 2020-12-24 NOTE — Patient Instructions (Addendum)
Continue checking your blood pressure once or twice a day, after resting for 5 to 10 minutes and after having a glass of water  BP GOAL is between 110/65 and  150/85. If it is consistently higher or lower, let me know  Consider scheduling a nurse visit here at the office to check your blood pressure machine against 1 of ours to be sure is accurate     To find a good quality blood pressure cuff:  [email or web address]  For pain: Tylenol  500 mg OTC 2 tabs a day every 8 hours as needed for pain If the pain is severe, instead of Tylenol take 1 or 2 tablets of hydrocodone, a painkiller that has Tylenol in it already  GO TO THE LAB : Get the blood work     [place], [place] back for a checkup in 2 to 3 months

## 2020-12-24 NOTE — Progress Notes (Signed)
Subjective:    Patient ID: Rebecca Elliott, female    DOB: [birth year], 87 y.o.   MRN: [id number]  DOS:  12/24/2020 Type of visit - description: f/u, here with her daughter  Since the last office visit is feeling well. We  talked about diabetes, hypertension. When asked admits to occasional chest pain, in the middle of the chest, last 1 or 2 seconds, worse with deep breathing. Shortness of breath at baseline, uses oxygen as needed. Edema is minimal. We also talk about DJD, having bilateral knee pain, but time is severe, pain medication? Still living independently and able to do her ADLs when she is within her house.  Review of Systems See above   Past Medical History:  Diagnosis Date   Allergic rhinitis    COPD  and ASTHMA    Depression    Diabetes mellitus    as an adult   DJD (degenerative joint disease)    GERD (gastroesophageal reflux disease)    and HH w/ reflux   History of colonic polyps    Hyperlipidemia    Hypertension     Past Surgical History:  Procedure Laterality Date   ABDOMINAL HYSTERECTOMY     apparently no oophorectomy   APPENDECTOMY     bilateral breast tumor     BREAST EXCISIONAL BIOPSY Bilateral    one in her 20's and one in her 98's, both benign   CATARACT EXTRACTION Bilateral    Groat   CATARACT EXTRACTION, BILATERAL     CHOLECYSTECTOMY     TONSILLECTOMY     TUBAL LIGATION      Allergies as of 12/24/2020   No Known Allergies      Medication List        Accurate as of December 24, 2020  9:04 AM. If you have any questions, ask your nurse or doctor.          acetaminophen 500 MG tablet Commonly known as: TYLENOL Take 500 mg by mouth every 6 (six) hours as needed for mild pain, headache or fever.   albuterol 108 (90 Base) MCG/ACT inhaler Commonly known as: VENTOLIN HFA Inhale 2 puffs into the lungs every 6 (six) hours as needed for wheezing or shortness of breath.   arformoterol 15 MCG/2ML Nebu Commonly known as: BROVANA Take 2  mLs (15 mcg total) by nebulization in the morning and at bedtime.   atorvastatin 10 MG tablet Commonly known as: LIPITOR TAKE 1 TABLET DAILY   budesonide 0.25 MG/2ML nebulizer solution Commonly known as: PULMICORT TAKE 2 ML VIA NEBULIZATION IN THE MORNING AND AT BEDTIME What changed: See the new instructions.   Centrum Silver Chew Chew 1 tablet by mouth daily.   dextromethorphan-guaiFENesin 30-600 MG 12hr tablet Commonly known as: MUCINEX DM Take 1 tablet by mouth daily.   diclofenac sodium 1 % Gel Commonly known as: VOLTAREN Apply 2 g topically 4 (four) times daily.   docusate sodium 100 MG capsule Commonly known as: COLACE Take 100-200 mg by mouth daily as needed for mild constipation.   famotidine 20 MG tablet Commonly known as: PEPCID TAKE 1 TABLET TWICE A DAY   furosemide 40 MG tablet Commonly known as: Lasix Take 1 tablet (40 mg total) by mouth daily.   glimepiride 2 MG tablet Commonly known as: AMARYL Take 1 tablet (2 mg total) by mouth daily with breakfast.   hydrALAZINE 25 MG tablet Commonly known as: APRESOLINE Take 2 tablets (50 mg total) by mouth 3 (three) times daily.  ipratropium-albuterol 0.5-2.5 (3) MG/3ML Soln Commonly known as: DUONEB Take 3 mLs by nebulization every 6 (six) hours as needed. What changed: reasons to take this   Januvia 50 MG tablet Generic drug: sitaGLIPtin TAKE 1 TABLET DAILY   loratadine 10 MG tablet Commonly known as: CLARITIN Take 10 mg by mouth daily.   metoprolol succinate 100 MG 24 hr tablet Commonly known as: Toprol XL Take 1 tablet (100 mg total) by mouth daily. Take with or immediately following a meal.   NONFORMULARY OR COMPOUNDED ITEM Compression Stockings Dx: M79.89   pantoprazole 40 MG tablet Commonly known as: PROTONIX TAKE 1 TABLET EVERY OTHER DAY   valsartan 160 MG tablet Commonly known as: DIOVAN TAKE 1 TABLET BY MOUTH EVERY DAY   VITAMIN B-12 PO Take 1 tablet by mouth daily.            Objective:   Physical Exam BP (!) 142/70 (BP Location: Left Arm, Patient Position: Sitting, Cuff Size: Small)   Pulse 75   Temp 98.1 F (36.7 C) (Oral)   Resp 18   Ht 5\' 6"  (1.676 m)   Wt 191 lb 4 oz (86.8 kg)   SpO2 95%   BMI 30.87 kg/m  General:   Well developed, NAD, BMI noted. HEENT:  Normocephalic . Face symmetric, atraumatic Lungs:  CTA B Normal respiratory effort, no intercostal retractions, no accessory muscle use. Heart: RRR,  no murmur.  Lower extremities: no pretibial edema bilaterally.  Knees: No effusion. Skin: Not pale. Not jaundice Neurologic:  alert & oriented X3.  Speech normal, gait appropriate for age and assisted by a rolling walker Psych--  Cognition and judgment appear intact.  Cooperative with normal attention span and concentration.  Behavior appropriate. No anxious or depressed appearing.      Assessment    ASSESSMENT DM--no neuropathy, Metformin DC 04-2015 d/t creatinine HTN  Hyperlipidemia Depression COPD - Asthma Pulmonary nodules per CXR 03-2014, CT 03-2016, stable, no further CTs needed Anemia, chronic, on-off iron def  Colonoscopy 2004: No polyps EGD 2006 for dysphagia showed hiatal hernia, occult  stricture? Colonoscopy 2010 showed diverticuli Dysphagia: Barium swallow showed dysmotility 08-2014 DJD GERD OSA DX 05/2019  Macular degeneration currently L  Branch retinal vein occlusion  PLAN: DM: No ambulatory CBGs, Currently on glimepiride, Januvia, check A1c HTN: Since the last visit, started hydralazine, dose gradually increased, currently taking 50 mg 3 times daily. She also takes Lasix, metoprolol, Diovan 160 mg. Ambulatory BPs elevated in the 170, 180 range.  Heart rate 60. BP today is great. We had a long conversation about the issue, recommend no change for now, will set target amb BP in the 150s, do not like to overtreat BP. Also recommend to consider bring her sphygmomanometer here to check for accuracy.  See  AVS. Diastolic CHF: No edema today, to see cardiology soon, hopefully they will adjust her BP meds as well. DJD: Having knee pain, only on Tylenol and Voltaren gel.  Encouraged to continue Tylenol 3 times daily, Voltaren twice daily as needed. We had extensive discussion about pain medication, pros and cons discussed, we agreed to take hydrocodone once daily if needed, watch for sedation. RTC 2 to 3 months  Time spent with the patient and her daughter 30 minutes, long discussion about BP management, pain management, pros and cons of the medication.  this visit occurred during the SARS-CoV-2 public health emergency.  Safety protocols were in place, including screening questions prior to the visit, additional usage of staff PPE,  and extensive cleaning of exam room while observing appropriate contact time as indicated for disinfecting solutions.

## 2020-12-30 ENCOUNTER — Encounter (INDEPENDENT_AMBULATORY_CARE_PROVIDER_SITE_OTHER): Payer: Self-pay | Admitting: Ophthalmology

## 2020-12-30 ENCOUNTER — Ambulatory Visit (INDEPENDENT_AMBULATORY_CARE_PROVIDER_SITE_OTHER): Payer: Medicare Other | Admitting: Ophthalmology

## 2020-12-30 ENCOUNTER — Encounter (INDEPENDENT_AMBULATORY_CARE_PROVIDER_SITE_OTHER): Payer: Medicare Other | Admitting: Ophthalmology

## 2020-12-30 ENCOUNTER — Other Ambulatory Visit: Payer: Self-pay

## 2020-12-30 DIAGNOSIS — H35371 Puckering of macula, right eye: Secondary | ICD-10-CM | POA: Diagnosis not present

## 2020-12-30 DIAGNOSIS — H34832 Tributary (branch) retinal vein occlusion, left eye, with macular edema: Secondary | ICD-10-CM | POA: Diagnosis not present

## 2020-12-30 MED ORDER — FLUORESCEIN SODIUM 10 % IV SOLN
500.0000 mg | INTRAVENOUS | Status: AC | PRN
  Administered 2020-12-30: 500 mg via INTRAVENOUS

## 2020-12-30 MED ORDER — BEVACIZUMAB 2.5 MG/0.1ML IZ SOSY
2.5000 mg | PREFILLED_SYRINGE | INTRAVITREAL | Status: AC | PRN
  Administered 2020-12-30: 2.5 mg via INTRAVITREAL

## 2020-12-30 NOTE — Assessment & Plan Note (Signed)
Minor OD no impact on acuity 

## 2020-12-30 NOTE — Assessment & Plan Note (Signed)
Confirmed by FFA, vastly improved macular findings post Avastin.  Angiographic CME present yet much less active on Avastin at 5-week interval.  Repeat injection today maintain 5-week evaluation

## 2020-12-30 NOTE — Progress Notes (Signed)
12/30/2020     CHIEF COMPLAINT Patient presents for  Chief Complaint  Patient presents with   Retina Follow Up    6 week fu OS and Avastin OS Pt states, "I am pretty sure that my vision is worse than it was. It seems to be more blurred."       HISTORY OF PRESENT ILLNESS: Rebecca Elliott is a 87 y.o. female who presents to the clinic today for:   HPI     Retina Follow Up           Diagnosis: CRVO/BRVO   Laterality: left eye   Onset: 5 weeks ago   Severity: mild   Duration: 5 weeks   Course: stable   Comments: 6 week fu OS and Avastin OS Pt states, "I am pretty sure that my vision is worse than it was. It seems to be more blurred."          Comments   5 wek fp/ ffa l/r OS oct avastin OS.  Post Avastin injection for branch retinal vein occlusion with cystoid macular edema left eye. Patient states vision is stable and unchanged since last visit. Denies any new floaters or FOL.       Last edited by Hurman Horn, MD on 12/30/2020  4:58 PM.      Referring physician: Colon Branch, MD [address] [place],  [place] [address]  HISTORICAL INFORMATION:   Selected notes from the MEDICAL RECORD NUMBER    Lab Results  Component Value Date   HGBA1C 6.2 12/24/2020     CURRENT MEDICATIONS: No current outpatient medications on file. (Ophthalmic Drugs)   No current facility-administered medications for this visit. (Ophthalmic Drugs)   Current Outpatient Medications (Other)  Medication Sig   acetaminophen (TYLENOL) 500 MG tablet Take 500 mg by mouth every 6 (six) hours as needed for mild pain, headache or fever.   albuterol (VENTOLIN HFA) 108 (90 Base) MCG/ACT inhaler Inhale 2 puffs into the lungs every 6 (six) hours as needed for wheezing or shortness of breath.   arformoterol (BROVANA) 15 MCG/2ML NEBU Take 2 mLs (15 mcg total) by nebulization in the morning and at bedtime.   atorvastatin (LIPITOR) 10 MG tablet TAKE 1 TABLET DAILY   budesonide  (PULMICORT) 0.25 MG/2ML nebulizer solution TAKE 2 ML VIA NEBULIZATION IN THE MORNING AND AT BEDTIME (Patient taking differently: Take 0.25 mg by nebulization in the morning and at bedtime.)   Cyanocobalamin (VITAMIN B-12 PO) Take 1 tablet by mouth daily.   dextromethorphan-guaiFENesin (MUCINEX DM) 30-600 MG per 12 hr tablet Take 1 tablet by mouth daily.   diclofenac sodium (VOLTAREN) 1 % GEL Apply 2 g topically 4 (four) times daily.   docusate sodium (COLACE) 100 MG capsule Take 100-200 mg by mouth daily as needed for mild constipation.   famotidine (PEPCID) 20 MG tablet TAKE 1 TABLET TWICE A DAY   furosemide (LASIX) 40 MG tablet Take 1 tablet (40 mg total) by mouth daily.   glimepiride (AMARYL) 2 MG tablet Take 1 tablet (2 mg total) by mouth daily with breakfast.   hydrALAZINE (APRESOLINE) 25 MG tablet Take 2 tablets (50 mg total) by mouth 3 (three) times daily.   HYDROcodone-acetaminophen (NORCO/VICODIN) 5-325 MG tablet Take 1-2 tablets by mouth daily as needed.   ipratropium-albuterol (DUONEB) 0.5-2.5 (3) MG/3ML SOLN Take 3 mLs by nebulization every 6 (six) hours as needed. (Patient taking differently: Take 3 mLs by nebulization every 6 (six)  hours as needed (wheezing/shortness of breath).)   loratadine (CLARITIN) 10 MG tablet Take 10 mg by mouth daily.   metoprolol succinate (TOPROL XL) 100 MG 24 hr tablet Take 1 tablet (100 mg total) by mouth daily. Take with or immediately following a meal.   Multiple Vitamins-Minerals (CENTRUM SILVER) CHEW Chew 1 tablet by mouth daily.   NONFORMULARY OR COMPOUNDED ITEM Compression Stockings Dx: M79.89   pantoprazole (PROTONIX) 40 MG tablet TAKE 1 TABLET EVERY OTHER DAY   sitaGLIPtin (JANUVIA) 50 MG tablet TAKE 1 TABLET DAILY   valsartan (DIOVAN) 160 MG tablet TAKE 1 TABLET BY MOUTH EVERY DAY (Patient taking differently: Take 160 mg by mouth daily.)   No current facility-administered medications for this visit. (Other)      REVIEW OF  SYSTEMS:    ALLERGIES No Known Allergies  PAST MEDICAL HISTORY Past Medical History:  Diagnosis Date   Allergic rhinitis    COPD  and ASTHMA    Depression    Diabetes mellitus    as an adult   DJD (degenerative joint disease)    GERD (gastroesophageal reflux disease)    and HH w/ reflux   History of colonic polyps    Hyperlipidemia    Hypertension    Past Surgical History:  Procedure Laterality Date   ABDOMINAL HYSTERECTOMY     apparently no oophorectomy   APPENDECTOMY     bilateral breast tumor     BREAST EXCISIONAL BIOPSY Bilateral    one in her 20's and one in her 53's, both benign   CATARACT EXTRACTION Bilateral    Groat   CATARACT EXTRACTION, BILATERAL     CHOLECYSTECTOMY     TONSILLECTOMY     TUBAL LIGATION      FAMILY HISTORY Family History  Problem Relation Age of Onset   Breast cancer Sister        x 2   Diabetes Father    Diabetes Sister    Heart attack Brother        early 23s   Heart attack Son    Pancreatic cancer Son    Stomach cancer Sister    Colon cancer Neg Hx     SOCIAL HISTORY Social History   Tobacco Use   Smoking status: Former    Packs/day: 1.00    Years: 35.00    Pack years: 35.00    Types: Cigarettes    Quit date: 01/18/1993    Years since quitting: 27.9   Smokeless tobacco: Never  Substance Use Topics   Alcohol use: No    Alcohol/week: 0.0 standard drinks   Drug use: No         OPHTHALMIC EXAM:  Base Eye Exam     Visual Acuity (ETDRS)       Right Left   Dist cc 20/30 -1 20/100   Dist ph cc  20/70    Correction: Glasses         Tonometry (Tonopen, 4:02 PM)       Right Left   Pressure 19 13         Pupils       Pupils Dark Light Shape React APD   Right PERRL 4 4 Round Minimal None   Left PERRL 4 4 Round Minimal None         Extraocular Movement       Right Left    Full Full         Neuro/Psych     Oriented x3: Yes  Mood/Affect: Normal         Dilation     Both eyes: 1.0%  Mydriacyl, 2.5% Phenylephrine @ 4:02 PM           Slit Lamp and Fundus Exam     External Exam       Right Left   External Normal Normal         Slit Lamp Exam       Right Left   Lids/Lashes Normal Normal   Conjunctiva/Sclera White and quiet White and quiet   Cornea Clear Clear   Anterior Chamber Deep and quiet Deep and quiet   Iris Round and reactive Round and reactive   Lens Posterior chamber intraocular lens Posterior chamber intraocular lens, Centered posterior chamber intraocular lens   Anterior Vitreous Normal Normal         Fundus Exam       Right Left   Posterior Vitreous Normal Posterior vitreous detachment   Disc Normal Normal   C/D Ratio 0.1 0.1   Macula Epiretinal membrane minor Hard drusen, Intraretinal hemorrhage, Macular thickening, Retinal pigment epithelial mottling, Early age related macular degeneration, Microaneurysm large, no cystoid macular edema   Vessels Normal AV nicking, Tortuous, and 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 4:58 PM. Confirmed correct patient, procedure, site, and patient consented.   Anesthesia Topical anesthesia was used. Anesthetic medications included Lidocaine 4%.   Procedure Preparation included Tobramycin 0.3%, 10% betadine to eyelids, 5% betadine to ocular surface. A 30 gauge needle was used.   Injection: 2.5 mg bevacizumab 2.5 MG/0.1ML   Route: Intravitreal, Site: Left Eye   NDC: [id number], Lot: [id number]   Post-op Post injection exam found visual acuity of at least counting fingers. The patient tolerated the procedure well. There were no complications. The patient received written and verbal post  procedure care education. Post injection medications included ocuflox.      OCT, Retina - OU - Both Eyes       Right Eye Quality was good. Scan locations included subfoveal. Central Foveal Thickness: 266. Progression has been stable. Findings include abnormal foveal contour, retinal drusen , no IRF, no SRF.   Left Eye Quality was good. Scan locations included subfoveal. Central Foveal Thickness: 303. Progression has improved. Findings include no SRF, retinal drusen , cystoid macular edema.   Notes At 5-week interval.  Post injection Avastin OS, vastly improved macular anatomy much less retinal thickening much improved perfusion status with less CME.     Fluorescein Angiography Optos (Transit OS)       Injection: 500 mg Fluorescein Sodium 10 %   Route: Intravenous   NDC: [phone]   Right Eye Mid/Late phase findings include normal observations, window defect. Choroidal neovascularization is not present.   Left Eye   Early phase findings include microaneurysm, leakage. Mid/Late phase findings include leakage, microaneurysm. Choroidal neovascularization is not present.   Notes OD with peripheral choroidal folds in a radiating pattern around the nerve in the mid periphery of no pathologic significance.  Peripheral cobblestone degeneration is noted.  No diabetic retinopathy is noted.  OS .  Focal leakages along the inferotemporal venous arcade are noted  however there is prior now less active sectoral branch retinal vein occlusion.  There are changes of cobblestone degeneration in the periphery.     Color Fundus Photography Optos - OU - Both Eyes       Right Eye Disc findings include normal observations. Macula : drusen. Vessels : normal observations. Periphery : RPE abnormality.   Left Eye Disc findings include normal observations. Macula : edema, geographic atrophy, microaneurysms.   Notes OD with peripheral choroidal folds in a radiating pattern around the nerve  in the mid periphery of no pathologic significance.  Peripheral cobblestone degeneration is noted.  OS with macular telangiectasis with parafoveal leakages in the mid to late phases.  There is no choroidal neovascular membrane.  Focal leakages along the inferotemporal venous arcade are noted however there is from prior sectoral branch retinal vein occlusion.  There are changes of cobblestone degeneration in the periphery.  Macular BRVO with a prominent large microaneurysm OS             ASSESSMENT/PLAN:  Branch retinal vein occlusion with macular edema of left eye Confirmed by FFA, vastly improved macular findings post Avastin.  Angiographic CME present yet much less active on Avastin at 5-week interval.  Repeat injection today maintain 5-week evaluation     ICD-10-CM   1. Branch retinal vein occlusion with macular edema of left eye  H34.8320 Intravitreal Injection, Pharmacologic Agent - OS - Left Eye    OCT, Retina - OU - Both Eyes    Fluorescein Angiography Optos (Transit OS)    Color Fundus Photography Optos - OU - Both Eyes    bevacizumab (AVASTIN) SOSY 2.5 mg    Fluorescein Sodium 10 % injection 500 mg      1.  OS vastly improved at 5-week interval post injection for BRVO.  We will maintain injection status with repeat today  2. BRVO confirmed OS by angiography today  3.  Dilate OS next in 5 weeks  Ophthalmic Meds Ordered this visit:  Meds ordered this encounter  Medications   bevacizumab (AVASTIN) SOSY 2.5 mg   Fluorescein Sodium 10 % injection 500 mg       Return in about 5 weeks (around 02/03/2021) for dilate, OS, AVASTIN OCT.  There are no Patient Instructions on file for this visit.   Explained the diagnoses, plan, and follow up with the patient and they expressed understanding.  Patient expressed understanding of the importance of proper follow up care.   Clent Demark Amberlee Garvey M.D. Diseases & Surgery of the Retina and Vitreous Retina [facility] Diabetic [place] 12/30/20     Abbreviations: M myopia (nearsighted); A astigmatism; H hyperopia (farsighted); P presbyopia; Mrx spectacle prescription;  CTL contact lenses; OD right eye; OS left eye; OU both eyes  XT exotropia; ET esotropia; PEK punctate epithelial keratitis; PEE punctate epithelial erosions; DES dry eye syndrome; MGD meibomian gland dysfunction; ATs artificial tears; PFAT's preservative free artificial tears; [place] nuclear sclerotic cataract; PSC posterior subcapsular cataract; ERM epi-retinal membrane; PVD posterior vitreous detachment; RD retinal detachment; DM diabetes mellitus; DR diabetic retinopathy; NPDR non-proliferative diabetic retinopathy; PDR proliferative diabetic retinopathy; CSME clinically significant macular edema; DME diabetic macular edema; dbh dot blot hemorrhages; CWS cotton wool spot; POAG primary open angle glaucoma; C/D cup-to-disc ratio; HVF humphrey visual field; GVF goldmann visual field; OCT optical coherence tomography; IOP intraocular pressure; BRVO Branch retinal vein occlusion; CRVO central retinal vein occlusion; CRAO central retinal artery occlusion; BRAO branch retinal artery occlusion; RT retinal tear; SB  scleral buckle; PPV pars plana vitrectomy; VH Vitreous hemorrhage; PRP panretinal laser photocoagulation; IVK intravitreal kenalog; VMT vitreomacular traction; MH Macular hole;  NVD neovascularization of the disc; NVE neovascularization elsewhere; AREDS age related eye disease study; ARMD age related macular degeneration; POAG primary open angle glaucoma; EBMD epithelial/anterior basement membrane dystrophy; ACIOL anterior chamber intraocular lens; IOL intraocular lens; PCIOL posterior chamber intraocular lens; Phaco/IOL phacoemulsification with intraocular lens placement; [place] photorefractive keratectomy; LASIK laser assisted in situ keratomileusis; HTN hypertension; DM diabetes mellitus; COPD chronic obstructive pulmonary disease

## 2020-12-31 DIAGNOSIS — D631 Anemia in chronic kidney disease: Secondary | ICD-10-CM | POA: Diagnosis not present

## 2020-12-31 DIAGNOSIS — N1832 Chronic kidney disease, stage 3b: Secondary | ICD-10-CM | POA: Diagnosis not present

## 2020-12-31 DIAGNOSIS — J188 Other pneumonia, unspecified organism: Secondary | ICD-10-CM | POA: Diagnosis not present

## 2020-12-31 DIAGNOSIS — E1122 Type 2 diabetes mellitus with diabetic chronic kidney disease: Secondary | ICD-10-CM | POA: Diagnosis not present

## 2020-12-31 DIAGNOSIS — I130 Hypertensive heart and chronic kidney disease with heart failure and stage 1 through stage 4 chronic kidney disease, or unspecified chronic kidney disease: Secondary | ICD-10-CM | POA: Diagnosis not present

## 2020-12-31 DIAGNOSIS — I5033 Acute on chronic diastolic (congestive) heart failure: Secondary | ICD-10-CM | POA: Diagnosis not present

## 2021-01-01 NOTE — Progress Notes (Deleted)
Cardiology Office Note:    Date:  01/01/2021   ID:  Rebecca Elliott, DOB April 15, 1926, MRN [id number]  PCP:  Colon Branch, MD  Cardiologist:  Shirlee More, MD   Referring MD: Colon Branch, MD  ASSESSMENT:    No diagnosis found. PLAN:    In order of problems listed above:  ***  Next appointment   Medication Adjustments/Labs and Tests Ordered: Current medicines are reviewed at length with the patient today.  Concerns regarding medicines are outlined above.  No orders of the defined types were placed in this encounter.  No orders of the defined types were placed in this encounter.    No chief complaint on file. ***  History of Present Illness:    Rebecca Elliott is a 87 y.o. female who is being seen today for the evaluation of congestive heart failure at the request of Colon Branch, MD.  She was admitted to [facility] 11/12/2020 with new onset heart failure and respiratory failure with hypoxia as well as anemia and stage III CKD.  She presented with edema shortness of breath found to have abnormal chest x-ray pleural effusions was placed on antibiotics for pneumonia and given a diuretic with volume overload.  She was treated for community-acquired bilateral lower lobe pneumonia with antibiotics required supplemental oxygen in hospital treated for congestive heart failure however her Lasix was discontinued at discharge from the hospital.  She was not seen by cardiology as an inpatient.  She had an echocardiogram performed 11/06/2020 there is mild concentric LVH normal left ventricular size EF normal 55 to 60% elevated filling pressures left ventricle and left atrium right ventricle normal size and function left atrium is moderately dilated there is moderate mitral under calcification mild valvular regurgitation there was mild aortic regurgitation and aortic stenosis and the IVC was dilated with elevated venous pressures. EKG 11/07/2020 showed sinus rhythm nonspecific T  waves BNP level has been elevated. Component Ref Range & Units 1 mo ago 1 yr ago  Pro B Natriuretic peptide (BNP) 0.0 - 100.0 pg/mL 788.0 High   114.0 High    Chest x-ray 11/05/2020 with basilar infiltrate versus pneumonia versus effusions. She had a chest CT performed March 2018 which showed multiple bilateral pulmonary nodules coronary and aortic atherosclerosis. She is seen by pulmonary 05/23/2019 with a history of lung nodules previous smoker chronic exertional shortness of breath and obstructive lung disease. Past Medical History:  Diagnosis Date   Allergic rhinitis    COPD  and ASTHMA    Depression    Diabetes mellitus    as an adult   DJD (degenerative joint disease)    GERD (gastroesophageal reflux disease)    and HH w/ reflux   History of colonic polyps    Hyperlipidemia    Hypertension     Past Surgical History:  Procedure Laterality Date   ABDOMINAL HYSTERECTOMY     apparently no oophorectomy   APPENDECTOMY     bilateral breast tumor     BREAST EXCISIONAL BIOPSY Bilateral    one in her 20's and one in her 41's, both benign   CATARACT EXTRACTION Bilateral    Groat   CATARACT EXTRACTION, BILATERAL     CHOLECYSTECTOMY     TONSILLECTOMY     TUBAL LIGATION      Current Medications: No outpatient medications have been marked as taking for the 01/02/21 encounter (Appointment) with Richardo Priest, MD.     Allergies:   Patient has  no known allergies.   Social History   Socioeconomic History   Marital status: Widowed    Spouse name: Not on file   Number of children: 5   Years of education: Not on file   Highest education level: Not on file  Occupational History   Occupation: retired   Tobacco Use   Smoking status: Former    Packs/day: 1.00    Years: 35.00    Pack years: 35.00    Types: Cigarettes    Quit date: 01/18/1993    Years since quitting: 27.9   Smokeless tobacco: Never  Substance and Sexual Activity   Alcohol use: No    Alcohol/week: 0.0  standard drinks   Drug use: No   Sexual activity: Not on file  Other Topics Concern   Not on file  Social History Narrative   Household: pt and 2 daughters   widow (1994), children 5 (lost 3 sons,2 alive, daughters)   [name] 75 y/o   GGchildren x 2    Social Determinants of Radio broadcast assistant Strain: Not on file  Food Insecurity: Not on file  Transportation Needs: Not on file  Physical Activity: Not on file  Stress: Not on file  Social Connections: Not on file     Family History: The patient's ***family history includes Breast cancer in her sister; Diabetes in her father and sister; Heart attack in her brother and son; Pancreatic cancer in her son; Stomach cancer in her sister. There is no history of Colon cancer.  ROS:   ROS Please see the history of present illness.    *** All other systems reviewed and are negative.  EKGs/Labs/Other Studies Reviewed:    The following studies were reviewed today: ***  EKG:  EKG is *** ordered today.  The ekg ordered today is personally reviewed and demonstrates ***  Recent Labs: 03/06/2020: TSH 2.18 11/06/2020: ALT 11; Magnesium 2.2 11/08/2020: B Natriuretic Peptide 1,774.3 11/28/2020: BUN 27; Creatinine, Ser 1.84; Hemoglobin 10.7; Platelets 177.0; Potassium 4.0; Pro B Natriuretic peptide (BNP) 788.0; Sodium 143  Recent Lipid Panel    Component Value Date/Time   CHOL 114 03/06/2020 1031   TRIG 113.0 03/06/2020 1031   HDL 34.50 (L) 03/06/2020 1031   CHOLHDL 3 03/06/2020 1031   VLDL 22.6 03/06/2020 1031   LDLCALC 57 03/06/2020 1031    Physical Exam:    VS:  There were no vitals taken for this visit.    Wt Readings from Last 3 Encounters:  12/24/20 191 lb 4 oz (86.8 kg)  11/28/20 190 lb 8 oz (86.4 kg)  11/12/20 190 lb 3.2 oz (86.3 kg)     GEN: *** Well nourished, well developed in no acute distress HEENT: Normal NECK: No JVD; No carotid bruits LYMPHATICS: No lymphadenopathy CARDIAC: ***RRR, no murmurs, rubs,  gallops RESPIRATORY:  Clear to auscultation without rales, wheezing or rhonchi  ABDOMEN: Soft, non-tender, non-distended MUSCULOSKELETAL:  No edema; No deformity  SKIN: Warm and dry NEUROLOGIC:  Alert and oriented x 3 PSYCHIATRIC:  Normal affect     Signed, Shirlee More, MD  01/01/2021 9:47 AM    [place]

## 2021-01-02 ENCOUNTER — Ambulatory Visit: Payer: Medicare Other | Admitting: Cardiology

## 2021-01-02 DIAGNOSIS — Z23 Encounter for immunization: Secondary | ICD-10-CM | POA: Diagnosis not present

## 2021-01-05 ENCOUNTER — Other Ambulatory Visit: Payer: Self-pay | Admitting: Internal Medicine

## 2021-02-09 NOTE — Progress Notes (Signed)
Cardiology Office Note:    Date:  02/10/2021   ID:  [name], DOB October 17, 1926, MRN [id number]  PCP:  Colon Branch, MD  Cardiologist:  Shirlee More, MD   Referring MD: Colon Branch, MD  ASSESSMENT:    1. Hypertensive heart disease with heart failure ([place])   2. Nonrheumatic aortic valve stenosis   3. Nonrheumatic mitral valve regurgitation   4. Nonrheumatic aortic valve insufficiency   5. Mucopurulent chronic bronchitis (HCC)    PLAN:    In order of problems listed above:  Clearly she has developed heart failure coincident with pneumonia and overall is done well she is no longer weighing herself she has a bit of edema we will have her take an extra tablet of her diuretic 2 days a week and I told her daughter that if her weight goes up more than 3 to 4 pounds to take the diuretic twice daily till she is back to baseline continue her sodium restriction and weigh daily and record. Hypertension is well controlled on an appropriate medical regimen including ARB beta-blocker and hydralazine along with her loop diuretic She has mild valvular heart disease aortic stenosis aortic regurgitation mitral regurgitation not uncommon in her age group I doubt a will ever progress require intervention in the family voiced understanding.  Will need a repeat echocardiogram 1 to 2 years Stable COPD continue bronchodilator Stable diabetes currently managed by her PCP Stable hyperlipidemia important to treat comorbidities in this population continue with statin  Next appointment 6 months   Medication Adjustments/Labs and Tests Ordered: Current medicines are reviewed at length with the patient today.  Concerns regarding medicines are outlined above.  No orders of the defined types were placed in this encounter.  Meds ordered this encounter  Medications   furosemide (LASIX) 40 MG tablet    Sig: Take one tablet by mouth daily in the morning. Take one tablet by mouth in the evening on Monday and Friday     Dispense:  40 tablet    Refill:  3     Chief Complaint  Patient presents with   Congestive Heart Failure    History of Present Illness:    Rebecca Elliott is a 88 y.o. female who is being seen today for the evaluation of at the request of Colon Branch, MD.  She had a BNP level performed 2 months ago significantly elevated 778.  She is admitted to the hospital in October with shortness of breath lower extremity edema chest x-ray showed possible bilateral lower lobe pneumonia pleural effusions she was found to be in heart failure treated with antibiotics steroids and diuretic improved and was discharged home on oxygen due to hypoxia.  Her echocardiogram 11/06/2020 showed mild concentric LVH normal ventricular size EF 55 to 60% and she had elevated left atrial pressure with pseudonormal diastolic function.  Right ventricle is normal size function left atrium is moderately dilated mitral annular calcification was present with mild mitral regurgitation aortic valve was calcified with mild regurgitation and mild stenosis.  Her daughter is present participates in evaluation decision making Her mother has COPD and uses home oxygen Since hospitalization she has recovered to baseline which she uses her oxygen a little bit more during the day and she is short of breath even at times with ADLs if she hurries or getting out of the house to come to see me today. She was weighing she stopped she is does not have edema that she perceived and  she is not having orthopnea or PND. Her medications are supervised and her sodium intake is controlled eating predominantly in the home  Past Medical History:  Diagnosis Date   Allergic rhinitis    COPD  and ASTHMA    Depression    Diabetes mellitus    as an adult   DJD (degenerative joint disease)    GERD (gastroesophageal reflux disease)    and HH w/ reflux   History of colonic polyps    Hyperlipidemia    Hypertension     Past Surgical History:   Procedure Laterality Date   ABDOMINAL HYSTERECTOMY     apparently no oophorectomy   APPENDECTOMY     bilateral breast tumor     BREAST EXCISIONAL BIOPSY Bilateral    one in her 20's and one in her 32's, both benign   CATARACT EXTRACTION Bilateral    Groat   CATARACT EXTRACTION, BILATERAL     CHOLECYSTECTOMY     TONSILLECTOMY     TUBAL LIGATION      Current Medications: Current Meds  Medication Sig   acetaminophen (TYLENOL) 500 MG tablet Take 500 mg by mouth every 6 (six) hours as needed for mild pain, headache or fever.   albuterol (VENTOLIN HFA) 108 (90 Base) MCG/ACT inhaler Inhale 2 puffs into the lungs every 6 (six) hours as needed for wheezing or shortness of breath.   arformoterol (BROVANA) 15 MCG/2ML NEBU Take 2 mLs (15 mcg total) by nebulization in the morning and at bedtime.   atorvastatin (LIPITOR) 10 MG tablet TAKE 1 TABLET DAILY   budesonide (PULMICORT) 0.25 MG/2ML nebulizer solution TAKE 2 ML VIA NEBULIZATION IN THE MORNING AND AT BEDTIME (Patient taking differently: Take 0.25 mg by nebulization in the morning and at bedtime.)   Cyanocobalamin (VITAMIN B-12 PO) Take 500 Units by mouth daily.   dextromethorphan-guaiFENesin (MUCINEX DM) 30-600 MG per 12 hr tablet Take 1 tablet by mouth daily.   diclofenac sodium (VOLTAREN) 1 % GEL Apply 2 g topically 4 (four) times daily.   docusate sodium (COLACE) 100 MG capsule Take 100-200 mg by mouth daily as needed for mild constipation.   famotidine (PEPCID) 20 MG tablet TAKE 1 TABLET TWICE A DAY   glimepiride (AMARYL) 2 MG tablet TAKE 1 TABLET BY MOUTH DAILY WITH BREAKFAST   hydrALAZINE (APRESOLINE) 25 MG tablet Take 2 tablets (50 mg total) by mouth 3 (three) times daily.   HYDROcodone-acetaminophen (NORCO/VICODIN) 5-325 MG tablet Take 1-2 tablets by mouth daily as needed. (Patient taking differently: Take 1-2 tablets by mouth daily as needed for moderate pain.)   ipratropium-albuterol (DUONEB) 0.5-2.5 (3) MG/3ML SOLN Take 3 mLs by  nebulization every 6 (six) hours as needed. (Patient taking differently: Take 3 mLs by nebulization every 6 (six) hours as needed (wheezing/shortness of breath).)   loratadine (CLARITIN) 10 MG tablet Take 10 mg by mouth daily.   metoprolol succinate (TOPROL XL) 100 MG 24 hr tablet Take 1 tablet (100 mg total) by mouth daily. Take with or immediately following a meal.   Multiple Vitamins-Minerals (CENTRUM SILVER) CHEW Chew 1 tablet by mouth daily.   NONFORMULARY OR COMPOUNDED ITEM Compression Stockings Dx: M79.89   pantoprazole (PROTONIX) 40 MG tablet TAKE 1 TABLET EVERY OTHER DAY   sitaGLIPtin (JANUVIA) 50 MG tablet TAKE 1 TABLET DAILY   valsartan (DIOVAN) 160 MG tablet TAKE 1 TABLET BY MOUTH EVERY DAY (Patient taking differently: Take 160 mg by mouth daily.)   [DISCONTINUED] furosemide (LASIX) 40 MG tablet Take 1  tablet (40 mg total) by mouth daily.     Allergies:   Patient has no known allergies.   Social History   Socioeconomic History   Marital status: Widowed    Spouse name: Not on file   Number of children: 5   Years of education: Not on file   Highest education level: Not on file  Occupational History   Occupation: retired   Tobacco Use   Smoking status: Former    Packs/day: 1.00    Years: 35.00    Pack years: 35.00    Types: Cigarettes    Quit date: 01/18/1993    Years since quitting: 28.0    Passive exposure: Past   Smokeless tobacco: Never  Vaping Use   Vaping Use: Never used  Substance and Sexual Activity   Alcohol use: No    Alcohol/week: 0.0 standard drinks   Drug use: No   Sexual activity: Not on file  Other Topics Concern   Not on file  Social History Narrative   Household: pt and 2 daughters   widow (1994), children 5 (lost 3 sons,2 alive, daughters)   [name] 36 y/o   GGchildren x 2    Social Determinants of Radio broadcast assistant Strain: Not on file  Food Insecurity: Not on file  Transportation Needs: Not on file  Physical Activity: Not on file   Stress: Not on file  Social Connections: Not on file     Family History: The patient's family history includes Breast cancer in her sister; Diabetes in her father and sister; Heart attack in her brother and son; Pancreatic cancer in her son; Stomach cancer in her sister. There is no history of Colon cancer.  ROS:   ROS Please see the history of present illness.     All other systems reviewed and are negative.  EKGs/Labs/Other Studies Reviewed:    The following studies were reviewed today:   EKG:  EKG performed 11/07/2020 showed sinus rhythm LVH voltage otherwise normal EKG  Recent Labs: 03/06/2020: TSH 2.18 11/06/2020: ALT 11; Magnesium 2.2 11/08/2020: B Natriuretic Peptide 1,774.3 11/28/2020: BUN 27; Creatinine, Ser 1.84; Hemoglobin 10.7; Platelets 177.0; Potassium 4.0; Pro B Natriuretic peptide (BNP) 788.0; Sodium 143  Recent Lipid Panel    Component Value Date/Time   CHOL 114 03/06/2020 1031   TRIG 113.0 03/06/2020 1031   HDL 34.50 (L) 03/06/2020 1031   CHOLHDL 3 03/06/2020 1031   VLDL 22.6 03/06/2020 1031   LDLCALC 57 03/06/2020 1031    Physical Exam:    VS:  BP (!) 148/56 (BP Location: Right Arm)    Pulse 66    Ht 5\' 6"  (1.676 m)    Wt 195 lb (88.5 kg)    SpO2 96%    BMI 31.47 kg/m     Wt Readings from Last 3 Encounters:  02/10/21 195 lb (88.5 kg)  12/24/20 191 lb 4 oz (86.8 kg)  11/28/20 190 lb 8 oz (86.4 kg)     GEN: She appears her age well nourished, well developed in no acute distress HEENT: Normal NECK: No JVD; No carotid bruits LYMPHATICS: No lymphadenopathy CARDIAC: 1/6 aortic outflow murmur cannot auscultate AR 1 of 6 MR RRR, no murmurs, rubs, gallops RESPIRATORY:  Clear to auscultation without rales, wheezing or rhonchi  ABDOMEN: Soft, non-tender, non-distended MUSCULOSKELETAL: Bilateral lower extremity at the knee 1+ pitting edema; No deformity  SKIN: Warm and dry NEUROLOGIC:  Alert and oriented x 3 PSYCHIATRIC:  Normal affect  Signed, Shirlee More, MD  02/10/2021 10:22 AM    [place]

## 2021-02-10 ENCOUNTER — Encounter: Payer: Self-pay | Admitting: Cardiology

## 2021-02-10 ENCOUNTER — Ambulatory Visit (INDEPENDENT_AMBULATORY_CARE_PROVIDER_SITE_OTHER): Payer: Medicare Other | Admitting: Ophthalmology

## 2021-02-10 ENCOUNTER — Ambulatory Visit (INDEPENDENT_AMBULATORY_CARE_PROVIDER_SITE_OTHER): Payer: Medicare Other | Admitting: Cardiology

## 2021-02-10 ENCOUNTER — Encounter (INDEPENDENT_AMBULATORY_CARE_PROVIDER_SITE_OTHER): Payer: Self-pay | Admitting: Ophthalmology

## 2021-02-10 ENCOUNTER — Other Ambulatory Visit: Payer: Self-pay

## 2021-02-10 ENCOUNTER — Telehealth: Payer: Self-pay | Admitting: Internal Medicine

## 2021-02-10 VITALS — BP 148/56 | HR 66 | Ht 66.0 in | Wt 195.0 lb

## 2021-02-10 DIAGNOSIS — I350 Nonrheumatic aortic (valve) stenosis: Secondary | ICD-10-CM | POA: Diagnosis not present

## 2021-02-10 DIAGNOSIS — J411 Mucopurulent chronic bronchitis: Secondary | ICD-10-CM | POA: Diagnosis not present

## 2021-02-10 DIAGNOSIS — H35371 Puckering of macula, right eye: Secondary | ICD-10-CM | POA: Diagnosis not present

## 2021-02-10 DIAGNOSIS — I110 Hypertensive heart disease with heart failure: Secondary | ICD-10-CM | POA: Diagnosis not present

## 2021-02-10 DIAGNOSIS — I340 Nonrheumatic mitral (valve) insufficiency: Secondary | ICD-10-CM | POA: Diagnosis not present

## 2021-02-10 DIAGNOSIS — I351 Nonrheumatic aortic (valve) insufficiency: Secondary | ICD-10-CM

## 2021-02-10 DIAGNOSIS — H34832 Tributary (branch) retinal vein occlusion, left eye, with macular edema: Secondary | ICD-10-CM | POA: Diagnosis not present

## 2021-02-10 DIAGNOSIS — H353131 Nonexudative age-related macular degeneration, bilateral, early dry stage: Secondary | ICD-10-CM

## 2021-02-10 MED ORDER — BEVACIZUMAB 2.5 MG/0.1ML IZ SOSY
2.5000 mg | PREFILLED_SYRINGE | INTRAVITREAL | Status: AC | PRN
  Administered 2021-02-10: 16:00:00 2.5 mg via INTRAVITREAL

## 2021-02-10 MED ORDER — FUROSEMIDE 40 MG PO TABS: ORAL_TABLET | ORAL | 3 refills | Status: DC

## 2021-02-10 MED ORDER — METOPROLOL SUCCINATE ER 100 MG PO TB24: 100.0000 mg | ORAL_TABLET | Freq: Every day | ORAL | 5 refills | Status: DC

## 2021-02-10 NOTE — Assessment & Plan Note (Signed)
No sign of CNVM in either eye

## 2021-02-10 NOTE — Telephone Encounter (Signed)
Rx sent 

## 2021-02-10 NOTE — Progress Notes (Signed)
02/10/2021     CHIEF COMPLAINT Patient presents for  Chief Complaint  Patient presents with   Retina Evaluation      HISTORY OF PRESENT ILLNESS: Rebecca Elliott is a 88 y.o. female who presents to the clinic today for:   HPI     Retina Evaluation           Laterality: left eye         Comments   Follow-up evaluation of branch retinal vein occlusion left eye after recent injection antivegF some 5 weeks previous.      Last edited by Hurman Horn, MD on 02/10/2021  3:10 PM.      Referring physician: Colon Branch, [address] [place],   [address]  HISTORICAL INFORMATION:   Selected notes from the MEDICAL RECORD NUMBER    Lab Results  Component Value Date   HGBA1C 6.2 12/24/2020     CURRENT MEDICATIONS: No current outpatient medications on file. (Ophthalmic Drugs)   No current facility-administered medications for this visit. (Ophthalmic Drugs)   Current Outpatient Medications (Other)  Medication Sig   acetaminophen (TYLENOL) 500 MG tablet Take 500 mg by mouth every 6 (six) hours as needed for mild pain, headache or fever.   albuterol (VENTOLIN HFA) 108 (90 Base) MCG/ACT inhaler Inhale 2 puffs into the lungs every 6 (six) hours as needed for wheezing or shortness of breath.   arformoterol (BROVANA) 15 MCG/2ML NEBU Take 2 mLs (15 mcg total) by nebulization in the morning and at bedtime.   atorvastatin (LIPITOR) 10 MG tablet TAKE 1 TABLET DAILY   budesonide (PULMICORT) 0.25 MG/2ML nebulizer solution TAKE 2 ML VIA NEBULIZATION IN THE MORNING AND AT BEDTIME (Patient taking differently: Take 0.25 mg by nebulization in the morning and at bedtime.)   Cyanocobalamin (VITAMIN B-12 PO) Take 500 Units by mouth daily.   dextromethorphan-guaiFENesin (MUCINEX DM) 30-600 MG per 12 hr tablet Take 1 tablet by mouth daily.   diclofenac sodium (VOLTAREN) 1 % GEL Apply 2 g topically 4 (four) times daily.   docusate sodium (COLACE) 100 MG capsule Take  100-200 mg by mouth daily as needed for mild constipation.   famotidine (PEPCID) 20 MG tablet TAKE 1 TABLET TWICE A DAY   furosemide (LASIX) 40 MG tablet Take one tablet by mouth daily in the morning. Take one tablet by mouth in the evening on Monday and Friday   glimepiride (AMARYL) 2 MG tablet TAKE 1 TABLET BY MOUTH DAILY WITH BREAKFAST   hydrALAZINE (APRESOLINE) 25 MG tablet Take 2 tablets (50 mg total) by mouth 3 (three) times daily.   HYDROcodone-acetaminophen (NORCO/VICODIN) 5-325 MG tablet Take 1-2 tablets by mouth daily as needed. (Patient taking differently: Take 1-2 tablets by mouth daily as needed for moderate pain.)   ipratropium-albuterol (DUONEB) 0.5-2.5 (3) MG/3ML SOLN Take 3 mLs by nebulization every 6 (six) hours as needed. (Patient taking differently: Take 3 mLs by nebulization every 6 (six) hours as needed (wheezing/shortness of breath).)   loratadine (CLARITIN) 10 MG tablet Take 10 mg by mouth daily.   metoprolol succinate (TOPROL XL) 100 MG 24 hr tablet Take 1 tablet (100 mg total) by mouth daily. Take with or immediately following a meal.   Multiple Vitamins-Minerals (CENTRUM SILVER) CHEW Chew 1 tablet by mouth daily.   NONFORMULARY OR COMPOUNDED ITEM Compression Stockings Dx: M79.89   pantoprazole (PROTONIX) 40 MG tablet TAKE 1 TABLET EVERY OTHER DAY   sitaGLIPtin (JANUVIA) 50 MG  tablet TAKE 1 TABLET DAILY   valsartan (DIOVAN) 160 MG tablet TAKE 1 TABLET BY MOUTH EVERY DAY (Patient taking differently: Take 160 mg by mouth daily.)   No current facility-administered medications for this visit. (Other)      REVIEW OF SYSTEMS: ROS   Negative for: Constitutional, Gastrointestinal, Neurological, Skin, Genitourinary, Musculoskeletal, HENT, Endocrine, Cardiovascular, Eyes, Respiratory, Psychiatric, Allergic/Imm, Heme/Lymph Last edited by Hurman Horn, MD on 02/10/2021  4:01 PM.       ALLERGIES No Known Allergies  PAST MEDICAL HISTORY Past Medical History:  Diagnosis  Date   Allergic rhinitis    COPD  and ASTHMA    Depression    Diabetes mellitus    as an adult   DJD (degenerative joint disease)    GERD (gastroesophageal reflux disease)    and HH w/ reflux   History of colonic polyps    Hyperlipidemia    Hypertension    Past Surgical History:  Procedure Laterality Date   ABDOMINAL HYSTERECTOMY     apparently no oophorectomy   APPENDECTOMY     bilateral breast tumor     BREAST EXCISIONAL BIOPSY Bilateral    one in her 20's and one in her 9's, both benign   CATARACT EXTRACTION Bilateral    [place]   CATARACT EXTRACTION, BILATERAL     CHOLECYSTECTOMY     TONSILLECTOMY     TUBAL LIGATION      FAMILY HISTORY Family History  Problem Relation Age of Onset   Breast cancer Sister        x 2   Diabetes Father    Diabetes Sister    Heart attack Brother        early 44s   Heart attack Son    Pancreatic cancer Son    Stomach cancer Sister    Colon cancer Neg Hx     SOCIAL HISTORY Social History   Tobacco Use   Smoking status: Former    Packs/day: 1.00    Years: 35.00    Pack years: 35.00    Types: Cigarettes    Quit date: 01/18/1993    Years since quitting: 28.0    Passive exposure: Past   Smokeless tobacco: Never  Vaping Use   Vaping Use: Never used  Substance Use Topics   Alcohol use: No    Alcohol/week: 0.0 standard drinks   Drug use: No         OPHTHALMIC EXAM:  Base Eye Exam     Visual Acuity (ETDRS)       Right Left   Dist cc 20/25 -1 20/200   Dist ph cc  20/100 -1    Correction: Glasses         Tonometry (Tonopen, 3:16 PM)       Right Left   Pressure 12 11         Pupils       Pupils React APD   Right PERRL Brisk None   Left PERRL Brisk None         Visual Fields       Left Right    Full Full         Extraocular Movement       Right Left    Full, Ortho Full, Ortho         Neuro/Psych     Oriented x3: Yes   Mood/Affect: Normal         Dilation     Left eye: 1.0%  Mydriacyl,  2.5% Phenylephrine @ 3:11 PM           Slit Lamp and Fundus Exam     External Exam       Right Left   External Normal Normal         Slit Lamp Exam       Right Left   Lids/Lashes Normal Normal   Conjunctiva/Sclera White and quiet White and quiet   Cornea Clear Clear   Anterior Chamber Deep and quiet Deep and quiet   Iris Round and reactive Round and reactive   Lens Posterior chamber intraocular lens Posterior chamber intraocular lens, Centered posterior chamber intraocular lens   Anterior Vitreous Normal Normal         Fundus Exam       Right Left   Posterior Vitreous  Posterior vitreous detachment   Disc  Normal   C/D Ratio  0.1   Macula  Hard drusen, Intraretinal hemorrhage, Macular thickening, Retinal pigment epithelial mottling, Early age related macular degeneration, Microaneurysm large, no cystoid macular edema   Vessels  AV nicking, Tortuous, and 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 Scan locations included subfoveal. Central Foveal Thickness: 266. Progression has been stable. Findings include abnormal foveal contour, retinal drusen , no IRF, no SRF.   Left Eye Quality was good. Scan locations included subfoveal. Central Foveal Thickness: 293. Progression has improved. Findings include no SRF, retinal drusen , cystoid macular edema.   Notes At 6-week interval.  Post injection Avastin OS, vastly improved macular anatomy much less retinal thickening much improved perfusion status with less CME.     Intravitreal Injection, Pharmacologic Agent - OS - Left Eye       Time Out 02/10/2021. 3:15 PM. Confirmed correct patient, procedure, site, and patient consented.    Anesthesia Topical anesthesia was used. Anesthetic medications included Lidocaine 4%.   Procedure Preparation included Tobramycin 0.3%, 10% betadine to eyelids, 5% betadine to ocular surface. A 30 gauge needle was used.   Injection: 2.5 mg bevacizumab 2.5 MG/0.1ML   Route: Intravitreal, Site: Left Eye   NDC: [phone], Lot: [id number]   Post-op Post injection exam found visual acuity of at least counting fingers. The patient tolerated the procedure well. There were no complications. The patient received written and verbal post procedure care education. Post injection medications included ocuflox.              ASSESSMENT/PLAN:  Branch retinal vein occlusion with macular edema of left eye Much improved macular condition, CME, on 6-week interval post Avastin for BRVO.  We will repeat today and examination next in 7 weeks  Early stage nonexudative age-related macular degeneration of both eyes No sign of CNVM in either eye  Macular pucker, right eye Minor epiretinal membrane right eye     ICD-10-CM   1. Branch retinal vein occlusion with macular edema of left eye  H34.8320 OCT, Retina - OU - Both Eyes    Intravitreal Injection, Pharmacologic Agent - OS - Left Eye    bevacizumab (AVASTIN) SOSY 2.5 mg    2. Macular pucker, right eye  H35.371     3. Early stage nonexudative age-related  macular degeneration of both eyes  H35.3131       1.  OD, stable with no impact on acuity for mild AMD as well as ERM  2.  OS, vastly improved macular anatomy on Avastin for CME secondary to BRVO.  Currently at 6-week interval.  Repeat injection today follow-up next in 7 weeks  3.  Ophthalmic Meds Ordered this visit:  Meds ordered this encounter  Medications   bevacizumab (AVASTIN) SOSY 2.5 mg       Return in about 7 weeks (around 03/31/2021) for dilate, OS.  There are no Patient Instructions on file for this visit.   Explained the diagnoses, plan, and follow up with the  patient and they expressed understanding.  Patient expressed understanding of the importance of proper follow up care.   Clent Demark Ginette Bradway M.D. Diseases & Surgery of the Retina and Vitreous [facility] [place] 02/10/21     Abbreviations: M myopia (nearsighted); A astigmatism; H hyperopia (farsighted); P presbyopia; Mrx spectacle prescription;  CTL contact lenses; OD right eye; OS left eye; OU both eyes  XT exotropia; ET esotropia; PEK punctate epithelial keratitis; PEE punctate epithelial erosions; DES dry eye syndrome; MGD meibomian gland dysfunction; ATs artificial tears; PFAT's preservative free artificial tears; [place] nuclear sclerotic cataract; PSC posterior subcapsular cataract; ERM epi-retinal membrane; PVD posterior vitreous detachment; RD retinal detachment; DM diabetes mellitus; DR diabetic retinopathy; NPDR non-proliferative diabetic retinopathy; PDR proliferative diabetic retinopathy; CSME clinically significant macular edema; DME diabetic macular edema; dbh dot blot hemorrhages; CWS cotton wool spot; POAG primary open angle glaucoma; C/D cup-to-disc ratio; HVF humphrey visual field; GVF goldmann visual field; OCT optical coherence tomography; IOP intraocular pressure; BRVO Branch retinal vein occlusion; CRVO central retinal vein occlusion; CRAO central retinal artery occlusion; BRAO branch retinal artery occlusion; RT retinal tear; SB scleral buckle; PPV pars plana vitrectomy; VH Vitreous hemorrhage; PRP panretinal laser photocoagulation; IVK intravitreal kenalog; VMT vitreomacular traction; MH Macular hole;  NVD neovascularization of the disc; NVE neovascularization elsewhere; AREDS age related eye disease study; ARMD age related macular degeneration; POAG primary open angle glaucoma; EBMD epithelial/anterior basement membrane dystrophy; ACIOL anterior chamber intraocular lens; IOL intraocular lens; PCIOL posterior chamber intraocular lens; Phaco/IOL phacoemulsification with intraocular  lens placement; [place] photorefractive keratectomy; LASIK laser assisted in situ keratomileusis; HTN hypertension; DM diabetes mellitus; COPD chronic obstructive pulmonary disease

## 2021-02-10 NOTE — Telephone Encounter (Signed)
Medication: metoprolol succinate (TOPROL XL) 100 MG 24 hr tablet  Has the patient contacted their pharmacy? Yes.   (If no, request that the patient contact the pharmacy for the refill.) (If yes, when and what did the pharmacy advise?)  Preferred Pharmacy (with phone number or street name):  [id number] [address], [place] [address]  [place], [place] [address]  Phone:  [phone]  Fax:  [phone]   Agent: Please be advised that RX refills may take up to 3 business days. We ask that you follow-up with your pharmacy.

## 2021-02-10 NOTE — Assessment & Plan Note (Signed)
Much improved macular condition, CME, on 6-week interval post Avastin for BRVO.  We will repeat today and examination next in 7 weeks

## 2021-02-10 NOTE — Assessment & Plan Note (Signed)
Minor epiretinal membrane right eye

## 2021-02-10 NOTE — Patient Instructions (Signed)
Medication Instructions:  Your physician has recommended you make the following change in your medication:  INCREASE: Take an extra tablet of your furosemide on Monday and Friday.  *If you need a refill on your cardiac medications before your next appointment, please call your pharmacy*   Lab Work: None If you have labs (blood work) drawn today and your tests are completely normal, you will receive your results only by: [place] (if you have MyChart) OR A paper copy in the mail If you have any lab test that is abnormal or we need to change your treatment, we will call you to review the results.   Testing/Procedures: None   Follow-Up: At [facility], you and your health needs are our priority.  As part of our continuing mission to provide you with exceptional heart care, we have created designated Provider Care Teams.  These Care Teams include your primary Cardiologist (physician) and Advanced Practice Providers (APPs -  Physician Assistants and Nurse Practitioners) who all work together to provide you with the care you need, when you need it.  We recommend signing up for the patient portal called "MyChart".  Sign up information is provided on this After Visit Summary.  MyChart is used to connect with patients for Virtual Visits (Telemedicine).  Patients are able to view lab/test results, encounter notes, upcoming appointments, etc.  Non-urgent messages can be sent to your provider as well.   To learn more about what you can do with MyChart, go to [email or web address].    Your next appointment:   6 month(s)  The format for your next appointment:   In Person  Provider:   Shirlee More, MD    Other Instructions  Heart Failure  Weigh yourself every morning when you first wake up and record on a calender or note pad, bring this to your office visits. Using a pill tender can help with taking your medications consistently.  Limit your fluid intake to 2 liters daily   Limit your sodium intake to less than 2-3 grams daily. Ask if you need dietary teaching.  If you gain more than 3 pounds (from your dry weight ), double your dose of diuretic for the day.  If you gain more than 5 pounds (from your dry weight), double your dose of lasix and call your heart failure doctor.  Please do not smoke tobacco since it is very bad for your heart.  Please do not drink alcohol since it can worsen your heart failure.Also avoid OTC nonsteroidal drugs, such as advil, aleve and motrin.  Try to exercise for at least 30 minutes every day because this will help your heart be more efficient. You may be eligible for supervised cardiac rehab, ask your physician.    .[email or web address]  DASH diet: Healthy eating to lower your blood pressure The DASH diet emphasizes portion size, eating a variety of foods and getting the right amount of nutrients. Discover how DASH can improve your health and lower your blood pressure. By [facility] Staff  DASH stands for Dietary Approaches to Stop Hypertension. The DASH diet is a lifelong approach to healthy eating that's designed to help treat or prevent high blood pressure (hypertension). The DASH diet encourages you to reduce the sodium in your diet and eat a variety of foods rich in nutrients that help lower blood pressure, such as potassium, calcium and magnesium. By following the DASH diet, you may be able to reduce your blood pressure by a few points in just two weeks.  Over time, your systolic blood pressure could drop by eight to 14 points, which can make a significant difference in your health risks. Because the DASH diet is a healthy way of eating, it offers health benefits besides just lowering blood pressure. The DASH diet is also in line with dietary recommendations to prevent osteoporosis, cancer, heart disease, stroke and diabetes. DASH diet: Sodium levels The DASH diet emphasizes vegetables, fruits and low-fat dairy foods -- and moderate amounts of  whole grains, fish, poultry and nuts. In addition to the standard DASH diet, there is also a lower sodium version of the diet. You can choose the version of the diet that meets your health needs: Standard DASH diet. You can consume up to 2,300 milligrams (mg) of sodium a day.  Lower sodium DASH diet. You can consume up to 1,500 mg of sodium a day. Both versions of the DASH diet aim to reduce the amount of sodium in your diet compared with what you might get in a typical American diet, which can amount to a whopping 3,400 mg of sodium a day or more. The standard DASH diet meets the recommendation from the Dietary Guidelines for Americans to keep daily sodium intake to less than 2,300 mg a day. The American Heart Association recommends 1,500 mg a day of sodium as an upper limit for all adults. If you aren't sure what sodium level is right for you, talk to your doctor. DASH diet: What to eat Both versions of the DASH diet include lots of whole grains, fruits, vegetables and low-fat dairy products. The DASH diet also includes some fish, poultry and legumes, and encourages a small amount of nuts and seeds a few times a week.  You can eat red meat, sweets and fats in small amounts. The DASH diet is low in saturated fat, cholesterol and total fat. Here's a look at the recommended servings from each food group for the 2,000-calorie-a-day DASH diet. Grains: 6 to 8 servings a day Grains include bread, cereal, rice and pasta. Examples of one serving of grains include 1 slice whole-wheat bread, 1 ounce dry cereal, or 1/2 cup cooked cereal, rice or pasta. Focus on whole grains because they have more fiber and nutrients than do refined grains. For instance, use brown rice instead of white rice, whole-wheat pasta instead of regular pasta and whole-grain bread instead of white bread. Look for products labeled "100 percent whole grain" or "100 percent whole wheat."  Grains are naturally low in fat. Keep them this way  by avoiding butter, cream and cheese sauces. Vegetables: 4 to 5 servings a day Tomatoes, carrots, broccoli, sweet potatoes, greens and other vegetables are full of fiber, vitamins, and such minerals as potassium and magnesium. Examples of one serving include 1 cup raw leafy green vegetables or 1/2 cup cut-up raw or cooked vegetables. Don't think of vegetables only as side dishes -- a hearty blend of vegetables served over brown rice or whole-wheat noodles can serve as the main dish for a meal.  Fresh and frozen vegetables are both good choices. When buying frozen and canned vegetables, choose those labeled as low sodium or without added salt.  To increase the number of servings you fit in daily, be creative. In a stir-fry, for instance, cut the amount of meat in half and double up on the vegetables. Fruits: 4 to 5 servings a day Many fruits need little preparation to become a healthy part of a meal or snack. Like vegetables, they're packed with fiber,  potassium and magnesium and are typically low in fat -- coconuts are an exception. Examples of one serving include one medium fruit, 1/2 cup fresh, frozen or canned fruit, or 4 ounces of juice. Have a piece of fruit with meals and one as a snack, then round out your day with a dessert of fresh fruits topped with a dollop of low-fat yogurt.  Leave on edible peels whenever possible. The peels of apples, pears and most fruits with pits add interesting texture to recipes and contain healthy nutrients and fiber.  Remember that citrus fruits and juices, such as grapefruit, can interact with certain medications, so check with your doctor or pharmacist to see if they're OK for you.  If you choose canned fruit or juice, make sure no sugar is added. Dairy: 2 to 3 servings a day Milk, yogurt, cheese and other dairy products are major sources of calcium, vitamin D and protein. But the key is to make sure that you choose dairy products that are low fat or fat-free  because otherwise they can be a major source of fat -- and most of it is saturated. Examples of one serving include 1 cup skim or 1 percent milk, 1 cup low fat yogurt, or 1 1/2 ounces part-skim cheese. Low-fat or fat-free frozen yogurt can help you boost the amount of dairy products you eat while offering a sweet treat. Add fruit for a healthy twist.  If you have trouble digesting dairy products, choose lactose-free products or consider taking an over-the-counter product that contains the enzyme lactase, which can reduce or prevent the symptoms of lactose intolerance.  Go easy on regular and even fat-free cheeses because they are typically high in sodium. Lean meat, poultry and fish: 6 servings or fewer a day Meat can be a rich source of protein, B vitamins, iron and zinc. Choose lean varieties and aim for no more than 6 ounces a day. Cutting back on your meat portion will allow room for more vegetables. Trim away skin and fat from poultry and meat and then bake, broil, grill or roast instead of frying in fat.  Eat heart-healthy fish, such as salmon, herring and tuna. These types of fish are high in omega-3 fatty acids, which can help lower your total cholesterol. Nuts, seeds and legumes: 4 to 5 servings a week Almonds, sunflower seeds, kidney beans, peas, lentils and other foods in this family are good sources of magnesium, potassium and protein. They're also full of fiber and phytochemicals, which are plant compounds that may protect against some cancers and cardiovascular disease. Serving sizes are small and are intended to be consumed only a few times a week because these foods are high in calories. Examples of one serving include 1/3 cup nuts, 2 tablespoons seeds, or 1/2 cup cooked beans or peas.  Nuts sometimes get a bad rap because of their fat content, but they contain healthy types of fat -- monounsaturated fat and omega-3 fatty acids. They're high in calories, however, so eat them in moderation.  Try adding them to stir-fries, salads or cereals.  Soybean-based products, such as tofu and tempeh, can be a good alternative to meat because they contain all of the amino acids your body needs to make a complete protein, just like meat. Fats and oils: 2 to 3 servings a day Fat helps your body absorb essential vitamins and helps your body's immune system. But too much fat increases your risk of heart disease, diabetes and obesity. The DASH diet strives for  a healthy balance by limiting total fat to less than 30 percent of daily calories from fat, with a focus on the healthier monounsaturated fats. Examples of one serving include 1 teaspoon soft margarine, 1 tablespoon mayonnaise or 2 tablespoons salad dressing. Saturated fat and trans fat are the main dietary culprits in increasing your risk of coronary artery disease. DASH helps keep your daily saturated fat to less than 6 percent of your total calories by limiting use of meat, butter, cheese, whole milk, cream and eggs in your diet, along with foods made from lard, solid shortenings, and palm and coconut oils.  Avoid trans fat, commonly found in such processed foods as crackers, baked goods and fried items.  Read food labels on margarine and salad dressing so that you can choose those that are lowest in saturated fat and free of trans fat. Sweets: 5 servings or fewer a week You don't have to banish sweets entirely while following the DASH diet -- just go easy on them. Examples of one serving include 1 tablespoon sugar, jelly or jam, 1/2 cup sorbet, or 1 cup lemonade. When you eat sweets, choose those that are fat-free or low-fat, such as sorbets, fruit ices, jelly beans, hard candy, graham crackers or low-fat cookies.  Artificial sweeteners such as aspartame (NutraSweet, Equal) and sucralose (Splenda) may help satisfy your sweet tooth while sparing the sugar. But remember that you still must use them sensibly. It's OK to swap a diet cola for a regular  cola, but not in place of a more nutritious beverage such as low-fat milk or even plain water.  Cut back on added sugar, which has no nutritional value but can pack on calories. DASH diet: Alcohol and caffeine Drinking too much alcohol can increase blood pressure. The Dietary Guidelines for Americans recommends that men limit alcohol to no more than two drinks a day and women to one or less. The DASH diet doesn't address caffeine consumption. The influence of caffeine on blood pressure remains unclear. But caffeine can cause your blood pressure to rise at least temporarily. If you already have high blood pressure or if you think caffeine is affecting your blood pressure, talk to your doctor about your caffeine consumption. DASH diet and weight loss While the DASH diet is not a weight-loss program, you may indeed lose unwanted pounds because it can help guide you toward healthier food choices. The DASH diet generally includes about 2,000 calories a day. If you're trying to lose weight, you may need to eat fewer calories. You may also need to adjust your serving goals based on your individual circumstances -- something your health care team can help you decide. Tips to cut back on sodium The foods at the core of the DASH diet are naturally low in sodium. So just by following the DASH diet, you're likely to reduce your sodium intake. You also reduce sodium further by: Using sodium-free spices or flavorings with your food instead of salt  Not adding salt when cooking rice, pasta or hot cereal  Rinsing canned foods to remove some of the sodium  Buying foods labeled "no salt added," "sodium-free," "low sodium" or "very low sodium" One teaspoon of table salt has 2,325 mg of sodium. When you read food labels, you may be surprised at just how much sodium some processed foods contain. Even low-fat soups, canned vegetables, ready-to-eat cereals and sliced [place] from the local deli -- foods you may have  considered healthy -- often have lots of sodium. You  may notice a difference in taste when you choose low-sodium food and beverages. If things seem too bland, gradually introduce low-sodium foods and cut back on table salt until you reach your sodium goal. That'll give your palate time to adjust. Using salt-free seasoning blends or herbs and spices may also ease the transition. It can take several weeks for your taste buds to get used to less salty foods. Putting the pieces of the DASH diet together Try these strategies to get started on the DASH diet:  Change gradually. If you now eat only one or two servings of fruits or vegetables a day, try to add a serving at lunch and one at dinner. Rather than switching to all whole grains, start by making one or two of your grain servings whole grains. Increasing fruits, vegetables and whole grains gradually can also help prevent bloating or diarrhea that may occur if you aren't used to eating a diet with lots of fiber. You can also try over-the-counter products to help reduce gas from beans and vegetables.  Reward successes and forgive slip-ups. Reward yourself with a nonfood treat for your accomplishments -- rent a movie, purchase a book or get together with a friend. Everyone slips, especially when learning something new. Remember that changing your lifestyle is a long-term process. Find out what triggered your setback and then just pick up where you left off with the DASH diet.  Add physical activity. To boost your blood pressure lowering efforts even more, consider increasing your physical activity in addition to following the DASH diet. Combining both the DASH diet and physical activity makes it more likely that you'll reduce your blood pressure.  Get support if you need it. If you're having trouble sticking to your diet, talk to your doctor or dietitian about it. You might get some tips that will help you stick to the DASH diet. Remember, healthy eating isn't  an all-or-nothing proposition. What's most important is that, on average, you eat healthier foods with plenty of variety -- both to keep your diet nutritious and to avoid boredom or extremes. And with the DASH diet, you can have both.

## 2021-02-27 ENCOUNTER — Other Ambulatory Visit: Payer: Self-pay | Admitting: Internal Medicine

## 2021-03-07 ENCOUNTER — Other Ambulatory Visit: Payer: Self-pay | Admitting: Internal Medicine

## 2021-03-16 ENCOUNTER — Other Ambulatory Visit: Payer: Self-pay | Admitting: Internal Medicine

## 2021-03-25 ENCOUNTER — Ambulatory Visit: Payer: Medicare Other | Admitting: Internal Medicine

## 2021-03-30 ENCOUNTER — Other Ambulatory Visit: Payer: Self-pay | Admitting: Internal Medicine

## 2021-03-30 ENCOUNTER — Other Ambulatory Visit: Payer: Self-pay | Admitting: Cardiology

## 2021-03-31 ENCOUNTER — Other Ambulatory Visit: Payer: Self-pay | Admitting: Cardiology

## 2021-03-31 ENCOUNTER — Encounter: Payer: Self-pay | Admitting: Internal Medicine

## 2021-03-31 ENCOUNTER — Ambulatory Visit (INDEPENDENT_AMBULATORY_CARE_PROVIDER_SITE_OTHER): Payer: Medicare Other | Admitting: Ophthalmology

## 2021-03-31 ENCOUNTER — Other Ambulatory Visit: Payer: Self-pay

## 2021-03-31 ENCOUNTER — Encounter (INDEPENDENT_AMBULATORY_CARE_PROVIDER_SITE_OTHER): Payer: Self-pay | Admitting: Ophthalmology

## 2021-03-31 ENCOUNTER — Ambulatory Visit (INDEPENDENT_AMBULATORY_CARE_PROVIDER_SITE_OTHER): Payer: Medicare Other | Admitting: Internal Medicine

## 2021-03-31 VITALS — BP 146/76 | HR 64 | Temp 98.0°F | Resp 18 | Ht 66.0 in | Wt 188.0 lb

## 2021-03-31 DIAGNOSIS — E1140 Type 2 diabetes mellitus with diabetic neuropathy, unspecified: Secondary | ICD-10-CM

## 2021-03-31 DIAGNOSIS — Z0000 Encounter for general adult medical examination without abnormal findings: Secondary | ICD-10-CM

## 2021-03-31 DIAGNOSIS — I10 Essential (primary) hypertension: Secondary | ICD-10-CM | POA: Diagnosis not present

## 2021-03-31 DIAGNOSIS — H34832 Tributary (branch) retinal vein occlusion, left eye, with macular edema: Secondary | ICD-10-CM

## 2021-03-31 DIAGNOSIS — H35371 Puckering of macula, right eye: Secondary | ICD-10-CM | POA: Diagnosis not present

## 2021-03-31 DIAGNOSIS — E785 Hyperlipidemia, unspecified: Secondary | ICD-10-CM | POA: Diagnosis not present

## 2021-03-31 LAB — CBC WITH DIFFERENTIAL/PLATELET
Basophils Absolute: 0.1 10*3/uL (ref 0.0–0.1)
Basophils Relative: 1.1 % (ref 0.0–3.0)
Eosinophils Absolute: 0.2 10*3/uL (ref 0.0–0.7)
Eosinophils Relative: 1.9 % (ref 0.0–5.0)
HCT: 36.7 % (ref 36.0–46.0)
Hemoglobin: 11.9 g/dL — ABNORMAL LOW (ref 12.0–15.0)
Lymphocytes Relative: 32.9 % (ref 12.0–46.0)
Lymphs Abs: 3.5 10*3/uL (ref 0.7–4.0)
MCHC: 32.4 g/dL (ref 30.0–36.0)
MCV: 83.7 fl (ref 78.0–100.0)
Monocytes Absolute: 1 10*3/uL (ref 0.1–1.0)
Monocytes Relative: 9.1 % (ref 3.0–12.0)
Neutro Abs: 5.9 10*3/uL (ref 1.4–7.7)
Neutrophils Relative %: 55 % (ref 43.0–77.0)
Platelets: 221 10*3/uL (ref 150.0–400.0)
RBC: 4.39 Mil/uL (ref 3.87–5.11)
RDW: 16.3 % — ABNORMAL HIGH (ref 11.5–15.5)
WBC: 10.7 10*3/uL — ABNORMAL HIGH (ref 4.0–10.5)

## 2021-03-31 LAB — BASIC METABOLIC PANEL
BUN: 37 mg/dL — ABNORMAL HIGH (ref 6–23)
CO2: 32 mEq/L (ref 19–32)
Calcium: 9 mg/dL (ref 8.4–10.5)
Chloride: 101 mEq/L (ref 96–112)
Creatinine, Ser: 2.16 mg/dL — ABNORMAL HIGH (ref 0.40–1.20)
GFR: 19.32 mL/min — ABNORMAL LOW (ref >60.00)
Glucose, Bld: 80 mg/dL (ref 70–99)
Potassium: 3.8 mEq/L (ref 3.5–5.1)
Sodium: 140 mEq/L (ref 135–145)

## 2021-03-31 LAB — LIPID PANEL
Cholesterol: 96 mg/dL (ref 0–200)
HDL: 30.8 mg/dL — ABNORMAL LOW (ref >39.00)
LDL Cholesterol: 46 mg/dL (ref 0–99)
NonHDL: 65.11
Total CHOL/HDL Ratio: 3
Triglycerides: 95 mg/dL (ref 0.0–149.0)
VLDL: 19 mg/dL (ref 0.0–40.0)

## 2021-03-31 LAB — HEMOGLOBIN A1C: Hgb A1c MFr Bld: 6 % (ref 4.6–6.5)

## 2021-03-31 MED ORDER — BEVACIZUMAB 2.5 MG/0.1ML IZ SOSY
2.5000 mg | PREFILLED_SYRINGE | INTRAVITREAL | Status: AC | PRN
  Administered 2021-03-31: 2.5 mg via INTRAVITREAL

## 2021-03-31 NOTE — Assessment & Plan Note (Signed)
Minor OD on the OCT examination next ?

## 2021-03-31 NOTE — Progress Notes (Signed)
? ?Subjective:  ? ? Patient ID: Rebecca Elliott, female    DOB: 13-Aug-1926, 88 y.o.   MRN: [id number] ? ?DOS:  03/31/2021 ?Type of visit - description: cpx, here with her daughter ? ?In general feels well. ?Is reaching out for her oxygen more often. ?Specifically denies chest pain, leg swelling, nausea vomiting. ?BPs at home are slightly elevated ? ?BP Readings from Last 3 Encounters:  ?03/31/21 (!) 146/76  ?02/10/21 (!) 148/56  ?12/24/20 (!) 142/70  ? ? ? ?Review of Systems ?See above  ? ?Past Medical History:  ?Diagnosis Date  ? Allergic rhinitis   ? COPD  and ASTHMA   ? Depression   ? Diabetes mellitus   ? as an adult  ? DJD (degenerative joint disease)   ? GERD (gastroesophageal reflux disease)   ? and HH w/ reflux  ? History of colonic polyps   ? Hyperlipidemia   ? Hypertension   ? ? ?Past Surgical History:  ?Procedure Laterality Date  ? ABDOMINAL HYSTERECTOMY    ? apparently no oophorectomy  ? APPENDECTOMY    ? bilateral breast tumor    ? BREAST EXCISIONAL BIOPSY Bilateral   ? one in her 20's and one in her 50's, both benign  ? CATARACT EXTRACTION Bilateral   ? [place]  ? CATARACT EXTRACTION, BILATERAL    ? CHOLECYSTECTOMY    ? TONSILLECTOMY    ? TUBAL LIGATION    ? ? ?Current Outpatient Medications  ?Medication Instructions  ? acetaminophen (TYLENOL) 500 mg, Oral, Every 6 hours PRN  ? albuterol (VENTOLIN HFA) 108 (90 Base) MCG/ACT inhaler USE 2 INHALATIONS EVERY 6 HOURS AS NEEDED FOR WHEEZING OR SHORTNESS OF BREATH  ? arformoterol (BROVANA) 15 MCG/2ML NEBU TAKE 2 ML BY NEBULIZATION IN THE MORNING AND AT BEDTIME  ? atorvastatin (LIPITOR) 10 MG tablet TAKE 1 TABLET DAILY  ? budesonide (PULMICORT) 0.25 MG/2ML nebulizer solution TAKE 2 ML VIA NEBULIZATION IN THE MORNING AND AT BEDTIME  ? Cyanocobalamin (VITAMIN B-12 PO) 500 Units, Oral, Daily  ? dextromethorphan-guaiFENesin (MUCINEX DM) 30-600 MG per 12 hr tablet 1 tablet, Oral, Daily,    ? diclofenac sodium (VOLTAREN) 2 g, Topical, 4 times daily  ? docusate sodium  (COLACE) 100-200 mg, Oral, Daily PRN  ? famotidine (PEPCID) 20 MG tablet TAKE 1 TABLET TWICE A DAY  ? furosemide (LASIX) 40 MG tablet Take one tablet by mouth daily in the morning. Take one tablet by mouth in the evening on Monday and Friday 40 mg Oral Daily  ? glimepiride (AMARYL) 2 MG tablet TAKE 1 TABLET BY MOUTH DAILY WITH BREAKFAST  ? hydrALAZINE (APRESOLINE) 50 mg, Oral, 3 times daily  ? HYDROcodone-acetaminophen (NORCO/VICODIN) 5-325 MG tablet 1-2 tablets, Oral, Daily PRN  ? ipratropium-albuterol (DUONEB) 0.5-2.5 (3) MG/3ML SOLN 3 mLs, Nebulization, Every 6 hours PRN  ? loratadine (CLARITIN) 10 mg, Oral, Daily,    ? metoprolol succinate (TOPROL XL) 100 mg, Oral, Daily, Take with or immediately following a meal.  ? Multiple Vitamins-Minerals (CENTRUM SILVER) CHEW 1 tablet, Oral, Daily,    ? NONFORMULARY OR COMPOUNDED ITEM Compression Stockings<BR>Dx: M79.89  ? pantoprazole (PROTONIX) 40 MG tablet TAKE 1 TABLET EVERY OTHER DAY  ? sitaGLIPtin (JANUVIA) 50 MG tablet TAKE 1 TABLET DAILY  ? valsartan (DIOVAN) 160 MG tablet TAKE 1 TABLET BY MOUTH EVERY DAY  ? ? ?   ?Objective:  ? Physical Exam ?BP (!) 146/76 (BP Location: Left Arm, Patient Position: Sitting, Cuff Size: Small)   Pulse 64  Temp 98 ?F (36.7 ?C) (Oral)   Resp 18   Ht '5\' 6"'$  (1.676 m)   Wt 188 lb (85.3 kg)   SpO2 97%   BMI 30.34 kg/m?  ?General: ?Well developed, NAD, BMI noted ?Neck: No  thyromegaly  ?HEENT:  ?Normocephalic . Face symmetric, atraumatic ?Lungs:  ?CTA B ?Normal respiratory effort, no intercostal retractions, no accessory muscle use. ?Heart: RRR,  no murmur.  ?Abdomen:  ?Not distended, soft, non-tender.     ?Lower extremities: no pretibial edema bilaterally  ?Skin: Exposed areas without rash. Not pale. Not jaundice ?Neurologic:  ?alert & oriented X3.  ?Speech normal, gait appropriate for age, assisted by a walker ?Strength symmetric and appropriate for age.  ?Psych: ?Cognition and judgment appear intact.  ?Cooperative with normal  attention span and concentration.  ?Behavior appropriate. ?No anxious or depressed appearing. ? ?   ?Assessment   ? ?ASSESSMENT ?DM--no neuropathy, Metformin DC 04-2015 d/t creatinine ?HTN ?Diastolic CHF dx ~ [id number] ?Hyperlipidemia ?Depression ?COPD - Asthma ?Pulmonary nodules per CXR 03-2014, CT 03-2016, stable, no further CTs needed ?Anemia, chronic, on-off iron def  ?Colonoscopy 2004: No polyps ?EGD 2006 for dysphagia showed hiatal hernia, occult  stricture? ?Colonoscopy 2010 showed diverticuli ?Dysphagia: Barium swallow showed dysmotility 08-2014 ?DJD ?GERD ?OSA DX 05/2019 ? Macular degeneration currently ?L  Branch retinal vein occlusion ? ?PLAN: ?Here for CPX ?DM: No ambulatory CBGs, no symptoms of low CBGs.  On glimepiride, Januvia.  Check A1c ?HTN: Ambulatory BPs are sometimes in the 160s.  At the office has been consistently in the 140s.  Recommend to monitor BPs at home closely, see AVS, continue hydralazine, Diovan, metoprolol, Lasix.  Check BMP and CBC ?CHF ?Saw cardiology 02/11/2021, felt to be doing well. ?COPD asthma: On oxygen as needed, has been reaching out for it more often.  O2 sat today without oxygen supplementation 97%.  Continue present care. ?RTC 4 months ? ? ? ? ?This visit occurred during the SARS-CoV-2 public health emergency.  Safety protocols were in place, including screening questions prior to the visit, additional usage of staff PPE, and extensive cleaning of exam room while observing appropriate contact time as indicated for disinfecting solutions.  ? ?

## 2021-03-31 NOTE — Assessment & Plan Note (Signed)
OS today with center involved CME from [facility] slightly worse today at 7-week follow-up.  We will need to repeat injection today after consent signed ? ?Follow-up next OS in 6 weeks, dilate OU next ?

## 2021-03-31 NOTE — Progress Notes (Signed)
? ? ?03/31/2021 ? ?  ? ?CHIEF COMPLAINT ?Patient presents for  ?Chief Complaint  ?Patient presents with  ? Branch Retinal Vein Occlusion  ? ? ? ? ?HISTORY OF PRESENT ILLNESS: ?Rebecca Elliott is a 88 y.o. female who presents to the clinic today for:  ? ?HPI   ?7 weeks for dilate, OS. ?Pt states no changes in vision and medical history. ?Pt denies floaters and FOL. ? ? ?Last edited by Silvestre Moment on 03/31/2021  1:58 PM.  ?  ? ? ?Referring physician: ?Colon Branch, MD ?[address] ?[address] ?[place],  [place] [id number] ? ?HISTORICAL INFORMATION:  ? ?Selected notes from the [facility] ?  ? ?Lab Results  ?Component Value Date  ? HGBA1C 6.0 03/31/2021  ?  ? ?CURRENT MEDICATIONS: ?No current outpatient medications on file. (Ophthalmic Drugs)  ? ?No current facility-administered medications for this visit. (Ophthalmic Drugs)  ? ?Current Outpatient Medications (Other)  ?Medication Sig  ? acetaminophen (TYLENOL) 500 MG tablet Take 500 mg by mouth every 6 (six) hours as needed for mild pain, headache or fever.  ? albuterol (VENTOLIN HFA) 108 (90 Base) MCG/ACT inhaler USE 2 INHALATIONS EVERY 6 HOURS AS NEEDED FOR WHEEZING OR SHORTNESS OF BREATH  ? arformoterol (BROVANA) 15 MCG/2ML NEBU TAKE 2 ML BY NEBULIZATION IN THE MORNING AND AT BEDTIME  ? atorvastatin (LIPITOR) 10 MG tablet TAKE 1 TABLET DAILY  ? budesonide (PULMICORT) 0.25 MG/2ML nebulizer solution TAKE 2 ML VIA NEBULIZATION IN THE MORNING AND AT BEDTIME (Patient taking differently: Take 0.25 mg by nebulization in the morning and at bedtime.)  ? Cyanocobalamin (VITAMIN B-12 PO) Take 500 Units by mouth daily.  ? dextromethorphan-guaiFENesin (MUCINEX DM) 30-600 MG per 12 hr tablet Take 1 tablet by mouth daily.  ? diclofenac sodium (VOLTAREN) 1 % GEL Apply 2 g topically 4 (four) times daily.  ? docusate sodium (COLACE) 100 MG capsule Take 100-200 mg by mouth daily as needed for mild constipation.  ? famotidine (PEPCID) 20 MG tablet TAKE 1 TABLET TWICE A DAY  ? furosemide  (LASIX) 40 MG tablet PLEASE SEE ATTACHED FOR DETAILED DIRECTIONS  ? glimepiride (AMARYL) 2 MG tablet TAKE 1 TABLET BY MOUTH DAILY WITH BREAKFAST  ? hydrALAZINE (APRESOLINE) 25 MG tablet Take 2 tablets (50 mg total) by mouth 3 (three) times daily.  ? HYDROcodone-acetaminophen (NORCO/VICODIN) 5-325 MG tablet Take 1-2 tablets by mouth daily as needed. (Patient taking differently: Take 1-2 tablets by mouth daily as needed for moderate pain.)  ? ipratropium-albuterol (DUONEB) 0.5-2.5 (3) MG/3ML SOLN Take 3 mLs by nebulization every 6 (six) hours as needed. (Patient taking differently: Take 3 mLs by nebulization every 6 (six) hours as needed (wheezing/shortness of breath).)  ? loratadine (CLARITIN) 10 MG tablet Take 10 mg by mouth daily.  ? metoprolol succinate (TOPROL XL) 100 MG 24 hr tablet Take 1 tablet (100 mg total) by mouth daily. Take with or immediately following a meal.  ? Multiple Vitamins-Minerals (CENTRUM SILVER) CHEW Chew 1 tablet by mouth daily.  ? NONFORMULARY OR COMPOUNDED ITEM Compression Stockings ?Dx: L07.86  ? pantoprazole (PROTONIX) 40 MG tablet TAKE 1 TABLET EVERY OTHER DAY  ? sitaGLIPtin (JANUVIA) 50 MG tablet TAKE 1 TABLET DAILY  ? valsartan (DIOVAN) 160 MG tablet TAKE 1 TABLET BY MOUTH EVERY DAY  ? ?No current facility-administered medications for this visit. (Other)  ? ? ? ? ?REVIEW OF SYSTEMS: ? ? ? ?ALLERGIES ?No Known Allergies ? ?PAST MEDICAL HISTORY ?Past Medical History:  ?  Diagnosis Date  ? Allergic rhinitis   ? COPD  and ASTHMA   ? Depression   ? Diabetes mellitus   ? as an adult  ? DJD (degenerative joint disease)   ? GERD (gastroesophageal reflux disease)   ? and HH w/ reflux  ? History of colonic polyps   ? Hyperlipidemia   ? Hypertension   ? ?Past Surgical History:  ?Procedure Laterality Date  ? ABDOMINAL HYSTERECTOMY    ? apparently no oophorectomy  ? APPENDECTOMY    ? bilateral breast tumor    ? BREAST EXCISIONAL BIOPSY Bilateral   ? one in her 20's and one in her 50's, both benign   ? CATARACT EXTRACTION Bilateral   ? [place]  ? CATARACT EXTRACTION, BILATERAL    ? CHOLECYSTECTOMY    ? TONSILLECTOMY    ? TUBAL LIGATION    ? ? ?FAMILY HISTORY ?Family History  ?Problem Relation Age of Onset  ? Breast cancer Sister   ?     x 2  ? Diabetes Father   ? Diabetes Sister   ? Heart attack Brother   ?     early 60s  ? Heart attack Son   ? Pancreatic cancer Son   ? Stomach cancer Sister   ? Colon cancer Neg Hx   ? ? ?SOCIAL HISTORY ?Social History  ? ?Tobacco Use  ? Smoking status: Former  ?  Packs/day: 1.00  ?  Years: 35.00  ?  Pack years: 35.00  ?  Types: Cigarettes  ?  Quit date: 01/18/1993  ?  Years since quitting: 28.2  ?  Passive exposure: Past  ? Smokeless tobacco: Never  ?Vaping Use  ? Vaping Use: Never used  ?Substance Use Topics  ? Alcohol use: No  ?  Alcohol/week: 0.0 standard drinks  ? Drug use: No  ? ?  ? ?  ? ?OPHTHALMIC EXAM: ? ?Base Eye Exam   ? ? Visual Acuity (ETDRS)   ? ?   Right Left  ? Dist cc 20/25 20/200  ? Dist ph cc  20/80 -1  ? ? Correction: Glasses  ? ?  ?  ? ? Tonometry (Tonopen, 2:06 PM)   ? ?   Right Left  ? Pressure 9 9  ? ?  ?  ? ? Pupils   ? ?   Pupils Dark Light Shape React APD  ? Right PERRL 3 3 Round Brisk None  ? Left PERRL    Brisk None  ? ?  ?  ? ? Visual Fields   ? ?   Left Right  ?  Full Full  ? ?  ?  ? ? Extraocular Movement   ? ?   Right Left  ?  Full Full  ? ?  ?  ? ? Neuro/Psych   ? ? Oriented x3: Yes  ? Mood/Affect: Normal  ? ?  ?  ? ? Dilation   ? ? Left eye: 1.0% Mydriacyl, 2.5% Phenylephrine @ 2:06 PM  ? ?  ?  ? ?  ? ?Slit Lamp and Fundus Exam   ? ? External Exam   ? ?   Right Left  ? External Normal Normal  ? ?  ?  ? ? Slit Lamp Exam   ? ?   Right Left  ? Lids/Lashes Normal Normal  ? Conjunctiva/Sclera White and quiet White and quiet  ? Cornea Clear Clear  ? Anterior Chamber Deep and quiet Deep and quiet  ? Iris  Round and reactive Round and reactive  ? Lens Posterior chamber intraocular lens Posterior chamber intraocular lens, Centered posterior chamber  intraocular lens  ? Anterior Vitreous Normal Normal  ? ?  ?  ? ? Fundus Exam   ? ?   Right Left  ? Posterior Vitreous  Posterior vitreous detachment  ? Disc  Normal  ? C/D Ratio  0.1  ? Macula  Hard drusen, Intraretinal hemorrhage, Macular thickening, Retinal pigment epithelial mottling, Early age related macular degeneration, Microaneurysm large, no cystoid macular edema  ? 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 Periphery  Intraretinal hemorrhage scattered due to CRV O  ? ?  ?  ? ?  ? ? ?IMAGING AND PROCEDURES  ?Imaging and Procedures for 03/31/21 ? ?Basic metabolic panel   ? ?  Component Value Flag Ref Range Units Status  ? Sodium 140      135 - 145 mEq/L Final  ? Potassium 3.8      3.5 - 5.1 mEq/L Final  ? Chloride 101      96 - 112 mEq/L Final  ? CO2 32      19 - 32 mEq/L Final  ? Glucose, Bld 80      70 - 99 mg/dL Final  ? BUN 37      6 - 23 mg/dL Final  ? Creatinine, Ser 2.16      0.40 - 1.20 mg/dL Final  ? GFR 19.32      >60.00 mL/min Final  ? Comment:  ? Calculated using the CKD-EPI Creatinine Equation (2021)  ? Calcium 9.0      8.4 - 10.5 mg/dL Final  ? ?  ?  ? ?  ? ?Lipid panel   ? ?  Component Value Flag Ref Range Units Status  ? Cholesterol 96      0 - 200 mg/dL Final  ? Comment:  ? ATP III Classification       Desirable:  < 200 mg/dL               Borderline High:  200 - 239 mg/dL          High:  > = 240 mg/dL  ? Triglycerides 95.0      0.0 - 149.0 mg/dL Final  ? Comment:  ? Normal:  <150 mg/dLBorderline High:  150 - 199 mg/dL  ? HDL 30.80      >39.00 mg/dL Final  ? VLDL 19.0      0.0 - 40.0 mg/dL Final  ? LDL Cholesterol 46      0 - 99 mg/dL Final  ? Total CHOL/HDL Ratio 3        Final  ? Comment:  ?                Men          Women1/2 Average Risk     3.4          3.3Average Risk          5.0          4.42X Average Risk          9.6          7.13X Average Risk          15.0          11.0                       ?  NonHDL 65.11        Final  ? Comment:  ? NOTE:  Non-HDL goal should be 30 mg/dL higher than patient's LDL goal (i.e. LDL goal of < 70 mg/dL, would have non-HDL goal of < 100 mg/dL)  ? ?

## 2021-03-31 NOTE — Patient Instructions (Addendum)
Check the  blood pressure daily at different times, after resting for 20 to 30 minutes. ?BP GOAL is between 110/65 and  135/85. ?If it is consistently higher or lower, let me know ? ?Vaccines you may like to consider: ?Tdap ?Shingrix ? ?GO TO THE LAB : Get the blood work   ? ? ?[place], [place] ?Come back for   a checkup in 4 months ? ? ? ?Fall Prevention in the Home, Adult ?Falls can cause injuries and affect people of all ages. There are many simple things that you can do to make your home safe and to help prevent falls. Ask for help when making these changes, if needed. ?What actions can I take to prevent falls? ?General instructions ?Use good lighting in all rooms. Replace any light bulbs that burn out, turn on lights if it is dark, and use night-lights. ?Place frequently used items in easy-to-reach places. Lower the shelves around your home if necessary. ?Set up furniture so that there are clear paths around it. Avoid moving your furniture around. ?Remove throw rugs and other tripping hazards from the floor. ?Avoid walking on wet floors. ?Fix any uneven floor surfaces. ?Add color or contrast paint or tape to grab bars and handrails in your home. Place contrasting color strips on the first and last steps of staircases. ?When you use a stepladder, make sure that it is completely opened and that the sides and supports are firmly locked. Have someone hold the ladder while you are using it. Do not climb a closed stepladder. ?Know where your pets are when moving through your home. ?What can I do in the bathroom? ?  ?Keep the floor dry. Immediately clean up any water that is on the floor. ?Remove soap buildup in the tub or shower regularly. ?Use nonskid mats or decals on the floor of the tub or shower. ?Attach bath mats securely with double-sided, nonslip rug tape. ?If you need to sit down while you are in the shower, use a plastic, nonslip stool. ?Install grab bars by the toilet  and in the tub and shower. Do not use towel bars as grab bars. ?What can I do in the bedroom? ?Make sure that a bedside light is easy to reach. ?Do not use oversized bedding that reaches the floor. ?Have a firm chair that has side arms to use for getting dressed. ?What can I do in the kitchen? ?Clean up any spills right away. ?If you need to reach for something above you, use a sturdy step stool that has a grab bar. ?Keep electrical cables out of the way. ?Do not use floor polish or wax that makes floors slippery. If you must use wax, make sure that it is non-skid floor wax. ?What can I do with my stairs? ?Do not leave any items on the stairs. ?Make sure that you have a light switch at the top and the bottom of the stairs. Have them installed if you do not have them. ?Make sure that there are handrails on both sides of the stairs. Fix handrails that are broken or loose. Make sure that handrails are as long as the staircases. ?Install non-slip stair treads on all stairs in your home. ?Avoid having throw rugs at the top or bottom of stairs, or secure the rugs with carpet tape to prevent them from moving. ?Choose a carpet design that does not hide the edge of steps on the stairs. ?Check any carpeting to make sure that  it is firmly attached to the stairs. Fix any carpet that is loose or worn. ?What can I do on the outside of my home? ?Use bright outdoor lighting. ?Regularly repair the edges of walkways and driveways and fix any cracks. ?Remove high doorway thresholds. ?Trim any shrubbery on the main path into your home. ?Regularly check that handrails are securely fastened and in good repair. Both sides of all steps should have handrails. ?Install guardrails along the edges of any raised decks or porches. ?Clear walkways of debris and clutter, including tools and rocks. ?Have leaves, snow, and ice cleared regularly. ?Use sand or salt on walkways during winter months. ?In the garage, clean up any spills right away,  including grease or oil spills. ?What other actions can I take? ?Wear closed-toe shoes that fit well and support your feet. Wear shoes that have rubber soles or low heels. ?Use mobility aids as needed, such as canes, walkers, scooters, and crutches. ?Review your medicines with your health care provider. Some medicines can cause dizziness or changes in blood pressure, which increase your risk of falling. ?Talk with your health care provider about other ways that you can decrease your risk of falls. This may include working with a physical therapist or trainer to improve your strength, balance, and endurance. ?Where to find more information ?Centers for Disease Control and Prevention, STEADI: http://www.wolf.info/ ?[employer] on Aging: http://kim-miller.com/ ?Contact a health care provider if: ?You are afraid of falling at home. ?You feel weak, drowsy, or dizzy at home. ?You fall at home. ?Summary ?There are many simple things that you can do to make your home safe and to help prevent falls. ?Ways to make your home safe include removing tripping hazards and installing grab bars in the bathroom. ?Ask for help when making these changes in your home. ?This information is not intended to replace advice given to you by your health care provider. Make sure you discuss any questions you have with your health care provider. ?Document Revised: 08/08/2019 Document Reviewed: 08/08/2019 ?Elsevier Patient Education ? [place]. ? ?

## 2021-03-31 NOTE — Telephone Encounter (Signed)
Furosemide 40 mg /# 120 x 2 refills sent to  [id number] [place]?

## 2021-04-01 NOTE — Assessment & Plan Note (Signed)
Here for CPX ?DM: No ambulatory CBGs, no symptoms of low CBGs.  On glimepiride, Januvia.  Check A1c ?HTN: Ambulatory BPs are sometimes in the 160s.  At the office has been consistently in the 140s.  Recommend to monitor BPs at home closely, see AVS, continue hydralazine, Diovan, metoprolol, Lasix.  Check BMP and CBC ?CHF ?Saw cardiology 02/11/2021, felt to be doing well. ?COPD asthma: On oxygen as needed, has been reaching out for it more often.  O2 sat today without oxygen supplementation 97%.  Continue present care. ?RTC 4 months ? ?

## 2021-04-01 NOTE — Assessment & Plan Note (Signed)
-   Td 2012; booster recommended ?- PNM 23:  2010, 05/2017; pnm 13: 2016 ?-shingles shot: 2011  ?-shingrix discussed  ?- covid vax utd ? --Breast , cercical and colon Ca screening: stop d/t age  ?--  bone density test 10-2006 and 06-2009 normal, vitamin D normal 2011. No need to repeat a DEXA per literature  ?- Labs: BMP, FLP, CBC, A1c ?-ACP  documents filed ?-Fall prevention discussed  ?

## 2021-05-12 ENCOUNTER — Ambulatory Visit (INDEPENDENT_AMBULATORY_CARE_PROVIDER_SITE_OTHER): Payer: Medicare Other | Admitting: Ophthalmology

## 2021-05-12 ENCOUNTER — Encounter (INDEPENDENT_AMBULATORY_CARE_PROVIDER_SITE_OTHER): Payer: Self-pay | Admitting: Ophthalmology

## 2021-05-12 DIAGNOSIS — H34832 Tributary (branch) retinal vein occlusion, left eye, with macular edema: Secondary | ICD-10-CM

## 2021-05-12 DIAGNOSIS — H35371 Puckering of macula, right eye: Secondary | ICD-10-CM

## 2021-05-12 DIAGNOSIS — H353131 Nonexudative age-related macular degeneration, bilateral, early dry stage: Secondary | ICD-10-CM | POA: Diagnosis not present

## 2021-05-12 MED ORDER — BEVACIZUMAB 2.5 MG/0.1ML IZ SOSY
2.5000 mg | PREFILLED_SYRINGE | INTRAVITREAL | Status: AC | PRN
  Administered 2021-05-12: 2.5 mg via INTRAVITREAL

## 2021-05-12 NOTE — Assessment & Plan Note (Signed)
Minor OD no impact on acuity 

## 2021-05-12 NOTE — Assessment & Plan Note (Signed)
Center involved CSME OS, will continue with injection of Avastin to maintain acuity prevent progression ?

## 2021-05-12 NOTE — Progress Notes (Signed)
? ? ?05/12/2021 ? ?  ? ?CHIEF COMPLAINT ?Patient presents for  ?Chief Complaint  ?Patient presents with  ? Branch Retinal Vein Occlusion  ? ? ? ? ?HISTORY OF PRESENT ILLNESS: ?Rebecca Elliott is a 88 y.o. female who presents to the clinic today for:  ? ?HPI   ?6 weeks dilate OU, Avastin OS, OCT. ?Patient states vision is stable and unchanged since last visit. Denies any new floaters or FOL. ? ? ?Currently 6-week follow-up for BRVO with secondary CME OS. ?Last edited by Hurman Horn, MD on 05/12/2021  3:29 PM.  ?  ? ? ?Referring physician: ?Colon Branch, MD ?[address] ?[address] ?[place],  [place] [address] ? ?HISTORICAL INFORMATION:  ? ?Selected notes from the [facility] ?  ? ?Lab Results  ?Component Value Date  ? HGBA1C 6.0 03/31/2021  ?  ? ?CURRENT MEDICATIONS: ?No current outpatient medications on file. (Ophthalmic Drugs)  ? ?No current facility-administered medications for this visit. (Ophthalmic Drugs)  ? ?Current Outpatient Medications (Other)  ?Medication Sig  ? acetaminophen (TYLENOL) 500 MG tablet Take 500 mg by mouth every 6 (six) hours as needed for mild pain, headache or fever.  ? albuterol (VENTOLIN HFA) 108 (90 Base) MCG/ACT inhaler USE 2 INHALATIONS EVERY 6 HOURS AS NEEDED FOR WHEEZING OR SHORTNESS OF BREATH  ? arformoterol (BROVANA) 15 MCG/2ML NEBU TAKE 2 ML BY NEBULIZATION IN THE MORNING AND AT BEDTIME  ? atorvastatin (LIPITOR) 10 MG tablet TAKE 1 TABLET DAILY  ? budesonide (PULMICORT) 0.25 MG/2ML nebulizer solution TAKE 2 ML VIA NEBULIZATION IN THE MORNING AND AT BEDTIME (Patient taking differently: Take 0.25 mg by nebulization in the morning and at bedtime.)  ? Cyanocobalamin (VITAMIN B-12 PO) Take 500 Units by mouth daily.  ? dextromethorphan-guaiFENesin (MUCINEX DM) 30-600 MG per 12 hr tablet Take 1 tablet by mouth daily.  ? diclofenac sodium (VOLTAREN) 1 % GEL Apply 2 g topically 4 (four) times daily.  ? docusate sodium (COLACE) 100 MG capsule Take 100-200 mg by mouth daily as needed  for mild constipation.  ? famotidine (PEPCID) 20 MG tablet TAKE 1 TABLET TWICE A DAY  ? furosemide (LASIX) 40 MG tablet Take one tablet by mouth daily in the morning. Take one tablet by mouth in the evening on Monday and Friday 40 mg Oral Daily  ? glimepiride (AMARYL) 2 MG tablet TAKE 1 TABLET BY MOUTH DAILY WITH BREAKFAST  ? hydrALAZINE (APRESOLINE) 25 MG tablet Take 2 tablets (50 mg total) by mouth 3 (three) times daily.  ? HYDROcodone-acetaminophen (NORCO/VICODIN) 5-325 MG tablet Take 1-2 tablets by mouth daily as needed. (Patient taking differently: Take 1-2 tablets by mouth daily as needed for moderate pain.)  ? ipratropium-albuterol (DUONEB) 0.5-2.5 (3) MG/3ML SOLN Take 3 mLs by nebulization every 6 (six) hours as needed. (Patient taking differently: Take 3 mLs by nebulization every 6 (six) hours as needed (wheezing/shortness of breath).)  ? loratadine (CLARITIN) 10 MG tablet Take 10 mg by mouth daily.  ? metoprolol succinate (TOPROL XL) 100 MG 24 hr tablet Take 1 tablet (100 mg total) by mouth daily. Take with or immediately following a meal.  ? Multiple Vitamins-Minerals (CENTRUM SILVER) CHEW Chew 1 tablet by mouth daily.  ? NONFORMULARY OR COMPOUNDED ITEM Compression Stockings ?Dx: I94.85  ? pantoprazole (PROTONIX) 40 MG tablet TAKE 1 TABLET EVERY OTHER DAY  ? sitaGLIPtin (JANUVIA) 50 MG tablet TAKE 1 TABLET DAILY  ? valsartan (DIOVAN) 160 MG tablet TAKE 1 TABLET BY MOUTH  EVERY DAY  ? ?No current facility-administered medications for this visit. (Other)  ? ? ? ? ?REVIEW OF SYSTEMS: ? ? ? ?ALLERGIES ?No Known Allergies ? ?PAST MEDICAL HISTORY ?Past Medical History:  ?Diagnosis Date  ? Allergic rhinitis   ? COPD  and ASTHMA   ? Depression   ? Diabetes mellitus   ? as an adult  ? DJD (degenerative joint disease)   ? GERD (gastroesophageal reflux disease)   ? and HH w/ reflux  ? History of colonic polyps   ? Hyperlipidemia   ? Hypertension   ? ?Past Surgical History:  ?Procedure Laterality Date  ? ABDOMINAL  HYSTERECTOMY    ? apparently no oophorectomy  ? APPENDECTOMY    ? bilateral breast tumor    ? BREAST EXCISIONAL BIOPSY Bilateral   ? one in her 20's and one in her 50's, both benign  ? CATARACT EXTRACTION Bilateral   ? [place]  ? CATARACT EXTRACTION, BILATERAL    ? CHOLECYSTECTOMY    ? TONSILLECTOMY    ? TUBAL LIGATION    ? ? ?FAMILY HISTORY ?Family History  ?Problem Relation Age of Onset  ? Breast cancer Sister   ?     x 2  ? Diabetes Father   ? Diabetes Sister   ? Heart attack Brother   ?     early 57s  ? Heart attack Son   ? Pancreatic cancer Son   ? Stomach cancer Sister   ? Colon cancer Neg Hx   ? ? ?SOCIAL HISTORY ?Social History  ? ?Tobacco Use  ? Smoking status: Former  ?  Packs/day: 1.00  ?  Years: 35.00  ?  Pack years: 35.00  ?  Types: Cigarettes  ?  Quit date: 01/18/1993  ?  Years since quitting: 28.3  ?  Passive exposure: Past  ? Smokeless tobacco: Never  ?Vaping Use  ? Vaping Use: Never used  ?Substance Use Topics  ? Alcohol use: No  ?  Alcohol/week: 0.0 standard drinks  ? Drug use: No  ? ?  ? ?  ? ?OPHTHALMIC EXAM: ? ?Base Eye Exam   ? ? Visual Acuity (ETDRS)   ? ?   Right Left  ? Dist cc 20/25 20/125 -1  ? Dist ph cc  NI  ? ? Correction: Glasses  ? ?  ?  ? ? Tonometry (Tonopen, 2:41 PM)   ? ?   Right Left  ? Pressure 8 9  ? ?  ?  ? ? Pupils   ? ?   Pupils Dark Light APD  ? Right PERRL 3 3 None  ? Left PERRL 3 3 None  ? ?  ?  ? ? Extraocular Movement   ? ?   Right Left  ?  Full Full  ? ?  ?  ? ? Neuro/Psych   ? ? Oriented x3: Yes  ? Mood/Affect: Normal  ? ?  ?  ? ? Dilation   ? ? Both eyes: 1.0% Mydriacyl, 2.5% Phenylephrine @ 2:41 PM  ? ?  ?  ? ?  ? ?Slit Lamp and Fundus Exam   ? ? External Exam   ? ?   Right Left  ? External Normal Normal  ? ?  ?  ? ? Slit Lamp Exam   ? ?   Right Left  ? Lids/Lashes Normal Normal  ? Conjunctiva/Sclera White and quiet White and quiet  ? Cornea Clear Clear  ? Anterior Chamber Deep and quiet  Deep and quiet  ? Iris Round and reactive Round and reactive  ? Lens Posterior  chamber intraocular lens Posterior chamber intraocular lens, Centered posterior chamber intraocular lens  ? Anterior Vitreous Normal Normal  ? ?  ?  ? ? Fundus Exam   ? ?   Right Left  ? Posterior Vitreous Normal Posterior vitreous detachment  ? Disc Normal Normal  ? C/D Ratio 0.1 0.1  ? Macula Epiretinal membrane minor Hard drusen, Intraretinal hemorrhage, Macular thickening, Retinal pigment epithelial mottling, Early age related macular degeneration, Microaneurysm large, no cystoid macular edema  ? 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 Periphery Normal Intraretinal hemorrhage scattered due to CRV O  ? ?  ?  ? ?  ? ? ?IMAGING AND PROCEDURES  ?Imaging and Procedures for 05/12/21 ? ?OCT, Retina - OU - Both Eyes   ? ?   ?Right Eye ?Quality was good. Scan locations included subfoveal. Central Foveal Thickness: 270. Progression has been stable. Findings include abnormal foveal contour, retinal drusen , no IRF, no SRF.  ? ?Left Eye ?Quality was good. Scan locations included subfoveal. Central Foveal Thickness: 393. Progression has been stable. Findings include no SRF, retinal drusen , cystoid macular edema.  ? ?Notes ?At 6-week interval.  Post injection Avastin OS, vastly improved macular anatomy much less retinal thickening much improved perfusion status with less CME. ? ?  ? ?Intravitreal Injection, Pharmacologic Agent - OS - Left Eye   ? ?   ?Time Out ?05/12/2021. 3:29 PM. Confirmed correct patient, procedure, site, and patient consented.  ? ?Anesthesia ?Topical anesthesia was used. Anesthetic medications included Lidocaine 4%.  ? ?Procedure ?Preparation included 5% betadine to ocular surface, 10% betadine to eyelids. A 30 gauge needle was used.  ? ?Injection: ?2.5 mg bevacizumab 2.5 MG/0.1ML ?  Route: Intravitreal, Site: Left Eye ?  [place]: [id number], Lot: [id number]  ? ?Post-op ?Post injection exam  found visual acuity of at least counting fingers. The patient tolerated the procedure well. There were no complications. The patient received written and verbal post procedure care education. Post injection medi

## 2021-05-12 NOTE — Assessment & Plan Note (Signed)
No sign of CNVM or other complication either ?

## 2021-05-28 ENCOUNTER — Other Ambulatory Visit: Payer: Self-pay | Admitting: Internal Medicine

## 2021-06-22 ENCOUNTER — Other Ambulatory Visit: Payer: Self-pay | Admitting: Internal Medicine

## 2021-06-23 ENCOUNTER — Encounter (INDEPENDENT_AMBULATORY_CARE_PROVIDER_SITE_OTHER): Payer: Medicare Other | Admitting: Ophthalmology

## 2021-06-27 ENCOUNTER — Other Ambulatory Visit: Payer: Self-pay | Admitting: Internal Medicine

## 2021-06-29 ENCOUNTER — Encounter (INDEPENDENT_AMBULATORY_CARE_PROVIDER_SITE_OTHER): Payer: Medicare Other | Admitting: Ophthalmology

## 2021-07-07 ENCOUNTER — Ambulatory Visit (INDEPENDENT_AMBULATORY_CARE_PROVIDER_SITE_OTHER): Payer: Medicare Other | Admitting: Ophthalmology

## 2021-07-07 ENCOUNTER — Encounter (INDEPENDENT_AMBULATORY_CARE_PROVIDER_SITE_OTHER): Payer: Self-pay | Admitting: Ophthalmology

## 2021-07-07 DIAGNOSIS — H35371 Puckering of macula, right eye: Secondary | ICD-10-CM

## 2021-07-07 DIAGNOSIS — H34832 Tributary (branch) retinal vein occlusion, left eye, with macular edema: Secondary | ICD-10-CM

## 2021-07-07 DIAGNOSIS — H353131 Nonexudative age-related macular degeneration, bilateral, early dry stage: Secondary | ICD-10-CM

## 2021-07-07 MED ORDER — BEVACIZUMAB 2.5 MG/0.1ML IZ SOSY
2.5000 mg | PREFILLED_SYRINGE | INTRAVITREAL | Status: AC | PRN
  Administered 2021-07-07: 2.5 mg via INTRAVITREAL

## 2021-07-07 NOTE — Assessment & Plan Note (Signed)
OS with center involved CME stable and good acuity currently at 8-week follow-up interval.  Repeat injection today and follow-up again in 8 weeks

## 2021-07-07 NOTE — Assessment & Plan Note (Signed)
Minor no impact on acuity 

## 2021-07-07 NOTE — Assessment & Plan Note (Signed)
No sign of CNVM 

## 2021-07-07 NOTE — Assessment & Plan Note (Signed)
No detectable diabetic retinopathy 

## 2021-07-07 NOTE — Progress Notes (Signed)
 07/07/2021     CHIEF COMPLAINT Patient presents for  Chief Complaint  Patient presents with   Branch Retinal Vein Occlusion      HISTORY OF PRESENT ILLNESS: Rebecca Elliott is a 88 y.o. female who presents to the clinic today for:   HPI   8 weeks dilate OS Avastin OS OCT. Patient states vision is stable and unchanged since last visit. Denies any new floaters or FOL. Patient is not allergic to anything.  Last edited by Laurin Coder on 07/07/2021  9:44 AM.      Referring physician: Colon Branch, MD [address] [place],  [place] [address]  HISTORICAL INFORMATION:   Selected notes from the MEDICAL RECORD NUMBER    Lab Results  Component Value Date   HGBA1C 6.0 03/31/2021     CURRENT MEDICATIONS: No current outpatient medications on file. (Ophthalmic Drugs)   No current facility-administered medications for this visit. (Ophthalmic Drugs)   Current Outpatient Medications (Other)  Medication Sig   glimepiride (AMARYL) 2 MG tablet TAKE 1 TABLET BY MOUTH EVERY DAY WITH BREAKFAST   metoprolol succinate (TOPROL-XL) 100 MG 24 hr tablet TAKE 1 TABLET BY MOUTH DAILY. TAKE WITH OR IMMEDIATELY FOLLOWING A MEAL.   valsartan (DIOVAN) 160 MG tablet TAKE 1 TABLET BY MOUTH EVERY DAY   acetaminophen (TYLENOL) 500 MG tablet Take 500 mg by mouth every 6 (six) hours as needed for mild pain, headache or fever.   albuterol (VENTOLIN HFA) 108 (90 Base) MCG/ACT inhaler USE 2 INHALATIONS EVERY 6 HOURS AS NEEDED FOR WHEEZING OR SHORTNESS OF BREATH   arformoterol (BROVANA) 15 MCG/2ML NEBU TAKE 2 ML BY NEBULIZATION IN THE MORNING AND AT BEDTIME   atorvastatin (LIPITOR) 10 MG tablet TAKE 1 TABLET DAILY   budesonide (PULMICORT) 0.25 MG/2ML nebulizer solution TAKE 2 ML VIA NEBULIZATION IN THE MORNING AND AT BEDTIME (Patient taking differently: Take 0.25 mg by nebulization in the morning and at bedtime.)   Cyanocobalamin (VITAMIN B-12 PO) Take 500 Units by mouth daily.    dextromethorphan-guaiFENesin (MUCINEX DM) 30-600 MG per 12 hr tablet Take 1 tablet by mouth daily.   diclofenac sodium (VOLTAREN) 1 % GEL Apply 2 g topically 4 (four) times daily.   docusate sodium (COLACE) 100 MG capsule Take 100-200 mg by mouth daily as needed for mild constipation.   famotidine (PEPCID) 20 MG tablet TAKE 1 TABLET TWICE A DAY   furosemide (LASIX) 40 MG tablet Take one tablet by mouth daily in the morning. Take one tablet by mouth in the evening on Monday and Friday 40 mg Oral Daily   hydrALAZINE (APRESOLINE) 25 MG tablet Take 2 tablets (50 mg total) by mouth 3 (three) times daily.   HYDROcodone-acetaminophen (NORCO/VICODIN) 5-325 MG tablet Take 1-2 tablets by mouth daily as needed. (Patient taking differently: Take 1-2 tablets by mouth daily as needed for moderate pain.)   ipratropium-albuterol (DUONEB) 0.5-2.5 (3) MG/3ML SOLN Take 3 mLs by nebulization every 6 (six) hours as needed. (Patient taking differently: Take 3 mLs by nebulization every 6 (six) hours as needed (wheezing/shortness of breath).)   loratadine (CLARITIN) 10 MG tablet Take 10 mg by mouth daily.   Multiple Vitamins-Minerals (CENTRUM SILVER) CHEW Chew 1 tablet by mouth daily.   NONFORMULARY OR COMPOUNDED ITEM Compression Stockings Dx: M79.89   pantoprazole (PROTONIX) 40 MG tablet TAKE 1 TABLET EVERY OTHER DAY   sitaGLIPtin (JANUVIA) 50 MG tablet TAKE 1 TABLET DAILY   No current facility-administered medications  for this visit. (Other)      REVIEW OF SYSTEMS: ROS   Negative for: Constitutional, Gastrointestinal, Neurological, Skin, Genitourinary, Musculoskeletal, HENT, Endocrine, Cardiovascular, Eyes, Respiratory, Psychiatric, Allergic/Imm, Heme/Lymph Last edited by Hurman Horn, MD on 07/07/2021 10:24 AM.       ALLERGIES No Known Allergies  PAST MEDICAL HISTORY Past Medical History:  Diagnosis Date   Allergic rhinitis    COPD  and ASTHMA    Depression    Diabetes mellitus    as an adult    DJD (degenerative joint disease)    GERD (gastroesophageal reflux disease)    and HH w/ reflux   History of colonic polyps    Hyperlipidemia    Hypertension    Past Surgical History:  Procedure Laterality Date   ABDOMINAL HYSTERECTOMY     apparently no oophorectomy   APPENDECTOMY     bilateral breast tumor     BREAST EXCISIONAL BIOPSY Bilateral    one in her 20's and one in her 55's, both benign   CATARACT EXTRACTION Bilateral    Groat   CATARACT EXTRACTION, BILATERAL     CHOLECYSTECTOMY     TONSILLECTOMY     TUBAL LIGATION      FAMILY HISTORY Family History  Problem Relation Age of Onset   Breast cancer Sister        x 2   Diabetes Father    Diabetes Sister    Heart attack Brother        early 63s   Heart attack Son    Pancreatic cancer Son    Stomach cancer Sister    Colon cancer Neg Hx     SOCIAL HISTORY Social History   Tobacco Use   Smoking status: Former    Packs/day: 1.00    Years: 35.00    Total pack years: 35.00    Types: Cigarettes    Quit date: 01/18/1993    Years since quitting: 28.4    Passive exposure: Past   Smokeless tobacco: Never  Vaping Use   Vaping Use: Never used  Substance Use Topics   Alcohol use: No    Alcohol/week: 0.0 standard drinks of alcohol   Drug use: No         OPHTHALMIC EXAM:  Base Eye Exam     Visual Acuity (ETDRS)       Right Left   Dist cc 20/30 -1 20/160 -1   Dist ph cc NI 20/100 -1    Correction: Glasses         Tonometry (Tonopen, 9:45 AM)       Right Left   Pressure 8 9         Pupils       Pupils APD   Right PERRL None   Left PERRL None         Extraocular Movement       Right Left    Full Full         Neuro/Psych     Oriented x3: Yes   Mood/Affect: Normal         Dilation     Left eye: 1.0% Mydriacyl, 2.5% Phenylephrine @ 9:45 AM           Slit Lamp and Fundus Exam     External Exam       Right Left   External Normal Normal         Slit Lamp Exam        Right Left  Lids/Lashes Normal Normal   Conjunctiva/Sclera White and quiet White and quiet   Cornea Clear Clear   Anterior Chamber Deep and quiet Deep and quiet   Iris Round and reactive Round and reactive   Lens Posterior chamber intraocular lens Posterior chamber intraocular lens, Centered posterior chamber intraocular lens   Anterior Vitreous Normal Normal         Fundus Exam       Right Left   Posterior Vitreous Posterior vitreous detachment Posterior vitreous detachment   Disc Normal Normal   C/D Ratio 0.2 0.1   Macula Early age related macular degeneration, Hard drusen, no macular thickening, Epiretinal membrane clinically but no topographic distortion Hard drusen, Intraretinal hemorrhage, Macular thickening, Retinal pigment epithelial mottling, Early age related macular degeneration, Microaneurysm large, no cystoid macular edema   Vessels Normal AV nicking, Tortuous, and 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   Left Eye Central Foveal Thickness: 418.      Intravitreal Injection, Pharmacologic Agent - OS - Left Eye       Time Out 07/07/2021. 10:25 AM. Confirmed correct patient, procedure, site, and patient consented.   Anesthesia Topical anesthesia was used. Anesthetic medications included Lidocaine 4%.   Procedure Preparation included 5% betadine to ocular surface, 10% betadine to eyelids. A 30 gauge needle was used.   Injection: 2.5 mg bevacizumab 2.5 MG/0.1ML   Route: Intravitreal, Site: Left Eye   NDC: [phone], Lot: [id number], Expiration date: 08/16/2021   Post-op Post injection exam found visual acuity of at least counting fingers. The patient tolerated  the procedure well. There were no complications. The patient received written and verbal post procedure care education. Post injection medications included ocuflox.              ASSESSMENT/PLAN:  Early stage nonexudative age-related macular degeneration of both eyes No sign of CNVM  Macular pucker, right eye Minor no impact on acuity  Type 2 diabetes mellitus with diabetic neuropathy, unspecified (HCC) No detectable diabetic retinopathy  Branch retinal vein occlusion with macular edema of left eye OS with center involved CME stable and good acuity currently at 8-week follow-up interval.  Repeat injection today and follow-up again in 8 weeks     ICD-10-CM   1. Branch retinal vein occlusion with macular edema of left eye  H34.8320 OCT, Retina - OU - Both Eyes    Intravitreal Injection, Pharmacologic Agent - OS - Left Eye    bevacizumab (AVASTIN) SOSY 2.5 mg    2. Early stage nonexudative age-related macular degeneration of both eyes  H35.3131     3. Macular pucker, right eye  H35.371       1.  OD, no CNVM, no significant impact of minor ERM. 2.  OS center involved CME and intraretinal hemorrhage clinically.  Repeat injection today Avastin to maintain acuity  3.  Ophthalmic Meds Ordered this visit:  Meds ordered this encounter  Medications   bevacizumab (AVASTIN) SOSY 2.5 mg       Return in about 8 weeks (around ) for DILATE OU, AVASTIN OCT, OS.  There are no Patient Instructions on file for this visit.   Explained  the diagnoses, plan, and follow up with the patient and they expressed understanding.  Patient expressed understanding of the importance of proper follow up care.   Clent Demark Angelica Wix M.D. Diseases & Surgery of the Retina and Vitreous [facility] [place] 07/07/21     Abbreviations: M myopia (nearsighted); A astigmatism; H hyperopia (farsighted); P presbyopia; Mrx spectacle prescription;  CTL contact lenses; OD right eye; OS left  eye; OU both eyes  XT exotropia; ET esotropia; PEK punctate epithelial keratitis; PEE punctate epithelial erosions; DES dry eye syndrome; MGD meibomian gland dysfunction; ATs artificial tears; PFAT's preservative free artificial tears; [place] nuclear sclerotic cataract; PSC posterior subcapsular cataract; ERM epi-retinal membrane; PVD posterior vitreous detachment; RD retinal detachment; DM diabetes mellitus; DR diabetic retinopathy; NPDR non-proliferative diabetic retinopathy; PDR proliferative diabetic retinopathy; CSME clinically significant macular edema; DME diabetic macular edema; dbh dot blot hemorrhages; CWS cotton wool spot; POAG primary open angle glaucoma; C/D cup-to-disc ratio; HVF humphrey visual field; GVF goldmann visual field; OCT optical coherence tomography; IOP intraocular pressure; BRVO Branch retinal vein occlusion; CRVO central retinal vein occlusion; CRAO central retinal artery occlusion; BRAO branch retinal artery occlusion; RT retinal tear; SB scleral buckle; PPV pars plana vitrectomy; VH Vitreous hemorrhage; PRP panretinal laser photocoagulation; IVK intravitreal kenalog; VMT vitreomacular traction; MH Macular hole;  NVD neovascularization of the disc; NVE neovascularization elsewhere; AREDS age related eye disease study; ARMD age related macular degeneration; POAG primary open angle glaucoma; EBMD epithelial/anterior basement membrane dystrophy; ACIOL anterior chamber intraocular lens; IOL intraocular lens; PCIOL posterior chamber intraocular lens; Phaco/IOL phacoemulsification with intraocular lens placement; [place] photorefractive keratectomy; LASIK laser assisted in situ keratomileusis; HTN hypertension; DM diabetes mellitus; COPD chronic obstructive pulmonary disease

## 2021-07-11 ENCOUNTER — Observation Stay (HOSPITAL_COMMUNITY)
Admission: EM | Admit: 2021-07-11 | Discharge: 2021-07-13 | Disposition: A | Discharge status: Home / Self Care | Payer: Medicare Other | Attending: Family Medicine | Admitting: Family Medicine

## 2021-07-11 DIAGNOSIS — J45909 Unspecified asthma, uncomplicated: Secondary | ICD-10-CM | POA: Insufficient documentation

## 2021-07-11 DIAGNOSIS — E1140 Type 2 diabetes mellitus with diabetic neuropathy, unspecified: Secondary | ICD-10-CM

## 2021-07-11 DIAGNOSIS — E876 Hypokalemia: Secondary | ICD-10-CM | POA: Diagnosis not present

## 2021-07-11 DIAGNOSIS — R7989 Other specified abnormal findings of blood chemistry: Secondary | ICD-10-CM | POA: Diagnosis present

## 2021-07-11 DIAGNOSIS — Z7984 Long term (current) use of oral hypoglycemic drugs: Secondary | ICD-10-CM | POA: Insufficient documentation

## 2021-07-11 DIAGNOSIS — I10 Essential (primary) hypertension: Secondary | ICD-10-CM | POA: Diagnosis present

## 2021-07-11 DIAGNOSIS — E1122 Type 2 diabetes mellitus with diabetic chronic kidney disease: Secondary | ICD-10-CM | POA: Diagnosis not present

## 2021-07-11 DIAGNOSIS — I129 Hypertensive chronic kidney disease with stage 1 through stage 4 chronic kidney disease, or unspecified chronic kidney disease: Secondary | ICD-10-CM | POA: Diagnosis not present

## 2021-07-11 DIAGNOSIS — Z79899 Other long term (current) drug therapy: Secondary | ICD-10-CM | POA: Insufficient documentation

## 2021-07-11 DIAGNOSIS — R410 Disorientation, unspecified: Secondary | ICD-10-CM | POA: Diagnosis not present

## 2021-07-11 DIAGNOSIS — N183 Chronic kidney disease, stage 3 unspecified: Secondary | ICD-10-CM

## 2021-07-11 DIAGNOSIS — I959 Hypotension, unspecified: Secondary | ICD-10-CM | POA: Diagnosis not present

## 2021-07-11 DIAGNOSIS — Z87891 Personal history of nicotine dependence: Secondary | ICD-10-CM | POA: Insufficient documentation

## 2021-07-11 DIAGNOSIS — N179 Acute kidney failure, unspecified: Secondary | ICD-10-CM | POA: Diagnosis not present

## 2021-07-11 DIAGNOSIS — R7303 Prediabetes: Secondary | ICD-10-CM

## 2021-07-11 DIAGNOSIS — J449 Chronic obstructive pulmonary disease, unspecified: Secondary | ICD-10-CM | POA: Diagnosis not present

## 2021-07-11 DIAGNOSIS — E162 Hypoglycemia, unspecified: Secondary | ICD-10-CM

## 2021-07-11 DIAGNOSIS — E11649 Type 2 diabetes mellitus with hypoglycemia without coma: Secondary | ICD-10-CM | POA: Diagnosis not present

## 2021-07-11 DIAGNOSIS — R531 Weakness: Secondary | ICD-10-CM | POA: Diagnosis not present

## 2021-07-11 DIAGNOSIS — N184 Chronic kidney disease, stage 4 (severe): Secondary | ICD-10-CM | POA: Diagnosis not present

## 2021-07-11 LAB — CBC WITH DIFFERENTIAL/PLATELET
Abs Immature Granulocytes: 0.03 10*3/uL (ref 0.00–0.07)
Basophils Absolute: 0.1 10*3/uL (ref 0.0–0.1)
Basophils Relative: 1 %
Eosinophils Absolute: 0.2 10*3/uL (ref 0.0–0.5)
Eosinophils Relative: 2 %
HCT: 35.5 % — ABNORMAL LOW (ref 36.0–46.0)
Hemoglobin: 10.8 g/dL — ABNORMAL LOW (ref 12.0–15.0)
Immature Granulocytes: 0 %
Lymphocytes Relative: 33 %
Lymphs Abs: 2.7 10*3/uL (ref 0.7–4.0)
MCH: 23.5 pg — ABNORMAL LOW (ref 26.0–34.0)
MCHC: 30.4 g/dL (ref 30.0–36.0)
MCV: 77.2 fL — ABNORMAL LOW (ref 80.0–100.0)
Monocytes Absolute: 0.6 10*3/uL (ref 0.1–1.0)
Monocytes Relative: 8 %
Neutro Abs: 4.6 10*3/uL (ref 1.7–7.7)
Neutrophils Relative %: 56 %
Platelets: 329 10*3/uL (ref 150–400)
RBC: 4.6 MIL/uL (ref 3.87–5.11)
RDW: 15.3 % (ref 11.5–15.5)
WBC: 8.2 10*3/uL (ref 4.0–10.5)
nRBC: 0 % (ref 0.0–0.2)

## 2021-07-11 LAB — I-STAT CHEM 8, ED
BUN: 34 mg/dL — ABNORMAL HIGH (ref 8–23)
Calcium, Ion: 1.06 mmol/L — ABNORMAL LOW (ref 1.15–1.40)
Chloride: 100 mmol/L (ref 98–111)
Creatinine, Ser: 2.6 mg/dL — ABNORMAL HIGH (ref 0.44–1.00)
Glucose, Bld: 73 mg/dL (ref 70–99)
HCT: 36 % (ref 36.0–46.0)
Hemoglobin: 12.2 g/dL (ref 12.0–15.0)
Potassium: 2.6 mmol/L — CL (ref 3.5–5.1)
Sodium: 137 mmol/L (ref 135–145)
TCO2: 26 mmol/L (ref 22–32)

## 2021-07-11 LAB — COMPREHENSIVE METABOLIC PANEL
ALT: 14 U/L (ref 0–44)
AST: 149 U/L — ABNORMAL HIGH (ref 15–41)
Albumin: 2.4 g/dL — ABNORMAL LOW (ref 3.5–5.0)
Alkaline Phosphatase: 47 U/L (ref 38–126)
Anion gap: 9 (ref 5–15)
BUN: 35 mg/dL — ABNORMAL HIGH (ref 8–23)
CO2: 24 mmol/L (ref 22–32)
Calcium: 7.6 mg/dL — ABNORMAL LOW (ref 8.9–10.3)
Chloride: 103 mmol/L (ref 98–111)
Creatinine, Ser: 2.41 mg/dL — ABNORMAL HIGH (ref 0.44–1.00)
GFR, Estimated: 18 mL/min — ABNORMAL LOW (ref >60)
Glucose, Bld: 77 mg/dL (ref 70–99)
Potassium: 2.7 mmol/L — CL (ref 3.5–5.1)
Sodium: 136 mmol/L (ref 135–145)
Total Bilirubin: 0.2 mg/dL — ABNORMAL LOW (ref 0.3–1.2)
Total Protein: 6.5 g/dL (ref 6.5–8.1)

## 2021-07-11 LAB — CBG MONITORING, ED
Glucose-Capillary: 61 mg/dL — ABNORMAL LOW (ref 70–99)
Glucose-Capillary: 89 mg/dL (ref 70–99)

## 2021-07-11 MED ORDER — SODIUM CHLORIDE 0.9 % IV SOLN: 250.0000 mL | INTRAVENOUS | Status: DC | PRN

## 2021-07-11 MED ORDER — SODIUM CHLORIDE 0.9% FLUSH
3.0000 mL | INTRAVENOUS | Status: DC | PRN
  Administered 2021-07-11: 3 mL via INTRAVENOUS

## 2021-07-11 MED ORDER — DEXTROSE 50 % IV SOLN
INTRAVENOUS | Status: AC
  Administered 2021-07-11: 50 mL
  Filled 2021-07-11: qty 50

## 2021-07-11 MED ORDER — SODIUM CHLORIDE 0.9% FLUSH
3.0000 mL | Freq: Two times a day (BID) | INTRAVENOUS | Status: DC
  Administered 2021-07-11 – 2021-07-12 (×2): 3 mL via INTRAVENOUS

## 2021-07-11 MED ORDER — DEXTROSE 10 % IV SOLN: INTRAVENOUS | Status: DC

## 2021-07-12 DIAGNOSIS — I10 Essential (primary) hypertension: Secondary | ICD-10-CM | POA: Diagnosis not present

## 2021-07-12 DIAGNOSIS — N179 Acute kidney failure, unspecified: Secondary | ICD-10-CM | POA: Diagnosis not present

## 2021-07-12 DIAGNOSIS — E876 Hypokalemia: Secondary | ICD-10-CM

## 2021-07-12 DIAGNOSIS — N184 Chronic kidney disease, stage 4 (severe): Secondary | ICD-10-CM

## 2021-07-12 DIAGNOSIS — E162 Hypoglycemia, unspecified: Secondary | ICD-10-CM | POA: Diagnosis not present

## 2021-07-12 DIAGNOSIS — N1832 Chronic kidney disease, stage 3b: Secondary | ICD-10-CM

## 2021-07-12 DIAGNOSIS — E11649 Type 2 diabetes mellitus with hypoglycemia without coma: Secondary | ICD-10-CM | POA: Diagnosis not present

## 2021-07-12 DIAGNOSIS — R7989 Other specified abnormal findings of blood chemistry: Secondary | ICD-10-CM

## 2021-07-12 LAB — COMPREHENSIVE METABOLIC PANEL
ALT: 14 U/L (ref 0–44)
AST: 133 U/L — ABNORMAL HIGH (ref 15–41)
Albumin: 2.2 g/dL — ABNORMAL LOW (ref 3.5–5.0)
Alkaline Phosphatase: 47 U/L (ref 38–126)
Anion gap: 8 (ref 5–15)
BUN: 32 mg/dL — ABNORMAL HIGH (ref 8–23)
CO2: 23 mmol/L (ref 22–32)
Calcium: 7.5 mg/dL — ABNORMAL LOW (ref 8.9–10.3)
Chloride: 103 mmol/L (ref 98–111)
Creatinine, Ser: 2.03 mg/dL — ABNORMAL HIGH (ref 0.44–1.00)
GFR, Estimated: 22 mL/min — ABNORMAL LOW (ref >60)
Glucose, Bld: 92 mg/dL (ref 70–99)
Potassium: 3.1 mmol/L — ABNORMAL LOW (ref 3.5–5.1)
Sodium: 134 mmol/L — ABNORMAL LOW (ref 135–145)
Total Bilirubin: 0.3 mg/dL (ref 0.3–1.2)
Total Protein: 6.2 g/dL — ABNORMAL LOW (ref 6.5–8.1)

## 2021-07-12 LAB — URINALYSIS, ROUTINE W REFLEX MICROSCOPIC
Bilirubin Urine: NEGATIVE
Glucose, UA: NEGATIVE mg/dL
Ketones, ur: NEGATIVE mg/dL
Nitrite: NEGATIVE
Protein, ur: NEGATIVE mg/dL
Specific Gravity, Urine: 1.009 (ref 1.005–1.030)
WBC, UA: >50 WBC/hpf — ABNORMAL HIGH (ref 0–5)
pH: 5 (ref 5.0–8.0)

## 2021-07-12 LAB — GLUCOSE, CAPILLARY
Glucose-Capillary: 94 mg/dL (ref 70–99)
Glucose-Capillary: 86 mg/dL (ref 70–99)
Glucose-Capillary: 130 mg/dL — ABNORMAL HIGH (ref 70–99)
Glucose-Capillary: 146 mg/dL — ABNORMAL HIGH (ref 70–99)
Glucose-Capillary: 150 mg/dL — ABNORMAL HIGH (ref 70–99)

## 2021-07-12 LAB — CBG MONITORING, ED
Glucose-Capillary: 132 mg/dL — ABNORMAL HIGH (ref 70–99)
Glucose-Capillary: 132 mg/dL — ABNORMAL HIGH (ref 70–99)
Glucose-Capillary: 139 mg/dL — ABNORMAL HIGH (ref 70–99)
Glucose-Capillary: 60 mg/dL — ABNORMAL LOW (ref 70–99)
Glucose-Capillary: 66 mg/dL — ABNORMAL LOW (ref 70–99)

## 2021-07-12 MED ORDER — ONDANSETRON HCL 4 MG PO TABS: 4.0000 mg | ORAL_TABLET | Freq: Four times a day (QID) | ORAL | Status: DC | PRN

## 2021-07-12 MED ORDER — POTASSIUM CHLORIDE 10 MEQ/100ML IV SOLN
10.0000 meq | INTRAVENOUS | Status: AC
  Administered 2021-07-12 (×4): 10 meq via INTRAVENOUS
  Filled 2021-07-12 (×3): qty 100

## 2021-07-12 MED ORDER — ACETAMINOPHEN 650 MG RE SUPP: 650.0000 mg | Freq: Four times a day (QID) | RECTAL | Status: DC | PRN

## 2021-07-12 MED ORDER — IPRATROPIUM-ALBUTEROL 0.5-2.5 (3) MG/3ML IN SOLN
3.0000 mL | RESPIRATORY_TRACT | Status: DC | PRN
  Administered 2021-07-12: 3 mL via RESPIRATORY_TRACT
  Filled 2021-07-12: qty 3

## 2021-07-12 MED ORDER — SODIUM CHLORIDE 0.9 % IV SOLN
1.0000 g | INTRAVENOUS | Status: DC
  Administered 2021-07-12: 1 g via INTRAVENOUS
  Filled 2021-07-12: qty 10

## 2021-07-12 MED ORDER — POTASSIUM CHLORIDE CRYS ER 20 MEQ PO TBCR
40.0000 meq | EXTENDED_RELEASE_TABLET | Freq: Once | ORAL | Status: AC
  Administered 2021-07-12: 40 meq via ORAL
  Filled 2021-07-12: qty 2

## 2021-07-12 MED ORDER — SODIUM CHLORIDE 0.9 % IV BOLUS: 1000.0000 mL | Freq: Once | INTRAVENOUS | Status: DC

## 2021-07-12 MED ORDER — ACETAMINOPHEN 325 MG PO TABS: 650.0000 mg | ORAL_TABLET | Freq: Four times a day (QID) | ORAL | Status: DC | PRN

## 2021-07-12 MED ORDER — ONDANSETRON HCL 4 MG/2ML IJ SOLN: 4.0000 mg | Freq: Four times a day (QID) | INTRAMUSCULAR | Status: DC | PRN

## 2021-07-12 MED ORDER — POTASSIUM CHLORIDE 10 MEQ/100ML IV SOLN
10.0000 meq | INTRAVENOUS | Status: DC
  Administered 2021-07-12: 10 meq via INTRAVENOUS
  Filled 2021-07-12: qty 100

## 2021-07-12 MED ORDER — HEPARIN SODIUM (PORCINE) 5000 UNIT/ML IJ SOLN
5000.0000 [IU] | Freq: Three times a day (TID) | INTRAMUSCULAR | Status: DC
Start: 2021-07-12 — End: 2021-07-13
  Administered 2021-07-12 – 2021-07-13 (×4): 5000 [IU] via SUBCUTANEOUS
  Filled 2021-07-12 (×5): qty 1

## 2021-07-12 MED ORDER — METOPROLOL SUCCINATE ER 100 MG PO TB24
100.0000 mg | ORAL_TABLET | Freq: Every day | ORAL | Status: DC
  Administered 2021-07-12 – 2021-07-13 (×2): 100 mg via ORAL
  Filled 2021-07-12 (×2): qty 1

## 2021-07-12 MED ORDER — SODIUM CHLORIDE 0.9 % IV BOLUS
500.0000 mL | Freq: Once | INTRAVENOUS | Status: AC
  Administered 2021-07-12: 500 mL via INTRAVENOUS

## 2021-07-12 MED ORDER — HYDRALAZINE HCL 50 MG PO TABS
50.0000 mg | ORAL_TABLET | Freq: Three times a day (TID) | ORAL | Status: DC
  Administered 2021-07-12 – 2021-07-13 (×4): 50 mg via ORAL
  Filled 2021-07-12 (×4): qty 1

## 2021-07-12 MED ORDER — DEXTROSE 50 % IV SOLN
25.0000 mL | INTRAVENOUS | Status: DC | PRN
Start: 2021-07-12 — End: 2021-07-13
  Administered 2021-07-12: 25 mL via INTRAVENOUS
  Filled 2021-07-12: qty 50

## 2021-07-12 NOTE — ED Notes (Signed)
CBG reads 66. MD notified. Crackers and juice given. New meds ordered. See MAR.

## 2021-07-12 NOTE — ED Notes (Signed)
ED TO INPATIENT HANDOFF REPORT  ED Nurse Name and Phone #: [phone] Fredricka Bonine RN  S Name/Age/Gender Rebecca Elliott 88 y.o. female Room/Bed: 020C/020C  Code Status   Code Status: Full Code  Home/SNF/Other Home Patient oriented to: self, place, time, and situation Is this baseline? Yes   Triage Complete: Triage complete  Chief Complaint Hypoglycemia [E16.2]  Triage Note Pt BIB [facility], family reported pt "wasn't being herself". Pt became diaphoretic, altered, cbg was 63. Pt reports not feeling well as a chief complaint, pt is a diabetic.   Pulse 60, bp 138/60, repeat cbg 125.   Allergies No Known Allergies  Level of Care/Admitting Diagnosis ED Disposition     ED Disposition  Admit   Condition  --   Comment  Hospital Area: [facility] [[id number]]  Level of Care: Telemetry Medical [[id number]]  May place patient in observation at [facility] or [facility] if equivalent level of care is available:: No  Covid Evaluation: Asymptomatic - no recent exposure (last 10 days) testing not required  Diagnosis: Hypoglycemia [644034]  Admitting Physician: Hillary Bow [[id number]]  Attending Physician: Hillary Bow [[id number]]          B Medical/Surgery History Past Medical History:  Diagnosis Date   Allergic rhinitis    COPD  and ASTHMA    Depression    Diabetes mellitus    as an adult   DJD (degenerative joint disease)    GERD (gastroesophageal reflux disease)    and HH w/ reflux   History of colonic polyps    Hyperlipidemia    Hypertension    Past Surgical History:  Procedure Laterality Date   ABDOMINAL HYSTERECTOMY     apparently no oophorectomy   APPENDECTOMY     bilateral breast tumor     BREAST EXCISIONAL BIOPSY Bilateral    one in her 20's and one in her 12's, both benign   CATARACT EXTRACTION Bilateral    Groat   CATARACT EXTRACTION, BILATERAL     CHOLECYSTECTOMY     TONSILLECTOMY     TUBAL LIGATION       A IV Location/Drains/Wounds Patient  Lines/Drains/Airways Status     Active Line/Drains/Airways     Name Placement date Placement time Site Days   Peripheral IV 11/09/20 22 G 1" Right Hand 11/09/20  2348  Hand  245   Peripheral IV 07/11/21 18 G Left Antecubital 07/11/21  2257  Antecubital  1   Peripheral IV 07/11/21 18 G Anterior;Proximal;Right Forearm 07/11/21  2328  Forearm  1            Intake/Output Last 24 hours No intake or output data in the 24 hours ending 07/12/21 0238  Labs/Imaging Results for orders placed or performed during the hospital encounter of 07/11/21 (from the past 48 hour(s))  CBG monitoring, ED     Status: None   Collection Time: 07/11/21 10:48 PM  Result Value Ref Range   Glucose-Capillary 89 70 - 99 mg/dL    Comment: Glucose reference range applies only to samples taken after fasting for at least 8 hours.  CBC with Differential     Status: Abnormal   Collection Time: 07/11/21 10:55 PM  Result Value Ref Range   WBC 8.2 4.0 - 10.5 K/uL   RBC 4.60 3.87 - 5.11 MIL/uL   Hemoglobin 10.8 (L) 12.0 - 15.0 g/dL   HCT 95.6 (L) 38.7 - 56.4 %   MCV 77.2 (L) 80.0 - 100.0 fL  MCH 23.5 (L) 26.0 - 34.0 pg   MCHC 30.4 30.0 - 36.0 g/dL   RDW 16.1 09.6 - 04.5 %   Platelets 329 150 - 400 K/uL   nRBC 0.0 0.0 - 0.2 %   Neutrophils Relative % 56 %   Neutro Abs 4.6 1.7 - 7.7 K/uL   Lymphocytes Relative 33 %   Lymphs Abs 2.7 0.7 - 4.0 K/uL   Monocytes Relative 8 %   Monocytes Absolute 0.6 0.1 - 1.0 K/uL   Eosinophils Relative 2 %   Eosinophils Absolute 0.2 0.0 - 0.5 K/uL   Basophils Relative 1 %   Basophils Absolute 0.1 0.0 - 0.1 K/uL   Immature Granulocytes 0 %   Abs Immature Granulocytes 0.03 0.00 - 0.07 K/uL    Comment: Performed at [facility], [address] [place], [place] [address]  Comprehensive metabolic panel     Status: Abnormal   Collection Time: 07/11/21 10:55 PM  Result Value Ref Range   Sodium 136 135 - 145 mmol/L   Potassium 2.7 (LL) 3.5 - 5.1 mmol/L    Comment: CRITICAL  RESULT CALLED TO, READ BACK BY AND VERIFIED WITH: C.Nyelle Wolfson, RN [phone] 06.24.23 M.RIVET    Chloride 103 98 - 111 mmol/L   CO2 24 22 - 32 mmol/L   Glucose, Bld 77 70 - 99 mg/dL    Comment: Glucose reference range applies only to samples taken after fasting for at least 8 hours.   BUN 35 (H) 8 - 23 mg/dL   Creatinine, Ser 7.82 (H) 0.44 - 1.00 mg/dL   Calcium 7.6 (L) 8.9 - 10.3 mg/dL   Total Protein 6.5 6.5 - 8.1 g/dL   Albumin 2.4 (L) 3.5 - 5.0 g/dL   AST 956 (H) 15 - 41 U/L   ALT 14 0 - 44 U/L   Alkaline Phosphatase 47 38 - 126 U/L   Total Bilirubin 0.2 (L) 0.3 - 1.2 mg/dL   GFR, Estimated 18 (L) >60 mL/min    Comment: (NOTE) Calculated using the CKD-EPI Creatinine Equation (2021)    Anion gap 9 5 - 15    Comment: Performed at [facility], [address], [place], [place] [address]  I-Stat Chem 8, ED ([facility], [facility], [facility] only)     Status: Abnormal   Collection Time: 07/11/21 11:17 PM  Result Value Ref Range   Sodium 137 135 - 145 mmol/L   Potassium 2.6 (LL) 3.5 - 5.1 mmol/L   Chloride 100 98 - 111 mmol/L   BUN 34 (H) 8 - 23 mg/dL   Creatinine, Ser 6.57 (H) 0.44 - 1.00 mg/dL   Glucose, Bld 73 70 - 99 mg/dL    Comment: Glucose reference range applies only to samples taken after fasting for at least 8 hours.   Calcium, Ion 1.06 (L) 1.15 - 1.40 mmol/L   TCO2 26 22 - 32 mmol/L   Hemoglobin 12.2 12.0 - 15.0 g/dL   HCT 84.6 96.2 - 95.2 %   Comment NOTIFIED PHYSICIAN   CBG monitoring, ED (now and then every hour for 3 hours)     Status: Abnormal   Collection Time: 07/11/21 11:23 PM  Result Value Ref Range   Glucose-Capillary 61 (L) 70 - 99 mg/dL    Comment: Glucose reference range applies only to samples taken after fasting for at least 8 hours.  CBG monitoring, ED (now and then every hour for 3 hours)     Status: Abnormal   Collection Time: 07/12/21  12:08 AM  Result Value Ref Range   Glucose-Capillary 132 (H) 70 - 99 mg/dL    Comment: Glucose reference range applies only to samples  taken after fasting for at least 8 hours.  CBG monitoring, ED (now and then every hour for 3 hours)     Status: Abnormal   Collection Time: 07/12/21  1:34 AM  Result Value Ref Range   Glucose-Capillary 66 (L) 70 - 99 mg/dL    Comment: Glucose reference range applies only to samples taken after fasting for at least 8 hours.  CBG monitoring, ED     Status: Abnormal   Collection Time: 07/12/21  1:36 AM  Result Value Ref Range   Glucose-Capillary 60 (L) 70 - 99 mg/dL    Comment: Glucose reference range applies only to samples taken after fasting for at least 8 hours.  CBG monitoring, ED     Status: Abnormal   Collection Time: 07/12/21  1:55 AM  Result Value Ref Range   Glucose-Capillary 139 (H) 70 - 99 mg/dL    Comment: Glucose reference range applies only to samples taken after fasting for at least 8 hours.   No results found.  Pending Labs Unresulted Labs (From admission, onward)     Start     Ordered   07/12/21 0500  Comprehensive metabolic panel  Tomorrow morning,   R        07/12/21 0153   07/11/21 2312  Urinalysis, Routine w reflex microscopic  Once,   URGENT        07/11/21 2312            Vitals/Pain Today's Vitals   07/11/21 2308 07/11/21 2315 07/11/21 2345 07/12/21 0012  BP:  (!) 166/53    Pulse:  60    Resp:  (!) 23    Temp:   97.6 F (36.4 C)   TempSrc:   Oral   SpO2:  96%    PainSc: 0-No pain   0-No pain    Isolation Precautions No active isolations  Medications Medications  sodium chloride flush (NS) 0.9 % injection 3 mL (3 mLs Intravenous Given 07/11/21 2333)  sodium chloride flush (NS) 0.9 % injection 3 mL (3 mLs Intravenous Given 07/11/21 2333)  0.9 %  sodium chloride infusion (has no administration in time range)  dextrose 10 % infusion ( Intravenous Rate/Dose Change 07/12/21 0152)  sodium chloride 0.9 % bolus 500 mL (has no administration in time range)  dextrose 50 % solution 25 mL (25 mLs Intravenous Given 07/12/21 0141)  potassium chloride 10  mEq in 100 mL IVPB (has no administration in time range)  potassium chloride SA (KLOR-CON M) CR tablet 40 mEq (has no administration in time range)  heparin injection 5,000 Units (has no administration in time range)  acetaminophen (TYLENOL) tablet 650 mg (has no administration in time range)    Or  acetaminophen (TYLENOL) suppository 650 mg (has no administration in time range)  ondansetron (ZOFRAN) tablet 4 mg (has no administration in time range)    Or  ondansetron (ZOFRAN) injection 4 mg (has no administration in time range)  hydrALAZINE (APRESOLINE) tablet 50 mg (has no administration in time range)  metoprolol succinate (TOPROL-XL) 24 hr tablet 100 mg (has no administration in time range)  dextrose 50 % solution (50 mLs  Given 07/11/21 2331)    Mobility walks Low fall risk   Focused Assessments Neuro Assessment Handoff:  Swallow screen pass? Yes  Neuro Assessment: Within Defined Limits Neuro Checks:      Last Documented NIHSS Modified Score:   Has TPA been given? No If patient is a Neuro Trauma and patient is going to OR before floor call report to 4N Charge nurse: [phone] or [phone]   R Recommendations: See Admitting Provider Note  Report given to:   Additional Notes:

## 2021-07-12 NOTE — Assessment & Plan Note (Addendum)
--   On the borderline of CKD stage IV.  Followed by PCP.  Could consider outpatient nephrology referral. --Renal function improved with fluids.  Will monitor and repeat BMP in AM.

## 2021-07-12 NOTE — Assessment & Plan Note (Addendum)
--  Stable.  Holding Diovan and furosemide in setting of renal insufficiency.

## 2021-07-13 DIAGNOSIS — Z87891 Personal history of nicotine dependence: Secondary | ICD-10-CM | POA: Diagnosis not present

## 2021-07-13 DIAGNOSIS — E669 Obesity, unspecified: Secondary | ICD-10-CM | POA: Diagnosis present

## 2021-07-13 DIAGNOSIS — J189 Pneumonia, unspecified organism: Secondary | ICD-10-CM | POA: Diagnosis not present

## 2021-07-13 DIAGNOSIS — J411 Mucopurulent chronic bronchitis: Secondary | ICD-10-CM | POA: Diagnosis not present

## 2021-07-13 DIAGNOSIS — Z833 Family history of diabetes mellitus: Secondary | ICD-10-CM | POA: Diagnosis not present

## 2021-07-13 DIAGNOSIS — I130 Hypertensive heart and chronic kidney disease with heart failure and stage 1 through stage 4 chronic kidney disease, or unspecified chronic kidney disease: Secondary | ICD-10-CM | POA: Diagnosis not present

## 2021-07-13 DIAGNOSIS — Z20822 Contact with and (suspected) exposure to covid-19: Secondary | ICD-10-CM | POA: Diagnosis present

## 2021-07-13 DIAGNOSIS — J449 Chronic obstructive pulmonary disease, unspecified: Secondary | ICD-10-CM | POA: Diagnosis not present

## 2021-07-13 DIAGNOSIS — Z8249 Family history of ischemic heart disease and other diseases of the circulatory system: Secondary | ICD-10-CM | POA: Diagnosis not present

## 2021-07-13 DIAGNOSIS — R0902 Hypoxemia: Secondary | ICD-10-CM | POA: Diagnosis not present

## 2021-07-13 DIAGNOSIS — R918 Other nonspecific abnormal finding of lung field: Secondary | ICD-10-CM | POA: Diagnosis not present

## 2021-07-13 DIAGNOSIS — Z9981 Dependence on supplemental oxygen: Secondary | ICD-10-CM | POA: Diagnosis not present

## 2021-07-13 DIAGNOSIS — E1140 Type 2 diabetes mellitus with diabetic neuropathy, unspecified: Secondary | ICD-10-CM | POA: Diagnosis not present

## 2021-07-13 DIAGNOSIS — E785 Hyperlipidemia, unspecified: Secondary | ICD-10-CM | POA: Diagnosis present

## 2021-07-13 DIAGNOSIS — Z515 Encounter for palliative care: Secondary | ICD-10-CM | POA: Diagnosis not present

## 2021-07-13 DIAGNOSIS — Z87440 Personal history of urinary (tract) infections: Secondary | ICD-10-CM | POA: Diagnosis not present

## 2021-07-13 DIAGNOSIS — R251 Tremor, unspecified: Secondary | ICD-10-CM | POA: Diagnosis not present

## 2021-07-13 DIAGNOSIS — E162 Hypoglycemia, unspecified: Secondary | ICD-10-CM | POA: Diagnosis not present

## 2021-07-13 DIAGNOSIS — M1990 Unspecified osteoarthritis, unspecified site: Secondary | ICD-10-CM | POA: Diagnosis not present

## 2021-07-13 DIAGNOSIS — I248 Other forms of acute ischemic heart disease: Secondary | ICD-10-CM | POA: Diagnosis present

## 2021-07-13 DIAGNOSIS — R7303 Prediabetes: Secondary | ICD-10-CM

## 2021-07-13 DIAGNOSIS — M170 Bilateral primary osteoarthritis of knee: Secondary | ICD-10-CM | POA: Diagnosis present

## 2021-07-13 DIAGNOSIS — Z66 Do not resuscitate: Secondary | ICD-10-CM | POA: Diagnosis not present

## 2021-07-13 DIAGNOSIS — I5033 Acute on chronic diastolic (congestive) heart failure: Secondary | ICD-10-CM | POA: Diagnosis present

## 2021-07-13 DIAGNOSIS — N179 Acute kidney failure, unspecified: Secondary | ICD-10-CM | POA: Diagnosis not present

## 2021-07-13 DIAGNOSIS — N1832 Chronic kidney disease, stage 3b: Secondary | ICD-10-CM | POA: Diagnosis not present

## 2021-07-13 DIAGNOSIS — I5042 Chronic combined systolic (congestive) and diastolic (congestive) heart failure: Secondary | ICD-10-CM | POA: Diagnosis not present

## 2021-07-13 DIAGNOSIS — Z711 Person with feared health complaint in whom no diagnosis is made: Secondary | ICD-10-CM | POA: Diagnosis not present

## 2021-07-13 DIAGNOSIS — I10 Essential (primary) hypertension: Secondary | ICD-10-CM | POA: Diagnosis not present

## 2021-07-13 DIAGNOSIS — J9621 Acute and chronic respiratory failure with hypoxia: Secondary | ICD-10-CM | POA: Diagnosis present

## 2021-07-13 DIAGNOSIS — R638 Other symptoms and signs concerning food and fluid intake: Secondary | ICD-10-CM | POA: Diagnosis not present

## 2021-07-13 DIAGNOSIS — Z743 Need for continuous supervision: Secondary | ICD-10-CM | POA: Diagnosis not present

## 2021-07-13 DIAGNOSIS — E1122 Type 2 diabetes mellitus with diabetic chronic kidney disease: Secondary | ICD-10-CM | POA: Diagnosis present

## 2021-07-13 DIAGNOSIS — Z789 Other specified health status: Secondary | ICD-10-CM | POA: Diagnosis not present

## 2021-07-13 DIAGNOSIS — Z8701 Personal history of pneumonia (recurrent): Secondary | ICD-10-CM | POA: Diagnosis not present

## 2021-07-13 DIAGNOSIS — R0602 Shortness of breath: Secondary | ICD-10-CM | POA: Diagnosis not present

## 2021-07-13 DIAGNOSIS — K219 Gastro-esophageal reflux disease without esophagitis: Secondary | ICD-10-CM | POA: Diagnosis present

## 2021-07-13 DIAGNOSIS — J90 Pleural effusion, not elsewhere classified: Secondary | ICD-10-CM | POA: Diagnosis not present

## 2021-07-13 DIAGNOSIS — R590 Localized enlarged lymph nodes: Secondary | ICD-10-CM | POA: Diagnosis not present

## 2021-07-13 DIAGNOSIS — L89322 Pressure ulcer of left buttock, stage 2: Secondary | ICD-10-CM | POA: Diagnosis not present

## 2021-07-13 DIAGNOSIS — R059 Cough, unspecified: Secondary | ICD-10-CM | POA: Diagnosis not present

## 2021-07-13 DIAGNOSIS — J690 Pneumonitis due to inhalation of food and vomit: Secondary | ICD-10-CM | POA: Diagnosis not present

## 2021-07-13 DIAGNOSIS — F32A Depression, unspecified: Secondary | ICD-10-CM | POA: Diagnosis not present

## 2021-07-13 DIAGNOSIS — E876 Hypokalemia: Secondary | ICD-10-CM | POA: Diagnosis not present

## 2021-07-13 DIAGNOSIS — Z6832 Body mass index (BMI) 32.0-32.9, adult: Secondary | ICD-10-CM | POA: Diagnosis not present

## 2021-07-13 DIAGNOSIS — J9600 Acute respiratory failure, unspecified whether with hypoxia or hypercapnia: Secondary | ICD-10-CM | POA: Diagnosis not present

## 2021-07-13 DIAGNOSIS — H3530 Unspecified macular degeneration: Secondary | ICD-10-CM | POA: Diagnosis not present

## 2021-07-13 DIAGNOSIS — R1310 Dysphagia, unspecified: Secondary | ICD-10-CM | POA: Diagnosis not present

## 2021-07-13 DIAGNOSIS — I110 Hypertensive heart disease with heart failure: Secondary | ICD-10-CM | POA: Diagnosis not present

## 2021-07-13 DIAGNOSIS — R609 Edema, unspecified: Secondary | ICD-10-CM | POA: Diagnosis not present

## 2021-07-13 DIAGNOSIS — K449 Diaphragmatic hernia without obstruction or gangrene: Secondary | ICD-10-CM | POA: Diagnosis not present

## 2021-07-13 DIAGNOSIS — Z7189 Other specified counseling: Secondary | ICD-10-CM | POA: Diagnosis not present

## 2021-07-13 DIAGNOSIS — J309 Allergic rhinitis, unspecified: Secondary | ICD-10-CM | POA: Diagnosis not present

## 2021-07-13 DIAGNOSIS — E161 Other hypoglycemia: Secondary | ICD-10-CM | POA: Diagnosis not present

## 2021-07-13 DIAGNOSIS — I5031 Acute diastolic (congestive) heart failure: Secondary | ICD-10-CM | POA: Diagnosis not present

## 2021-07-13 DIAGNOSIS — I509 Heart failure, unspecified: Secondary | ICD-10-CM | POA: Diagnosis not present

## 2021-07-13 DIAGNOSIS — I2510 Atherosclerotic heart disease of native coronary artery without angina pectoris: Secondary | ICD-10-CM | POA: Diagnosis not present

## 2021-07-13 LAB — BASIC METABOLIC PANEL
Anion gap: 4 — ABNORMAL LOW (ref 5–15)
BUN: 22 mg/dL (ref 8–23)
CO2: 22 mmol/L (ref 22–32)
Calcium: 7.8 mg/dL — ABNORMAL LOW (ref 8.9–10.3)
Chloride: 109 mmol/L (ref 98–111)
Creatinine, Ser: 1.47 mg/dL — ABNORMAL HIGH (ref 0.44–1.00)
GFR, Estimated: 33 mL/min — ABNORMAL LOW (ref >60)
Glucose, Bld: 101 mg/dL — ABNORMAL HIGH (ref 70–99)
Potassium: 3.9 mmol/L (ref 3.5–5.1)
Sodium: 135 mmol/L (ref 135–145)

## 2021-07-13 LAB — GLUCOSE, CAPILLARY
Glucose-Capillary: 104 mg/dL — ABNORMAL HIGH (ref 70–99)
Glucose-Capillary: 110 mg/dL — ABNORMAL HIGH (ref 70–99)
Glucose-Capillary: 120 mg/dL — ABNORMAL HIGH (ref 70–99)

## 2021-07-13 LAB — MAGNESIUM: Magnesium: 2 mg/dL (ref 1.7–2.4)

## 2021-07-13 MED ORDER — PANTOPRAZOLE SODIUM 40 MG PO TBEC: 40.0000 mg | DELAYED_RELEASE_TABLET | Freq: Every day | ORAL | Status: AC

## 2021-07-13 MED ORDER — FUROSEMIDE 40 MG PO TABS: ORAL_TABLET | ORAL | Status: DC

## 2021-07-14 ENCOUNTER — Other Ambulatory Visit: Payer: Self-pay

## 2021-07-14 LAB — URINE CULTURE: Culture: >=100000 — AB

## 2021-07-14 MED ORDER — CEFUROXIME AXETIL 500 MG PO TABS
500.0000 mg | ORAL_TABLET | Freq: Two times a day (BID) | ORAL | 0 refills | Status: DC
Start: 2021-07-14 — End: 2021-07-27

## 2021-07-16 ENCOUNTER — Inpatient Hospital Stay (HOSPITAL_COMMUNITY)
Admission: EM | Admit: 2021-07-16 | Discharge: 2021-07-27 | DRG: 177 | Disposition: A | Discharge status: Hospice | Payer: Medicare Other | Attending: Internal Medicine | Admitting: Internal Medicine

## 2021-07-16 ENCOUNTER — Encounter (HOSPITAL_COMMUNITY): Payer: Self-pay | Admitting: Internal Medicine

## 2021-07-16 ENCOUNTER — Emergency Department (HOSPITAL_COMMUNITY): Payer: Medicare Other

## 2021-07-16 ENCOUNTER — Other Ambulatory Visit: Payer: Self-pay

## 2021-07-16 DIAGNOSIS — R0602 Shortness of breath: Secondary | ICD-10-CM | POA: Diagnosis not present

## 2021-07-16 DIAGNOSIS — N179 Acute kidney failure, unspecified: Secondary | ICD-10-CM | POA: Diagnosis present

## 2021-07-16 DIAGNOSIS — Z789 Other specified health status: Secondary | ICD-10-CM | POA: Diagnosis not present

## 2021-07-16 DIAGNOSIS — R638 Other symptoms and signs concerning food and fluid intake: Secondary | ICD-10-CM | POA: Diagnosis not present

## 2021-07-16 DIAGNOSIS — Z7984 Long term (current) use of oral hypoglycemic drugs: Secondary | ICD-10-CM

## 2021-07-16 DIAGNOSIS — I110 Hypertensive heart disease with heart failure: Secondary | ICD-10-CM | POA: Diagnosis not present

## 2021-07-16 DIAGNOSIS — J411 Mucopurulent chronic bronchitis: Secondary | ICD-10-CM | POA: Diagnosis not present

## 2021-07-16 DIAGNOSIS — Z7189 Other specified counseling: Secondary | ICD-10-CM | POA: Diagnosis not present

## 2021-07-16 DIAGNOSIS — L89322 Pressure ulcer of left buttock, stage 2: Secondary | ICD-10-CM | POA: Diagnosis not present

## 2021-07-16 DIAGNOSIS — J189 Pneumonia, unspecified organism: Secondary | ICD-10-CM | POA: Diagnosis present

## 2021-07-16 DIAGNOSIS — N1832 Chronic kidney disease, stage 3b: Secondary | ICD-10-CM | POA: Diagnosis present

## 2021-07-16 DIAGNOSIS — E876 Hypokalemia: Secondary | ICD-10-CM | POA: Diagnosis present

## 2021-07-16 DIAGNOSIS — J90 Pleural effusion, not elsewhere classified: Secondary | ICD-10-CM | POA: Diagnosis not present

## 2021-07-16 DIAGNOSIS — H3530 Unspecified macular degeneration: Secondary | ICD-10-CM | POA: Diagnosis not present

## 2021-07-16 DIAGNOSIS — R350 Frequency of micturition: Secondary | ICD-10-CM | POA: Diagnosis present

## 2021-07-16 DIAGNOSIS — R251 Tremor, unspecified: Secondary | ICD-10-CM | POA: Diagnosis not present

## 2021-07-16 DIAGNOSIS — I10 Essential (primary) hypertension: Secondary | ICD-10-CM | POA: Diagnosis not present

## 2021-07-16 DIAGNOSIS — E669 Obesity, unspecified: Secondary | ICD-10-CM | POA: Diagnosis present

## 2021-07-16 DIAGNOSIS — I509 Heart failure, unspecified: Secondary | ICD-10-CM | POA: Diagnosis not present

## 2021-07-16 DIAGNOSIS — J690 Pneumonitis due to inhalation of food and vomit: Secondary | ICD-10-CM | POA: Diagnosis present

## 2021-07-16 DIAGNOSIS — M170 Bilateral primary osteoarthritis of knee: Secondary | ICD-10-CM | POA: Diagnosis present

## 2021-07-16 DIAGNOSIS — I5031 Acute diastolic (congestive) heart failure: Secondary | ICD-10-CM | POA: Diagnosis not present

## 2021-07-16 DIAGNOSIS — I130 Hypertensive heart and chronic kidney disease with heart failure and stage 1 through stage 4 chronic kidney disease, or unspecified chronic kidney disease: Secondary | ICD-10-CM | POA: Diagnosis present

## 2021-07-16 DIAGNOSIS — R609 Edema, unspecified: Secondary | ICD-10-CM | POA: Diagnosis not present

## 2021-07-16 DIAGNOSIS — Z515 Encounter for palliative care: Secondary | ICD-10-CM | POA: Diagnosis not present

## 2021-07-16 DIAGNOSIS — R059 Cough, unspecified: Secondary | ICD-10-CM | POA: Diagnosis not present

## 2021-07-16 DIAGNOSIS — Z79899 Other long term (current) drug therapy: Secondary | ICD-10-CM

## 2021-07-16 DIAGNOSIS — I2510 Atherosclerotic heart disease of native coronary artery without angina pectoris: Secondary | ICD-10-CM | POA: Diagnosis not present

## 2021-07-16 DIAGNOSIS — E1122 Type 2 diabetes mellitus with diabetic chronic kidney disease: Secondary | ICD-10-CM | POA: Diagnosis present

## 2021-07-16 DIAGNOSIS — Z6832 Body mass index (BMI) 32.0-32.9, adult: Secondary | ICD-10-CM

## 2021-07-16 DIAGNOSIS — N183 Chronic kidney disease, stage 3 unspecified: Secondary | ICD-10-CM | POA: Diagnosis present

## 2021-07-16 DIAGNOSIS — Z20822 Contact with and (suspected) exposure to covid-19: Secondary | ICD-10-CM | POA: Diagnosis present

## 2021-07-16 DIAGNOSIS — I5042 Chronic combined systolic (congestive) and diastolic (congestive) heart failure: Secondary | ICD-10-CM | POA: Diagnosis not present

## 2021-07-16 DIAGNOSIS — R0902 Hypoxemia: Secondary | ICD-10-CM | POA: Diagnosis not present

## 2021-07-16 DIAGNOSIS — Z9981 Dependence on supplemental oxygen: Secondary | ICD-10-CM | POA: Diagnosis not present

## 2021-07-16 DIAGNOSIS — K219 Gastro-esophageal reflux disease without esophagitis: Secondary | ICD-10-CM | POA: Diagnosis present

## 2021-07-16 DIAGNOSIS — F32A Depression, unspecified: Secondary | ICD-10-CM | POA: Diagnosis not present

## 2021-07-16 DIAGNOSIS — M1990 Unspecified osteoarthritis, unspecified site: Secondary | ICD-10-CM | POA: Diagnosis not present

## 2021-07-16 DIAGNOSIS — I5033 Acute on chronic diastolic (congestive) heart failure: Secondary | ICD-10-CM | POA: Diagnosis present

## 2021-07-16 DIAGNOSIS — Z87440 Personal history of urinary (tract) infections: Secondary | ICD-10-CM | POA: Diagnosis not present

## 2021-07-16 DIAGNOSIS — R1310 Dysphagia, unspecified: Secondary | ICD-10-CM | POA: Diagnosis not present

## 2021-07-16 DIAGNOSIS — Z87891 Personal history of nicotine dependence: Secondary | ICD-10-CM

## 2021-07-16 DIAGNOSIS — Z8701 Personal history of pneumonia (recurrent): Secondary | ICD-10-CM | POA: Diagnosis not present

## 2021-07-16 DIAGNOSIS — G4700 Insomnia, unspecified: Secondary | ICD-10-CM | POA: Diagnosis present

## 2021-07-16 DIAGNOSIS — R918 Other nonspecific abnormal finding of lung field: Secondary | ICD-10-CM | POA: Diagnosis not present

## 2021-07-16 DIAGNOSIS — K449 Diaphragmatic hernia without obstruction or gangrene: Secondary | ICD-10-CM | POA: Diagnosis not present

## 2021-07-16 DIAGNOSIS — J449 Chronic obstructive pulmonary disease, unspecified: Secondary | ICD-10-CM | POA: Diagnosis present

## 2021-07-16 DIAGNOSIS — J9621 Acute and chronic respiratory failure with hypoxia: Secondary | ICD-10-CM | POA: Diagnosis present

## 2021-07-16 DIAGNOSIS — I248 Other forms of acute ischemic heart disease: Secondary | ICD-10-CM | POA: Diagnosis present

## 2021-07-16 DIAGNOSIS — Z8249 Family history of ischemic heart disease and other diseases of the circulatory system: Secondary | ICD-10-CM | POA: Diagnosis not present

## 2021-07-16 DIAGNOSIS — E161 Other hypoglycemia: Secondary | ICD-10-CM | POA: Diagnosis not present

## 2021-07-16 DIAGNOSIS — R54 Age-related physical debility: Secondary | ICD-10-CM | POA: Diagnosis present

## 2021-07-16 DIAGNOSIS — Z66 Do not resuscitate: Secondary | ICD-10-CM | POA: Diagnosis present

## 2021-07-16 DIAGNOSIS — R590 Localized enlarged lymph nodes: Secondary | ICD-10-CM | POA: Diagnosis not present

## 2021-07-16 DIAGNOSIS — Z833 Family history of diabetes mellitus: Secondary | ICD-10-CM | POA: Diagnosis not present

## 2021-07-16 DIAGNOSIS — E1140 Type 2 diabetes mellitus with diabetic neuropathy, unspecified: Secondary | ICD-10-CM | POA: Diagnosis present

## 2021-07-16 DIAGNOSIS — J309 Allergic rhinitis, unspecified: Secondary | ICD-10-CM | POA: Diagnosis not present

## 2021-07-16 DIAGNOSIS — F419 Anxiety disorder, unspecified: Secondary | ICD-10-CM | POA: Diagnosis present

## 2021-07-16 DIAGNOSIS — E11649 Type 2 diabetes mellitus with hypoglycemia without coma: Secondary | ICD-10-CM | POA: Diagnosis not present

## 2021-07-16 DIAGNOSIS — J9600 Acute respiratory failure, unspecified whether with hypoxia or hypercapnia: Secondary | ICD-10-CM | POA: Diagnosis not present

## 2021-07-16 DIAGNOSIS — E162 Hypoglycemia, unspecified: Secondary | ICD-10-CM | POA: Diagnosis not present

## 2021-07-16 DIAGNOSIS — E785 Hyperlipidemia, unspecified: Secondary | ICD-10-CM | POA: Diagnosis present

## 2021-07-16 DIAGNOSIS — Z743 Need for continuous supervision: Secondary | ICD-10-CM | POA: Diagnosis not present

## 2021-07-16 DIAGNOSIS — Z711 Person with feared health complaint in whom no diagnosis is made: Secondary | ICD-10-CM | POA: Diagnosis not present

## 2021-07-16 LAB — CBC
HCT: 31.3 % — ABNORMAL LOW (ref 36.0–46.0)
Hemoglobin: 9.4 g/dL — ABNORMAL LOW (ref 12.0–15.0)
MCH: 23.1 pg — ABNORMAL LOW (ref 26.0–34.0)
MCHC: 30 g/dL (ref 30.0–36.0)
MCV: 76.9 fL — ABNORMAL LOW (ref 80.0–100.0)
Platelets: 303 10*3/uL (ref 150–400)
RBC: 4.07 MIL/uL (ref 3.87–5.11)
RDW: 15.8 % — ABNORMAL HIGH (ref 11.5–15.5)
WBC: 23.9 10*3/uL — ABNORMAL HIGH (ref 4.0–10.5)
nRBC: 0 % (ref 0.0–0.2)

## 2021-07-16 LAB — CBC WITH DIFFERENTIAL/PLATELET
Abs Immature Granulocytes: 0.19 10*3/uL — ABNORMAL HIGH (ref 0.00–0.07)
Basophils Absolute: 0.1 10*3/uL (ref 0.0–0.1)
Basophils Relative: 0 %
Eosinophils Absolute: 0 10*3/uL (ref 0.0–0.5)
Eosinophils Relative: 0 %
HCT: 31.5 % — ABNORMAL LOW (ref 36.0–46.0)
Hemoglobin: 9.9 g/dL — ABNORMAL LOW (ref 12.0–15.0)
Immature Granulocytes: 1 %
Lymphocytes Relative: 9 %
Lymphs Abs: 1.9 10*3/uL (ref 0.7–4.0)
MCH: 23.6 pg — ABNORMAL LOW (ref 26.0–34.0)
MCHC: 31.4 g/dL (ref 30.0–36.0)
MCV: 75 fL — ABNORMAL LOW (ref 80.0–100.0)
Monocytes Absolute: 1.8 10*3/uL — ABNORMAL HIGH (ref 0.1–1.0)
Monocytes Relative: 8 %
Neutro Abs: 17.6 10*3/uL — ABNORMAL HIGH (ref 1.7–7.7)
Neutrophils Relative %: 82 %
Platelets: 320 10*3/uL (ref 150–400)
RBC: 4.2 MIL/uL (ref 3.87–5.11)
RDW: 15.8 % — ABNORMAL HIGH (ref 11.5–15.5)
WBC: 21.6 10*3/uL — ABNORMAL HIGH (ref 4.0–10.5)
nRBC: 0 % (ref 0.0–0.2)

## 2021-07-16 LAB — COMPREHENSIVE METABOLIC PANEL
ALT: 14 U/L (ref 0–44)
AST: 85 U/L — ABNORMAL HIGH (ref 15–41)
Albumin: 2.4 g/dL — ABNORMAL LOW (ref 3.5–5.0)
Alkaline Phosphatase: 50 U/L (ref 38–126)
Anion gap: 8 (ref 5–15)
BUN: 19 mg/dL (ref 8–23)
CO2: 23 mmol/L (ref 22–32)
Calcium: 8.5 mg/dL — ABNORMAL LOW (ref 8.9–10.3)
Chloride: 102 mmol/L (ref 98–111)
Creatinine, Ser: 1.66 mg/dL — ABNORMAL HIGH (ref 0.44–1.00)
GFR, Estimated: 29 mL/min — ABNORMAL LOW (ref >60)
Glucose, Bld: 114 mg/dL — ABNORMAL HIGH (ref 70–99)
Potassium: 4.5 mmol/L (ref 3.5–5.1)
Sodium: 133 mmol/L — ABNORMAL LOW (ref 135–145)
Total Bilirubin: 0.4 mg/dL (ref 0.3–1.2)
Total Protein: 6.8 g/dL (ref 6.5–8.1)

## 2021-07-16 LAB — BRAIN NATRIURETIC PEPTIDE: B Natriuretic Peptide: 1829.9 pg/mL — ABNORMAL HIGH (ref 0.0–100.0)

## 2021-07-16 LAB — SARS CORONAVIRUS 2 BY RT PCR: SARS Coronavirus 2 by RT PCR: NEGATIVE

## 2021-07-16 LAB — STREP PNEUMONIAE URINARY ANTIGEN: Strep Pneumo Urinary Antigen: NEGATIVE

## 2021-07-16 LAB — CREATININE, SERUM
Creatinine, Ser: 1.61 mg/dL — ABNORMAL HIGH (ref 0.44–1.00)
GFR, Estimated: 30 mL/min — ABNORMAL LOW (ref >60)

## 2021-07-16 LAB — TROPONIN I (HIGH SENSITIVITY)
Troponin I (High Sensitivity): 379 ng/L (ref <18)
Troponin I (High Sensitivity): 398 ng/L (ref <18)

## 2021-07-16 LAB — CBG MONITORING, ED: Glucose-Capillary: 125 mg/dL — ABNORMAL HIGH (ref 70–99)

## 2021-07-16 LAB — GLUCOSE, CAPILLARY: Glucose-Capillary: 87 mg/dL (ref 70–99)

## 2021-07-16 MED ORDER — SODIUM CHLORIDE 0.9% FLUSH: 3.0000 mL | INTRAVENOUS | Status: DC | PRN

## 2021-07-16 MED ORDER — METOPROLOL SUCCINATE ER 100 MG PO TB24
100.0000 mg | ORAL_TABLET | Freq: Every day | ORAL | Status: DC
  Administered 2021-07-17 – 2021-07-27 (×11): 100 mg via ORAL
  Filled 2021-07-16 (×11): qty 1

## 2021-07-16 MED ORDER — HEPARIN SODIUM (PORCINE) 5000 UNIT/ML IJ SOLN
5000.0000 [IU] | Freq: Three times a day (TID) | INTRAMUSCULAR | Status: DC
  Administered 2021-07-16 – 2021-07-27 (×32): 5000 [IU] via SUBCUTANEOUS
  Filled 2021-07-16 (×32): qty 1

## 2021-07-16 MED ORDER — SODIUM CHLORIDE 0.9 % IV SOLN
500.0000 mg | INTRAVENOUS | Status: AC
  Administered 2021-07-16 – 2021-07-18 (×3): 500 mg via INTRAVENOUS
  Filled 2021-07-16 (×4): qty 5

## 2021-07-16 MED ORDER — MENTHOL (TOPICAL ANALGESIC) 4 % EX GEL: 1.0000 | Freq: Two times a day (BID) | CUTANEOUS | Status: DC | PRN

## 2021-07-16 MED ORDER — SODIUM CHLORIDE 0.9 % IV SOLN: 250.0000 mL | INTRAVENOUS | Status: DC | PRN

## 2021-07-16 MED ORDER — LORATADINE 10 MG PO TABS
10.0000 mg | ORAL_TABLET | Freq: Every day | ORAL | Status: DC
  Administered 2021-07-17 – 2021-07-27 (×11): 10 mg via ORAL
  Filled 2021-07-16 (×11): qty 1

## 2021-07-16 MED ORDER — DICLOFENAC SODIUM 1 % TD GEL: 2.0000 g | Freq: Four times a day (QID) | TRANSDERMAL | Status: DC | PRN

## 2021-07-16 MED ORDER — MUSCLE RUB 10-15 % EX CREA
TOPICAL_CREAM | Freq: Two times a day (BID) | CUTANEOUS | Status: DC | PRN
  Filled 2021-07-16 (×2): qty 85

## 2021-07-16 MED ORDER — SODIUM CHLORIDE 0.9% FLUSH
3.0000 mL | Freq: Two times a day (BID) | INTRAVENOUS | Status: DC
  Administered 2021-07-16 – 2021-07-18 (×5): 3 mL via INTRAVENOUS

## 2021-07-16 MED ORDER — ACETAMINOPHEN 650 MG RE SUPP: 650.0000 mg | Freq: Four times a day (QID) | RECTAL | Status: DC | PRN

## 2021-07-16 MED ORDER — ACETAMINOPHEN 325 MG PO TABS
650.0000 mg | ORAL_TABLET | Freq: Four times a day (QID) | ORAL | Status: DC | PRN
  Administered 2021-07-16 – 2021-07-19 (×3): 650 mg via ORAL
  Filled 2021-07-16 (×3): qty 2

## 2021-07-16 MED ORDER — ATORVASTATIN CALCIUM 10 MG PO TABS
10.0000 mg | ORAL_TABLET | Freq: Every day | ORAL | Status: DC
  Administered 2021-07-16 – 2021-07-27 (×12): 10 mg via ORAL
  Filled 2021-07-16 (×12): qty 1

## 2021-07-16 MED ORDER — POLYETHYLENE GLYCOL 3350 17 G PO PACK: 17.0000 g | PACK | Freq: Every day | ORAL | Status: DC | PRN

## 2021-07-16 MED ORDER — IPRATROPIUM-ALBUTEROL 0.5-2.5 (3) MG/3ML IN SOLN
3.0000 mL | Freq: Four times a day (QID) | RESPIRATORY_TRACT | Status: DC | PRN
  Administered 2021-07-18 – 2021-07-19 (×2): 3 mL via RESPIRATORY_TRACT
  Filled 2021-07-16 (×2): qty 3

## 2021-07-16 MED ORDER — SODIUM CHLORIDE 0.9 % IV SOLN
3.0000 g | Freq: Two times a day (BID) | INTRAVENOUS | Status: DC
  Administered 2021-07-16 – 2021-07-19 (×6): 3 g via INTRAVENOUS
  Filled 2021-07-16 (×6): qty 8

## 2021-07-16 MED ORDER — PANTOPRAZOLE SODIUM 40 MG PO TBEC
40.0000 mg | DELAYED_RELEASE_TABLET | Freq: Every day | ORAL | Status: DC
  Administered 2021-07-17 – 2021-07-27 (×11): 40 mg via ORAL
  Filled 2021-07-16 (×11): qty 1

## 2021-07-16 MED ORDER — INSULIN ASPART 100 UNIT/ML IJ SOLN
0.0000 [IU] | Freq: Three times a day (TID) | INTRAMUSCULAR | Status: DC
  Administered 2021-07-17: 2 [IU] via SUBCUTANEOUS

## 2021-07-16 MED ORDER — HYDRALAZINE HCL 20 MG/ML IJ SOLN: 10.0000 mg | Freq: Three times a day (TID) | INTRAMUSCULAR | Status: DC | PRN

## 2021-07-16 MED ORDER — ADULT MULTIVITAMIN W/MINERALS CH
1.0000 | ORAL_TABLET | Freq: Every day | ORAL | Status: DC
  Administered 2021-07-17 – 2021-07-27 (×11): 1 via ORAL
  Filled 2021-07-16 (×11): qty 1

## 2021-07-16 MED ORDER — ONDANSETRON HCL 4 MG/2ML IJ SOLN
4.0000 mg | Freq: Four times a day (QID) | INTRAMUSCULAR | Status: DC | PRN
  Administered 2021-07-25 (×2): 4 mg via INTRAVENOUS
  Filled 2021-07-16 (×2): qty 2

## 2021-07-16 MED ORDER — FUROSEMIDE 10 MG/ML IJ SOLN
60.0000 mg | Freq: Two times a day (BID) | INTRAMUSCULAR | Status: DC
  Administered 2021-07-16 – 2021-07-18 (×4): 60 mg via INTRAVENOUS
  Filled 2021-07-16 (×4): qty 6

## 2021-07-16 MED ORDER — METOPROLOL SUCCINATE ER 25 MG PO TB24: 100.0000 mg | ORAL_TABLET | Freq: Every day | ORAL | Status: DC

## 2021-07-16 MED ORDER — ONDANSETRON HCL 4 MG PO TABS
4.0000 mg | ORAL_TABLET | Freq: Four times a day (QID) | ORAL | Status: DC | PRN
  Administered 2021-07-24: 4 mg via ORAL
  Filled 2021-07-16: qty 1

## 2021-07-16 MED ORDER — DICLOFENAC SODIUM 1 % EX GEL
2.0000 g | Freq: Four times a day (QID) | CUTANEOUS | Status: DC | PRN
  Filled 2021-07-16: qty 100

## 2021-07-16 MED ORDER — HYDRALAZINE HCL 50 MG PO TABS
50.0000 mg | ORAL_TABLET | Freq: Three times a day (TID) | ORAL | Status: DC
  Administered 2021-07-16 – 2021-07-17 (×3): 50 mg via ORAL
  Filled 2021-07-16: qty 2
  Filled 2021-07-16 (×2): qty 1

## 2021-07-16 NOTE — Progress Notes (Addendum)
Pharmacy Antibiotic Note  Rebecca Elliott is a 88 y.o. female for which pharmacy has been consulted for unasyn dosing for  aspiration pna .  Patient with a history of COPD, HFpEF, chronic hypoxic resp failure, T2DM, CKD. Recent admission for hypoglycemia & AKI. Patient presenting with worsening LE swelling and SOB.  SCr 1.66 - near baseline WBC 21.6>23.9; T 98.8 F; HR 79; RR 24  Plan: Unasyn 3g q12h Azith per MD Trend WBC, Fever, Renal function, & Clinical course F/u cultures, clinical course, WBC, fever De-escalate when able    No data recorded.  Recent Labs  Lab 07/11/21 2255 07/11/21 2317 07/12/21 0549 07/13/21 0435 07/16/21 1346  WBC 8.2  --   --   --  21.6*  CREATININE 2.41* 2.60* 2.03* 1.47* 1.66*    Estimated Creatinine Clearance: 23.8 mL/min (A) (by C-G formula based on SCr of 1.66 mg/dL (H)).    No Known Allergies  Antimicrobials this admission: unasyn 6/29 >>  azithromycin 6/29 >>   Microbiology results: Pending  Thank you for allowing pharmacy to be a part of this patient's care.  Lorelei Pont, PharmD, BCPS 07/16/2021 4:34 PM ED Clinical Pharmacist -  [phone]

## 2021-07-16 NOTE — ED Notes (Addendum)
This RN was able to assist patient to bedside commode at this time. Patient had 1 stool occurrence. After getting patient back in bed patient became SOB although spo2 maintained at 100% on 2 liters via nasal cannula.

## 2021-07-16 NOTE — ED Provider Notes (Signed)
[facility] DEPARTMENT Provider Note   CSN: [id number] Arrival date & time: 07/16/21  1326     History  Chief Complaint  Patient presents with   Shortness of Breath    Rebecca Elliott is a 88 y.o. female.   Shortness of Breath Patient presents with shortness of breath the last few days.  Was recently inpatient in the hospital for hypoglycemia.  Thought to be secondary to medication reaction due to worsening kidney function.  Had been reportedly volume depleted at that time and given some fluids.  Lasix had been held.  Started back on the discharge but states became more short of breath.  Has had a cough with some clear sputum production.  Is on oxygen at home as needed.  More swelling in her legs and states more short of breath than normal.    Past Medical History:  Diagnosis Date   Allergic rhinitis    COPD  and ASTHMA    Depression    Diabetes mellitus    as an adult   DJD (degenerative joint disease)    GERD (gastroesophageal reflux disease)    and HH w/ reflux   History of colonic polyps    Hyperlipidemia    Hypertension     Home Medications Prior to Admission medications   Medication Sig Start Date End Date Taking? Authorizing Provider  acetaminophen (TYLENOL) 500 MG tablet Take 500 mg by mouth every 6 (six) hours as needed for mild pain, headache or fever.   Yes [provider]  albuterol (VENTOLIN HFA) 108 (90 Base) MCG/ACT inhaler USE 2 INHALATIONS EVERY 6 HOURS AS NEEDED FOR WHEEZING OR SHORTNESS OF BREATH Patient taking differently: Inhale 2 puffs into the lungs every 6 (six) hours as needed for wheezing or shortness of breath. 02/27/21  Yes Paz, Alda Berthold, MD  atorvastatin (LIPITOR) 10 MG tablet TAKE 1 TABLET DAILY Patient taking differently: Take 10 mg by mouth daily. 06/22/21  Yes Colon Branch, MD  cefUROXime (CEFTIN) 500 MG tablet Take 1 tablet (500 mg total) by mouth 2 (two) times daily with a meal. 07/14/21  Yes Paz, Alda Berthold, MD   Cyanocobalamin (VITAMIN B-12 PO) Take 500 Units by mouth daily.   Yes [provider]  dextromethorphan-guaiFENesin (MUCINEX DM) 30-600 MG per 12 hr tablet Take 1 tablet by mouth daily.   Yes [provider]  diclofenac sodium (VOLTAREN) 1 % GEL Apply 2 g topically 4 (four) times daily. Patient taking differently: Apply 2 g topically 4 (four) times daily as needed (knee pain). 07/27/18  Yes Paz, Alda Berthold, MD  famotidine (PEPCID) 20 MG tablet TAKE 1 TABLET TWICE A DAY Patient taking differently: Take 20 mg by mouth 2 (two) times daily. 05/28/21  Yes Paz, Alda Berthold, MD  furosemide (LASIX) 40 MG tablet Take one tablet by mouth daily in the morning. Take one tablet by mouth in the evening on Monday and Friday 40 mg Oral Daily Patient taking differently: Take 40 mg by mouth See admin instructions. Take one tablet by mouth daily in the morning. Take one tablet by mouth in the evening on Monday and Friday 40 mg Oral Daily 07/13/21  Yes Samuella Cota, MD  hydrALAZINE (APRESOLINE) 25 MG tablet Take 2 tablets (50 mg total) by mouth 3 (three) times daily. 03/16/21  Yes Paz, Alda Berthold, MD  ipratropium-albuterol (DUONEB) 0.5-2.5 (3) MG/3ML SOLN Take 3 mLs by nebulization every 6 (six) hours as needed. 01/22/20  Yes Paz,  Alda Berthold, MD  loratadine (CLARITIN) 10 MG tablet Take 10 mg by mouth daily.   Yes [provider]  Menthol, Topical Analgesic, (BIOFREEZE) 4 % GEL Apply 1 Application topically 2 (two) times daily as needed (knee pain).   Yes [provider]  metoprolol succinate (TOPROL-XL) 100 MG 24 hr tablet TAKE 1 TABLET BY MOUTH DAILY. TAKE WITH OR IMMEDIATELY FOLLOWING A MEAL. Patient taking differently: Take 100 mg by mouth daily. 06/29/21  Yes Paz, Alda Berthold, MD  Multiple Vitamins-Minerals (CENTRUM SILVER) tablet Chew 1 tablet by mouth daily.   Yes [provider]  OXYGEN Inhale 2 L into the lungs daily as needed (shortness of breath).   Yes [provider]   pantoprazole (PROTONIX) 40 MG tablet Take 1 tablet (40 mg total) by mouth daily. 07/13/21  Yes Samuella Cota, MD  sitaGLIPtin (JANUVIA) 50 MG tablet TAKE 1 TABLET DAILY Patient taking differently: Take 50 mg by mouth daily. 06/22/21  Yes Paz, Alda Berthold, MD  valsartan (DIOVAN) 160 MG tablet TAKE 1 TABLET BY MOUTH EVERY DAY Patient taking differently: Take 160 mg by mouth daily. 06/29/21  Yes Colon Branch, MD      Allergies    Patient has no known allergies.    Review of Systems   Review of Systems  Respiratory:  Positive for shortness of breath.     Physical Exam Updated Vital Signs BP (!) 144/61   Pulse 81   Resp (!) 26   SpO2 100%  Physical Exam Vitals and nursing note reviewed.  HENT:     Head: Normocephalic.  Cardiovascular:     Rate and Rhythm: Normal rate and regular rhythm.  Pulmonary:     Breath sounds: Rales present.     Comments: Possible rales bilateral bases. Chest:     Chest wall: No tenderness.  Musculoskeletal:     Right lower leg: Edema present.     Left lower leg: Edema present.     Comments: Pitting edema bilateral lower extremities with mild tenderness.  Skin:    General: Skin is warm.     Capillary Refill: Capillary refill takes less than 2 seconds.  Neurological:     Mental Status: She is alert and oriented to person, place, and time.     ED Results / Procedures / Treatments   Labs (all labs ordered are listed, but only abnormal results are displayed) Labs Reviewed  COMPREHENSIVE METABOLIC PANEL - Abnormal; Notable for the following components:      Result Value   Sodium 133 (*)    Glucose, Bld 114 (*)    Creatinine, Ser 1.66 (*)    Calcium 8.5 (*)    Albumin 2.4 (*)    AST 85 (*)    GFR, Estimated 29 (*)    All other components within normal limits  CBC WITH DIFFERENTIAL/PLATELET - Abnormal; Notable for the following components:   WBC 21.6 (*)    Hemoglobin 9.9 (*)    HCT 31.5 (*)    MCV 75.0 (*)    MCH 23.6 (*)    RDW 15.8 (*)     Neutro Abs 17.6 (*)    Monocytes Absolute 1.8 (*)    Abs Immature Granulocytes 0.19 (*)    All other components within normal limits  BRAIN NATRIURETIC PEPTIDE - Abnormal; Notable for the following components:   B Natriuretic Peptide 1,829.9 (*)    All other components within normal limits  TROPONIN I (HIGH SENSITIVITY) - Abnormal; Notable  for the following components:   Troponin I (High Sensitivity) 379 (*)    All other components within normal limits  TROPONIN I (HIGH SENSITIVITY)    EKG EKG Interpretation  Date/Time:  Thursday July 16 2021 13:35:46 EDT Ventricular Rate:  86 PR Interval:  175 QRS Duration: 104 QT Interval:  368 QTC Calculation: 441 R Axis:   21 Text Interpretation: Sinus rhythm Confirmed by Davonna Belling [phone]) on 07/16/2021 3:17:35 PM  Radiology DG Chest Portable 1 View  Result Date: 07/16/2021 CLINICAL DATA:  Shortness of breath EXAM: PORTABLE CHEST 1 VIEW COMPARISON:  11/11/2020 FINDINGS: Patient rotated right. The Chin overlies the apices. Moderate cardiomegaly. Atherosclerosis in the transverse aorta. Small right and probable trace left pleural effusions. No pneumothorax. Pulmonary interstitial prominence is slightly increased. Right greater than left base airspace disease. Similar on the right and slightly decreased on the left. IMPRESSION: Cardiomegaly with mild pulmonary venous congestion. Right and possible small left pleural effusions. scattered right-greater-than-left basilar airspace disease is most likely atelectasis. At the right lung base, pneumonia or aspiration cannot be excluded. Aortic Atherosclerosis (ICD10-I70.0). Electronically Signed   By: Abigail Miyamoto M.D.   On: 07/16/2021 14:07    Procedures Procedures    Medications Ordered in ED Medications - No data to display  ED Course/ Medical Decision Making/ A&P                           Medical Decision Making Amount and/or Complexity of Data Reviewed Labs: ordered. Radiology:  ordered.   Patient presents with shortness of breath.  Differential diagnosis long but includes pneumonia, CHF, COPD, pneumothorax. Recently admitted to the hospital for hypoglycemia thought to be secondary to acute kidney injury causing her medicines to stay around to drop her sugar.  Had adjustments of her fluid status because of that it had been off her Lasix.  However since discharge has had more swelling in her legs.  More shortness of breath.  Does have a little bit of cough with some white sputum at times.  No fevers.  However does have a leukocytosis with a white count of 20,000.  Creatinine has increased back to 1.6.  Was 1.5 at discharge but had been up to 2.6 during admission.  BNP is elevated as is his troponin.  I think a troponin is likely secondary to demand.  Chest x-ray shows some volume overload and potentially at the right base of pneumonia.  With leukocytosis and cough we will treat for the pneumonia.  However I think with increasing shortness of breath edema kidney injury and pneumonia would benefit from mission to the hospital.  Will discuss with hospitalist         Final Clinical Impression(s) / ED Diagnoses Final diagnoses:  Congestive heart failure, unspecified HF chronicity, unspecified heart failure type [facility]  Community acquired pneumonia, unspecified laterality    Rx / DC Orders ED Discharge Orders     None         Davonna Belling, MD 07/16/21 1527

## 2021-07-16 NOTE — ED Triage Notes (Signed)
Rebecca Elliott coming from home where she lives with family. A week ago she had an episode of hypoglycemia and came here. Was treated and stayed few days in hospital. CHF gave her extra lasix to take after discharge for few days to counterbalance the fluid given in hospital. She was SOB before leaving hospital and has remained so since being discharge. Did take the lasix as prescribed. Grossly edematous. Not on baseline O2 at home.

## 2021-07-16 NOTE — ED Notes (Signed)
This RN went into patient's room to assist with bed linen change. Patient had 1 urine occurrence and 1 stool occurrence. Upon rolling patient to perform pericare, patient's bottom noted to be pink near sacral area. Pink area noted to right buttocks with breakdown.

## 2021-07-16 NOTE — H&P (Signed)
History and Physical    Patient: Rebecca Elliott XBM:[id number] DOB: 1926-10-02 DOA: 07/16/2021 DOS: the patient was seen and examined on 07/16/2021 PCP: Colon Branch, MD  Patient coming from: Home  Chief Complaint:  Chief Complaint  Patient presents with   Shortness of Breath   HPI: Rebecca Elliott is a 88 y.o. female with medical history significant of COPD, chronic HFpEF, chronic hypoxic respiratory failure on home O2 2 L (as needed), DM-2, CKD stage IIIb who was recently hospitalized from 6/24-6/26 for hypoglycemia in the setting of AKI-who presented to the hospital today with worsening lower extremity swelling and shortness of breath.  Per patient's daughter who provides most of the history at bedside-since discharge from the hospital she has had gradual swelling of her lower extremities.  Over the past 1-2 days-she has noticed more shortness of breath, this morning-patient could not even walk 10 feet without having exertional dyspnea.  Patient denies any PND or orthopnea.  She does have productive cough with mostly whitish phlegm.  There is no history of fever.  Due to worsening SOB-patient was brought to the hospital where patient was found to have RLL PNA-and acute on chronic diastolic heart failure.  She was also requiring around 4 L of oxygen (at baseline requires 2 L).  Hospitalist service was asked to admit this patient for further evaluation and treatment.  Patient's daughter has noticed some coughing episodes with eating.  There is no nausea vomiting.  No chest pain.  Per patient's daughter-she has been compliant with all of her medications as prescribed.  There is no history of abdominal pain.    Review of Systems: As mentioned in the history of present illness. All other systems reviewed and are negative. Past Medical History:  Diagnosis Date   Allergic rhinitis    COPD  and ASTHMA    Depression    Diabetes mellitus    as an adult   DJD (degenerative joint disease)     GERD (gastroesophageal reflux disease)    and HH w/ reflux   History of colonic polyps    Hyperlipidemia    Hypertension    Past Surgical History:  Procedure Laterality Date   ABDOMINAL HYSTERECTOMY     apparently no oophorectomy   APPENDECTOMY     bilateral breast tumor     BREAST EXCISIONAL BIOPSY Bilateral    one in her 20's and one in her 12's, both benign   CATARACT EXTRACTION Bilateral    Groat   CATARACT EXTRACTION, BILATERAL     CHOLECYSTECTOMY     TONSILLECTOMY     TUBAL LIGATION     Social History:  reports that she quit smoking about 28 years ago. Her smoking use included cigarettes. She has a 35.00 pack-year smoking history. She has been exposed to tobacco smoke. She has never used smokeless tobacco. She reports that she does not drink alcohol and does not use drugs.  No Known Allergies  Family History  Problem Relation Age of Onset   Breast cancer Sister        x 2   Diabetes Father    Diabetes Sister    Heart attack Brother        early 62s   Heart attack Son    Pancreatic cancer Son    Stomach cancer Sister    Colon cancer Neg Hx     Prior to Admission medications   Medication Sig Start Date End Date Taking? Authorizing Provider  acetaminophen (TYLENOL)  500 MG tablet Take 500 mg by mouth every 6 (six) hours as needed for mild pain, headache or fever.   Yes [provider]  albuterol (VENTOLIN HFA) 108 (90 Base) MCG/ACT inhaler USE 2 INHALATIONS EVERY 6 HOURS AS NEEDED FOR WHEEZING OR SHORTNESS OF BREATH Patient taking differently: Inhale 2 puffs into the lungs every 6 (six) hours as needed for wheezing or shortness of breath. 02/27/21  Yes Paz, Alda Berthold, MD  atorvastatin (LIPITOR) 10 MG tablet TAKE 1 TABLET DAILY Patient taking differently: Take 10 mg by mouth daily. 06/22/21  Yes Colon Branch, MD  cefUROXime (CEFTIN) 500 MG tablet Take 1 tablet (500 mg total) by mouth 2 (two) times daily with a meal. 07/14/21  Yes Paz, Alda Berthold, MD  Cyanocobalamin  (VITAMIN B-12 PO) Take 500 Units by mouth daily.   Yes [provider]  dextromethorphan-guaiFENesin (MUCINEX DM) 30-600 MG per 12 hr tablet Take 1 tablet by mouth daily.   Yes [provider]  diclofenac sodium (VOLTAREN) 1 % GEL Apply 2 g topically 4 (four) times daily. Patient taking differently: Apply 2 g topically 4 (four) times daily as needed (knee pain). 07/27/18  Yes Paz, Alda Berthold, MD  famotidine (PEPCID) 20 MG tablet TAKE 1 TABLET TWICE A DAY Patient taking differently: Take 20 mg by mouth 2 (two) times daily. 05/28/21  Yes Paz, Alda Berthold, MD  furosemide (LASIX) 40 MG tablet Take one tablet by mouth daily in the morning. Take one tablet by mouth in the evening on Monday and Friday 40 mg Oral Daily Patient taking differently: Take 40 mg by mouth See admin instructions. Take one tablet by mouth daily in the morning. Take one tablet by mouth in the evening on Monday and Friday 40 mg Oral Daily 07/13/21  Yes Samuella Cota, MD  hydrALAZINE (APRESOLINE) 25 MG tablet Take 2 tablets (50 mg total) by mouth 3 (three) times daily. 03/16/21  Yes Paz, Alda Berthold, MD  ipratropium-albuterol (DUONEB) 0.5-2.5 (3) MG/3ML SOLN Take 3 mLs by nebulization every 6 (six) hours as needed. 01/22/20  Yes Paz, Alda Berthold, MD  loratadine (CLARITIN) 10 MG tablet Take 10 mg by mouth daily.   Yes [provider]  Menthol, Topical Analgesic, (BIOFREEZE) 4 % GEL Apply 1 Application topically 2 (two) times daily as needed (knee pain).   Yes [provider]  metoprolol succinate (TOPROL-XL) 100 MG 24 hr tablet TAKE 1 TABLET BY MOUTH DAILY. TAKE WITH OR IMMEDIATELY FOLLOWING A MEAL. Patient taking differently: Take 100 mg by mouth daily. 06/29/21  Yes Paz, Alda Berthold, MD  Multiple Vitamins-Minerals (CENTRUM SILVER) tablet Chew 1 tablet by mouth daily.   Yes [provider]  OXYGEN Inhale 2 L into the lungs daily as needed (shortness of breath).   Yes [provider]  pantoprazole (PROTONIX)  40 MG tablet Take 1 tablet (40 mg total) by mouth daily. 07/13/21  Yes Samuella Cota, MD  sitaGLIPtin (JANUVIA) 50 MG tablet TAKE 1 TABLET DAILY Patient taking differently: Take 50 mg by mouth daily. 06/22/21  Yes Paz, Alda Berthold, MD  valsartan (DIOVAN) 160 MG tablet TAKE 1 TABLET BY MOUTH EVERY DAY Patient taking differently: Take 160 mg by mouth daily. 06/29/21  Yes Colon Branch, MD    Physical Exam: Vitals:   07/16/21 1515 07/16/21 1530 07/16/21 1545 07/16/21 1600  BP: (!) 141/60 (!) 150/59 (!) 140/57 (!) 141/56  Pulse: 80 79 79 78  Resp: (!) 25 (!) 24 (!)  24 (!) 26  SpO2: 100% 100% 100% 99%    Data Reviewed: CBC    Component Value Date/Time   WBC 21.6 (H) 07/16/2021 1346   RBC 4.20 07/16/2021 1346   HGB 9.9 (L) 07/16/2021 1346   HCT 31.5 (L) 07/16/2021 1346   PLT 320 07/16/2021 1346   MCV 75.0 (L) 07/16/2021 1346   MCH 23.6 (L) 07/16/2021 1346   MCHC 31.4 07/16/2021 1346   RDW 15.8 (H) 07/16/2021 1346   LYMPHSABS 1.9 07/16/2021 1346   MONOABS 1.8 (H) 07/16/2021 1346   EOSABS 0.0 07/16/2021 1346   BASOSABS 0.1 07/16/2021 1346        Latest Ref Rng & Units 07/16/2021    1:46 PM 07/13/2021    4:35 AM 07/12/2021    5:49 AM  BMP  Glucose 70 - 99 mg/dL 114  101  92   BUN 8 - 23 mg/dL 19  22  32   Creatinine 0.44 - 1.00 mg/dL 1.66  1.47  2.03   Sodium 135 - 145 mmol/L 133  135  134   Potassium 3.5 - 5.1 mmol/L 4.5  3.9  3.1   Chloride 98 - 111 mmol/L 102  109  103   CO2 22 - 32 mmol/L '23  22  23   '$ Calcium 8.9 - 10.3 mg/dL 8.5  7.8  7.5      Assessment and Plan: Acute on chronic hypoxic respiratory failure due to probable aspiration pneumonia and HFpEF with exacerbation: Requiring approximately 4 L of oxygen (2 L at baseline since recent discharge)-thought to be due to combination of probable aspiration PNA and that may have caused her to have decompensated HFpEF.  Plan is to start IV diuretics, IV Unasyn/Zithromax-obtain SLP eval.  She has CKD stage IIIb-and will need close  monitoring of her renal function.  Monitor intake/output-and follow clinically.  DM-2: Hold oral hypoglycemic agents-allow permissive hyperglycemia given recent hypoglycemic episode.  Monitor closely on SSI.  CKD stage IIIb: At baseline-watch closely.  HTN: Continue beta-blocker, hydralazine-hold losartan to give more room for diuresis.  Follow and optimize accordingly.  HLD: Continue statin  GERD: Continue PPI  COPD with chronic hypoxic respiratory failure on 2 L of oxygen (as needed):-Normally uses oxygen as needed-but since her discharge from her most recent hospitalization-she has been on 2 L of oxygen 24/7.  She has no evidence of exacerbation at this point-continue as needed bronchodilators.  OA bilateral knees: Supportive care  Chronic debility/deconditioning: Normally walks around the house with the help of a walker/cane-obtaining PT/OT eval.   Advance Care Planning:   Code Status: Full Code-discussed with daughter  Consults: None  Family Communication: Daughter at bedside  Severity of Illness: The appropriate patient status for this patient is INPATIENT. Inpatient status is judged to be reasonable and necessary in order to provide the required intensity of service to ensure the patient's safety. The patient's presenting symptoms, physical exam findings, and initial radiographic and laboratory data in the context of their chronic comorbidities is felt to place them at high risk for further clinical deterioration. Furthermore, it is not anticipated that the patient will be medically stable for discharge from the hospital within 2 midnights of admission.   * I certify that at the point of admission it is my clinical judgment that the patient will require inpatient hospital care spanning beyond 2 midnights from the point of admission due to high intensity of service, high risk for further deterioration and high frequency of surveillance required.*  Author: Oren Binet,  MD 07/16/2021 4:24 PM  For on call review [email or web address].

## 2021-07-17 ENCOUNTER — Inpatient Hospital Stay: Payer: Medicare Other | Admitting: Internal Medicine

## 2021-07-17 ENCOUNTER — Inpatient Hospital Stay (HOSPITAL_COMMUNITY): Payer: Medicare Other

## 2021-07-17 DIAGNOSIS — E1140 Type 2 diabetes mellitus with diabetic neuropathy, unspecified: Secondary | ICD-10-CM | POA: Diagnosis not present

## 2021-07-17 DIAGNOSIS — Z7189 Other specified counseling: Secondary | ICD-10-CM

## 2021-07-17 DIAGNOSIS — Z515 Encounter for palliative care: Secondary | ICD-10-CM

## 2021-07-17 DIAGNOSIS — J411 Mucopurulent chronic bronchitis: Secondary | ICD-10-CM | POA: Diagnosis not present

## 2021-07-17 DIAGNOSIS — N1832 Chronic kidney disease, stage 3b: Secondary | ICD-10-CM | POA: Diagnosis not present

## 2021-07-17 DIAGNOSIS — I5031 Acute diastolic (congestive) heart failure: Secondary | ICD-10-CM

## 2021-07-17 LAB — CBC WITH DIFFERENTIAL/PLATELET
Abs Immature Granulocytes: 0.13 10*3/uL — ABNORMAL HIGH (ref 0.00–0.07)
Basophils Absolute: 0 10*3/uL (ref 0.0–0.1)
Basophils Relative: 0 %
Eosinophils Absolute: 0 10*3/uL (ref 0.0–0.5)
Eosinophils Relative: 0 %
HCT: 29.2 % — ABNORMAL LOW (ref 36.0–46.0)
Hemoglobin: 9.2 g/dL — ABNORMAL LOW (ref 12.0–15.0)
Immature Granulocytes: 1 %
Lymphocytes Relative: 11 %
Lymphs Abs: 2.2 10*3/uL (ref 0.7–4.0)
MCH: 23.7 pg — ABNORMAL LOW (ref 26.0–34.0)
MCHC: 31.5 g/dL (ref 30.0–36.0)
MCV: 75.1 fL — ABNORMAL LOW (ref 80.0–100.0)
Monocytes Absolute: 1.2 10*3/uL — ABNORMAL HIGH (ref 0.1–1.0)
Monocytes Relative: 6 %
Neutro Abs: 16.2 10*3/uL — ABNORMAL HIGH (ref 1.7–7.7)
Neutrophils Relative %: 82 %
Platelets: 284 10*3/uL (ref 150–400)
RBC: 3.89 MIL/uL (ref 3.87–5.11)
RDW: 15.9 % — ABNORMAL HIGH (ref 11.5–15.5)
WBC: 19.8 10*3/uL — ABNORMAL HIGH (ref 4.0–10.5)
nRBC: 0 % (ref 0.0–0.2)

## 2021-07-17 LAB — ECHOCARDIOGRAM COMPLETE
AR max vel: 1.61 cm2
AV Area VTI: 1.49 cm2
AV Area mean vel: 1.44 cm2
AV Mean grad: 17.7 mmHg
AV Peak grad: 27.6 mmHg
Ao pk vel: 2.63 m/s
Area-P 1/2: 4.06 cm2
Calc EF: 50.1 %
Height: 66 in
P 1/2 time: 381 msec
S' Lateral: 3.4 cm
Single Plane A2C EF: 54.3 %
Single Plane A4C EF: 48.3 %
Weight: 3149.93 oz

## 2021-07-17 LAB — GLUCOSE, CAPILLARY
Glucose-Capillary: 70 mg/dL (ref 70–99)
Glucose-Capillary: 149 mg/dL — ABNORMAL HIGH (ref 70–99)
Glucose-Capillary: 138 mg/dL — ABNORMAL HIGH (ref 70–99)
Glucose-Capillary: 141 mg/dL — ABNORMAL HIGH (ref 70–99)

## 2021-07-17 LAB — CBC
HCT: 31.8 % — ABNORMAL LOW (ref 36.0–46.0)
Hemoglobin: 10.1 g/dL — ABNORMAL LOW (ref 12.0–15.0)
MCH: 23.7 pg — ABNORMAL LOW (ref 26.0–34.0)
MCHC: 31.8 g/dL (ref 30.0–36.0)
MCV: 74.6 fL — ABNORMAL LOW (ref 80.0–100.0)
Platelets: 314 10*3/uL (ref 150–400)
RBC: 4.26 MIL/uL (ref 3.87–5.11)
RDW: 15.9 % — ABNORMAL HIGH (ref 11.5–15.5)
WBC: 25 10*3/uL — ABNORMAL HIGH (ref 4.0–10.5)
nRBC: 0 % (ref 0.0–0.2)

## 2021-07-17 LAB — BASIC METABOLIC PANEL
Anion gap: 8 (ref 5–15)
BUN: 20 mg/dL (ref 8–23)
CO2: 24 mmol/L (ref 22–32)
Calcium: 8.3 mg/dL — ABNORMAL LOW (ref 8.9–10.3)
Chloride: 102 mmol/L (ref 98–111)
Creatinine, Ser: 1.75 mg/dL — ABNORMAL HIGH (ref 0.44–1.00)
GFR, Estimated: 27 mL/min — ABNORMAL LOW (ref >60)
Glucose, Bld: 70 mg/dL (ref 70–99)
Potassium: 3.7 mmol/L (ref 3.5–5.1)
Sodium: 134 mmol/L — ABNORMAL LOW (ref 135–145)

## 2021-07-17 MED ORDER — GUAIFENESIN ER 600 MG PO TB12
600.0000 mg | ORAL_TABLET | Freq: Two times a day (BID) | ORAL | Status: DC
  Administered 2021-07-17 – 2021-07-27 (×21): 600 mg via ORAL
  Filled 2021-07-17 (×21): qty 1

## 2021-07-17 MED ORDER — BUDESONIDE 0.25 MG/2ML IN SUSP
0.2500 mg | Freq: Two times a day (BID) | RESPIRATORY_TRACT | Status: DC
  Administered 2021-07-17 – 2021-07-27 (×20): 0.25 mg via RESPIRATORY_TRACT
  Filled 2021-07-17 (×20): qty 2

## 2021-07-17 MED ORDER — REVEFENACIN 175 MCG/3ML IN SOLN
175.0000 ug | Freq: Every day | RESPIRATORY_TRACT | Status: DC
  Filled 2021-07-17: qty 3

## 2021-07-17 MED ORDER — HYDRALAZINE HCL 25 MG PO TABS
25.0000 mg | ORAL_TABLET | Freq: Three times a day (TID) | ORAL | Status: DC
  Administered 2021-07-17 – 2021-07-20 (×9): 25 mg via ORAL
  Filled 2021-07-17 (×9): qty 1

## 2021-07-17 MED ORDER — ARFORMOTEROL TARTRATE 15 MCG/2ML IN NEBU
15.0000 ug | INHALATION_SOLUTION | Freq: Two times a day (BID) | RESPIRATORY_TRACT | Status: DC
  Administered 2021-07-17 – 2021-07-27 (×20): 15 ug via RESPIRATORY_TRACT
  Filled 2021-07-17 (×20): qty 2

## 2021-07-17 MED ORDER — REVEFENACIN 175 MCG/3ML IN SOLN
175.0000 ug | Freq: Every day | RESPIRATORY_TRACT | Status: DC
  Administered 2021-07-18 – 2021-07-27 (×10): 175 ug via RESPIRATORY_TRACT
  Filled 2021-07-17 (×10): qty 3

## 2021-07-17 NOTE — Progress Notes (Signed)
Occupational Therapy Evaluation Patient Details Name: Rebecca Elliott MRN: [id number] DOB: 1926-01-25 Today's Date: 07/17/2021   History of Present Illness Patient is a 88 yo woman that presents to the hospital for c/o of LE sweliing and SOB.  Patient Dx with acute on chronic HFpEF exacerbation and R LL PNA.  PMHx includes COPD, chronic HF, chronic hypoxic resp failure 2 liters at home, DM, CKD stage 3, HTN, HLD, COPD, depression and recent hospital stay 6/24-6/26 for hypoglycemia.   Clinical Impression   Patient endorses that she is normally Independent in ADLs and that her daughter has recently had to help her more with self care tasks 2/2 increased difficulty along with daughter reporting that patient has requires more assist for functional mobility.  Patient requires min A sup to sit, sit to stand with max A x1 but unable to take steps to [facility] and R knee buckling.  Patient requires mod A for LB ADLs.  She would benefit from additional OT intervention to address functional deficits of ADLs, activity tolerance, bed mobility and ADL transfers  and UE strength.     Recommendations for follow up therapy are one component of a multi-disciplinary discharge planning process, led by the attending physician.  Recommendations may be updated based on patient status, additional functional criteria and insurance authorization.   Follow Up Recommendations  Acute inpatient rehab (3hours/day)    Assistance Recommended at Discharge Frequent or constant Supervision/Assistance  Patient can return home with the following A lot of help with walking and/or transfers;A lot of help with bathing/dressing/bathroom;Direct supervision/assist for medications management;Direct supervision/assist for financial management;Assistance with feeding;Assist for transportation    Functional Status Assessment  Patient has had a recent decline in their functional status and demonstrates the ability to make significant improvements  in function in a reasonable and predictable amount of time.  Equipment Recommendations  Wheelchair (measurements OT)    Recommendations for Other Services Rehab consult     Precautions / Restrictions Precautions Precautions: Fall Restrictions Weight Bearing Restrictions: No      Mobility Bed Mobility Overal bed mobility: Needs Assistance Bed Mobility: Supine to Sit     Supine to sit: Min assist          Transfers Overall transfer level: Needs assistance Equipment used: Rolling walker (2 wheels) Transfers: Sit to/from Stand Sit to Stand: Max assist                  Balance Overall balance assessment: Needs assistance, History of Falls Sitting-balance support: Single extremity supported     Postural control: Right lateral lean Standing balance support: Bilateral upper extremity supported                               ADL either performed or assessed with clinical judgement   ADL Overall ADL's : Needs assistance/impaired Eating/Feeding: Set up   Grooming: Set up   Upper Body Bathing: Minimal assistance   Lower Body Bathing: Maximal assistance   Upper Body Dressing : Minimal assistance   Lower Body Dressing: Moderate assistance Lower Body Dressing Details (indicate cue type and reason): patient able to place LE into figure 4 postion to don/doff sock             Functional mobility during ADLs: Rolling walker (2 wheels);Maximal assistance (patient completed sit to stand from EOB and attempted side step to [facility], R knee buckled and returned to seated postion on bed)  Vision Baseline Vision/History: 1 Wears glasses Ability to See in Adequate Light: 0 Adequate Patient Visual Report: No change from baseline Vision Assessment?: No apparent visual deficits     Perception     Praxis      Pertinent Vitals/Pain Pain Assessment Pain Assessment: No/denies pain     Hand Dominance Right   Extremity/Trunk Assessment Upper  Extremity Assessment Upper Extremity Assessment: Generalized weakness   Lower Extremity Assessment Lower Extremity Assessment: Defer to PT evaluation   Cervical / Trunk Assessment Cervical / Trunk Assessment: Normal   Communication Communication Communication: HOH   Cognition Arousal/Alertness: Awake/alert (easily drifts off to sleep but awakes to name being called) Behavior During Therapy: WFL for tasks assessed/performed Overall Cognitive Status: Within Functional Limits for tasks assessed                                       General Comments       Exercises     Shoulder Instructions      Home Living Family/patient expects to be discharged to:: Private residence Living Arrangements: Children Available Help at Discharge: Family Type of Home: House Home Access: Stairs to enter (7 STE) Entrance Stairs-Number of Steps: 7 Entrance Stairs-Rails: Right;Left Home Layout: Multi-level;Able to live on main level with bedroom/bathroom     Bathroom Shower/Tub: Teacher, early years/pre: Standard Bathroom Accessibility: Yes How Accessible: Accessible via walker Home Equipment: [place] (2 wheels);Cane - single point;Shower seat;BSC/3in1          Prior Functioning/Environment Prior Level of Function : Independent/Modified Independent (patient's daughter reports prior to hospital admission recently, patient was Independent in ADLs although since being home since last DC, patient has required more assist for ADLs and functional mobility with RW)                        OT Problem List: Decreased strength;Decreased activity tolerance;Impaired balance (sitting and/or standing);Decreased safety awareness      OT Treatment/Interventions: Self-care/ADL training;Therapeutic exercise;Neuromuscular education;Energy conservation;DME and/or AE instruction;Therapeutic activities;Balance training;Patient/family education    OT Goals(Current goals  can be found in the care plan section) Acute Rehab OT Goals Patient Stated Goal: to get better OT Goal Formulation: With patient/family Time For Goal Achievement: 07/31/21 Potential to Achieve Goals: Good  OT Frequency: Min 2X/week    Co-evaluation              AM-PAC OT "6 Clicks" Daily Activity     Outcome Measure Help from another person eating meals?: A Little Help from another person taking care of personal grooming?: A Little Help from another person toileting, which includes using toliet, bedpan, or urinal?: A Lot Help from another person bathing (including washing, rinsing, drying)?: A Lot Help from another person to put on and taking off regular upper body clothing?: A Little Help from another person to put on and taking off regular lower body clothing?: A Lot 6 Click Score: 15   End of Session Equipment Utilized During Treatment: Gait belt;Rolling walker (2 wheels) Nurse Communication: Mobility status  Activity Tolerance: Patient limited by fatigue;Patient tolerated treatment well Patient left: in bed;with call bell/phone within reach;with bed alarm set;with family/visitor present  OT Visit Diagnosis: Muscle weakness (generalized) (M62.81)                Time: 1035-1100 OT Time Calculation (min): 25 min Charges:  OT  Evaluation $OT Eval Moderate Complexity: 1 Mod OT Treatments $Self Care/Home Management : 8-22 mins  Mervyn Skeeters OTR/L  Hadley Pen 07/17/2021, 1:28 PM

## 2021-07-17 NOTE — Progress Notes (Signed)
  Echocardiogram 2D Echocardiogram has been performed.  Rebecca Elliott 07/17/2021, 2:29 PM

## 2021-07-17 NOTE — Evaluation (Signed)
Physical Therapy Evaluation Patient Details Name: Rebecca Elliott MRN: [id number] DOB: 1926-04-03 Today's Date: 07/17/2021  History of Present Illness  Patient is a 88 yo woman that presents to the hospital for c/o of LE sweliing and SOB.  Patient Dx with acute on chronic HFpEF exacerbation and R LL PNA.  PMHx includes COPD, chronic HF, chronic hypoxic resp failure 2 liters at home, DM, CKD stage 3, HTN, HLD, COPD, depression and recent hospital stay 6/24-6/26 for hypoglycemia.  Clinical Impression  Pt was seen for evaluation of mobility with her daughter in attendance, who is also a caregiver.  Pt is able to assist getting up to side of bed with upper body lifted herself, and then assist to scoot out to EOB.  Pt is mod assist to stand and sidestep to chair, demonstrating more alertness and energy with the transition.  Follow along with her to get further progression of gait and standing endurance/balance, and will work toward plan for CIR admission with greater time OOB and moving.  Follow acutely for goals of PT.  Pt ultimately has to climb 7 steps to enter her home.      Recommendations for follow up therapy are one component of a multi-disciplinary discharge planning process, led by the attending physician.  Recommendations may be updated based on patient status, additional functional criteria and insurance authorization.  Follow Up Recommendations Acute inpatient rehab (3hours/day)      Assistance Recommended at Discharge Frequent or constant Supervision/Assistance  Patient can return home with the following  A lot of help with walking and/or transfers;A lot of help with bathing/dressing/bathroom;Assistance with cooking/housework;Direct supervision/assist for medications management;Assist for transportation;Help with stairs or ramp for entrance    Equipment Recommendations None recommended by PT  Recommendations for Other Services  Rehab consult    Functional Status Assessment Patient  has had a recent decline in their functional status and demonstrates the ability to make significant improvements in function in a reasonable and predictable amount of time.     Precautions / Restrictions Precautions Precautions: Fall Precaution Comments: lethargic today Restrictions Weight Bearing Restrictions: No      Mobility  Bed Mobility Overal bed mobility: Needs Assistance Bed Mobility: Supine to Sit     Supine to sit: Min assist          Transfers Overall transfer level: Needs assistance Equipment used: Rolling walker (2 wheels) Transfers: Sit to/from Stand Sit to Stand: Mod assist                Ambulation/Gait Ambulation/Gait assistance: Mod assist Gait Distance (Feet): 5 Feet Assistive device: 1 person hand held assist Gait Pattern/deviations: Step-to pattern, Decreased stride length, Wide base of support, Trunk flexed Gait velocity: reduced Gait velocity interpretation: <1.31 ft/sec, indicative of household ambulator   General Gait Details: Sidestepped to chair with pt struggling to fully stand with her walker being missing from room  Stairs            Wheelchair Mobility    Modified Rankin (Stroke Patients Only)       Balance Overall balance assessment: Needs assistance, History of Falls Sitting-balance support: Feet supported Sitting balance-Leahy Scale: Fair   Postural control: Right lateral lean Standing balance support: Bilateral upper extremity supported Standing balance-Leahy Scale: Poor                               Pertinent Vitals/Pain Pain Assessment Pain Assessment: No/denies pain  Home Living Family/patient expects to be discharged to:: Private residence Living Arrangements: Children Available Help at Discharge: Family Type of Home: House Home Access: Stairs to enter Entrance Stairs-Rails: Psychiatric nurse of Steps: 7   Home Layout: Multi-level;Able to live on main level with  bedroom/bathroom Home Equipment: Rolling Walker (2 wheels);Cane - single point;Shower seat;BSC/3in1      Prior Function Prior Level of Function : Independent/Modified Independent             Mobility Comments: walked with RW at home alone       Hand Dominance   Dominant Hand: Right    Extremity/Trunk Assessment   Upper Extremity Assessment Upper Extremity Assessment: Defer to OT evaluation    Lower Extremity Assessment Lower Extremity Assessment: Generalized weakness    Cervical / Trunk Assessment Cervical / Trunk Assessment: Kyphotic (mild)  Communication   Communication: HOH  Cognition Arousal/Alertness: Awake/alert (easily drifts off to sleep but awakes to name being called) Behavior During Therapy: WFL for tasks assessed/performed Overall Cognitive Status: Within Functional Limits for tasks assessed                                          General Comments General comments (skin integrity, edema, etc.): pt has good ability to generate force with UE's to support effort to get off bed, but has hip weakness that makes power up a challenge    Exercises     Assessment/Plan    PT Assessment Patient needs continued PT services  PT Problem List Decreased strength;Decreased balance;Decreased coordination;Decreased knowledge of use of DME;Other (comment) (mildly lethargic)       PT Treatment Interventions DME instruction;Gait training;Stair training;Functional mobility training;Therapeutic activities;Therapeutic exercise;Balance training;Neuromuscular re-education;Patient/family education    PT Goals (Current goals can be found in the Care Plan section)  Acute Rehab PT Goals Patient Stated Goal: none stated PT Goal Formulation: With family Time For Goal Achievement: 07/31/21 Potential to Achieve Goals: Good    Frequency Min 3X/week     Co-evaluation               AM-PAC PT "6 Clicks" Mobility  Outcome Measure Help needed turning  from your back to your side while in a flat bed without using bedrails?: A Lot Help needed moving from lying on your back to sitting on the side of a flat bed without using bedrails?: A Lot Help needed moving to and from a bed to a chair (including a wheelchair)?: A Lot Help needed standing up from a chair using your arms (e.g., wheelchair or bedside chair)?: A Lot Help needed to walk in hospital room?: A Lot Help needed climbing 3-5 steps with a railing? : Total 6 Click Score: 11    End of Session Equipment Utilized During Treatment: Gait belt;Oxygen Activity Tolerance: Patient limited by fatigue;Treatment limited secondary to medical complications (Comment) Patient left: in chair;with call bell/phone within reach;with chair alarm set Nurse Communication: Mobility status PT Visit Diagnosis: Unsteadiness on feet (R26.81);Muscle weakness (generalized) (M62.81);Difficulty in walking, not elsewhere classified (R26.2)    Time: 1129-1203 PT Time Calculation (min) (ACUTE ONLY): 34 min   Charges:   PT Evaluation $PT Eval Moderate Complexity: 1 Mod PT Treatments $Therapeutic Activity: 8-22 mins       Ramond Dial 07/17/2021, 4:01 PM  Mee Hives, PT PhD Acute Rehab Dept. Number: [place] and [place]

## 2021-07-17 NOTE — Progress Notes (Signed)
PROGRESS NOTE        PATIENT DETAILS Name: Rebecca Elliott Age: 88 y.o. Sex: female Date of Birth: 1926-07-27 Admit Date: 07/16/2021 Admitting Physician Evalee Mutton Kristeen Mans, MD PCP:Paz, Alda Berthold, MD  Brief Summary: Patient is a 88 y.o.  female with a history of COPD, chronic HFpEF, chronic hypoxic respiratory failure on 2 L of oxygen at home, DM-2, CKD stage IIIb-who was hospitalized from 6/24-6/26 for hypoglycemia in the setting of AKI-presented to the hospital on 6/29 with SOB/worsening lower extremity edema-found to have aspiration PNA and acute on chronic HFpEF.   Significant events: 6/29>> admit to [facility]-worsening hypoxemia due to aspiration PNA and acute on chronic HFpEF.  Significant studies: 6/29>> CXR: RLL PNA-pulmonary venous congestion  Significant microbiology data: 6/29>> COVID PCR: Negative 6/29>> blood culture: Negative  Procedures:   Consults: None  Subjective: Looks frail-stable on 2 L of oxygen-Per nursing staff-had a choking spell with medications this morning.  Objective: Vitals: Blood pressure (!) 114/43, pulse 74, temperature 99.3 F (37.4 C), temperature source Axillary, resp. rate 20, height '5\' 6"'$  (1.676 m), weight 89.3 kg, SpO2 95 %.   Exam: Gen Exam:Alert awake-not in any distress HEENT:atraumatic, normocephalic Chest: B/L clear to auscultation anteriorly-a few bibasilar rales posteriorly. CVS:S1S2 regular Abdomen:soft non tender, non distended Extremities:+ edema Neurology: Non focal Skin: no rash  Pertinent Labs/Radiology:    Latest Ref Rng & Units 07/17/2021    8:33 AM 07/16/2021    5:50 PM 07/16/2021    1:46 PM  CBC  WBC 4.0 - 10.5 K/uL 25.0  23.9  21.6   Hemoglobin 12.0 - 15.0 g/dL 10.1  9.4  9.9   Hematocrit 36.0 - 46.0 % 31.8  31.3  31.5   Platelets 150 - 400 K/uL 314  303  320     Lab Results  Component Value Date   NA 134 (L) 07/17/2021   K 3.7 07/17/2021   CL 102 07/17/2021   CO2 24 07/17/2021       Assessment/Plan: Acute on chronic hypoxic respiratory failure due to probable aspiration pneumonia and HFpEF with exacerbation: Initially required on 4 L of oxygen when she first presented-now overall improved and back on to her usual regimen of 2 L.  Volume status better but still has significant amount of lower extremity edema-leukocytosis persists-follow cultures-follow volume status closely. Continue Unasyn/Zithromax and IV Lasix.  Minimally elevated troponins: Trend is flat-not consistent with ACS.  Suspect this is due to demand ischemia.  Given advanced age-CKD stage IIIb-reasonable to manage this medically.   DM-2 (A1c 6.0 on 3/14): CBG with borderline hypoglycemia this morning-stop SSI-watch closely.  Not a candidate for aggressive glycemic control.    Recent Labs    07/16/21 2302 07/17/21 0728 07/17/21 1120  GLUCAP 87 70 149*     CKD stage IIIb: At baseline-watch closely-as patient on IV Lasix.  HTN: BP stable-continue metoprolol-we will decrease hydralazine to 25 mg 3 times daily-continue IV furosemide-follow and optimize.  Continue to hold losartan in order to provide more room for diuresis  HLD: Continue statin  GERD: Continue PPI  COPD with chronic hypoxic respiratory failure on 2 L of oxygen (as needed):-Normally uses oxygen as needed-but since her discharge from her most recent hospitalization-she has been on 2 L of oxygen 24/7.  She has no evidence of exacerbation at this point-continue as needed bronchodilators. OA  bilateral knees: Supportive care   Chronic debility/deconditioning: Normally walks around the house with the help of a walker/cane-obtaining PT/OT eval.  Palliative care: Full code for now-daughter aware that given advanced age/frailty-ongoing aspiration issues-her long-term prognosis is not very good.  Awaiting further goals of care discussion with family by the palliative care team.  Obesity: Estimated body mass index is 31.78 kg/m as calculated  from the following:   Height as of this encounter: '5\' 6"'$  (1.676 m).   Weight as of this encounter: 89.3 kg.   Code status:   Code Status: Full Code   DVT Prophylaxis: heparin injection 5,000 Units Start: 07/16/21 1630   Family Communication: Daughter at bedside   Disposition Plan: Status is: Inpatient Remains inpatient appropriate because: Hypoxia due to aspiration PNA/CHF exacerbation-on IV antibiotics-IV Lasix-noted stable for discharge.   Planned Discharge Destination:Home health   Diet: Diet Order             Diet renal/carb modified with fluid restriction Diet-HS Snack? Nothing; Fluid restriction: 1200 mL Fluid; Room service appropriate? Yes; Fluid consistency: Thin  Diet effective now                     Antimicrobial agents: Anti-infectives (From admission, onward)    Start     Dose/Rate Route Frequency Ordered Stop   07/16/21 1645  Ampicillin-Sulbactam (UNASYN) 3 g in sodium chloride 0.9 % 100 mL IVPB        3 g 200 mL/hr over 30 Minutes Intravenous Every 12 hours 07/16/21 1633     07/16/21 1630  azithromycin (ZITHROMAX) 500 mg in sodium chloride 0.9 % 250 mL IVPB        500 mg 250 mL/hr over 60 Minutes Intravenous Every 24 hours 07/16/21 1622 07/19/21 1629        MEDICATIONS: Scheduled Meds:  atorvastatin  10 mg Oral Daily   furosemide  60 mg Intravenous Q12H   heparin  5,000 Units Subcutaneous Q8H   hydrALAZINE  50 mg Oral TID   insulin aspart  0-9 Units Subcutaneous TID WC   loratadine  10 mg Oral Daily   metoprolol succinate  100 mg Oral Daily   multivitamin with minerals  1 tablet Oral Daily   pantoprazole  40 mg Oral Daily   sodium chloride flush  3 mL Intravenous Q12H   Continuous Infusions:  sodium chloride     ampicillin-sulbactam (UNASYN) IV 3 g (07/17/21 0318)   azithromycin Stopped (07/16/21 1955)   PRN Meds:.sodium chloride, acetaminophen **OR** acetaminophen, diclofenac Sodium, hydrALAZINE, ipratropium-albuterol, Muscle Rub,  ondansetron **OR** ondansetron (ZOFRAN) IV, polyethylene glycol, sodium chloride flush   I have personally reviewed following labs and imaging studies  LABORATORY DATA: CBC: Recent Labs  Lab 07/11/21 2255 07/11/21 2317 07/16/21 1346 07/16/21 1750 07/17/21 0833  WBC 8.2  --  21.6* 23.9* 25.0*  NEUTROABS 4.6  --  17.6*  --   --   HGB 10.8* 12.2 9.9* 9.4* 10.1*  HCT 35.5* 36.0 31.5* 31.3* 31.8*  MCV 77.2*  --  75.0* 76.9* 74.6*  PLT 329  --  320 303 789    Basic Metabolic Panel: Recent Labs  Lab 07/11/21 2255 07/11/21 2317 07/12/21 0549 07/13/21 0435 07/16/21 1346 07/16/21 1750 07/17/21 0833  NA 136 137 134* 135 133*  --  134*  K 2.7* 2.6* 3.1* 3.9 4.5  --  3.7  CL 103 100 103 109 102  --  102  CO2 24  --  '23 22 23  '$ --  24  GLUCOSE 77 73 92 101* 114*  --  70  BUN 35* 34* 32* 22 19  --  20  CREATININE 2.41* 2.60* 2.03* 1.47* 1.66* 1.61* 1.75*  CALCIUM 7.6*  --  7.5* 7.8* 8.5*  --  8.3*  MG  --   --   --  2.0  --   --   --     GFR: Estimated Creatinine Clearance: 22.6 mL/min (A) (by C-G formula based on SCr of 1.75 mg/dL (H)).  Liver Function Tests: Recent Labs  Lab 07/11/21 2255 07/12/21 0549 07/16/21 1346  AST 149* 133* 85*  ALT '14 14 14  '$ ALKPHOS 47 47 50  BILITOT 0.2* 0.3 0.4  PROT 6.5 6.2* 6.8  ALBUMIN 2.4* 2.2* 2.4*   No results for input(s): "LIPASE", "AMYLASE" in the last 168 hours. No results for input(s): "AMMONIA" in the last 168 hours.  Coagulation Profile: No results for input(s): "INR", "PROTIME" in the last 168 hours.  Cardiac Enzymes: No results for input(s): "CKTOTAL", "CKMB", "CKMBINDEX", "TROPONINI" in the last 168 hours.  BNP (last 3 results) Recent Labs    11/28/20 1209  PROBNP 788.0*    Lipid Profile: No results for input(s): "CHOL", "HDL", "LDLCALC", "TRIG", "CHOLHDL", "LDLDIRECT" in the last 72 hours.  Thyroid Function Tests: No results for input(s): "TSH", "T4TOTAL", "FREET4", "T3FREE", "THYROIDAB" in the last 72  hours.  Anemia Panel: No results for input(s): "VITAMINB12", "FOLATE", "FERRITIN", "TIBC", "IRON", "RETICCTPCT" in the last 72 hours.  Urine analysis:    Component Value Date/Time   COLORURINE YELLOW 07/12/2021 0431   APPEARANCEUR HAZY (A) 07/12/2021 0431   LABSPEC 1.009 07/12/2021 0431   PHURINE 5.0 07/12/2021 0431   GLUCOSEU NEGATIVE 07/12/2021 0431   GLUCOSEU NEGATIVE 04/13/2019 1049   HGBUR SMALL (A) 07/12/2021 0431   BILIRUBINUR NEGATIVE 07/12/2021 0431   KETONESUR NEGATIVE 07/12/2021 0431   PROTEINUR NEGATIVE 07/12/2021 0431   UROBILINOGEN 0.2 04/13/2019 1049   NITRITE NEGATIVE 07/12/2021 0431   LEUKOCYTESUR LARGE (A) 07/12/2021 0431    Sepsis Labs: Lactic Acid, Venous No results found for: "LATICACIDVEN"  MICROBIOLOGY: Recent Results (from the past 240 hour(s))  Urine Culture     Status: Abnormal   Collection Time: 07/12/21  6:23 AM   Specimen: Urine, Clean Catch  Result Value Ref Range Status   Specimen Description URINE, CLEAN CATCH  Final   Special Requests   Final    NONE Performed at [facility], [place] [address] [place], [place] [address]    Culture >=100,000 COLONIES/mL ESCHERICHIA COLI (A)  Final   Report Status 07/14/2021 FINAL  Final   Organism ID, Bacteria ESCHERICHIA COLI (A)  Final      Susceptibility   Escherichia coli - MIC*    AMPICILLIN >=32 RESISTANT Resistant     CEFAZOLIN 16 SENSITIVE Sensitive     CEFEPIME <=0.12 SENSITIVE Sensitive     CEFTRIAXONE <=0.25 SENSITIVE Sensitive     CIPROFLOXACIN <=0.25 SENSITIVE Sensitive     GENTAMICIN >=16 RESISTANT Resistant     IMIPENEM <=0.25 SENSITIVE Sensitive     NITROFURANTOIN <=16 SENSITIVE Sensitive     TRIMETH/SULFA >=320 RESISTANT Resistant     AMPICILLIN/SULBACTAM >=32 RESISTANT Resistant     PIP/TAZO 64 INTERMEDIATE Intermediate     * >=100,000 COLONIES/mL ESCHERICHIA COLI  SARS Coronavirus 2 by RT PCR (hospital order, performed in [facility] hospital lab) *cepheid single result  test* Anterior Nasal Swab     Status: None   Collection Time: 07/16/21  4:22 PM   Specimen: Anterior Nasal Swab  Result Value Ref Range Status   SARS Coronavirus 2 by RT PCR NEGATIVE NEGATIVE Final    Comment: (NOTE) SARS-CoV-2 target nucleic acids are NOT DETECTED.  The SARS-CoV-2 RNA is generally detectable in upper and lower respiratory specimens during the acute phase of infection. The lowest concentration of SARS-CoV-2 viral copies this assay can detect is 250 copies / mL. A negative result does not preclude SARS-CoV-2 infection and should not be used as the sole basis for treatment or other patient management decisions.  A negative result may occur with improper specimen collection / handling, submission of specimen other than nasopharyngeal swab, presence of viral mutation(s) within the areas targeted by this assay, and inadequate number of viral copies (<250 copies / mL). A negative result must be combined with clinical observations, patient history, and epidemiological information.  Fact Sheet for Patients:   https://www.patel.info/  Fact Sheet for Healthcare Providers: https://hall.com/  This test is not yet approved or  cleared by the [place] FDA and has been authorized for detection and/or diagnosis of SARS-CoV-2 by FDA under an Emergency Use Authorization (EUA).  This EUA will remain in effect (meaning this test can be used) for the duration of the COVID-19 declaration under Section 564(b)(1) of the Act, 21 U.S.C. section 360bbb-3(b)(1), unless the authorization is terminated or revoked sooner.  Performed at [facility], [place] [address], [place], [place] [address]     RADIOLOGY STUDIES/RESULTS: DG Chest Portable 1 View  Result Date: 07/16/2021 CLINICAL DATA:  Shortness of breath EXAM: PORTABLE CHEST 1 VIEW COMPARISON:  11/11/2020 FINDINGS: Patient rotated right. The Chin overlies the apices. Moderate  cardiomegaly. Atherosclerosis in the transverse aorta. Small right and probable trace left pleural effusions. No pneumothorax. Pulmonary interstitial prominence is slightly increased. Right greater than left base airspace disease. Similar on the right and slightly decreased on the left. IMPRESSION: Cardiomegaly with mild pulmonary venous congestion. Right and possible small left pleural effusions. scattered right-greater-than-left basilar airspace disease is most likely atelectasis. At the right lung base, pneumonia or aspiration cannot be excluded. Aortic Atherosclerosis (ICD10-I70.0). Electronically Signed   By: Abigail Miyamoto M.D.   On: 07/16/2021 14:07     LOS: 1 day   Oren Binet, MD  [facility]    To contact the attending provider between 7A-7P or the covering provider during after hours 7P-7A, please log into the web site [email or web address] and access using universal [place] password for that web site. If you do not have the password, please call the hospital operator.  07/17/2021, 1:30 PM

## 2021-07-17 NOTE — TOC Initial Note (Addendum)
Transition of Care [facility]) - Initial/Assessment Note    Patient Details  Name: Rebecca Elliott MRN: [id number] Date of Birth: [birth year]  [facility] CM/SW Contact:    Verdell Carmine, RN Phone Number: 07/17/2021, 11:41 AM  Clinical Narrative:                 Spoke to patient and daughter in room, introduced self and role to discuss discharge planning.Patient has home oxygen at 2L with [employer]. Her daughter says they need to re-look at he set up. Will call [employer] regarding this.  Patient lives with 2 daughters, one works full time during the day. The other just had a hip repair from  fall. The patient has a walker and BSC at home. She has 7 steps to geti nto the house, as well the bathroom is small on the main level, and she has to go upstairs for showers.  Discussed adapter to kitchen sink for hair washing. She may need a wheelchair, but she does not like the idea of having one.  Discussed ramps not  covered by insurance.  Awaiting PT recommendations, Daughter given list for Home Health from Medicare to decide the agency.  CM will follow for needs, recommendations, and transitions.   Expected Discharge Plan: [place] Barriers to Discharge: Continued Medical Work up   Patient Goals and CMS Choice     Choice offered to / list presented to : Patient, Adult Children  Expected Discharge Plan and Services Expected Discharge Plan: [place]   Discharge Planning Services: CM Consult   Living arrangements for the past 2 months: Single Family Home                           HH Arranged: PT, OT, Nurse's Aide          Prior Living Arrangements/Services Living arrangements for the past 2 months: Single Family Home Lives with:: Adult Children Patient language and need for interpreter reviewed:: Yes Do you feel safe going back to the place where you live?: Yes      Need for Family Participation in Patient Care: Yes (Comment) Care giver  support system in place?: Yes (comment) Current home services: DME Criminal Activity/Legal Involvement Pertinent to Current Situation/Hospitalization: No - Comment as needed  Activities of Daily Living Home Assistive Devices/Equipment: [place] Rile (specify type) ADL Screening (condition at time of admission) Patient's cognitive ability adequate to safely complete daily activities?: Yes Is the patient deaf or have difficulty hearing?: Yes Does the patient have difficulty seeing, even when wearing glasses/contacts?: Yes Does the patient have difficulty concentrating, remembering, or making decisions?: No Patient able to express need for assistance with ADLs?: No Does the patient have difficulty dressing or bathing?: No Independently performs ADLs?: Yes (appropriate for developmental age) Does the patient have difficulty walking or climbing stairs?: Yes Weakness of Legs: Both Weakness of Arms/Hands: None  Permission Sought/Granted                  Emotional Assessment   Attitude/Demeanor/Rapport: Gracious Affect (typically observed): Stable Orientation: : Oriented to Self, Oriented to Place, Oriented to  Time Alcohol / Substance Use: Not Applicable Psych Involvement: No (comment)  Admission diagnosis:  PNA (pneumonia) [J18.9] Community acquired pneumonia, unspecified laterality [J18.9] Congestive heart failure, unspecified HF chronicity, unspecified heart failure type [facility]) [I50.9] Patient Active Problem List   Diagnosis Date Noted   PNA (pneumonia) 07/16/2021  Prediabetes 07/13/2021   AKI superimposed on CKD stage IIIb ([place]) 07/12/2021   Hypoglycemia 07/12/2021   Acute respiratory failure with hypoxia ([place]) 11/11/2020   Obesity (BMI 30-39.9) 11/08/2020   CHF (congestive heart failure) ([place]) 11/08/2020   Hypokalemia 11/05/2020   Hypomagnesemia 11/05/2020   SOB (shortness of breath) 11/05/2020   CKD stage IIIb ([place]) 11/05/2020   History of anemia due to CKD 11/05/2020    Macular pucker, right eye 05/01/2020   Asthma 12/06/2019   Early stage nonexudative age-related macular degeneration of both eyes 09/25/2019   Branch retinal vein occlusion with macular edema of left eye 06/12/2019   Retinal telangiectasia of left eye 04/23/2019   Snores 04/23/2019   Primary osteoarthritis of right knee 06/22/2016   PCP NOTES >>>>>>>>>>>>> 11/19/2014   Abdominal lymphadenopathy 08/09/2014   Pulmonary nodules 05/01/2014   Allergic rhinitis 04/18/2014   Tremor 09/26/2013   Elevated LFTs 08/13/2011   Annual physical exam 07/10/2010   Osteoarthritis 07/08/2009   COLONIC POLYPS, ADENOMATOUS, HX OF 08/14/2008   ALLERGIC RHINITIS 05/02/2007   Type 2 diabetes mellitus with diabetic neuropathy, unspecified ([place]) 12/12/2006   Hyperlipidemia 10/10/2006   DEPRESSION 10/10/2006   GERD 10/10/2006   HIATAL HERNIA WITH REFLUX 10/10/2006   Essential hypertension 03/13/2006   COPD  03/13/2006   PCP:  Colon Branch, MD Pharmacy:   [id number] [place] [address] [address]Phone: [phone]: [phone] [facility] [place]Phone: [phone]: [phone]    Social Determinants of Health (SDOH) Interventions    Readmission Risk Interventions    07/17/2021    8:30 AM  Readmission Risk Prevention Plan  Transportation Screening Complete  Home Care Screening Not Complete  Home Care Screening Not Completed Comments awaiting PT OT recommendations  Medication Review (RN CM) Complete

## 2021-07-17 NOTE — Consult Note (Signed)
Palliative Medicine Inpatient Consult Note  Consulting Provider: Jonetta Osgood, MD  Reason for consult:   [facility] Consult  Reason for Consult? Goals of care   07/17/2021  HPI:  Per intake H&P --> Patient is a 88 y.o.  female with a history of COPD, chronic HFpEF, chronic hypoxic respiratory failure on 2 L of oxygen at home, DM-2, CKD stage IIIb-who was hospitalized from 6/24-6/26 for hypoglycemia in the setting of AKI-presented to the hospital on 6/29 with SOB/worsening lower extremity edema-found to have aspiration PNA and acute on chronic HFpEF.  Palliative care has been asked to get involved to further address goals of care in the setting of multiple chronic comorbid conditions and likelihood of poor outcomes with cardiopulmonary resuscitation.  Clinical Assessment/Goals of Care:  *Please note that this is a verbal dictation therefore any spelling or grammatical errors are due to the "[place] [facility]" system interpretation.  I have reviewed medical records including EPIC notes, labs and imaging, received report from bedside RN, assessed the patient.    I met with Rebecca Elliott and her daughter Rebecca Elliott to further discuss diagnosis prognosis, GOC, EOL wishes, disposition and options.   I introduced Palliative Medicine as specialized medical care for people living with serious illness. It focuses on providing relief from the symptoms and stress of a serious illness. The goal is to improve quality of life for both the patient and the family.  Social History:  Rebecca Elliott has lived in [place] for the majority of her life.  She is a widow as her husband passed away 28 years ago of cancer.  She had 5 children, 3 sons and 2 daughters though her 3 sons are deceased.  She shares that 1 died in a car accident 1 died of cancer and 1 died of vascular complications.  She has 1 grandson 1 great grandson and 1 great granddaughter.  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  Rebecca Elliott does not consider herself to have a strong religious faith.  Functional and Nutritional State:  Rebecca Elliott lives at home with her daughters Rebecca Elliott and Rebecca Elliott.  Prior to admission Rebecca Elliott was mobile with a walker.  Her daughter shares that she was able to provide bird baths and dress herself.  She ate very little at home per her daughter inclusive of a breakfast a couple crackers throughout the day and a small dinner.  Rebecca Elliott does share that she has had some difficulty with swallowing of certain textured foods.  Advance Directives:  A detailed discussion was had today regarding advanced directives.  Advance directives are in [place].   Code Status:  Encouraged Rebecca Elliott to consider DNR/DNI status understanding evidenced based poor outcomes in similar hospitalized patient, as the cause of arrest is likely associated with advanced chronic/terminal illness rather than an easily reversible acute cardio-pulmonary event. I explained that DNR/DNI does not change the medical plan and it only comes into effect after a person has arrested (died).  It is a protective measure to keep [place] from harming the patient in their last moments of life. Rebecca Elliott was agreeable to DNR/DNI.  She shares that she would not want prolonged life supportive measures.  Discussion:  Reviewed patient's chronic medical conditions inclusive of diastolic heart failure, COPD for which she is on 2 L of oxygen at home as needed, type 2 diabetes, chronic kidney disease.  We discussed patient's recent hospitalizations in the setting of hypoglycemia from a UTI.  We also discussed patient's pneumonia  which she had 9 months ago.  Rebecca Elliott shares that it is important to her that she improve and recover to the point whereby she can do some of her basic activities of daily living.  [name] expresses that she helps with household chores such as cleaning and enjoys doing the laundry.  We discussed that Rebecca Elliott has a very close  relationship with her grandson and great-grandchildren.  [name]'s family provides her with great joy and are her reason for living.  Reviewed the importance of mobility and nutritional intake.  That he was up in the chair this afternoon and does plan to mobilize more tomorrow.  Discussed the importance of continued conversation with family and their  medical providers regarding overall plan of care and treatment options, ensuring decisions are within the context of the patients values and GOCs.  Decision Maker: Rebecca Elliott, Rebecca Elliott (Daughter): [phone] (Mobile)  SUMMARY OF RECOMMENDATIONS   DNAR/DNI  We will place a gold DNR in patient's chart  Allow time for outcomes  Continue current plan of care - goals are for continued improvement  We will broach the topic of outpatient palliative support tomorrow  Ongoing palliative care support  Code Status/Advance Care Planning: DNAR/DNI  Palliative Prophylaxis:  Aspiration, Bowel Regimen, Delirium Protocol, Frequent Pain Assessment, Oral Care, Palliative Wound Care, and Turn Reposition  Additional Recommendations (Limitations, Scope, Preferences): Continue current plan of care  Psycho-social/Spiritual:  Desire for further Chaplaincy support: No-not overtly religious Additional Recommendations: Reviewed patient's current hospital diagnosis inclusive of diastolic heart failure and pneumonia   Prognosis: Patient has multiple chronic comorbid conditions and has had 2 inpatient hospital stays in the past 6 months.  Elevated 57-month mortality risk.  [name] Mortality Risk   9.82 % (in-hospital risk)   Discharge Planning: Await PT and OT evaluations the discharge plan will most likely be home with home health.  Vitals:   07/17/21 0806 07/17/21 1121  BP: (!) 136/52 (!) 114/43  Pulse:    Resp: 20 20  Temp: 99.4 F (37.4 C) 99.3 F (37.4 C)  SpO2:      Intake/Output Summary (Last 24 hours) at 07/17/2021 1714 Last data filed at  07/17/2021 0800 Gross per 24 hour  Intake 337 ml  Output 225 ml  Net 112 ml   Last Weight  Most recent update: 07/17/2021  5:29 AM    Weight  89.3 kg (196 lb 13.9 oz)            Gen: Elderly Caucasian female in no acute distress HEENT: moist mucous membranes CV: irregular rate and rhythm PULM: On 4 L nasal cannula, breathing heavily, audible wheeze ABD: soft/nontender EXT: Bilateral lower extremity edema Neuro: Alert and oriented x3  PPS: 50-60%   This conversation/these recommendations were discussed with patient primary care team, Dr. Sloan Leiter  Billing based on MDM: High  Problems Addressed: One acute or chronic illness or injury that poses a threat to life or bodily function  Amount and/or Complexity of Data: Category 3:Discussion of management or test interpretation with external physician/other qualified health care professional/appropriate source (not separately reported)  Risks: Decision not to resuscitate or to de-escalate care because of poor prognosis ______________________________________________________ [facility] Team Team Cell Phone: [phone] Please utilize secure chat with additional questions, if there is no response within 30 minutes please call the above phone number  Palliative Medicine Team providers are available by phone from 7am to 7pm daily and can be reached through the team cell phone.  Should this patient  require assistance outside of these hours, please call the patient's attending physician.

## 2021-07-17 NOTE — Care Management (Signed)
    Durable Medical Equipment  (From admission, onward)           Start     Ordered   07/17/21 1147  For home use only DME lightweight manual wheelchair with seat cushion  Once       Comments: Patient suffers from general weakness which impairs their ability to perform daily activities like toileting in the home.  A walker will not resolve  issue with performing activities of daily living. A wheelchair will allow patient to safely perform daily activities. Patient is not able to propel themselves in the home using a standard weight wheelchair due to arm weakness. Patient can self propel in the lightweight wheelchair. Length of need Lifetime. Accessories: elevating leg rests (ELRs), wheel locks, extensions and anti-tippers.   07/17/21 1147

## 2021-07-17 NOTE — Progress Notes (Signed)
Heart Failure Navigator Progress Note  Assessed for Heart & Vascular TOC clinic readiness.  Patient does not meet criteria due to HFpEF, admission more aspirational Pneumonia related.   Navigator available for reassessment of patient.   Earnestine Leys, BSN, Clinical cytogeneticist Only

## 2021-07-17 NOTE — Progress Notes (Signed)
SLP Cancellation Note  Patient Details Name: Rebecca Elliott MRN: [id number] DOB: 1926-10-19   Cancelled treatment:       Reason Eval/Treat Not Completed: Patient at procedure or test/unavailable (Pt having echo at this time. SLP will follow up.)  [name] I. Hardin Negus, [place], [place] Office number [phone] Pager [place] 07/17/2021, 1:28 PM

## 2021-07-17 NOTE — Progress Notes (Signed)
SLP Cancellation Note  Patient Details Name: Rebecca Elliott MRN: [id number] DOB: Oct 06, 1926   Cancelled treatment:       Reason Eval/Treat Not Completed: Patient at procedure or test/unavailable (Pt on the bedpan at this time. SLP will follow up later as schedule allows.)  Sarann Tregre I. Hardin Negus, [place], [place] Office number [phone] Pager [place] 07/17/2021, 4:24 PM

## 2021-07-17 NOTE — Evaluation (Signed)
Clinical/Bedside Swallow Evaluation Patient Details  Name: ZAIA CARRE MRN: [id number] Date of Birth: 02-Mar-1926  Today's Date: 07/17/2021 Time: SLP Start Time (ACUTE ONLY): 19 SLP Stop Time (ACUTE ONLY): 1651 SLP Time Calculation (min) (ACUTE ONLY): 16 min  Past Medical History:  Past Medical History:  Diagnosis Date   Allergic rhinitis    COPD  and ASTHMA    Depression    Diabetes mellitus    as an adult   DJD (degenerative joint disease)    GERD (gastroesophageal reflux disease)    and HH w/ reflux   History of colonic polyps    Hyperlipidemia    Hypertension    Past Surgical History:  Past Surgical History:  Procedure Laterality Date   ABDOMINAL HYSTERECTOMY     apparently no oophorectomy   APPENDECTOMY     bilateral breast tumor     BREAST EXCISIONAL BIOPSY Bilateral    one in her 20's and one in her 13's, both benign   CATARACT EXTRACTION Bilateral    Groat   CATARACT EXTRACTION, BILATERAL     CHOLECYSTECTOMY     TONSILLECTOMY     TUBAL LIGATION     HPI:  Pt is a 88 y.o. female who presented to the ED with worsening lower extremity swelling and shortness of breath. On admission, pt's daughter reported that she has noticed the pt coughing with meals. CXR on admission: scattered right-greater-than-left basilar airspace disease is most likely atelectasis. At the right lung base, pneumonia or aspiration cannot be excluded. PMH: COPD, chronic HFpEF, chronic hypoxic respiratory failure on home O2 2 L (as needed), DM-2, CKD stage IIIb who was recently hospitalized from 6/24-6/26 for hypoglycemia in the setting of AKI    Assessment / Plan / Recommendation  Clinical Impression  Pt was seen for bedside swallow evaluation with her daughter present. Both parties reported that the pt has been demonstrating coughing with intake of solids and that she exhibited "choking" with pills this morning. Per the family, pt typically takes multiple pills whole with liquids and does not  have difficulty. Pt reported "sticking" with solids and pt's daughter stated that dry foods appear to be more difficult; pt identified the pharynx/sternal notch as the approximate site of the sensation. Oral mechanism exam was [facility] for strength and ROM. White coating was noted on oral mucosa suggesting possible thrush; RN in room and advised of this. Pt was edentulous and per the daughter, pt only uses dentures for meals if she is at [employer]. Pt exhibited prolonged mastication, but her swallow mechanism otherwise appeared to be [facility] and she denied globus sensation with trials. Pt was dyspneic throughout the evaluation, but tolerated solids and liquids without overt s/sx of aspiration. Pt was educated regarding swallowing physiology and her increased aspiration risk with SOB due to potential difficulty coordinating respiration with swallowing. She verbalized agreement with related swallowing precautions. A dysphagia 3 diet with thin liquids is recommended at this time. SLP will follow to assess diet tolerance and to determine need for instrumental assessment. SLP Visit Diagnosis: Dysphagia, unspecified (R13.10)    Aspiration Risk  Mild aspiration risk    Diet Recommendation Dysphagia 3 (Mech soft);Thin liquid   Liquid Administration via: Cup;Straw Medication Administration: Whole meds with puree Supervision: Staff to assist with self feeding;Intermittent supervision to cue for compensatory strategies Compensations: Slow rate;Small sips/bites (take rest breaks if dyspneic) Postural Changes: Seated upright at 90 degrees    Other  Recommendations Oral Care Recommendations: Oral care BID  Recommendations for follow up therapy are one component of a multi-disciplinary discharge planning process, led by the attending physician.  Recommendations may be updated based on patient status, additional functional criteria and insurance authorization.  Follow up Recommendations  (TBD)      Assistance  Recommended at Discharge    Functional Status Assessment Patient has had a recent decline in their functional status and demonstrates the ability to make significant improvements in function in a reasonable and predictable amount of time.  Frequency and Duration min 2x/week  2 weeks       Prognosis Prognosis for Safe Diet Advancement: Good      Swallow Study   General Date of Onset: 07/16/21 HPI: Pt is a 88 y.o. female who presented to the ED with worsening lower extremity swelling and shortness of breath. On admission, pt's daughter reported that she has noticed the pt coughing with meals. CXR on admission: scattered right-greater-than-left basilar airspace disease is most likely atelectasis. At the right lung base, pneumonia or aspiration cannot be excluded. PMH: COPD, chronic HFpEF, chronic hypoxic respiratory failure on home O2 2 L (as needed), DM-2, CKD stage IIIb who was recently hospitalized from 6/24-6/26 for hypoglycemia in the setting of AKI Type of Study: Bedside Swallow Evaluation Previous Swallow Assessment: none Diet Prior to this Study: Regular;Thin liquids Temperature Spikes Noted: No Respiratory Status: Nasal cannula History of Recent Intubation: No Behavior/Cognition: Alert;Cooperative;Pleasant mood Oral Cavity Assessment: Within Functional Limits Oral Care Completed by SLP: No Oral Cavity - Dentition: Edentulous Vision: Functional for self-feeding Self-Feeding Abilities: Needs assist Patient Positioning: Upright in bed;Postural control adequate for testing Baseline Vocal Quality: Normal Volitional Cough: Strong Volitional Swallow: Able to elicit    Oral/Motor/Sensory Function Overall Oral Motor/Sensory Function: Within functional limits   Ice Chips Ice chips: Within functional limits Presentation: Spoon   Thin Liquid Thin Liquid: Within functional limits Presentation: Straw;Cup    Nectar Thick Nectar Thick Liquid: Not tested   Honey Thick Honey Thick Liquid:  Not tested   Puree Puree: Within functional limits Presentation: Spoon   Solid     Solid: Impaired Presentation: Self Fed Oral Phase Impairments: Impaired mastication     Driana Dazey I. Hardin Negus, [place], [place] Office number [phone] Pager [place] 07/17/2021,5:02 PM

## 2021-07-18 ENCOUNTER — Inpatient Hospital Stay (HOSPITAL_COMMUNITY): Payer: Medicare Other

## 2021-07-18 DIAGNOSIS — Z515 Encounter for palliative care: Secondary | ICD-10-CM | POA: Diagnosis not present

## 2021-07-18 DIAGNOSIS — E1140 Type 2 diabetes mellitus with diabetic neuropathy, unspecified: Secondary | ICD-10-CM | POA: Diagnosis not present

## 2021-07-18 DIAGNOSIS — N1832 Chronic kidney disease, stage 3b: Secondary | ICD-10-CM | POA: Diagnosis not present

## 2021-07-18 DIAGNOSIS — J411 Mucopurulent chronic bronchitis: Secondary | ICD-10-CM | POA: Diagnosis not present

## 2021-07-18 LAB — BASIC METABOLIC PANEL
Anion gap: 9 (ref 5–15)
BUN: 24 mg/dL — ABNORMAL HIGH (ref 8–23)
CO2: 24 mmol/L (ref 22–32)
Calcium: 8 mg/dL — ABNORMAL LOW (ref 8.9–10.3)
Chloride: 100 mmol/L (ref 98–111)
Creatinine, Ser: 1.9 mg/dL — ABNORMAL HIGH (ref 0.44–1.00)
GFR, Estimated: 24 mL/min — ABNORMAL LOW (ref >60)
Glucose, Bld: 100 mg/dL — ABNORMAL HIGH (ref 70–99)
Potassium: 3.1 mmol/L — ABNORMAL LOW (ref 3.5–5.1)
Sodium: 133 mmol/L — ABNORMAL LOW (ref 135–145)

## 2021-07-18 LAB — GLUCOSE, CAPILLARY
Glucose-Capillary: 169 mg/dL — ABNORMAL HIGH (ref 70–99)
Glucose-Capillary: 163 mg/dL — ABNORMAL HIGH (ref 70–99)
Glucose-Capillary: 238 mg/dL — ABNORMAL HIGH (ref 70–99)
Glucose-Capillary: 110 mg/dL — ABNORMAL HIGH (ref 70–99)

## 2021-07-18 MED ORDER — POTASSIUM CHLORIDE CRYS ER 20 MEQ PO TBCR: 40.0000 meq | EXTENDED_RELEASE_TABLET | Freq: Once | ORAL | Status: DC

## 2021-07-18 MED ORDER — FUROSEMIDE 40 MG PO TABS
40.0000 mg | ORAL_TABLET | Freq: Every day | ORAL | Status: DC
  Administered 2021-07-18 – 2021-07-19 (×2): 40 mg via ORAL
  Filled 2021-07-18 (×2): qty 1

## 2021-07-18 MED ORDER — MELATONIN 5 MG PO TABS
10.0000 mg | ORAL_TABLET | Freq: Every day | ORAL | Status: DC
  Administered 2021-07-18 – 2021-07-23 (×6): 10 mg via ORAL
  Filled 2021-07-18 (×6): qty 2

## 2021-07-18 MED ORDER — POTASSIUM CHLORIDE 20 MEQ PO PACK
40.0000 meq | PACK | Freq: Once | ORAL | Status: AC
  Administered 2021-07-18: 40 meq via ORAL
  Filled 2021-07-18: qty 2

## 2021-07-18 MED ORDER — LOPERAMIDE HCL 2 MG PO CAPS
2.0000 mg | ORAL_CAPSULE | ORAL | Status: DC | PRN
  Administered 2021-07-18 – 2021-07-26 (×12): 2 mg via ORAL
  Filled 2021-07-18 (×12): qty 1

## 2021-07-18 NOTE — Progress Notes (Signed)
Inpatient Rehab Admissions Coordinator Note:   Per therapy patient was screened for CIR candidacy by Austin Herd Danford Bad, CCC-SLP. At this time, pt appears to be a potential candidate for CIR. I will place an order for rehab consult for full assessment, per our protocol.  Please contact me any with questions.Gayland Curry, [place], [place] Admissions Coordinator [phone] 07/18/21 2:19 PM

## 2021-07-18 NOTE — Progress Notes (Addendum)
PROGRESS NOTE        PATIENT DETAILS Name: Rebecca Elliott Age: 88 y.o. Sex: female Date of Birth: March 25, 1926 Admit Date: 07/16/2021 Admitting Physician Evalee Mutton Kristeen Mans, MD PCP:Paz, Alda Berthold, MD  Brief Summary: Patient is a 88 y.o.  female with a history of COPD, chronic HFpEF, chronic hypoxic respiratory failure on 2 L of oxygen at home, DM-2, CKD stage IIIb-who was hospitalized from 6/24-6/26 for hypoglycemia in the setting of AKI-presented to the hospital on 6/29 with SOB/worsening lower extremity edema-found to have aspiration PNA and acute on chronic HFpEF.   Significant events: 6/29>> admit to [facility]-worsening hypoxemia due to aspiration PNA and acute on chronic HFpEF.  Significant studies: 6/29>> CXR: RLL PNA-pulmonary venous congestion 6/30>> Echo: EF 78-93%, grade 2 diastolic dysfunction, positive wall motion abnormalities.  Significant microbiology data: 6/29>> COVID PCR: Negative 6/29>> blood culture: Negative  Procedures:   Consults: None  Subjective: Looks frail-stable on 2 L of oxygen-Per nursing staff-had a choking spell with medications this morning.  Objective: Vitals: Blood pressure 139/71, pulse 83, temperature 98.3 F (36.8 C), temperature source Oral, resp. rate (!) 26, height '5\' 6"'$  (1.676 m), weight 89.9 kg, SpO2 93 %.   Exam: Gen Exam:Alert awake-not in any distress HEENT:atraumatic, normocephalic Chest: B/L clear to auscultation anteriorly CVS:S1S2 regular Abdomen:soft non tender, non distended Extremities:no edema Neurology: Non focal Skin: no rash   Pertinent Labs/Radiology:    Latest Ref Rng & Units 07/17/2021    2:43 PM 07/17/2021    8:33 AM 07/16/2021    5:50 PM  CBC  WBC 4.0 - 10.5 K/uL 19.8  25.0  23.9   Hemoglobin 12.0 - 15.0 g/dL 9.2  10.1  9.4   Hematocrit 36.0 - 46.0 % 29.2  31.8  31.3   Platelets 150 - 400 K/uL 284  314  303     Lab Results  Component Value Date   NA 133 (L) 07/18/2021   K 3.1 (L)  07/18/2021   CL 100 07/18/2021   CO2 24 07/18/2021      Assessment/Plan: Acute on chronic hypoxic respiratory failure due to probable aspiration pneumonia and HFpEF with exacerbation: Overall improved-volume status much better-remains on Unasyn/Zithromax.  Stable on just 2 L of oxygen.  Slight bump in creatinine today-we will switch to oral furosemide.  Encourage incentive spirometry/out of bed to chair/mobilization.  Minimally elevated troponins: Trend is flat-not consistent with ACS.  Suspect this is due to demand ischemia.  Echo with stable EF but has some wall motion abnormalities-given advanced age-CKD stage IIIb-reasonable to manage this medically.   DM-2 (A1c 6.0 on 3/14): CBG with borderline hypoglycemia this morning-stop SSI-watch closely.  Not a candidate for aggressive glycemic control.    Recent Labs    07/17/21 1716 07/17/21 2020 07/18/21 0830  GLUCAP 138* 141* 169*    Hypokalemia: Replete and recheck.  CKD stage IIIb: Slight bump in creatinine but still not far from baseline-switching to oral diuretics today.  HTN: BP stable-metoprolol/hydralazine-losartan on hold to allow room for diuresis.    HLD: Continue statin  GERD: Continue PPI  COPD with chronic hypoxic respiratory failure on 2 L of oxygen (as needed):-Normally uses oxygen as needed-but since her discharge from her most recent hospitalization-she has been on 2 L of oxygen 24/7.  She has no evidence of exacerbation at this point-continue as needed bronchodilators.  OA bilateral knees: Supportive care   Chronic debility/deconditioning: Normally walks around the house with the help of a walker/cane-obtaining PT/OT eval.  Palliative care: DNR for now-family aware of poor overall long-term prognosis given development of aspiration pneumonia.  Plan is to diuresis much as possible-continue IV antibiotics and see how much she will improve.  Palliative care following.  Obesity: Estimated body mass index is 31.99  kg/m as calculated from the following:   Height as of this encounter: '5\' 6"'$  (1.676 m).   Weight as of this encounter: 89.9 kg.   Code status:   Code Status: DNR   DVT Prophylaxis: heparin injection 5,000 Units Start: 07/16/21 1630   Family Communication: Daughter at bedside   Disposition Plan: Status is: Inpatient Remains inpatient appropriate because: Hypoxia due to aspiration PNA/CHF exacerbation-on IV antibiotics   Planned Discharge Destination:Home health   Diet: Diet Order             DIET DYS 3 Room service appropriate? Yes with Assist; Fluid consistency: Thin; Fluid restriction: 1200 mL Fluid  Diet effective now                     Antimicrobial agents: Anti-infectives (From admission, onward)    Start     Dose/Rate Route Frequency Ordered Stop   07/16/21 1645  Ampicillin-Sulbactam (UNASYN) 3 g in sodium chloride 0.9 % 100 mL IVPB        3 g 200 mL/hr over 30 Minutes Intravenous Every 12 hours 07/16/21 1633     07/16/21 1630  azithromycin (ZITHROMAX) 500 mg in sodium chloride 0.9 % 250 mL IVPB        500 mg 250 mL/hr over 60 Minutes Intravenous Every 24 hours 07/16/21 1622 07/19/21 1629        MEDICATIONS: Scheduled Meds:  arformoterol  15 mcg Nebulization BID   atorvastatin  10 mg Oral Daily   budesonide (PULMICORT) nebulizer solution  0.25 mg Nebulization BID   guaiFENesin  600 mg Oral BID   heparin  5,000 Units Subcutaneous Q8H   hydrALAZINE  25 mg Oral TID   loratadine  10 mg Oral Daily   metoprolol succinate  100 mg Oral Daily   multivitamin with minerals  1 tablet Oral Daily   pantoprazole  40 mg Oral Daily   revefenacin  175 mcg Nebulization Daily   sodium chloride flush  3 mL Intravenous Q12H   Continuous Infusions:  sodium chloride     ampicillin-sulbactam (UNASYN) IV 3 g (07/18/21 0401)   azithromycin 500 mg (07/17/21 1752)   PRN Meds:.sodium chloride, acetaminophen **OR** acetaminophen, diclofenac Sodium, hydrALAZINE,  ipratropium-albuterol, Muscle Rub, ondansetron **OR** ondansetron (ZOFRAN) IV, polyethylene glycol, sodium chloride flush   I have personally reviewed following labs and imaging studies  LABORATORY DATA: CBC: Recent Labs  Lab 07/11/21 2255 07/11/21 2317 07/16/21 1346 07/16/21 1750 07/17/21 0833 07/17/21 1443  WBC 8.2  --  21.6* 23.9* 25.0* 19.8*  NEUTROABS 4.6  --  17.6*  --   --  16.2*  HGB 10.8* 12.2 9.9* 9.4* 10.1* 9.2*  HCT 35.5* 36.0 31.5* 31.3* 31.8* 29.2*  MCV 77.2*  --  75.0* 76.9* 74.6* 75.1*  PLT 329  --  320 303 314 284     Basic Metabolic Panel: Recent Labs  Lab 07/12/21 0549 07/13/21 0435 07/16/21 1346 07/16/21 1750 07/17/21 0833 07/18/21 0158  NA 134* 135 133*  --  134* 133*  K 3.1* 3.9 4.5  --  3.7 3.1*  CL 103 109 102  --  102 100  CO2 '23 22 23  '$ --  24 24  GLUCOSE 92 101* 114*  --  70 100*  BUN 32* 22 19  --  20 24*  CREATININE 2.03* 1.47* 1.66* 1.61* 1.75* 1.90*  CALCIUM 7.5* 7.8* 8.5*  --  8.3* 8.0*  MG  --  2.0  --   --   --   --      GFR: Estimated Creatinine Clearance: 20.9 mL/min (A) (by C-G formula based on SCr of 1.9 mg/dL (H)).  Liver Function Tests: Recent Labs  Lab 07/11/21 2255 07/12/21 0549 07/16/21 1346  AST 149* 133* 85*  ALT '14 14 14  '$ ALKPHOS 47 47 50  BILITOT 0.2* 0.3 0.4  PROT 6.5 6.2* 6.8  ALBUMIN 2.4* 2.2* 2.4*    No results for input(s): "LIPASE", "AMYLASE" in the last 168 hours. No results for input(s): "AMMONIA" in the last 168 hours.  Coagulation Profile: No results for input(s): "INR", "PROTIME" in the last 168 hours.  Cardiac Enzymes: No results for input(s): "CKTOTAL", "CKMB", "CKMBINDEX", "TROPONINI" in the last 168 hours.  BNP (last 3 results) Recent Labs    11/28/20 1209  PROBNP 788.0*     Lipid Profile: No results for input(s): "CHOL", "HDL", "LDLCALC", "TRIG", "CHOLHDL", "LDLDIRECT" in the last 72 hours.  Thyroid Function Tests: No results for input(s): "TSH", "T4TOTAL", "FREET4",  "T3FREE", "THYROIDAB" in the last 72 hours.  Anemia Panel: No results for input(s): "VITAMINB12", "FOLATE", "FERRITIN", "TIBC", "IRON", "RETICCTPCT" in the last 72 hours.  Urine analysis:    Component Value Date/Time   COLORURINE YELLOW 07/12/2021 0431   APPEARANCEUR HAZY (A) 07/12/2021 0431   LABSPEC 1.009 07/12/2021 0431   PHURINE 5.0 07/12/2021 0431   GLUCOSEU NEGATIVE 07/12/2021 0431   GLUCOSEU NEGATIVE 04/13/2019 1049   HGBUR SMALL (A) 07/12/2021 0431   BILIRUBINUR NEGATIVE 07/12/2021 0431   KETONESUR NEGATIVE 07/12/2021 0431   PROTEINUR NEGATIVE 07/12/2021 0431   UROBILINOGEN 0.2 04/13/2019 1049   NITRITE NEGATIVE 07/12/2021 0431   LEUKOCYTESUR LARGE (A) 07/12/2021 0431    Sepsis Labs: Lactic Acid, Venous No results found for: "LATICACIDVEN"  MICROBIOLOGY: Recent Results (from the past 240 hour(s))  Urine Culture     Status: Abnormal   Collection Time: 07/12/21  6:23 AM   Specimen: Urine, Clean Catch  Result Value Ref Range Status   Specimen Description URINE, CLEAN CATCH  Final   Special Requests   Final    NONE Performed at [facility], [place] [address] [place], [place] [address]    Culture >=100,000 COLONIES/mL ESCHERICHIA COLI (A)  Final   Report Status 07/14/2021 FINAL  Final   Organism ID, Bacteria ESCHERICHIA COLI (A)  Final      Susceptibility   Escherichia coli - MIC*    AMPICILLIN >=32 RESISTANT Resistant     CEFAZOLIN 16 SENSITIVE Sensitive     CEFEPIME <=0.12 SENSITIVE Sensitive     CEFTRIAXONE <=0.25 SENSITIVE Sensitive     CIPROFLOXACIN <=0.25 SENSITIVE Sensitive     GENTAMICIN >=16 RESISTANT Resistant     IMIPENEM <=0.25 SENSITIVE Sensitive     NITROFURANTOIN <=16 SENSITIVE Sensitive     TRIMETH/SULFA >=320 RESISTANT Resistant     AMPICILLIN/SULBACTAM >=32 RESISTANT Resistant     PIP/TAZO 64 INTERMEDIATE Intermediate     * >=100,000 COLONIES/mL ESCHERICHIA COLI  SARS Coronavirus 2 by RT PCR (hospital order, performed in [facility]  hospital lab) *cepheid single result test* Anterior Nasal  Swab     Status: None   Collection Time: 07/16/21  4:22 PM   Specimen: Anterior Nasal Swab  Result Value Ref Range Status   SARS Coronavirus 2 by RT PCR NEGATIVE NEGATIVE Final    Comment: (NOTE) SARS-CoV-2 target nucleic acids are NOT DETECTED.  The SARS-CoV-2 RNA is generally detectable in upper and lower respiratory specimens during the acute phase of infection. The lowest concentration of SARS-CoV-2 viral copies this assay can detect is 250 copies / mL. A negative result does not preclude SARS-CoV-2 infection and should not be used as the sole basis for treatment or other patient management decisions.  A negative result may occur with improper specimen collection / handling, submission of specimen other than nasopharyngeal swab, presence of viral mutation(s) within the areas targeted by this assay, and inadequate number of viral copies (<250 copies / mL). A negative result must be combined with clinical observations, patient history, and epidemiological information.  Fact Sheet for Patients:   https://www.patel.info/  Fact Sheet for Healthcare Providers: https://hall.com/  This test is not yet approved or  cleared by the [place] FDA and has been authorized for detection and/or diagnosis of SARS-CoV-2 by FDA under an Emergency Use Authorization (EUA).  This EUA will remain in effect (meaning this test can be used) for the duration of the COVID-19 declaration under Section 564(b)(1) of the Act, 21 U.S.C. section 360bbb-3(b)(1), unless the authorization is terminated or revoked sooner.  Performed at [facility], [place] [address], [place], [place] [address]   Culture, blood (routine x 2) Call MD if unable to obtain prior to antibiotics being given     Status: None (Preliminary result)   Collection Time: 07/16/21  5:35 PM   Specimen: BLOOD RIGHT HAND  Result Value Ref  Range Status   Specimen Description BLOOD RIGHT HAND  Final   Special Requests   Final    BOTTLES DRAWN AEROBIC ONLY Blood Culture results may not be optimal due to an inadequate volume of blood received in culture bottles   Culture   Final    NO GROWTH 2 DAYS Performed at [facility], [place] [address] [place], [place] [address]    Report Status PENDING  Incomplete    RADIOLOGY STUDIES/RESULTS: DG Chest Port 1V same Day  Result Date: 07/18/2021 CLINICAL DATA:  Shortness of breath, history diabetes mellitus, hypertension, COPD, asthma EXAM: PORTABLE CHEST 1 VIEW COMPARISON:  Portable exam 0823 hours compared to 07/16/2021 FINDINGS: Minimal enlargement of cardiac silhouette. Mediastinal contours and pulmonary vascularity normal. Atherosclerotic calcification aorta. Chronic accentuation of interstitial markings, increased in the mid to lower lungs, question edema versus infection. Probable small bibasilar pleural effusions. No pneumothorax. IMPRESSION: Increased interstitial infiltrates question edema versus infection, greater at bases. Probable small bibasilar pleural effusions. Electronically Signed   By: Lavonia Dana M.D.   On: 07/18/2021 09:27   ECHOCARDIOGRAM COMPLETE  Result Date: 07/17/2021    ECHOCARDIOGRAM REPORT   Patient Name:   Rebecca Elliott Date of Exam: 07/17/2021 Medical Rec #:  [id number]      Height:       66.0 in Accession #:    [id number]     Weight:       196.9 lb Date of Birth:  1927-01-14      BSA:          1.986 m Patient Age:    65 years       BP:  132/53 mmHg Patient Gender: F              HR:           70 bpm. Exam Location:  Inpatient Procedure: 2D Echo, Cardiac Doppler and Color Doppler Indications:    CHF-Acute Diastolic F75.10  History:        Patient has prior history of Echocardiogram examinations, most                 recent 11/06/2020. COPD; Risk Factors:Hypertension,                 Dyslipidemia, Diabetes and GERD.  Sonographer:    Bernadene Person RDCS  Referring Phys: [place]  1. Left ventricular ejection fraction, by estimation, is 55 to 60%. The left ventricle has normal function. The left ventricle demonstrates regional wall motion abnormalities (see scoring diagram/findings for description). There is mild asymmetric left ventricular hypertrophy of the basal and septal segments. Left ventricular diastolic parameters are consistent with Grade II diastolic dysfunction (pseudonormalization). Elevated left ventricular end-diastolic pressure.  2. Right ventricular systolic function is normal. The right ventricular size is normal.  3. Left atrial size was mildly dilated.  4. The mitral valve is abnormal. Trivial mitral valve regurgitation. No evidence of mitral stenosis.  5. The aortic valve is tricuspid. There is moderate calcification of the aortic valve. There is moderate thickening of the aortic valve. Aortic valve regurgitation is mild. Mild to moderate aortic valve stenosis.  6. The inferior vena cava is normal in size with greater than 50% respiratory variability, suggesting right atrial pressure of 3 mmHg. FINDINGS  Left Ventricle: Small area of inferior basal akinesis. Left ventricular ejection fraction, by estimation, is 55 to 60%. The left ventricle has normal function. The left ventricle demonstrates regional wall motion abnormalities. The left ventricular internal cavity size was normal in size. There is mild asymmetric left ventricular hypertrophy of the basal and septal segments. Left ventricular diastolic parameters are consistent with Grade II diastolic dysfunction (pseudonormalization). Elevated left  ventricular end-diastolic pressure. Right Ventricle: The right ventricular size is normal. No increase in right ventricular wall thickness. Right ventricular systolic function is normal. Left Atrium: Left atrial size was mildly dilated. Right Atrium: Right atrial size was normal in size. Pericardium: There is no evidence of  pericardial effusion. Mitral Valve: The mitral valve is abnormal. There is mild thickening of the mitral valve leaflet(s). There is mild calcification of the mitral valve leaflet(s). Mild mitral annular calcification. Trivial mitral valve regurgitation. No evidence of mitral valve stenosis. Tricuspid Valve: The tricuspid valve is normal in structure. Tricuspid valve regurgitation is mild . No evidence of tricuspid stenosis. Aortic Valve: The aortic valve is tricuspid. There is moderate calcification of the aortic valve. There is moderate thickening of the aortic valve. Aortic valve regurgitation is mild. Aortic regurgitation PHT measures 381 msec. Mild to moderate aortic stenosis is present. Aortic valve mean gradient measures 17.7 mmHg. Aortic valve peak gradient measures 27.6 mmHg. Aortic valve area, by VTI measures 1.49 cm. Pulmonic Valve: The pulmonic valve was normal in structure. Pulmonic valve regurgitation is not visualized. No evidence of pulmonic stenosis. Aorta: The aortic root is normal in size and structure. Venous: The inferior vena cava is normal in size with greater than 50% respiratory variability, suggesting right atrial pressure of 3 mmHg. IAS/Shunts: No atrial level shunt detected by color flow Doppler.  LEFT VENTRICLE PLAX 2D LVIDd:  4.40 cm      Diastology LVIDs:         3.40 cm      LV e' medial:    4.95 cm/s LV PW:         1.30 cm      LV E/e' medial:  27.5 LV IVS:        1.20 cm      LV e' lateral:   10.10 cm/s LVOT diam:     2.20 cm      LV E/e' lateral: 13.5 LV SV:         85 LV SV Index:   43 LVOT Area:     3.80 cm  LV Volumes (MOD) LV vol d, MOD A2C: 106.0 ml LV vol d, MOD A4C: 121.0 ml LV vol s, MOD A2C: 48.4 ml LV vol s, MOD A4C: 62.5 ml LV SV MOD A2C:     57.6 ml LV SV MOD A4C:     121.0 ml LV SV MOD BP:      59.0 ml RIGHT VENTRICLE RV S prime:     13.10 cm/s TAPSE (M-mode): 2.2 cm LEFT ATRIUM             Index        RIGHT ATRIUM           Index LA diam:        4.10 cm 2.06  cm/m   RA Area:     16.10 cm LA Vol (A2C):   96.7 ml 48.68 ml/m  RA Volume:   38.60 ml  19.43 ml/m LA Vol (A4C):   88.4 ml 44.50 ml/m LA Biplane Vol: 96.9 ml 48.78 ml/m  AORTIC VALVE AV Area (Vmax):    1.61 cm AV Area (Vmean):   1.44 cm AV Area (VTI):     1.49 cm AV Vmax:           262.67 cm/s AV Vmean:          201.000 cm/s AV VTI:            0.570 m AV Peak Grad:      27.6 mmHg AV Mean Grad:      17.7 mmHg LVOT Vmax:         111.00 cm/s LVOT Vmean:        76.000 cm/s LVOT VTI:          0.224 m LVOT/AV VTI ratio: 0.39 AI PHT:            381 msec  AORTA Ao Root diam: 3.50 cm Ao Asc diam:  3.70 cm MITRAL VALVE MV Area (PHT): 4.06 cm     SHUNTS MV Decel Time: 187 msec     Systemic VTI:  0.22 m MV E velocity: 136.00 cm/s  Systemic Diam: 2.20 cm MV A velocity: 99.40 cm/s MV E/A ratio:  1.37 Jenkins Rouge MD Electronically signed by Jenkins Rouge MD Signature Date/Time: 07/17/2021/2:48:53 PM    Final    DG Chest Portable 1 View  Result Date: 07/16/2021 CLINICAL DATA:  Shortness of breath EXAM: PORTABLE CHEST 1 VIEW COMPARISON:  11/11/2020 FINDINGS: Patient rotated right. The Chin overlies the apices. Moderate cardiomegaly. Atherosclerosis in the transverse aorta. Small right and probable trace left pleural effusions. No pneumothorax. Pulmonary interstitial prominence is slightly increased. Right greater than left base airspace disease. Similar on the right and slightly decreased on the left. IMPRESSION: Cardiomegaly with mild pulmonary venous congestion. Right and possible small left pleural effusions.  scattered right-greater-than-left basilar airspace disease is most likely atelectasis. At the right lung base, pneumonia or aspiration cannot be excluded. Aortic Atherosclerosis (ICD10-I70.0). Electronically Signed   By: Abigail Miyamoto M.D.   On: 07/16/2021 14:07     LOS: 2 days   Oren Binet, MD  [facility]    To contact the attending provider between 7A-7P or the covering provider during  after hours 7P-7A, please log into the web site [email or web address] and access using universal [place] password for that web site. If you do not have the password, please call the hospital operator.  07/18/2021, 11:42 AM

## 2021-07-18 NOTE — Progress Notes (Signed)
Palliative Medicine Inpatient Follow Up Note   HPI: Patient is a 88 y.o.  female with a history of COPD, chronic HFpEF, chronic hypoxic respiratory failure on 2 L of oxygen at home, DM-2, CKD stage IIIb-who was hospitalized from 6/24-6/26 for hypoglycemia in the setting of AKI-presented to the hospital on 6/29 with SOB/worsening lower extremity edema-found to have aspiration PNA and acute on chronic HFpEF.  Palliative care has been asked to get involved to further address goals of care in the setting of multiple chronic comorbid conditions and likelihood of poor outcomes with cardiopulmonary resuscitation.  Today's Discussion 07/18/2021  *Please note that this is a verbal dictation therefore any spelling or grammatical errors are due to the "[place] [employer]" system interpretation.  Chart reviewed inclusive of vital signs, progress notes, laboratory results, and diagnostic images.   I met with Rebecca Elliott and her daughter, Rebecca Elliott this morning at bedside.    We discussed that Rebecca Elliott had a poor night sleep in the setting of coughing and having fecal discharge.  We reviewed that she is likely having loose bowels in the setting of her antibiotics.  I shared that I would pass this along to the primary team to see if we could order some Imodium for relief.  Rebecca Elliott felt strongly if we were able to control the bowels that he would hopefully have a better night tonight.  I asked Rebecca Elliott if she had ever taken anything to help her sleep and she shares that she does take melatonin per Rebecca Elliott she takes two 5 mg tabs at night.  We talked about resuming that to see if it would help as well.  Discussed that often in the hospital patients circadian rhythms can get thrown off.  I shared the greatest worry associated with this is that patients become mentally altered.  We reviewed the importance of restoring patient's regular sleep-wake habits in an effort to avoid such complications.  We discussed that Rebecca Elliott would get out  of bed to the chair today and try to mobilize as much as possible.  From the perspective of Rebecca Elliott shortness of breath she has had multiple desaturation episodes when I looked at the amount of oxygen she is receiving it is reduced from 4 to 2 L.  Her nurse was present and I placed it back to 3 L to see if this helps.  We reviewed that it may take her a few days to get back to her prior, baseline level of oxygen.  Discussed with Rebecca Elliott the need for her to preserve her own health and getting sleep herself.  She shares that she has been both in the hospital for [facility] as well as her sister who just so happens to be Rebecca Elliott down the hall.  Rebecca Elliott shares that Rebecca Elliott and Rebecca Elliott will be here to help throughout the day so she may be able to get some rest.  Questions and concerns addressed   Palliative Support Provided  Objective Assessment: Vital Signs Vitals:   07/18/21 0828 07/18/21 1144  BP: 139/71 (!) 122/50  Pulse: 83 76  Resp: (!) 26 18  Temp: 98.3 F (36.8 C) 98.4 F (36.9 C)  SpO2: 93% 96%    Intake/Output Summary (Last 24 hours) at 07/18/2021 1245 Last data filed at 07/18/2021 0100 Gross per 24 hour  Intake --  Output 650 ml  Net -650 ml   Last Weight  Most recent update: 07/18/2021  3:55 AM    Weight  89.9 kg (198 lb 3.1  oz)            Gen: Elderly Caucasian female in no acute distress HEENT: moist mucous membranes CV: irregular rate and rhythm PULM: On 3 L nasal cannula, breathing heavily, audible wheeze ABD: soft/nontender EXT: Bilateral lower extremity edema Neuro: Alert and oriented x3  SUMMARY OF RECOMMENDATIONS   DNAR/DNI   Placed a gold DNR in patient's chart   Allow time for outcomes  In terms of insomnia added melatonin 10 mg nightly  For loose bowels primary team has added Imodium   Continue current plan of care - goals are for continued improvement   We will broach the topic of outpatient palliative support tomorrow   Ongoing  palliative care support  Billing based on MDM: High ______________________________________________________________________________________ [place] Team Team Cell Phone: [phone] Please utilize secure chat with additional questions, if there is no response within 30 minutes please call the above phone number  Palliative Medicine Team providers are available by phone from 7am to 7pm daily and can be reached through the team cell phone.  Should this patient require assistance outside of these hours, please call the patient's attending physician.

## 2021-07-19 ENCOUNTER — Inpatient Hospital Stay (HOSPITAL_COMMUNITY): Payer: Medicare Other

## 2021-07-19 DIAGNOSIS — I509 Heart failure, unspecified: Secondary | ICD-10-CM | POA: Diagnosis not present

## 2021-07-19 DIAGNOSIS — Z515 Encounter for palliative care: Secondary | ICD-10-CM | POA: Diagnosis not present

## 2021-07-19 LAB — CBC
HCT: 28.8 % — ABNORMAL LOW (ref 36.0–46.0)
Hemoglobin: 8.9 g/dL — ABNORMAL LOW (ref 12.0–15.0)
MCH: 23.3 pg — ABNORMAL LOW (ref 26.0–34.0)
MCHC: 30.9 g/dL (ref 30.0–36.0)
MCV: 75.4 fL — ABNORMAL LOW (ref 80.0–100.0)
Platelets: 312 10*3/uL (ref 150–400)
RBC: 3.82 MIL/uL — ABNORMAL LOW (ref 3.87–5.11)
RDW: 15.9 % — ABNORMAL HIGH (ref 11.5–15.5)
WBC: 9.7 10*3/uL (ref 4.0–10.5)
nRBC: 0.2 % (ref 0.0–0.2)

## 2021-07-19 LAB — BLOOD GAS, ARTERIAL
Acid-Base Excess: 1.5 mmol/L (ref 0.0–2.0)
Bicarbonate: 26.6 mmol/L (ref 20.0–28.0)
O2 Saturation: 98.3 %
Patient temperature: 36.5
pCO2 arterial: 42 mmHg (ref 32–48)
pH, Arterial: 7.41 (ref 7.35–7.45)
pO2, Arterial: 84 mmHg (ref 83–108)

## 2021-07-19 LAB — BASIC METABOLIC PANEL
Anion gap: 6 (ref 5–15)
BUN: 22 mg/dL (ref 8–23)
CO2: 23 mmol/L (ref 22–32)
Calcium: 8.2 mg/dL — ABNORMAL LOW (ref 8.9–10.3)
Chloride: 104 mmol/L (ref 98–111)
Creatinine, Ser: 1.79 mg/dL — ABNORMAL HIGH (ref 0.44–1.00)
GFR, Estimated: 26 mL/min — ABNORMAL LOW (ref >60)
Glucose, Bld: 124 mg/dL — ABNORMAL HIGH (ref 70–99)
Potassium: 3.1 mmol/L — ABNORMAL LOW (ref 3.5–5.1)
Sodium: 133 mmol/L — ABNORMAL LOW (ref 135–145)

## 2021-07-19 LAB — BRAIN NATRIURETIC PEPTIDE: B Natriuretic Peptide: 1026 pg/mL — ABNORMAL HIGH (ref 0.0–100.0)

## 2021-07-19 LAB — GLUCOSE, CAPILLARY: Glucose-Capillary: 179 mg/dL — ABNORMAL HIGH (ref 70–99)

## 2021-07-19 LAB — MAGNESIUM: Magnesium: 1.7 mg/dL (ref 1.7–2.4)

## 2021-07-19 MED ORDER — IPRATROPIUM-ALBUTEROL 0.5-2.5 (3) MG/3ML IN SOLN
3.0000 mL | Freq: Three times a day (TID) | RESPIRATORY_TRACT | Status: DC
  Administered 2021-07-19 – 2021-07-20 (×4): 3 mL via RESPIRATORY_TRACT
  Filled 2021-07-19 (×4): qty 3

## 2021-07-19 MED ORDER — BENZONATATE 100 MG PO CAPS
100.0000 mg | ORAL_CAPSULE | Freq: Two times a day (BID) | ORAL | Status: DC | PRN
  Administered 2021-07-19: 100 mg via ORAL
  Filled 2021-07-19: qty 1

## 2021-07-19 MED ORDER — AMOXICILLIN-POT CLAVULANATE 500-125 MG PO TABS
1.0000 | ORAL_TABLET | Freq: Two times a day (BID) | ORAL | Status: DC
  Administered 2021-07-19 – 2021-07-24 (×10): 500 mg via ORAL
  Filled 2021-07-19 (×10): qty 1

## 2021-07-19 MED ORDER — GERHARDT'S BUTT CREAM
1.0000 | TOPICAL_CREAM | Freq: Two times a day (BID) | CUTANEOUS | Status: DC
  Administered 2021-07-19 – 2021-07-26 (×15): 1 via TOPICAL
  Filled 2021-07-19 (×4): qty 1

## 2021-07-19 MED ORDER — POTASSIUM CHLORIDE 20 MEQ PO PACK
20.0000 meq | PACK | Freq: Once | ORAL | Status: DC
Start: 2021-07-19 — End: 2021-07-19

## 2021-07-19 MED ORDER — POTASSIUM CHLORIDE 20 MEQ PO PACK
40.0000 meq | PACK | Freq: Once | ORAL | Status: AC
Start: 2021-07-19 — End: 2021-07-19
  Administered 2021-07-19: 40 meq via ORAL
  Filled 2021-07-19: qty 2

## 2021-07-19 MED ORDER — POTASSIUM CHLORIDE CRYS ER 20 MEQ PO TBCR
40.0000 meq | EXTENDED_RELEASE_TABLET | Freq: Once | ORAL | Status: AC
  Administered 2021-07-19: 40 meq via ORAL
  Filled 2021-07-19: qty 2

## 2021-07-19 MED ORDER — AMOXICILLIN-POT CLAVULANATE 500-125 MG PO TABS
1.0000 | ORAL_TABLET | Freq: Three times a day (TID) | ORAL | Status: DC
  Administered 2021-07-19: 500 mg via ORAL
  Filled 2021-07-19 (×2): qty 1

## 2021-07-19 MED ORDER — FUROSEMIDE 10 MG/ML IJ SOLN
60.0000 mg | Freq: Once | INTRAMUSCULAR | Status: AC
  Administered 2021-07-19: 60 mg via INTRAVENOUS
  Filled 2021-07-19: qty 6

## 2021-07-19 MED ORDER — FUROSEMIDE 10 MG/ML IJ SOLN
60.0000 mg | Freq: Once | INTRAMUSCULAR | Status: AC
  Administered 2021-07-20: 60 mg via INTRAVENOUS
  Filled 2021-07-19: qty 6

## 2021-07-19 MED ORDER — HYDROXYZINE HCL 10 MG PO TABS
10.0000 mg | ORAL_TABLET | Freq: Three times a day (TID) | ORAL | Status: DC | PRN
  Administered 2021-07-19 – 2021-07-26 (×12): 10 mg via ORAL
  Filled 2021-07-19 (×12): qty 1

## 2021-07-19 NOTE — PMR Pre-admission (Shared)
PMR Admission Coordinator Pre-Admission Assessment  Patient: Rebecca Elliott is an 88 y.o., female MRN: [id number] DOB: 05-23-1926 Height: '5\' 6"'  (167.6 cm) Weight: 90.5 kg  Insurance Information HMO:     PPO:      PCP:      IPA:      80/20: yes     OTHER:  PRIMARY: Medicare A & B      Policy#: [id number]      Subscriber: patient CM Name:       Phone#:      Fax#:  Pre-Cert#:       Employer:  Benefits:  Phone #: verified eligibility via [employer] on 07/19/21     Name:  Eff. Date: Part A & B effective 01/18/93     Deduct: $1,600      Out of Pocket Max: NA      Life Max: Na CIR: 100% coverage      SNF: 100% coverage days 1-20, 80% coverage days 21-100 Outpatient: 80% coverage     Co-Pay: 20% Home Health: 100% coverage      Co-Pay:  DME: 80% coverage     Co-Pay: 20% Providers: pt's choice  SECONDARY: BCBS Other     Policy#: [id number]     Phone#: [phone]  Financial Counselor:       Phone#:   The "Data Collection Information Summary" for patients in Inpatient Rehabilitation Facilities with attached "Privacy Act [place] Records" was provided and verbally reviewed with: {CHL IP Patient Family BU:384536468}  Emergency Contact Information Contact Information     Name Relation Home Work Mobile   Livingston,[name]-HCPOA Daughter [phone]  [phone]   DESHAWNA, MCNEECE Daughter [phone]         Current Medical History  Patient Admitting Diagnosis: debilitiy d/t PNA History of Present Illness: Pt is a 88 year old female with medical hx significant for: depression, DM II, chronic HFpEF, GERD, HTN, HLD, COPD, chronic hypoxic respiratory failure on 2L O2 at home. Pt presented to hospital on 07/16/21 d/t SOB for few days prior to presentation and LE edema. Pt was recently hospitalized for hypoglycemia in setting of AKI from 6/24-6/26. Chest x-ray showed RLL PNA.Pt also found to have acute on chronic diastolic heart failure. Pt placed on 4L of O2 but was able to be weaned down to  2L of O2. Palliative consulted. Therapy evaluations completed and CIR recommended d/t pt's deficits in functional mobility and inability to complete ADLs independently.***    Patient[facility] from [facility] has been reviewed by the rehabilitation admission coordinator and physician.  Past Medical History  Past Medical History:  Diagnosis Date   Allergic rhinitis    COPD  and ASTHMA    Depression    Diabetes mellitus    as an adult   DJD (degenerative joint disease)    GERD (gastroesophageal reflux disease)    and HH w/ reflux   History of colonic polyps    Hyperlipidemia    Hypertension     Has the patient had major surgery during 100 days prior to admission? No  Family History   family history includes Breast cancer in her sister; Diabetes in her father and sister; Heart attack in her brother and son; Pancreatic cancer in her son; Stomach cancer in her sister.  Current Medications  Current Facility-Administered Medications:    acetaminophen (TYLENOL) tablet 1,000 mg, 1,000 mg, Oral, TID, Ferolito, Freada Bergeron, NP, 1,000 mg at 07/24/21 1510   amLODipine (NORVASC) tablet 10 mg, 10  mg, Oral, Daily, Thurnell Lose, MD, 10 mg at 07/24/21 0919   arformoterol (BROVANA) nebulizer solution 15 mcg, 15 mcg, Nebulization, BID, Ghimire, Henreitta Leber, MD, 15 mcg at 07/24/21 0754   atorvastatin (LIPITOR) tablet 10 mg, 10 mg, Oral, Daily, Ghimire, Henreitta Leber, MD, 10 mg at 07/24/21 0919   benzonatate (TESSALON) capsule 100 mg, 100 mg, Oral, TID, Rosezella Rumpf, NP, 100 mg at 07/24/21 1510   budesonide (PULMICORT) nebulizer solution 0.25 mg, 0.25 mg, Nebulization, BID, Ghimire, Henreitta Leber, MD, 0.25 mg at 07/24/21 0755   diclofenac Sodium (VOLTAREN) 1 % topical gel 2 g, 2 g, Topical, QID PRN, Ghimire, Henreitta Leber, MD   furosemide (LASIX) 200 mg in dextrose 5 % 100 mL (2 mg/mL) infusion, 10 mg/hr, Intravenous, Continuous, Thurnell Lose, MD, Last Rate: 5 mL/hr at 07/24/21 1342,  10 mg/hr at 07/24/21 1342   Gerhardt's butt cream 1 Application, 1 Application, Topical, BID, Shalhoub, Sherryll Burger, MD, 1 Application at 41/28/78 0920   guaiFENesin (MUCINEX) 12 hr tablet 600 mg, 600 mg, Oral, BID, Ghimire, Shanker M, MD, 600 mg at 07/24/21 0919   heparin injection 5,000 Units, 5,000 Units, Subcutaneous, Q8H, Ghimire, Henreitta Leber, MD, 5,000 Units at 07/24/21 1510   hydrALAZINE (APRESOLINE) injection 10 mg, 10 mg, Intravenous, Q8H PRN, Ghimire, Shanker M, MD   hydrALAZINE (APRESOLINE) tablet 100 mg, 100 mg, Oral, TID, Thurnell Lose, MD, 100 mg at 07/24/21 1509   hydrOXYzine (ATARAX) tablet 10 mg, 10 mg, Oral, TID PRN, Rosezella Rumpf, NP, 10 mg at 07/24/21 0918   ipratropium-albuterol (DUONEB) 0.5-2.5 (3) MG/3ML nebulizer solution 3 mL, 3 mL, Nebulization, Q6H PRN, Jonetta Osgood, MD, 3 mL at 07/19/21 0512   [START ON 07/25/2021] isosorbide mononitrate (IMDUR) 24 hr tablet 60 mg, 60 mg, Oral, Daily, Lala Lund K, MD   loperamide (IMODIUM) capsule 2 mg, 2 mg, Oral, PRN, Jonetta Osgood, MD, 2 mg at 07/24/21 0523   loratadine (CLARITIN) tablet 10 mg, 10 mg, Oral, Daily, Ghimire, Shanker M, MD, 10 mg at 07/24/21 0919   metoprolol succinate (TOPROL-XL) 24 hr tablet 100 mg, 100 mg, Oral, Daily, Ghimire, Shanker M, MD, 100 mg at 07/24/21 0919   multivitamin with minerals tablet 1 tablet, 1 tablet, Oral, Daily, Ghimire, Henreitta Leber, MD, 1 tablet at 07/24/21 0919   Muscle Rub CREA, , Topical, BID PRN, Ghimire, Henreitta Leber, MD   ondansetron (ZOFRAN) tablet 4 mg, 4 mg, Oral, Q6H PRN **OR** ondansetron (ZOFRAN) injection 4 mg, 4 mg, Intravenous, Q6H PRN, Ghimire, Shanker M, MD   pantoprazole (PROTONIX) EC tablet 40 mg, 40 mg, Oral, Daily, Ghimire, Shanker M, MD, 40 mg at 07/24/21 0919   polyethylene glycol (MIRALAX / GLYCOLAX) packet 17 g, 17 g, Oral, Daily PRN, Ghimire, Shanker M, MD   potassium chloride SA (KLOR-CON M) CR tablet 40 mEq, 40 mEq, Oral, BID, Thurnell Lose, MD, 40  mEq at 07/24/21 1127   revefenacin (YUPELRI) nebulizer solution 175 mcg, 175 mcg, Nebulization, Daily, Ghimire, Henreitta Leber, MD, 175 mcg at 07/24/21 0754   spironolactone (ALDACTONE) tablet 50 mg, 50 mg, Oral, Daily, Thurnell Lose, MD, 50 mg at 07/24/21 1126   traZODone (DESYREL) tablet 50 mg, 50 mg, Oral, QHS, Ferolito, Freada Bergeron, NP  Patients Current Diet:  Diet Order             Diet regular Room service appropriate? Yes; Fluid consistency: Thin  Diet effective now  Precautions / Restrictions Precautions Precautions: Fall Precaution Comments: lethargic today Restrictions Weight Bearing Restrictions: No   Has the patient had 2 or more falls or a fall with injury in the past year? Yes  Prior Activity Level Limited Community (1-2x/wk): gets out of house ~1 day/week  Prior Functional Level Self Care: Did the patient need help bathing, dressing, using the toilet or eating? Independent  Indoor Mobility: Did the patient need assistance with walking from room to room (with or without device)? Independent  Stairs: Did the patient need assistance with internal or external stairs (with or without device)? Needed some help  Functional Cognition: Did the patient need help planning regular tasks such as shopping or remembering to take medications? Independent  Patient Information Are you of Hispanic, Latino/a,or Spanish origin?: A. No, not of Hispanic, Latino/a, or Spanish origin What is your race?: A. White Do you need or want an interpreter to communicate with a doctor or health care staff?: 0. No  Patient's Response To:  Health Literacy and Transportation Is the patient able to respond to health literacy and transportation needs?: Yes Health Literacy - How often do you need to have someone help you when you read instructions, pamphlets, or other written material from your doctor or pharmacy?: Never In the past 12 months, has lack of transportation kept you  from medical appointments or from getting medications?: No In the past 12 months, has lack of transportation kept you from meetings, work, or from getting things needed for daily living?: No  Home Assistive Devices / [place] Devices/Equipment: Environmental consultant (specify type) Home Equipment: Conservation officer, nature (2 wheels), [employer] - single point, [employer], BSC/3in1  Prior Device Use: Indicate devices/aids used by the patient prior to current illness, exacerbation or injury? Walker (rollator) and cane  Current Functional Level Cognition  Overall Cognitive Status: Within Functional Limits for tasks assessed Orientation Level: Oriented X4 General Comments: pleasant, motivated    Extremity Assessment (includes Sensation/Coordination)  Upper Extremity Assessment: Defer to OT evaluation  Lower Extremity Assessment: Generalized weakness    ADLs  Overall ADL's : Needs assistance/impaired Eating/Feeding: Set up Grooming: Wash/dry hands, Wash/dry face, Min guard, Standing Grooming Details (indicate cue type and reason): completed at sink Upper Body Bathing: Minimal assistance Lower Body Bathing: Maximal assistance, Sitting/lateral leans, Sit to/from stand Lower Body Bathing Details (indicate cue type and reason): difficulty maintaining balance while completing pericare Upper Body Dressing : Minimal assistance Lower Body Dressing: Moderate assistance Lower Body Dressing Details (indicate cue type and reason): patient able to place LE into figure 4 postion to don/doff sock Toilet Transfer: Minimal assistance, Moderate assistance, Ambulation Toilet Transfer Details (indicate cue type and reason): Initial stand required mod A to power up, [facility] from [facility] she only required minA Toileting- Clothing Manipulation and Hygiene: Moderate assistance, Sitting/lateral lean, Sit to/from stand Toileting - Clothing Manipulation Details (indicate cue type and reason): Mod A for pericare in  standing Functional mobility during ADLs: Min guard, Rolling walker (2 wheels) General ADL Comments: Pt with improved strength and balance this session, able to complete multiple ADL tasks    Mobility  Overal bed mobility: Needs Assistance Bed Mobility: Supine to Sit Supine to sit: Min assist General bed mobility comments: up in recliner    Transfers  Overall transfer level: Needs assistance Equipment used: Rolling walker (2 wheels) Transfers: Sit to/from Stand Sit to Stand: Min assist General transfer comment: from recliner x6 reps total (with some rest breaks)    Ambulation / Gait /  Stairs / Emergency planning/management officer  Ambulation/Gait Ambulation/Gait assistance: Herbalist (Feet): 75 Feet (including 3 standing breaks) Assistive device: Rolling walker (2 wheels) Gait Pattern/deviations: Decreased stride length, Trunk flexed General Gait Details: mod cues for upright posture, pursed-lip breathing; SpO2 mostly WFL on 2L, desat to 91% but improves with pursed-lip breathing and standing breaks Gait velocity: grossly <0.3 m/s Gait velocity interpretation: <1.31 ft/sec, indicative of household ambulator    Posture / Balance Balance Overall balance assessment: Needs assistance, History of Falls Sitting-balance support: Feet supported Sitting balance-Leahy Scale: Good Postural control: Right lateral lean Standing balance support: Bilateral upper extremity supported, Single extremity supported Standing balance-Leahy Scale: Fair Standing balance comment: static standing with U UE support; BUE support for dynamic tasks    Special needs/care consideration Oxygen 2L of O2 via nasal cannula, Bladder and Bowel incontinence, External urinary catheter and Skin Abrasion: sacrum/left; Erythema/Redness: perineum/bilateral   Previous Home Environment (from acute therapy documentation) Living Arrangements: Children  Lives With: Daughter Available Help at Discharge: Family, Available 24  hours/day Type of Home: House (townhouse) Home Layout: Multi-level, Able to live on main level with bedroom/bathroom Home Access: Stairs to enter Entrance Stairs-Rails: Right, Left Entrance Stairs-Number of Steps: 7 Bathroom Shower/Tub: Multimedia programmer: Handicapped height (has bedside commode) Bathroom Accessibility: No How Accessible: Accessible via walker [place]: No  Discharge Living Setting Plans for Discharge Living Setting: Patient's home Type of Home at Discharge: House (townhouse) Discharge Home Layout: Multi-level, Able to live on main level with bedroom/bathroom Alternate Level Stairs-Rails: Right Alternate Level Stairs-Number of Steps: 25 Discharge Home Access: Stairs to enter Entrance Stairs-Rails: Right, Left Entrance Stairs-Number of Steps: 7 Discharge Bathroom Shower/Tub: Walk-in shower Discharge Bathroom Toilet: Handicapped height (has bedside commode) Discharge Bathroom Accessibility: No Does the patient have any problems obtaining your medications?: No  Social/Family/Support Systems Anticipated Caregiver: Abimbola Aki, daughter and other family Anticipated Caregiver's Contact Information: [phone] Caregiver Availability: 24/7 Discharge Plan Discussed with Primary Caregiver: Yes Is Caregiver In Agreement with Plan?: Yes Does Caregiver/Family have Issues with Lodging/Transportation while Pt is in Rehab?: No  Goals Patient/Family Goal for Rehab: PT/OT supervision to min assist level, depending on fatigue Expected length of stay: 6-9 days Pt/Family Agrees to Admission and willing to participate: Yes Program Orientation Provided & Reviewed with Pt/Caregiver Including Roles  & Responsibilities: Yes  Barriers to Discharge: Medical stability  Decrease burden of Care through IP rehab admission: NA  Possible need for SNF placement upon discharge: Not anticipated  Patient Condition: I have reviewed medical records from [facility], spoken with CM, and patient and daughter. I met with patient at the bedside for inpatient rehabilitation assessment.  Patient will benefit from ongoing PT, OT, and SLP, can actively participate in 3 hours of therapy a day 5 days of the week, and can make measurable gains during the admission.  Patient will also benefit from the coordinated team approach during an Inpatient Acute Rehabilitation admission.  The patient will receive intensive therapy as well as Rehabilitation physician, nursing, social worker, and care management interventions.  Due to bladder management, bowel management, safety, skin/wound care, disease management, medication administration, pain management, and patient education the patient requires 24 hour a day rehabilitation nursing.  The patient is currently *** with mobility and basic ADLs.  Discharge setting and therapy post discharge at home with home health is anticipated.  Patient has agreed to participate in the Acute Inpatient Rehabilitation Program and will admit {Time; today/tomorrow:10263}.  Preadmission Screen  Completed By:  Bethel Born, 07/24/2021 3:56 PM ______________________________________________________________________   Discussed status with Dr. Marland Kitchen on *** at *** and received approval for admission today.  Admission Coordinator:  Bethel Born, CCC-SLP, time ***/Date ***   Assessment/Plan: Diagnosis: Does the need for close, 24 hr/day Medical supervision in concert with the patient's rehab needs make it unreasonable for this patient to be served in a less intensive setting? {yes_no_potentially:3041433} Co-Morbidities requiring supervision/potential complications: *** Due to {due YH:0623762}, does the patient require 24 hr/day rehab nursing? {yes_no_potentially:3041433} Does the patient require coordinated care of a physician, rehab nurse, PT, OT, and SLP to address physical and functional deficits in the context of the above medical  diagnosis(es)? {yes_no_potentially:3041433} Addressing deficits in the following areas: {deficits:3041436} Can the patient actively participate in an intensive therapy program of at least 3 hrs of therapy 5 days a week? {yes_no_potentially:3041433} The potential for patient to make measurable gains while on inpatient rehab is {potential:3041437} Anticipated functional outcomes upon discharge from inpatient rehab: {functional outcomes:304600100} PT, {functional outcomes:304600100} OT, {functional outcomes:304600100} SLP Estimated rehab length of stay to reach the above functional goals is: *** Anticipated discharge destination: {anticipated dc setting:21604} 10. Overall Rehab/Functional Prognosis: {potential:3041437}   MD Signature: ***

## 2021-07-19 NOTE — Progress Notes (Addendum)
HOSPITAL MEDICINE OVERNIGHT EVENT NOTE    Notified by nursing that patient is exhibiting increasing work of breathing throughout the evening shift.  Patient is currently breathing nearly 30 times a minute without associated hypoxia as the patient is still requiring 3 L of oxygen via nasal cannula.  Nurse reports her lung exam reveals expiratory wheezing and diminished breath sounds at the bases.  Chart reviewed, patient currently is suffering from acute on chronic hypoxic respiratory failure thought to be in active factorial secondary to aspiration pneumonia with possible superimposed pulmonary edema.  Nursing is called respiratory to evaluate the patient at the bedside with patient to receive as needed nebulized bronchodilator therapy as necessary.  Obtaining stat ABG and chest x-ray.  We will follow-up.  Vernelle Emerald  MD [facility]   ADDENDUM (7/2 11:55pm)  ABG resulted and has normal pH with appropriate PCO2.  PaO2 is as expected.  I do not believe that NIPPV is indicated at this time.  Chest x-ray has been reviewed and seems to exhibit worsening pulmonary edema.  We will order an additional 60 mg of intravenous Lasix now.  Will monitor for clinical response.   Sherryll Burger [place]oe [place]

## 2021-07-19 NOTE — Progress Notes (Signed)
Pt has had 3 loose stools since 1900.  Immodium given x 2 since then and stools seem to have slowed down somewhat.  Peri area red and extremely tender.  Gentle peri care provided with each stool.  Discussed with Dr. Cyd Silence and will try some Gerhardt's.  Pt has also had some non productive coughing "spells" where she cannot stop coughing.  Also discussed with Dr. Cyd Silence and will try PRN Tessalon.  Respirations have been 24-36/min at rest.  Lungs diminished and some wheezing noted.  Sats maintained low 90's on 3 liters n/c.  PRN nebs given.  PRN Vistaril also given.    Currently, pt is calmer and trying to rest and respirations are about 30.  Discussed respiratory status with Dr. Cyd Silence.  Will obtain ABG, chemistry and portable chest xray to determine next steps.  Pt's daughter at bedside updated. Ayesha Mohair BSN RN CMSRN 07/19/2021, 11:07 PM

## 2021-07-19 NOTE — Progress Notes (Signed)
PHARMACY NOTE:  ANTIMICROBIAL RENAL DOSAGE ADJUSTMENT  Current antimicrobial regimen includes a mismatch between antimicrobial dosage and estimated renal function.  As per policy approved by the Pharmacy & Therapeutics and Medical Executive Committees, the antimicrobial dosage will be adjusted accordingly.  Current antimicrobial dosage:  Augmentin 500-125 mg TID  Indication: aspiration pneumonia  Renal Function:  Estimated Creatinine Clearance: 22.4 mL/min (A) (by C-G formula based on SCr of 1.79 mg/dL (H)).     Antimicrobial dosage has been changed to:  Augmentin 500-125 mg every 12 hours   Thank you for allowing [place] to participate in this patients care. Jens Som, PharmD 07/19/2021 2:28 PM  **Pharmacist phone directory can be found on [email or web address] listed under [place]**

## 2021-07-19 NOTE — Progress Notes (Signed)
Inpatient Rehab Admissions:  Inpatient Rehab Consult received.  I met with patient and [name] at the bedside for rehabilitation assessment and to discuss goals and expectations of an inpatient rehab admission.  Both acknowledged understanding of CIR goals and expectations. Pt interested in pursuing CIR and [name] supportive. [name] confirmed that family will be able to provide 24/7 support for pt after discharge. Will continue to follow.  Signed: Gayland Curry, [place], [place] Admissions Coordinator [phone]

## 2021-07-19 NOTE — Progress Notes (Signed)
Palliative Medicine Inpatient Follow Up Note   HPI: Patient is a 88 y.o.  female with a history of COPD, chronic HFpEF, chronic hypoxic respiratory failure on 2 L of oxygen at home, DM-2, CKD stage IIIb-who was hospitalized from 6/24-6/26 for hypoglycemia in the setting of AKI-presented to the hospital on 6/29 with SOB/worsening lower extremity edema-found to have aspiration PNA and acute on chronic HFpEF.  Palliative care has been asked to get involved to further address goals of care in the setting of multiple chronic comorbid conditions and likelihood of poor outcomes with cardiopulmonary resuscitation.  Today's Discussion 07/19/2021  *Please note that this is a verbal dictation therefore any spelling or grammatical errors are due to the "[place]" system interpretation.  Chart reviewed inclusive of vital signs, progress notes, laboratory results, and diagnostic images.   I met with Rebecca Elliott and her daughter, Rebecca Elliott at bedside.    Rebecca Elliott shares that she had another rough night and was up for both 1 AM and 5 AM in the setting of having loose bowels.  We reviewed the amount of Imodium she had received which was only run dose.  I shared that she can receive more than this moving forward which would hopefully alleviate the symptoms she is experiencing.  Melatonin was tried last night to some degree was found to be helpful other than the aforementioned issue.  In terms of Rebecca Elliott's breathing she is sharing some feelings of anxiety when feeling breathless.  We reviewed variety of different medications that can be used for this though I shared some are safer than others.  Discussed starting medication as needed to further aid in anxiety management.  Regarding Rebecca Elliott's overall breathing it does seem to be stable for the most part she is maintained on 2 to 3 L of nasal cannula.   Rebecca Elliott expresses a goal of getting up to chair today.  Questions and concerns addressed   Palliative Support  Provided  Objective Assessment: Vital Signs Vitals:   07/19/21 0824 07/19/21 1425  BP: (!) 157/55   Pulse:    Resp:  (!) 22  Temp:    SpO2:      Intake/Output Summary (Last 24 hours) at 07/19/2021 1658 Last data filed at 07/19/2021 0416 Gross per 24 hour  Intake 250 ml  Output --  Net 250 ml    Last Weight  Most recent update: 07/19/2021  5:17 AM    Weight  91.6 kg (201 lb 15.1 oz)            Gen: Elderly Caucasian female in no acute distress  HEENT: Dry mucous membranes CV: irregular rate and rhythm PULM: On 2L nasal cannula  ABD: soft/nontender EXT: Bilateral lower extremity edema Neuro: Alert and oriented x3  SUMMARY OF RECOMMENDATIONS   DNAR/DNI   Continue to allow time for outcomes  Insomnia continue melatonin 10 mg nightly  Loose bowels Imodium PRN --> reviewed that patient cannot more than 1 dose a day   Continue current plan of care --> goals are for continued improvement    Ongoing palliative care support ______________________________________________________________________________________ [place] Team Team Cell Phone: [phone] Please utilize secure chat with additional questions, if there is no response within 30 minutes please call the above phone number  Palliative Medicine Team providers are available by phone from 7am to 7pm daily and can be reached through the team cell phone.  Should this patient require assistance outside of these hours, please call the patient's  attending physician.

## 2021-07-19 NOTE — Progress Notes (Signed)
PROGRESS NOTE        PATIENT DETAILS Name: Rebecca Elliott Age: 88 y.o. Sex: female Date of Birth: 1926/07/22 Admit Date: 07/16/2021 Admitting Physician Evalee Mutton Kristeen Mans, MD PCP:Paz, Alda Berthold, MD  Brief Summary: Patient is a 88 y.o.  female with a history of COPD, chronic HFpEF, chronic hypoxic respiratory failure on 2 L of oxygen at home, DM-2, CKD stage IIIb-who was hospitalized from 6/24-6/26 for hypoglycemia in the setting of AKI-presented to the hospital on 6/29 with SOB/worsening lower extremity edema-found to have aspiration PNA and acute on chronic HFpEF.   Significant events: 6/29>> admit to [facility]-worsening hypoxemia due to aspiration PNA and acute on chronic HFpEF.  Significant studies: 6/29>> CXR: RLL PNA-pulmonary venous congestion 6/30>> Echo: EF 59-93%, grade 2 diastolic dysfunction, positive wall motion abnormalities.  Significant microbiology data: 6/29>> COVID PCR: Negative 6/29>> blood culture: Negative  Procedures:   Consults: None  Subjective:  Patient in bed, appears comfortable, denies any headache, no fever, no chest pain or pressure, ++ orthopnea and + shortness of breath , no abdominal pain. No new focal weakness.   Objective: Vitals: Blood pressure (!) 157/55, pulse 91, temperature 97.8 F (36.6 C), temperature source Oral, resp. rate 20, height '5\' 6"'$  (1.676 m), weight 91.6 kg, SpO2 91 %.   Exam:  Awake Alert, No new F.N deficits, Normal affect [place].AT,PERRAL Supple Neck, No JVD,   Symmetrical Chest wall movement, Good air movement bilaterally, ++ rales RRR,No Gallops, Rubs or new Murmurs,  +ve B.Sounds, Abd Soft, No tenderness,   No Cyanosis,    Assessment/Plan: Acute on chronic hypoxic respiratory failure due 2 acute on chronic diastolic CHF EF preserved at 60% with possible aspiration pneumonia: Overall still short of breath and evidence of fluid overload, increase Lasix to IV, continue empiric antibiotics but  titrate Unasyn to Augmentin, encouraged to sit up in chair use I-S and flutter valve for pulmonary toiletry, supplemental oxygen advance activity and monitor.  Minimally elevated troponins: Trend is flat-not consistent with ACS.  Suspect this is due to demand ischemia.  Echo with stable EF but has some wall motion abnormalities-given advanced age-CKD stage IIIb-reasonable to manage this medically.   Hypokalemia: Replete and recheck.  CKD stage IIIb: Slight bump in creatinine but still not far from baseline-switching to oral diuretics today.  HTN: BP stable-metoprolol/hydralazine-losartan on hold to allow room for diuresis.    HLD: Continue statin  GERD: Continue PPI  COPD with chronic hypoxic respiratory failure on 2 L of oxygen (as needed):-Normally uses oxygen as needed-but since her discharge from her most recent hospitalization-she has been on 2 L of oxygen 24/7.  She has no evidence of exacerbation at this point-continue as needed bronchodilators.  OA bilateral knees: Supportive care   Chronic debility/deconditioning: Normally walks around the house with the help of a walker/cane-obtaining PT/OT eval.  Palliative care: DNR for now-family aware of poor overall long-term prognosis given development of aspiration pneumonia.  Plan is to diuresis much as possible-continue IV antibiotics and see how much she will improve.  Palliative care following.  DM-2 (A1c 6.0 on 3/14): CBG with borderline hypoglycemia this morning-stop SSI-watch closely.  Not a candidate for aggressive glycemic control.    Recent Labs    07/18/21 1146 07/18/21 1638 07/18/21 2116  GLUCAP 163* 238* 110*    Obesity: Estimated body mass index is 32.59 kg/m  as calculated from the following:   Height as of this encounter: '5\' 6"'$  (1.676 m).   Weight as of this encounter: 91.6 kg.   Code status:   Code Status: DNR   DVT Prophylaxis: heparin injection 5,000 Units Start: 07/16/21 1630   Family Communication:  Daughter at bedside 07/19/21   Disposition Plan: Status is: Inpatient Remains inpatient appropriate because: Hypoxia due to aspiration PNA/CHF exacerbation-on IV antibiotics   Planned Discharge Destination:Home health   Diet: Diet Order             DIET DYS 3 Room service appropriate? Yes with Assist; Fluid consistency: Thin; Fluid restriction: 1200 mL Fluid  Diet effective now                    MEDICATIONS: Scheduled Meds:  amoxicillin-clavulanate  1 tablet Oral TID AC   arformoterol  15 mcg Nebulization BID   atorvastatin  10 mg Oral Daily   budesonide (PULMICORT) nebulizer solution  0.25 mg Nebulization BID   furosemide  60 mg Intravenous Once   guaiFENesin  600 mg Oral BID   heparin  5,000 Units Subcutaneous Q8H   hydrALAZINE  25 mg Oral TID   ipratropium-albuterol  3 mL Nebulization TID   loratadine  10 mg Oral Daily   melatonin  10 mg Oral QHS   metoprolol succinate  100 mg Oral Daily   multivitamin with minerals  1 tablet Oral Daily   pantoprazole  40 mg Oral Daily   potassium chloride  40 mEq Oral Once   revefenacin  175 mcg Nebulization Daily   Continuous Infusions:   PRN Meds:.acetaminophen **OR** acetaminophen, diclofenac Sodium, hydrALAZINE, ipratropium-albuterol, loperamide, Muscle Rub, ondansetron **OR** ondansetron (ZOFRAN) IV, polyethylene glycol   I have personally reviewed following labs and imaging studies  LABORATORY DATA:  Recent Labs  Lab 07/16/21 1346 07/16/21 1750 07/17/21 0833 07/17/21 1443 07/19/21 0221  WBC 21.6* 23.9* 25.0* 19.8* 9.7  HGB 9.9* 9.4* 10.1* 9.2* 8.9*  HCT 31.5* 31.3* 31.8* 29.2* 28.8*  PLT 320 303 314 284 312  MCV 75.0* 76.9* 74.6* 75.1* 75.4*  MCH 23.6* 23.1* 23.7* 23.7* 23.3*  MCHC 31.4 30.0 31.8 31.5 30.9  RDW 15.8* 15.8* 15.9* 15.9* 15.9*  LYMPHSABS 1.9  --   --  2.2  --   MONOABS 1.8*  --   --  1.2*  --   EOSABS 0.0  --   --  0.0  --   BASOSABS 0.1  --   --  0.0  --     Recent Labs  Lab  07/13/21 0435 07/16/21 1346 07/16/21 1750 07/17/21 0833 07/18/21 0158 07/19/21 0221  NA 135 133*  --  134* 133* 133*  K 3.9 4.5  --  3.7 3.1* 3.1*  CL 109 102  --  102 100 104  CO2 22 23  --  '24 24 23  '$ GLUCOSE 101* 114*  --  70 100* 124*  BUN 22 19  --  20 24* 22  CREATININE 1.47* 1.66* 1.61* 1.75* 1.90* 1.79*  CALCIUM 7.8* 8.5*  --  8.3* 8.0* 8.2*  AST  --  85*  --   --   --   --   ALT  --  14  --   --   --   --   ALKPHOS  --  50  --   --   --   --   BILITOT  --  0.4  --   --   --   --  ALBUMIN  --  2.4*  --   --   --   --   MG 2.0  --   --   --   --  1.7  BNP  --  1,829.9*  --   --   --   --           RADIOLOGY STUDIES/RESULTS: DG Chest Port 1V same Day  Result Date: 07/18/2021 CLINICAL DATA:  Shortness of breath, history diabetes mellitus, hypertension, COPD, asthma EXAM: PORTABLE CHEST 1 VIEW COMPARISON:  Portable exam 0823 hours compared to 07/16/2021 FINDINGS: Minimal enlargement of cardiac silhouette. Mediastinal contours and pulmonary vascularity normal. Atherosclerotic calcification aorta. Chronic accentuation of interstitial markings, increased in the mid to lower lungs, question edema versus infection. Probable small bibasilar pleural effusions. No pneumothorax. IMPRESSION: Increased interstitial infiltrates question edema versus infection, greater at bases. Probable small bibasilar pleural effusions. Electronically Signed   By: Lavonia Dana M.D.   On: 07/18/2021 09:27   ECHOCARDIOGRAM COMPLETE  Result Date: 07/17/2021    ECHOCARDIOGRAM REPORT   Patient Name:   ALEICIA KENAGY Date of Exam: 07/17/2021 Medical Rec #:  [id number]      Height:       66.0 in Accession #:    [id number]     Weight:       196.9 lb Date of Birth:  [birth year]      BSA:          1.986 m Patient Age:    38 years       BP:           132/53 mmHg Patient Gender: F              HR:           70 bpm. Exam Location:  Inpatient Procedure: 2D Echo, Cardiac Doppler and Color Doppler Indications:    CHF-Acute  Diastolic L79.89  History:        Patient has prior history of Echocardiogram examinations, most                 recent 11/06/2020. COPD; Risk Factors:Hypertension,                 Dyslipidemia, Diabetes and GERD.  Sonographer:    Bernadene Person RDCS Referring Phys: [place]  1. Left ventricular ejection fraction, by estimation, is 55 to 60%. The left ventricle has normal function. The left ventricle demonstrates regional wall motion abnormalities (see scoring diagram/findings for description). There is mild asymmetric left ventricular hypertrophy of the basal and septal segments. Left ventricular diastolic parameters are consistent with Grade II diastolic dysfunction (pseudonormalization). Elevated left ventricular end-diastolic pressure.  2. Right ventricular systolic function is normal. The right ventricular size is normal.  3. Left atrial size was mildly dilated.  4. The mitral valve is abnormal. Trivial mitral valve regurgitation. No evidence of mitral stenosis.  5. The aortic valve is tricuspid. There is moderate calcification of the aortic valve. There is moderate thickening of the aortic valve. Aortic valve regurgitation is mild. Mild to moderate aortic valve stenosis.  6. The inferior vena cava is normal in size with greater than 50% respiratory variability, suggesting right atrial pressure of 3 mmHg. FINDINGS  Left Ventricle: Small area of inferior basal akinesis. Left ventricular ejection fraction, by estimation, is 55 to 60%. The left ventricle has normal function. The left ventricle demonstrates regional wall motion abnormalities. The left ventricular internal cavity size was normal in size.  There is mild asymmetric left ventricular hypertrophy of the basal and septal segments. Left ventricular diastolic parameters are consistent with Grade II diastolic dysfunction (pseudonormalization). Elevated left  ventricular end-diastolic pressure. Right Ventricle: The right ventricular  size is normal. No increase in right ventricular wall thickness. Right ventricular systolic function is normal. Left Atrium: Left atrial size was mildly dilated. Right Atrium: Right atrial size was normal in size. Pericardium: There is no evidence of pericardial effusion. Mitral Valve: The mitral valve is abnormal. There is mild thickening of the mitral valve leaflet(s). There is mild calcification of the mitral valve leaflet(s). Mild mitral annular calcification. Trivial mitral valve regurgitation. No evidence of mitral valve stenosis. Tricuspid Valve: The tricuspid valve is normal in structure. Tricuspid valve regurgitation is mild . No evidence of tricuspid stenosis. Aortic Valve: The aortic valve is tricuspid. There is moderate calcification of the aortic valve. There is moderate thickening of the aortic valve. Aortic valve regurgitation is mild. Aortic regurgitation PHT measures 381 msec. Mild to moderate aortic stenosis is present. Aortic valve mean gradient measures 17.7 mmHg. Aortic valve peak gradient measures 27.6 mmHg. Aortic valve area, by VTI measures 1.49 cm. Pulmonic Valve: The pulmonic valve was normal in structure. Pulmonic valve regurgitation is not visualized. No evidence of pulmonic stenosis. Aorta: The aortic root is normal in size and structure. Venous: The inferior vena cava is normal in size with greater than 50% respiratory variability, suggesting right atrial pressure of 3 mmHg. IAS/Shunts: No atrial level shunt detected by color flow Doppler.  LEFT VENTRICLE PLAX 2D LVIDd:         4.40 cm      Diastology LVIDs:         3.40 cm      LV e' medial:    4.95 cm/s LV PW:         1.30 cm      LV E/e' medial:  27.5 LV IVS:        1.20 cm      LV e' lateral:   10.10 cm/s LVOT diam:     2.20 cm      LV E/e' lateral: 13.5 LV SV:         85 LV SV Index:   43 LVOT Area:     3.80 cm  LV Volumes (MOD) LV vol d, MOD A2C: 106.0 ml LV vol d, MOD A4C: 121.0 ml LV vol s, MOD A2C: 48.4 ml LV vol s, MOD A4C:  62.5 ml LV SV MOD A2C:     57.6 ml LV SV MOD A4C:     121.0 ml LV SV MOD BP:      59.0 ml RIGHT VENTRICLE RV S prime:     13.10 cm/s TAPSE (M-mode): 2.2 cm LEFT ATRIUM             Index        RIGHT ATRIUM           Index LA diam:        4.10 cm 2.06 cm/m   RA Area:     16.10 cm LA Vol (A2C):   96.7 ml 48.68 ml/m  RA Volume:   38.60 ml  19.43 ml/m LA Vol (A4C):   88.4 ml 44.50 ml/m LA Biplane Vol: 96.9 ml 48.78 ml/m  AORTIC VALVE AV Area (Vmax):    1.61 cm AV Area (Vmean):   1.44 cm AV Area (VTI):     1.49 cm AV Vmax:  262.67 cm/s AV Vmean:          201.000 cm/s AV VTI:            0.570 m AV Peak Grad:      27.6 mmHg AV Mean Grad:      17.7 mmHg LVOT Vmax:         111.00 cm/s LVOT Vmean:        76.000 cm/s LVOT VTI:          0.224 m LVOT/AV VTI ratio: 0.39 AI PHT:            381 msec  AORTA Ao Root diam: 3.50 cm Ao Asc diam:  3.70 cm MITRAL VALVE MV Area (PHT): 4.06 cm     SHUNTS MV Decel Time: 187 msec     Systemic VTI:  0.22 m MV E velocity: 136.00 cm/s  Systemic Diam: 2.20 cm MV A velocity: 99.40 cm/s MV E/A ratio:  1.37 Jenkins Rouge MD Electronically signed by Jenkins Rouge MD Signature Date/Time: 07/17/2021/2:48:53 PM    Final      LOS: 3 days   Signature  Lala Lund M.D on 07/19/2021 at 10:14 AM   -  To page go to [email or web address]

## 2021-07-20 DIAGNOSIS — Z515 Encounter for palliative care: Secondary | ICD-10-CM | POA: Diagnosis not present

## 2021-07-20 DIAGNOSIS — I509 Heart failure, unspecified: Secondary | ICD-10-CM | POA: Diagnosis not present

## 2021-07-20 LAB — CBC WITH DIFFERENTIAL/PLATELET
Abs Immature Granulocytes: 0.09 10*3/uL — ABNORMAL HIGH (ref 0.00–0.07)
Basophils Absolute: 0 10*3/uL (ref 0.0–0.1)
Basophils Relative: 0 %
Eosinophils Absolute: 0 10*3/uL (ref 0.0–0.5)
Eosinophils Relative: 0 %
HCT: 26.9 % — ABNORMAL LOW (ref 36.0–46.0)
Hemoglobin: 8.4 g/dL — ABNORMAL LOW (ref 12.0–15.0)
Immature Granulocytes: 1 %
Lymphocytes Relative: 18 %
Lymphs Abs: 1.8 10*3/uL (ref 0.7–4.0)
MCH: 23.3 pg — ABNORMAL LOW (ref 26.0–34.0)
MCHC: 31.2 g/dL (ref 30.0–36.0)
MCV: 74.7 fL — ABNORMAL LOW (ref 80.0–100.0)
Monocytes Absolute: 0.8 10*3/uL (ref 0.1–1.0)
Monocytes Relative: 8 %
Neutro Abs: 7 10*3/uL (ref 1.7–7.7)
Neutrophils Relative %: 73 %
Platelets: 324 10*3/uL (ref 150–400)
RBC: 3.6 MIL/uL — ABNORMAL LOW (ref 3.87–5.11)
RDW: 15.6 % — ABNORMAL HIGH (ref 11.5–15.5)
WBC: 9.8 10*3/uL (ref 4.0–10.5)
nRBC: 0 % (ref 0.0–0.2)

## 2021-07-20 LAB — BASIC METABOLIC PANEL
Anion gap: 7 (ref 5–15)
BUN: 22 mg/dL (ref 8–23)
CO2: 24 mmol/L (ref 22–32)
Calcium: 8.4 mg/dL — ABNORMAL LOW (ref 8.9–10.3)
Chloride: 103 mmol/L (ref 98–111)
Creatinine, Ser: 1.69 mg/dL — ABNORMAL HIGH (ref 0.44–1.00)
GFR, Estimated: 28 mL/min — ABNORMAL LOW (ref >60)
Glucose, Bld: 121 mg/dL — ABNORMAL HIGH (ref 70–99)
Potassium: 3.7 mmol/L (ref 3.5–5.1)
Sodium: 134 mmol/L — ABNORMAL LOW (ref 135–145)

## 2021-07-20 LAB — GLUCOSE, CAPILLARY
Glucose-Capillary: 119 mg/dL — ABNORMAL HIGH (ref 70–99)
Glucose-Capillary: 186 mg/dL — ABNORMAL HIGH (ref 70–99)
Glucose-Capillary: 180 mg/dL — ABNORMAL HIGH (ref 70–99)
Glucose-Capillary: 143 mg/dL — ABNORMAL HIGH (ref 70–99)

## 2021-07-20 LAB — BRAIN NATRIURETIC PEPTIDE: B Natriuretic Peptide: 1572.9 pg/mL — ABNORMAL HIGH (ref 0.0–100.0)

## 2021-07-20 LAB — PROCALCITONIN: Procalcitonin: >150 ng/mL

## 2021-07-20 LAB — LEGIONELLA PNEUMOPHILA SEROGP 1 UR AG: L. pneumophila Serogp 1 Ur Ag: NEGATIVE

## 2021-07-20 LAB — MAGNESIUM: Magnesium: 1.8 mg/dL (ref 1.7–2.4)

## 2021-07-20 MED ORDER — POTASSIUM CHLORIDE CRYS ER 20 MEQ PO TBCR
40.0000 meq | EXTENDED_RELEASE_TABLET | Freq: Once | ORAL | Status: AC
  Administered 2021-07-20: 40 meq via ORAL
  Filled 2021-07-20: qty 2

## 2021-07-20 MED ORDER — ISOSORBIDE MONONITRATE ER 30 MG PO TB24
30.0000 mg | ORAL_TABLET | Freq: Every day | ORAL | Status: DC
  Administered 2021-07-20 – 2021-07-24 (×5): 30 mg via ORAL
  Filled 2021-07-20 (×5): qty 1

## 2021-07-20 MED ORDER — BENZONATATE 100 MG PO CAPS
100.0000 mg | ORAL_CAPSULE | Freq: Three times a day (TID) | ORAL | Status: DC
  Administered 2021-07-20 – 2021-07-25 (×16): 100 mg via ORAL
  Filled 2021-07-20 (×16): qty 1

## 2021-07-20 MED ORDER — POTASSIUM CHLORIDE CRYS ER 20 MEQ PO TBCR
40.0000 meq | EXTENDED_RELEASE_TABLET | Freq: Once | ORAL | Status: AC
Start: 2021-07-20 — End: 2021-07-20
  Administered 2021-07-20: 40 meq via ORAL
  Filled 2021-07-20: qty 2

## 2021-07-20 MED ORDER — SPIRONOLACTONE 25 MG PO TABS
25.0000 mg | ORAL_TABLET | Freq: Once | ORAL | Status: AC
  Administered 2021-07-20: 25 mg via ORAL
  Filled 2021-07-20: qty 1

## 2021-07-20 MED ORDER — ACETAMINOPHEN 500 MG PO TABS
1000.0000 mg | ORAL_TABLET | Freq: Three times a day (TID) | ORAL | Status: DC
  Administered 2021-07-20 – 2021-07-27 (×22): 1000 mg via ORAL
  Filled 2021-07-20 (×22): qty 2

## 2021-07-20 MED ORDER — METOLAZONE 2.5 MG PO TABS
2.5000 mg | ORAL_TABLET | Freq: Once | ORAL | Status: AC
  Administered 2021-07-20: 2.5 mg via ORAL
  Filled 2021-07-20: qty 1

## 2021-07-20 MED ORDER — FUROSEMIDE 10 MG/ML IJ SOLN
60.0000 mg | Freq: Once | INTRAMUSCULAR | Status: AC
  Administered 2021-07-20: 60 mg via INTRAVENOUS
  Filled 2021-07-20: qty 6

## 2021-07-20 MED ORDER — FUROSEMIDE 10 MG/ML IJ SOLN
60.0000 mg | Freq: Once | INTRAMUSCULAR | Status: AC
Start: 2021-07-20 — End: 2021-07-20
  Administered 2021-07-20: 60 mg via INTRAVENOUS
  Filled 2021-07-20: qty 6

## 2021-07-20 MED ORDER — HYDRALAZINE HCL 50 MG PO TABS
100.0000 mg | ORAL_TABLET | Freq: Three times a day (TID) | ORAL | Status: DC
  Administered 2021-07-20 – 2021-07-27 (×21): 100 mg via ORAL
  Filled 2021-07-20 (×21): qty 2

## 2021-07-20 MED ORDER — METOLAZONE 2.5 MG PO TABS
2.5000 mg | ORAL_TABLET | Freq: Once | ORAL | Status: AC
Start: 2021-07-20 — End: 2021-07-20
  Administered 2021-07-20: 2.5 mg via ORAL
  Filled 2021-07-20: qty 1

## 2021-07-20 NOTE — Progress Notes (Signed)
No output measured today.  Spoke with pt and family about output.  Per daughter, the pt has not voided much today.  Pt voided 3x, all unmeasured.  Bathroom hat provided to pt and daughter.  Education provided on measuring urine output.  RN grateful for pt and family assistance.  Also, RN offered assistance and family declined.  Will continue to monitor and provide education as needed.

## 2021-07-20 NOTE — Progress Notes (Signed)
Palliative Medicine Inpatient Follow Up Note  HPI: Patient is a 88 y.o.  female with a history of COPD, chronic HFpEF, chronic hypoxic respiratory failure on 2 L of oxygen at home, DM-2, CKD stage IIIb-who was hospitalized from 6/24-6/26 for hypoglycemia in the setting of AKI-presented to the hospital on 6/29 with SOB/worsening lower extremity edema-found to have aspiration PNA and acute on chronic HFpEF.  Palliative care has been asked to get involved to further address goals of care in the setting of multiple chronic comorbid conditions and likelihood of poor outcomes with cardiopulmonary resuscitation.  Today's Discussion 07/20/2021  *Please note that this is a verbal dictation therefore any spelling or grammatical errors are due to the "[employer]" system interpretation.  Chart reviewed inclusive of vital signs, progress notes, laboratory results, and diagnostic images.   I met with Ayen and her daughter, [name] at bedside this morning.   Sharhonda shares she had another bad night. We reviewed that she was coughing for the vast majority of the night making it very hard to sleep. I shared that we could make her cough medicine around the clock. She has been on mucinex BID. Talked with RT to add a flutter valve to help with pulmonary hygiene.   Bowel movements have been less distressing in the setting of imodium.   Patient expresses improvement in anxiety with hydroxyzine PRN.   Hayli has been getting up daily and mobilizing as able. We reviewed the possibility of her going to CIR from the inpatient setting.   Discussed that there are still some active problems that need to be optimized prior to discharge planning though that will be deferred to the primary team.   Questions and concerns addressed   Palliative Support Provided  Objective Assessment: Vital Signs Vitals:   07/20/21 0830 07/20/21 1031  BP:    Pulse:    Resp:    Temp:    SpO2: 98% 94%    Intake/Output Summary  (Last 24 hours) at 07/20/2021 1136 Last data filed at 07/20/2021 0900 Gross per 24 hour  Intake 600 ml  Output 700 ml  Net -100 ml    Last Weight  Most recent update: 07/20/2021  4:29 AM    Weight  91.6 kg (201 lb 15.1 oz)            Gen: Elderly Caucasian female in no acute distress  HEENT: Dry mucous membranes CV: irregular rate and rhythm PULM: On 3L nasal cannula, tachypnea ABD: soft/nontender EXT: Bilateral lower extremity edema Neuro: Alert and oriented x3  SUMMARY OF RECOMMENDATIONS   DNAR/DNI   Continue to allow time for outcomes  Insomnia --> continue melatonin 10 mg nightly  Loose bowels Imodium PRN --> reviewed that patient can get more than 1 dose a day   Continue current plan of care --> goals are for continued improvement  Cough --> Tessalon TID, RT added flutter valve, continue mucinex  Buttock Pain --> Gerhardt cream, tylenol tid   Anxiety --> Continue hydroxyzine  Possible transition to CIR once medically optimized  Ongoing palliative care support  MDM - High ______________________________________________________________________________________ [place] Team Team Cell Phone: [phone] Please utilize secure chat with additional questions, if there is no response within 30 minutes please call the above phone number  Palliative Medicine Team providers are available by phone from 7am to 7pm daily and can be reached through the team cell phone.  Should this patient require assistance outside of these hours, please  call the patient's attending physician.

## 2021-07-20 NOTE — Progress Notes (Signed)
PROGRESS NOTE        PATIENT DETAILS Name: Rebecca Elliott Age: 88 y.o. Sex: female Date of Birth: 11-06-1926 Admit Date: 07/16/2021 Admitting Physician Evalee Mutton Kristeen Mans, MD PCP:Paz, Alda Berthold, MD  Brief Summary: Patient is a 88 y.o.  female with a history of COPD, chronic HFpEF, chronic hypoxic respiratory failure on 2 L of oxygen at home, DM-2, CKD stage IIIb-who was hospitalized from 6/24-6/26 for hypoglycemia in the setting of AKI-presented to the hospital on 6/29 with SOB/worsening lower extremity edema-found to have aspiration PNA and acute on chronic HFpEF.   Significant events: 6/29>> admit to [facility]-worsening hypoxemia due to aspiration PNA and acute on chronic HFpEF.  Significant studies: 6/29>> CXR: RLL PNA-pulmonary venous congestion 6/30>> Echo: EF 01-74%, grade 2 diastolic dysfunction, positive wall motion abnormalities.  Significant microbiology data: 6/29>> COVID PCR: Negative 6/29>> blood culture: Negative  Procedures:   Consults: None  Subjective:  Patient in chair, appears comfortable, denies any headache, no fever, no chest pain or pressure, will has shortness of breath and orthopnea,, no abdominal pain. No new focal weakness.    Objective: Vitals: Blood pressure (!) 153/67, pulse 82, temperature 98.5 F (36.9 C), temperature source Oral, resp. rate (!) 24, height '5\' 6"'$  (1.676 m), weight 91.6 kg, SpO2 94 %.   Exam:  Awake Alert, No new F.N deficits, Normal affect [place].AT,PERRAL Supple Neck, No JVD,   Symmetrical Chest wall movement, Good air movement bilaterally, ++ rales RRR,No Gallops, Rubs or new Murmurs,  +ve B.Sounds, Abd Soft, No tenderness,   No Cyanosis, Clubbing or edema    Assessment/Plan: Acute on chronic hypoxic respiratory failure due 2 acute on chronic diastolic CHF EF preserved at 60% with possible aspiration pneumonia: Overall still short of breath and evidence of fluid overload, will continue IV Lasix, dose  increased and metolazone added on 07/20/2021, continue empiric antibiotics but titrate Unasyn to Augmentin, encouraged to sit up in chair use I-S and flutter valve for pulmonary toiletry, supplemental oxygen advance activity and monitor.  This is more consistent with CHF than pneumonia.  Minimally elevated troponins: Trend is flat-not consistent with ACS.  Suspect this is due to demand ischemia.  Echo with stable EF but has some wall motion abnormalities-given advanced age-CKD stage IIIb-reasonable to manage this medically.   Hypokalemia: Replete and recheck.  CKD stage IIIb: Slight bump in creatinine but still not far from baseline-switching to oral diuretics today.  HTN: BP stable-metoprolol/hydralazine-losartan on hold to allow room for diuresis.    HLD: Continue statin  GERD: Continue PPI  COPD with chronic hypoxic respiratory failure on 2 L of oxygen (as needed):-Normally uses oxygen as needed-but since her discharge from her most recent hospitalization-she has been on 2 L of oxygen 24/7.  She has no evidence of exacerbation at this point-continue as needed bronchodilators.  OA bilateral knees: Supportive care   Chronic debility/deconditioning: Normally walks around the house with the help of a walker/cane-obtaining PT/OT eval.  Palliative care: DNR for now-family aware of poor overall long-term prognosis given development of aspiration pneumonia.  Plan is to diuresis much as possible-continue IV antibiotics and see how much she will improve.  Palliative care following.  DM-2 (A1c 6.0 on 3/14): CBG with borderline hypoglycemia this morning-stop SSI-watch closely.  Not a candidate for aggressive glycemic control.    Recent Labs    07/18/21 2116  07/19/21 2125 07/20/21 0805  GLUCAP 110* 179* 119*    Obesity: Estimated body mass index is 32.59 kg/m as calculated from the following:   Height as of this encounter: '5\' 6"'$  (1.676 m).   Weight as of this encounter: 91.6 kg.   Code  status:   Code Status: DNR   DVT Prophylaxis: heparin injection 5,000 Units Start: 07/16/21 1630   Family Communication: Daughter at bedside 07/19/21, 07/20/2021   Disposition Plan: Status is: Inpatient Remains inpatient appropriate because: Hypoxia due to aspiration PNA/CHF exacerbation-on IV antibiotics   Planned Discharge Destination:Home health   Diet: Diet Order             DIET DYS 3 Room service appropriate? Yes with Assist; Fluid consistency: Thin; Fluid restriction: 1200 mL Fluid  Diet effective now                    MEDICATIONS: Scheduled Meds:  acetaminophen  1,000 mg Oral TID   amoxicillin-clavulanate  1 tablet Oral Q12H   arformoterol  15 mcg Nebulization BID   atorvastatin  10 mg Oral Daily   benzonatate  100 mg Oral TID   budesonide (PULMICORT) nebulizer solution  0.25 mg Nebulization BID   Gerhardt's butt cream  1 Application Topical BID   guaiFENesin  600 mg Oral BID   heparin  5,000 Units Subcutaneous Q8H   hydrALAZINE  100 mg Oral TID   isosorbide mononitrate  30 mg Oral Daily   loratadine  10 mg Oral Daily   melatonin  10 mg Oral QHS   metoprolol succinate  100 mg Oral Daily   multivitamin with minerals  1 tablet Oral Daily   pantoprazole  40 mg Oral Daily   revefenacin  175 mcg Nebulization Daily   Continuous Infusions:   PRN Meds:.diclofenac Sodium, hydrALAZINE, hydrOXYzine, ipratropium-albuterol, loperamide, Muscle Rub, ondansetron **OR** ondansetron (ZOFRAN) IV, polyethylene glycol   I have personally reviewed following labs and imaging studies  LABORATORY DATA:  Recent Labs  Lab 07/16/21 1346 07/16/21 1750 07/17/21 0833 07/17/21 1443 07/19/21 0221 07/20/21 0010  WBC 21.6* 23.9* 25.0* 19.8* 9.7 9.8  HGB 9.9* 9.4* 10.1* 9.2* 8.9* 8.4*  HCT 31.5* 31.3* 31.8* 29.2* 28.8* 26.9*  PLT 320 303 314 284 312 324  MCV 75.0* 76.9* 74.6* 75.1* 75.4* 74.7*  MCH 23.6* 23.1* 23.7* 23.7* 23.3* 23.3*  MCHC 31.4 30.0 31.8 31.5 30.9 31.2  RDW  15.8* 15.8* 15.9* 15.9* 15.9* 15.6*  LYMPHSABS 1.9  --   --  2.2  --  1.8  MONOABS 1.8*  --   --  1.2*  --  0.8  EOSABS 0.0  --   --  0.0  --  0.0  BASOSABS 0.1  --   --  0.0  --  0.0    Recent Labs  Lab 07/16/21 1346 07/16/21 1750 07/17/21 0833 07/18/21 0158 07/19/21 0221 07/20/21 0010  NA 133*  --  134* 133* 133* 134*  K 4.5  --  3.7 3.1* 3.1* 3.7  CL 102  --  102 100 104 103  CO2 23  --  '24 24 23 24  '$ GLUCOSE 114*  --  70 100* 124* 121*  BUN 19  --  20 24* 22 22  CREATININE 1.66* 1.61* 1.75* 1.90* 1.79* 1.69*  CALCIUM 8.5*  --  8.3* 8.0* 8.2* 8.4*  AST 85*  --   --   --   --   --   ALT 14  --   --   --   --   --  ALKPHOS 50  --   --   --   --   --   BILITOT 0.4  --   --   --   --   --   ALBUMIN 2.4*  --   --   --   --   --   MG  --   --   --   --  1.7 1.8  BNP 1,829.9*  --   --   --  1,026.0* 1,572.9*    RADIOLOGY STUDIES/RESULTS: DG Chest 1 View  Result Date: 07/19/2021 CLINICAL DATA:  Shortness of breath. EXAM: CHEST  1 VIEW COMPARISON:  Portable chest 07/18/2021 FINDINGS: The heart is moderately enlarged. There is increased perihilar vascular congestion, increased interstitial edema in the central and lower lungs, and increased moderate pleural effusions. There are hazy opacities in both lower zones which could be atelectasis, pneumonia, ground-glass edema or combination. The upper lung fields remain clear. There is aortic uncoiling and calcification with stable mediastinum. Osteopenia and mild thoracic dextroscoliosis. IMPRESSION: 1. Increased findings of CHF or fluid overload. 2. Increased moderate pleural effusions and overlying hazy lung opacities. 3. Clinical correlation and radiographic follow-up recommended. 4. Aortic atherosclerosis and uncoiling. Electronically Signed   By: Telford Nab M.D.   On: 07/19/2021 23:18     LOS: 4 days   Signature  Lala Lund M.D on 07/20/2021 at 10:34 AM   -  To page go to [email or web address]

## 2021-07-21 DIAGNOSIS — Z66 Do not resuscitate: Secondary | ICD-10-CM

## 2021-07-21 DIAGNOSIS — I509 Heart failure, unspecified: Secondary | ICD-10-CM | POA: Diagnosis not present

## 2021-07-21 DIAGNOSIS — J189 Pneumonia, unspecified organism: Secondary | ICD-10-CM | POA: Diagnosis not present

## 2021-07-21 DIAGNOSIS — Z711 Person with feared health complaint in whom no diagnosis is made: Secondary | ICD-10-CM

## 2021-07-21 DIAGNOSIS — R638 Other symptoms and signs concerning food and fluid intake: Secondary | ICD-10-CM

## 2021-07-21 DIAGNOSIS — Z789 Other specified health status: Secondary | ICD-10-CM

## 2021-07-21 DIAGNOSIS — Z515 Encounter for palliative care: Secondary | ICD-10-CM | POA: Diagnosis not present

## 2021-07-21 DIAGNOSIS — J411 Mucopurulent chronic bronchitis: Secondary | ICD-10-CM | POA: Diagnosis not present

## 2021-07-21 DIAGNOSIS — N1832 Chronic kidney disease, stage 3b: Secondary | ICD-10-CM | POA: Diagnosis not present

## 2021-07-21 LAB — CBC WITH DIFFERENTIAL/PLATELET
Abs Immature Granulocytes: 0.08 10*3/uL — ABNORMAL HIGH (ref 0.00–0.07)
Basophils Absolute: 0.1 10*3/uL (ref 0.0–0.1)
Basophils Relative: 1 %
Eosinophils Absolute: 0.2 10*3/uL (ref 0.0–0.5)
Eosinophils Relative: 2 %
HCT: 28.7 % — ABNORMAL LOW (ref 36.0–46.0)
Hemoglobin: 8.8 g/dL — ABNORMAL LOW (ref 12.0–15.0)
Immature Granulocytes: 1 %
Lymphocytes Relative: 17 %
Lymphs Abs: 1.7 10*3/uL (ref 0.7–4.0)
MCH: 23.3 pg — ABNORMAL LOW (ref 26.0–34.0)
MCHC: 30.7 g/dL (ref 30.0–36.0)
MCV: 76.1 fL — ABNORMAL LOW (ref 80.0–100.0)
Monocytes Absolute: 0.8 10*3/uL (ref 0.1–1.0)
Monocytes Relative: 9 %
Neutro Abs: 6.9 10*3/uL (ref 1.7–7.7)
Neutrophils Relative %: 70 %
Platelets: 332 10*3/uL (ref 150–400)
RBC: 3.77 MIL/uL — ABNORMAL LOW (ref 3.87–5.11)
RDW: 15.9 % — ABNORMAL HIGH (ref 11.5–15.5)
WBC: 9.7 10*3/uL (ref 4.0–10.5)
nRBC: 0 % (ref 0.0–0.2)

## 2021-07-21 LAB — BASIC METABOLIC PANEL
Anion gap: 10 (ref 5–15)
BUN: 24 mg/dL — ABNORMAL HIGH (ref 8–23)
CO2: 25 mmol/L (ref 22–32)
Calcium: 8.8 mg/dL — ABNORMAL LOW (ref 8.9–10.3)
Chloride: 100 mmol/L (ref 98–111)
Creatinine, Ser: 1.8 mg/dL — ABNORMAL HIGH (ref 0.44–1.00)
GFR, Estimated: 26 mL/min — ABNORMAL LOW (ref >60)
Glucose, Bld: 118 mg/dL — ABNORMAL HIGH (ref 70–99)
Potassium: 3.7 mmol/L (ref 3.5–5.1)
Sodium: 135 mmol/L (ref 135–145)

## 2021-07-21 LAB — CULTURE, BLOOD (ROUTINE X 2): Culture: NO GROWTH

## 2021-07-21 LAB — MAGNESIUM: Magnesium: 1.5 mg/dL — ABNORMAL LOW (ref 1.7–2.4)

## 2021-07-21 LAB — PROCALCITONIN: Procalcitonin: >150 ng/mL

## 2021-07-21 LAB — BRAIN NATRIURETIC PEPTIDE: B Natriuretic Peptide: 1805 pg/mL — ABNORMAL HIGH (ref 0.0–100.0)

## 2021-07-21 MED ORDER — MAGNESIUM SULFATE 4 GM/100ML IV SOLN
4.0000 g | Freq: Once | INTRAVENOUS | Status: AC
Start: 2021-07-21 — End: 2021-07-21
  Administered 2021-07-21: 4 g via INTRAVENOUS
  Filled 2021-07-21: qty 100

## 2021-07-21 MED ORDER — AMLODIPINE BESYLATE 10 MG PO TABS
10.0000 mg | ORAL_TABLET | Freq: Every day | ORAL | Status: DC
  Administered 2021-07-21 – 2021-07-27 (×7): 10 mg via ORAL
  Filled 2021-07-21 (×7): qty 1

## 2021-07-21 MED ORDER — MAGNESIUM SULFATE IN D5W 1-5 GM/100ML-% IV SOLN
1.0000 g | Freq: Once | INTRAVENOUS | Status: AC
  Administered 2021-07-21: 1 g via INTRAVENOUS
  Filled 2021-07-21: qty 100

## 2021-07-21 MED ORDER — METOLAZONE 5 MG PO TABS
5.0000 mg | ORAL_TABLET | Freq: Once | ORAL | Status: AC
  Administered 2021-07-21: 5 mg via ORAL
  Filled 2021-07-21: qty 1

## 2021-07-21 MED ORDER — SPIRONOLACTONE 25 MG PO TABS
50.0000 mg | ORAL_TABLET | Freq: Once | ORAL | Status: AC
  Administered 2021-07-21: 50 mg via ORAL
  Filled 2021-07-21: qty 2

## 2021-07-21 MED ORDER — FUROSEMIDE 10 MG/ML IJ SOLN
120.0000 mg | Freq: Once | INTRAVENOUS | Status: AC
  Administered 2021-07-21: 120 mg via INTRAVENOUS
  Filled 2021-07-21: qty 10

## 2021-07-21 NOTE — Progress Notes (Signed)
PROGRESS NOTE        PATIENT DETAILS Name: Rebecca Elliott Age: 88 y.o. Sex: female Date of Birth: Jul 31, 1926 Admit Date: 07/16/2021 Admitting Physician Evalee Mutton Kristeen Mans, MD PCP:Paz, Alda Berthold, MD  Brief Summary: Patient is a 88 y.o.  female with a history of COPD, chronic HFpEF, chronic hypoxic respiratory failure on 2 L of oxygen at home, DM-2, CKD stage IIIb-who was hospitalized from 6/24-6/26 for hypoglycemia in the setting of AKI-presented to the hospital on 6/29 with SOB/worsening lower extremity edema-found to have aspiration PNA and acute on chronic HFpEF.   Significant events: 6/29>> admit to [facility]-worsening hypoxemia due to aspiration PNA and acute on chronic HFpEF.  Significant studies: 6/29>> CXR: RLL PNA-pulmonary venous congestion 6/30>> Echo: EF 37-04%, grade 2 diastolic dysfunction, positive wall motion abnormalities.  Significant microbiology data: 6/29>> COVID PCR: Negative 6/29>> blood culture: Negative  Procedures:   Consults: None  Subjective:  Patient in recliner, appears comfortable, denies any headache, no fever, no chest pain or pressure, some improvement in shortness of breath , no abdominal pain. No new focal weakness.   Objective: Vitals: Blood pressure (!) 168/68, pulse 88, temperature 98 F (36.7 C), temperature source Oral, resp. rate (!) 25, height '5\' 6"'$  (1.676 m), weight 91.6 kg, SpO2 98 %.   Exam:  Awake Alert, No new F.N deficits, Normal affect [place].AT,PERRAL Supple Neck, No JVD,   Symmetrical Chest wall movement, Good air movement bilaterally, + rales RRR,No Gallops, Rubs or new Murmurs,  +ve B.Sounds, Abd Soft, No tenderness,   No Cyanosis, Clubbing or edema     Assessment/Plan:  Acute on chronic hypoxic respiratory failure due 2 acute on chronic diastolic CHF EF preserved at 60% with possible aspiration pneumonia: Overall still short of breath and evidence of fluid overload, unfortunately she is not  responding well to high dose diuretics, will try 120 mg of IV Lasix with metolazone and Aldactone on 07/21/2021, if she does not diurese well and hypoxia and shortness of breath continue we will look into transitioning to comfort care on 07/22/2021, continue empiric antibiotics but titrate Unasyn to Augmentin, encouraged to sit up in chair use I-S and flutter valve for pulmonary toiletry, supplemental oxygen advance activity and monitor.  This is more consistent with CHF than pneumonia.  Minimally elevated troponins: Trend is flat-not consistent with ACS.  Suspect this is due to demand ischemia.  Echo with stable EF but has some wall motion abnormalities-given advanced age-CKD stage IIIb-reasonable to manage this medically.   Hypokalemia and hypomagnesemia: Repleted and recheck.  CKD stage IIIb: Slight bump in creatinine but still not far from baseline-switching to oral diuretics today.  HTN: BP stable-metoprolol/hydralazine-losartan on hold to allow room for diuresis.    HLD: Continue statin  GERD: Continue PPI  COPD with chronic hypoxic respiratory failure on 2 L of oxygen (as needed):-Normally uses oxygen as needed-but since her discharge from her most recent hospitalization-she has been on 2 L of oxygen 24/7.  She has no evidence of exacerbation at this point-continue as needed bronchodilators.  OA bilateral knees: Supportive care   Chronic debility/deconditioning: Normally walks around the house with the help of a walker/cane-obtaining PT/OT eval.  Palliative care: DNR for now-family aware of poor overall long-term prognosis given development of aspiration pneumonia.  Plan is to diuresis much as possible-continue IV antibiotics and see how much she  will improve.  Palliative care following.  DM-2 (A1c 6.0 on 3/14): CBG with borderline hypoglycemia this morning-stop SSI-watch closely.  Not a candidate for aggressive glycemic control.    Recent Labs    07/20/21 1147 07/20/21 1836  07/20/21 2141  GLUCAP 186* 180* 143*    Obesity: Estimated body mass index is 32.59 kg/m as calculated from the following:   Height as of this encounter: '5\' 6"'$  (1.676 m).   Weight as of this encounter: 91.6 kg.   Code status:   Code Status: DNR   DVT Prophylaxis: heparin injection 5,000 Units Start: 07/16/21 1630   Family Communication: Daughter at bedside 07/19/21, 07/20/2021, 07/21/2021   Disposition Plan: Status is: Inpatient Remains inpatient appropriate because: Hypoxia due to aspiration PNA/CHF exacerbation-on IV antibiotics   Planned Discharge Destination:Home health   Diet: Diet Order             DIET DYS 3 Room service appropriate? Yes with Assist; Fluid consistency: Thin; Fluid restriction: 1200 mL Fluid  Diet effective now                    MEDICATIONS: Scheduled Meds:  acetaminophen  1,000 mg Oral TID   amLODipine  10 mg Oral Daily   amoxicillin-clavulanate  1 tablet Oral Q12H   arformoterol  15 mcg Nebulization BID   atorvastatin  10 mg Oral Daily   benzonatate  100 mg Oral TID   budesonide (PULMICORT) nebulizer solution  0.25 mg Nebulization BID   Gerhardt's butt cream  1 Application Topical BID   guaiFENesin  600 mg Oral BID   heparin  5,000 Units Subcutaneous Q8H   hydrALAZINE  100 mg Oral TID   isosorbide mononitrate  30 mg Oral Daily   loratadine  10 mg Oral Daily   melatonin  10 mg Oral QHS   metolazone  5 mg Oral Once   metoprolol succinate  100 mg Oral Daily   multivitamin with minerals  1 tablet Oral Daily   pantoprazole  40 mg Oral Daily   revefenacin  175 mcg Nebulization Daily   Continuous Infusions:  furosemide     magnesium sulfate bolus IVPB     magnesium sulfate bolus IVPB      PRN Meds:.diclofenac Sodium, hydrALAZINE, hydrOXYzine, ipratropium-albuterol, loperamide, Muscle Rub, ondansetron **OR** ondansetron (ZOFRAN) IV, polyethylene glycol   I have personally reviewed following labs and imaging studies  LABORATORY  DATA:  Recent Labs  Lab 07/16/21 1346 07/16/21 1750 07/17/21 0833 07/17/21 1443 07/19/21 0221 07/20/21 0010 07/21/21 0307  WBC 21.6*   < > 25.0* 19.8* 9.7 9.8 9.7  HGB 9.9*   < > 10.1* 9.2* 8.9* 8.4* 8.8*  HCT 31.5*   < > 31.8* 29.2* 28.8* 26.9* 28.7*  PLT 320   < > 314 284 312 324 332  MCV 75.0*   < > 74.6* 75.1* 75.4* 74.7* 76.1*  MCH 23.6*   < > 23.7* 23.7* 23.3* 23.3* 23.3*  MCHC 31.4   < > 31.8 31.5 30.9 31.2 30.7  RDW 15.8*   < > 15.9* 15.9* 15.9* 15.6* 15.9*  LYMPHSABS 1.9  --   --  2.2  --  1.8 1.7  MONOABS 1.8*  --   --  1.2*  --  0.8 0.8  EOSABS 0.0  --   --  0.0  --  0.0 0.2  BASOSABS 0.1  --   --  0.0  --  0.0 0.1   < > = values in this  interval not displayed.    Recent Labs  Lab 07/16/21 1346 07/16/21 1750 07/17/21 0833 07/18/21 0158 07/19/21 0221 07/20/21 0010 07/21/21 0307  NA 133*  --  134* 133* 133* 134* 135  K 4.5  --  3.7 3.1* 3.1* 3.7 3.7  CL 102  --  102 100 104 103 100  CO2 23  --  '24 24 23 24 25  '$ GLUCOSE 114*  --  70 100* 124* 121* 118*  BUN 19  --  20 24* 22 22 24*  CREATININE 1.66*   < > 1.75* 1.90* 1.79* 1.69* 1.80*  CALCIUM 8.5*  --  8.3* 8.0* 8.2* 8.4* 8.8*  AST 85*  --   --   --   --   --   --   ALT 14  --   --   --   --   --   --   ALKPHOS 50  --   --   --   --   --   --   BILITOT 0.4  --   --   --   --   --   --   ALBUMIN 2.4*  --   --   --   --   --   --   MG  --   --   --   --  1.7 1.8 1.5*  PROCALCITON  --   --   --   --   --  >150.00 >150.00  BNP 1,829.9*  --   --   --  1,026.0* 1,572.9* 1,805.0*   < > = values in this interval not displayed.    RADIOLOGY STUDIES/RESULTS: DG Chest 1 View  Result Date: 07/19/2021 CLINICAL DATA:  Shortness of breath. EXAM: CHEST  1 VIEW COMPARISON:  Portable chest 07/18/2021 FINDINGS: The heart is moderately enlarged. There is increased perihilar vascular congestion, increased interstitial edema in the central and lower lungs, and increased moderate pleural effusions. There are hazy opacities in  both lower zones which could be atelectasis, pneumonia, ground-glass edema or combination. The upper lung fields remain clear. There is aortic uncoiling and calcification with stable mediastinum. Osteopenia and mild thoracic dextroscoliosis. IMPRESSION: 1. Increased findings of CHF or fluid overload. 2. Increased moderate pleural effusions and overlying hazy lung opacities. 3. Clinical correlation and radiographic follow-up recommended. 4. Aortic atherosclerosis and uncoiling. Electronically Signed   By: Telford Nab M.D.   On: 07/19/2021 23:18     LOS: 5 days   Signature  Lala Lund M.D on 07/21/2021 at 10:19 AM   -  To page go to [email or web address]

## 2021-07-21 NOTE — Progress Notes (Signed)
Inpatient Rehab Admissions Coordinator:   Following for my colleague, Gayland Curry.  Please contact me with any questions.    Shann Medal, PT, DPT Admissions Coordinator [phone] 07/21/21  10:04 AM

## 2021-07-21 NOTE — Progress Notes (Signed)
TED hose placed on pts BLE.  Pt states her legs feel better with TED hose.  Will continue to monitor and provide support as needed.

## 2021-07-21 NOTE — Progress Notes (Addendum)
Daily Progress Note   Patient Name: Rebecca Elliott       Date: 07/21/2021 DOB: 11/20/1926  Age: 88 y.o. MRN#: [id number] Attending Physician: Thurnell Lose, MD Primary Care Physician: Colon Branch, MD Admit Date: 07/16/2021  Reason for Consultation/Follow-up: Establishing goals of care  Subjective: Chart review performed.  Per previous notes, patient is still observed to be in fluid overload indicating she is not responding well to high-dose diuretics.  Medication adjustments today -if she still does not diurese well and hypoxia and shortness of breath continue will likely need to look at transitioning to comfort care. Received report from primary RN -no acute concerns.  RN reports patient's oral intake is "ok."  Albumin noted at 2.4 on 07/16/2021.  Went to visit patient at bedside -daughter/HCPOA/[name] present.  Patient was sitting up in chair asleep -she does briefly wake to voice/gentle touch but falls back asleep and remains asleep for majority of visit.  When awake she does report improvement in cough. No signs or non-verbal gestures of pain or discomfort noted. No respiratory distress, increased work of breathing, or secretions noted.  She is on chronic 2 L O2 nasal cannula  Daughter reports patient had another "lousy night" last night due to urinary frequency.  Daughter expressed concern that loose stools and patient getting up/down to the bathroom are making patient fatigued.  MAR reviewed -its been over 24 hours since patient's last Imodium dose.  Encouraged patient/family to utilize medication as needed.  Daughter reports medication worked well previous nights when patient received multiple doses. Daughter does report patient's cough has improved with initiation of scheduled medications.    Reviewed with daughter that, unfortunately, patient does not seem to be responding well to high-dose diuretics.  Education provided on medication adjustments initiated today in hopes of better response.  Reviewed that it remains uncertain if patient will respond to changes in medical management.  Reviewed anticipated trajectories if patient does respond to medication changes (possibility she could continue to pursue CIR/rehab) versus does not respond.  Daughter understands that if patient shows no improvement over the next 24 hours comfort/hospice care will be recommended.  She is understandably tearful -emotional support provided.  Therapeutic listening provided as she reflects on conversations with attending today and patient's PCP yesterday (which they trust) on possibility patient may be  looking at end-of-life.  Prognostication reviewed.  Briefly reviewed home versus residential hospice options.  Did not go into full detail. Per gentle discussions today, daughter feels patient would want to go home with hospice and family would be able to support her 24/7 needs.  DME reviewed.  All questions and concerns addressed. Encouraged to call with questions and/or concerns. PMT card provided.  Length of Stay: 5  Current Medications: Scheduled Meds:   acetaminophen  1,000 mg Oral TID   amLODipine  10 mg Oral Daily   amoxicillin-clavulanate  1 tablet Oral Q12H   arformoterol  15 mcg Nebulization BID   atorvastatin  10 mg Oral Daily   benzonatate  100 mg Oral TID   budesonide (PULMICORT) nebulizer solution  0.25 mg Nebulization BID   Gerhardt's butt cream  1 Application Topical BID   guaiFENesin  600 mg Oral BID   heparin  5,000 Units Subcutaneous Q8H   hydrALAZINE  100 mg Oral TID   isosorbide mononitrate  30 mg Oral Daily   loratadine  10 mg Oral Daily   melatonin  10 mg Oral QHS   metolazone  5 mg Oral Once   metoprolol succinate  100 mg Oral Daily   multivitamin with minerals  1 tablet Oral  Daily   pantoprazole  40 mg Oral Daily   revefenacin  175 mcg Nebulization Daily    Continuous Infusions:  furosemide     magnesium sulfate bolus IVPB     magnesium sulfate bolus IVPB      PRN Meds: diclofenac Sodium, hydrALAZINE, hydrOXYzine, ipratropium-albuterol, loperamide, Muscle Rub, ondansetron **OR** ondansetron (ZOFRAN) IV, polyethylene glycol  Physical Exam Vitals and nursing note reviewed.  Constitutional:      General: She is not in acute distress.    Appearance: She is cachectic. She is ill-appearing.  Pulmonary:     Effort: No respiratory distress.  Skin:    General: Skin is warm and dry.  Neurological:     Mental Status: She is oriented to person, place, and time. She is lethargic.     Motor: Weakness present.  Psychiatric:        Attention and Perception: Attention normal.        Behavior: Behavior is cooperative.        Cognition and Memory: Cognition and memory normal.             Vital Signs: BP (!) 168/68 (BP Location: Right Arm)   Pulse 88   Temp 98 F (36.7 C) (Oral)   Resp (!) 25   Ht '5\' 6"'$  (1.676 m)   Wt 91.6 kg   SpO2 98%   BMI 32.59 kg/m  SpO2: SpO2: 98 % O2 Device: O2 Device: Nasal Cannula O2 Flow Rate: O2 Flow Rate (L/min): 2 L/min  Intake/output summary:  Intake/Output Summary (Last 24 hours) at 07/21/2021 1029 Last data filed at 07/21/2021 0512 Gross per 24 hour  Intake --  Output 1450 ml  Net -1450 ml   LBM: Last BM Date : 07/20/21 Baseline Weight: Weight: 90 kg Most recent weight: Weight: 91.6 kg       Palliative Assessment/Data: PPS 30%      Patient Active Problem List   Diagnosis Date Noted   PNA (pneumonia) 07/16/2021   Prediabetes 07/13/2021   AKI superimposed on CKD stage IIIb ([place]) 07/12/2021   Hypoglycemia 07/12/2021   Acute respiratory failure with hypoxia (HCC) 11/11/2020   Obesity (BMI 30-39.9) 11/08/2020   CHF (congestive heart failure) ([place]) 11/08/2020  Hypokalemia 11/05/2020   Hypomagnesemia  11/05/2020   SOB (shortness of breath) 11/05/2020   CKD stage IIIb ([place]) 11/05/2020   History of anemia due to CKD 11/05/2020   Macular pucker, right eye 05/01/2020   Asthma 12/06/2019   Early stage nonexudative age-related macular degeneration of both eyes 09/25/2019   Branch retinal vein occlusion with macular edema of left eye 06/12/2019   Retinal telangiectasia of left eye 04/23/2019   Snores 04/23/2019   Primary osteoarthritis of right knee 06/22/2016   PCP NOTES >>>>>>>>>>>>> 11/19/2014   Abdominal lymphadenopathy 08/09/2014   Pulmonary nodules 05/01/2014   Allergic rhinitis 04/18/2014   Tremor 09/26/2013   Elevated LFTs 08/13/2011   Annual physical exam 07/10/2010   Osteoarthritis 07/08/2009   COLONIC POLYPS, ADENOMATOUS, HX OF 08/14/2008   ALLERGIC RHINITIS 05/02/2007   Type 2 diabetes mellitus with diabetic neuropathy, unspecified ([place]) 12/12/2006   Hyperlipidemia 10/10/2006   DEPRESSION 10/10/2006   GERD 10/10/2006   HIATAL HERNIA WITH REFLUX 10/10/2006   Essential hypertension 03/13/2006   COPD  03/13/2006    Palliative Care Assessment & Plan   Patient Profile: Patient is a 88 y.o.  female with a history of COPD, chronic HFpEF, chronic hypoxic respiratory failure on 2 L of oxygen at home, DM-2, CKD stage IIIb-who was hospitalized from 6/24-6/26 for hypoglycemia in the setting of AKI-presented to the hospital on 6/29 with SOB/worsening lower extremity edema-found to have aspiration PNA and acute on chronic HFpEF.  Palliative care has been asked to get involved to further address goals of care in the setting of multiple chronic comorbid conditions and likelihood of poor outcomes with cardiopulmonary resuscitation.  Assessment: Principal Problem:   PNA (pneumonia) Active Problems:   Type 2 diabetes mellitus with diabetic neuropathy, unspecified (HCC)   Hyperlipidemia   Essential hypertension   COPD    CKD stage IIIb (HCC)   Obesity (BMI  30-39.9)   Recommendations/Plan: Continue to treat the treatable with 24 hours of watchful waiting - if no improvements family open to discussions on comfort/hospice care tomorrow 7/5 Continue DNR/DNI as previously documented - copy of durable DNR was made and will be scanned into Vyn[facility]/ACP tab Ongoing goals of care discussions pending clinical course  Continue current symptom management plan - no changes today PMT will continue to follow and support holistically  Symptom Management Melatonin nightly for insomnia Imodium PRN loose stool Tessalon Perle, flutter valve, Mucinex for cough Gerhardt cream and scheduled Tylenol for buttock pain Hydroxyzine PRN anxiety  Goals of Care and Additional Recommendations: Limitations on Scope of Treatment: Full Scope Treatment  Code Status:    Code Status Orders  (From admission, onward)           Start     Ordered   07/17/21 1712  Do not attempt resuscitation (DNR)  Continuous       Question Answer Comment  In the event of cardiac or respiratory ARREST Do not call a "code blue"   In the event of cardiac or respiratory ARREST Do not perform Intubation, CPR, defibrillation or ACLS   In the event of cardiac or respiratory ARREST Use medication by any route, position, wound care, and other measures to relive pain and suffering. May use oxygen, suction and manual treatment of airway obstruction as needed for comfort.      07/17/21 1711           Code Status History     Date Active Date Inactive Code Status Order ID Comments User Context  07/16/2021 1622 07/17/2021 1711 Full Code [id number]  Jonetta Osgood, MD ED   07/12/2021 0149 07/13/2021 1515 Full Code [id number]  Etta Quill, DO ED   11/05/2020 2109 11/12/2020 2154 Full Code [id number]  Howerter, Ethelda Chick, DO Inpatient       Prognosis:  Unable to determine  Discharge Planning: To Be Determined  Care plan was discussed with primary RN, patient, patient's  daughter/HCPOA  Thank you for allowing the Palliative Medicine Team to assist in the care of this patient.   Total Time 60 minutes Prolonged Time Billed  no       Greater than 50%  of this time was spent counseling and coordinating care related to the above assessment and plan.  Lin Landsman, NP  Please contact Palliative Medicine Team phone at [phone] for questions and concerns.    *Portions of this note are a verbal dictation therefore any spelling and/or grammatical errors are due to the "[place] [employer]" system interpretation.

## 2021-07-21 NOTE — Progress Notes (Signed)
Speech Language Pathology Treatment: Dysphagia  Patient Details Name: Rebecca Elliott MRN: [id number] DOB: Apr 23, 1926 Today's Date: 07/21/2021 Time: 1215-1230 SLP Time Calculation (min) (ACUTE ONLY): 15 min  Assessment / Plan / Recommendation Clinical Impression  New speech swallow order received as patient requesting food items that are not available on Dys 3 solids menu at the hospital but patient already on caseload so new eval not needed. SLP spoke with patient and her daughter regarding diet consistencies and options. Patient reported she is fairly content with current diet but would prefer to have items such as sandwiches that are not cut up into small pieces. She does not have dentures and per daughter's report, she does not wear them much at all and they are not needed for eating. SLP observed patient with multiple straw sips of water and no overt s/s aspiration or penetration observed, no change in vitals or SpO2. SLP recommended to patient and daughter that we upgrade to regular texture diet and they can choose foods that she is able to tolerate. SLP recommended avoiding foods that are: hard, crunchy, tough, dense and for patient to take her time, take rest breaks when needed. Patient and daughter both verbalized understanding and all in agreement with plan. SLP to s/o at this time.   HPI HPI: Pt is a 88 y.o. female who presented to the ED with worsening lower extremity swelling and shortness of breath. On admission, pt's daughter reported that she has noticed the pt coughing with meals. CXR on admission: scattered right-greater-than-left basilar airspace disease is most likely atelectasis. At the right lung base, pneumonia or aspiration cannot be excluded. PMH: COPD, chronic HFpEF, chronic hypoxic respiratory failure on home O2 2 L (as needed), DM-2, CKD stage IIIb who was recently hospitalized from 6/24-6/26 for hypoglycemia in the setting of AKI      SLP Plan  All goals met;Discharge SLP  treatment due to (comment)      Recommendations for follow up therapy are one component of a multi-disciplinary discharge planning process, led by the attending physician.  Recommendations may be updated based on patient status, additional functional criteria and insurance authorization.    Recommendations  Diet recommendations: Regular;Thin liquid Liquids provided via: Cup;Straw Medication Administration: Whole meds with puree Supervision: Patient able to self feed Compensations: Slow rate;Small sips/bites Postural Changes and/or Swallow Maneuvers: Seated upright 90 degrees;Out of bed for meals                Oral Care Recommendations: Oral care BID Follow Up Recommendations: No SLP follow up Assistance recommended at discharge: None SLP Visit Diagnosis: Dysphagia, unspecified (R13.10) Plan: All goals met;Discharge SLP treatment due to (comment)       Sonia Baller, MA, CCC-SLP Speech Therapy

## 2021-07-22 DIAGNOSIS — Z515 Encounter for palliative care: Secondary | ICD-10-CM | POA: Diagnosis not present

## 2021-07-22 LAB — BASIC METABOLIC PANEL
Anion gap: 8 (ref 5–15)
BUN: 25 mg/dL — ABNORMAL HIGH (ref 8–23)
CO2: 26 mmol/L (ref 22–32)
Calcium: 8.5 mg/dL — ABNORMAL LOW (ref 8.9–10.3)
Chloride: 99 mmol/L (ref 98–111)
Creatinine, Ser: 1.73 mg/dL — ABNORMAL HIGH (ref 0.44–1.00)
GFR, Estimated: 27 mL/min — ABNORMAL LOW (ref >60)
Glucose, Bld: 106 mg/dL — ABNORMAL HIGH (ref 70–99)
Potassium: 3.1 mmol/L — ABNORMAL LOW (ref 3.5–5.1)
Sodium: 133 mmol/L — ABNORMAL LOW (ref 135–145)

## 2021-07-22 LAB — GLUCOSE, CAPILLARY
Glucose-Capillary: 244 mg/dL — ABNORMAL HIGH (ref 70–99)
Glucose-Capillary: 177 mg/dL — ABNORMAL HIGH (ref 70–99)

## 2021-07-22 LAB — CBC WITH DIFFERENTIAL/PLATELET
Abs Immature Granulocytes: 0.09 10*3/uL — ABNORMAL HIGH (ref 0.00–0.07)
Basophils Absolute: 0.1 10*3/uL (ref 0.0–0.1)
Basophils Relative: 1 %
Eosinophils Absolute: 0.2 10*3/uL (ref 0.0–0.5)
Eosinophils Relative: 2 %
HCT: 29.2 % — ABNORMAL LOW (ref 36.0–46.0)
Hemoglobin: 9.1 g/dL — ABNORMAL LOW (ref 12.0–15.0)
Immature Granulocytes: 1 %
Lymphocytes Relative: 25 %
Lymphs Abs: 2.5 10*3/uL (ref 0.7–4.0)
MCH: 23 pg — ABNORMAL LOW (ref 26.0–34.0)
MCHC: 31.2 g/dL (ref 30.0–36.0)
MCV: 73.7 fL — ABNORMAL LOW (ref 80.0–100.0)
Monocytes Absolute: 0.8 10*3/uL (ref 0.1–1.0)
Monocytes Relative: 8 %
Neutro Abs: 6.3 10*3/uL (ref 1.7–7.7)
Neutrophils Relative %: 63 %
Platelets: 346 10*3/uL (ref 150–400)
RBC: 3.96 MIL/uL (ref 3.87–5.11)
RDW: 15.6 % — ABNORMAL HIGH (ref 11.5–15.5)
WBC: 9.9 10*3/uL (ref 4.0–10.5)
nRBC: 0 % (ref 0.0–0.2)

## 2021-07-22 LAB — BRAIN NATRIURETIC PEPTIDE: B Natriuretic Peptide: 1851.9 pg/mL — ABNORMAL HIGH (ref 0.0–100.0)

## 2021-07-22 LAB — MAGNESIUM: Magnesium: 2.5 mg/dL — ABNORMAL HIGH (ref 1.7–2.4)

## 2021-07-22 LAB — PROCALCITONIN: Procalcitonin: >150 ng/mL

## 2021-07-22 MED ORDER — METOLAZONE 5 MG PO TABS: 5.0000 mg | ORAL_TABLET | Freq: Once | ORAL | Status: DC

## 2021-07-22 MED ORDER — METOLAZONE 5 MG PO TABS
10.0000 mg | ORAL_TABLET | Freq: Once | ORAL | Status: AC
Start: 2021-07-22 — End: 2021-07-22
  Administered 2021-07-22: 10 mg via ORAL
  Filled 2021-07-22: qty 2

## 2021-07-22 MED ORDER — FUROSEMIDE 10 MG/ML IJ SOLN
120.0000 mg | Freq: Once | INTRAVENOUS | Status: AC
  Administered 2021-07-22: 120 mg via INTRAVENOUS
  Filled 2021-07-22: qty 10

## 2021-07-22 MED ORDER — POTASSIUM CHLORIDE CRYS ER 20 MEQ PO TBCR
40.0000 meq | EXTENDED_RELEASE_TABLET | Freq: Two times a day (BID) | ORAL | Status: AC
  Administered 2021-07-22 (×2): 40 meq via ORAL
  Filled 2021-07-22 (×2): qty 2

## 2021-07-22 MED ORDER — SPIRONOLACTONE 25 MG PO TABS
50.0000 mg | ORAL_TABLET | Freq: Once | ORAL | Status: AC
  Administered 2021-07-22: 50 mg via ORAL
  Filled 2021-07-22: qty 2

## 2021-07-22 NOTE — Progress Notes (Signed)
Daily Progress Note   Patient Name: Rebecca Elliott       Date: 07/22/2021 DOB: 01-08-1927  Age: 88 y.o. MRN#: [id number] Attending Physician: Thurnell Lose, MD Primary Care Physician: Colon Branch, MD Admit Date: 07/16/2021  Reason for Consultation/Follow-up: Establishing goals of care  Subjective: Medical records reviewed. Patient assessed at the bedside.  She is sitting up on the edge of her bed reporting left hip pain.  Slept poorly again last night.  Her daughter is present at the bedside.  Patient and family shares she is awaiting nurse to bring a donut for relief of her hip pain.  She has already tried Tylenol.  She attributes this to decreased mobility.  They are wondering about possible evaluation for CIR and we discussed that admissions review process will depend on her continued progress.  Patient and her daughter understand that she still has fluid in her lungs and her condition is still tenuous.  She has some improvement in shortness of breath after yesterday's medications.  We reviewed plan to try these medicines again today and they verbalized understanding.  They are hopeful for continued improvement and agreeable to continue goals of care conversation tomorrow based on how she responds.  All questions and concerns addressed. Encouraged to call with questions and/or concerns. PMT will continue to support holistically.   Length of Stay: 6  Physical Exam Vitals and nursing note reviewed.  Constitutional:      General: She is not in acute distress.    Appearance: She is cachectic. She is ill-appearing.  Pulmonary:     Effort: No respiratory distress.  Skin:    General: Skin is warm and dry.  Neurological:     Mental Status: She is alert and oriented to person, place, and time.      Motor: Weakness present.  Psychiatric:        Attention and Perception: Attention normal.        Behavior: Behavior is cooperative.        Cognition and Memory: Cognition and memory normal.            Vital Signs: BP (!) 153/67 (BP Location: Right Arm)   Pulse 84   Temp 97.6 F (36.4 C) (Oral)   Resp 20   Ht '5\' 6"'$  (1.676 m)   Wt 91.6  kg   SpO2 92%   BMI 32.59 kg/m  SpO2: SpO2: 92 % O2 Device: O2 Device: Nasal Cannula O2 Flow Rate: O2 Flow Rate (L/min): 2 L/min      Palliative Assessment/Data: PPS 30%    Palliative Care Assessment & Plan   Patient Profile: Patient is a 88 y.o.  female with a history of COPD, chronic HFpEF, chronic hypoxic respiratory failure on 2 L of oxygen at home, DM-2, CKD stage IIIb-who was hospitalized from 6/24-6/26 for hypoglycemia in the setting of AKI-presented to the hospital on 6/29 with SOB/worsening lower extremity edema-found to have aspiration PNA and acute on chronic HFpEF.  Palliative care has been asked to get involved to further address goals of care in the setting of multiple chronic comorbid conditions and likelihood of poor outcomes with cardiopulmonary resuscitation.  Assessment: Principal Problem:   PNA (pneumonia) Active Problems:   Type 2 diabetes mellitus with diabetic neuropathy, unspecified (HCC)   Hyperlipidemia   Essential hypertension   COPD    CKD stage IIIb (HCC)   Obesity (BMI 30-39.9)   Recommendations/Plan: Continue current care, patient and family are hopeful she will continue to respond to high-dose diuretics Ongoing goals of care discussions pending clinical course  PMT will continue to follow and support holistically  Symptom Management Melatonin nightly for insomnia Imodium PRN loose stool Tessalon Perle, flutter valve, Mucinex for cough Gerhardt cream and scheduled Tylenol for buttock pain Hydroxyzine PRN anxiety  Prognosis:  Unable to determine  Discharge Planning: To Be Determined  Care plan  was discussed with patient, patient's HCPOA/daughter Rebecca Elliott  Total time: I spent 38 minutes in the care of the patient today in the above activities and documenting the encounter.   Dorthy Cooler, PA-C Palliative Medicine Team Team phone # [phone]  Thank you for allowing the Palliative Medicine Team to assist in the care of this patient. Please utilize secure chat with additional questions, if there is no response within 30 minutes please call the above phone number.  Palliative Medicine Team providers are available by phone from 7am to 7pm daily and can be reached through the team cell phone.  Should this patient require assistance outside of these hours, please call the patient's attending physician.

## 2021-07-22 NOTE — Progress Notes (Signed)
PROGRESS NOTE        PATIENT DETAILS Name: Rebecca Elliott Age: 88 y.o. Sex: female Date of Birth: [birth year] Admit Date: 07/16/2021 Admitting Physician Evalee Mutton Kristeen Mans, MD PCP:Paz, Alda Berthold, MD  Brief Summary: Patient is a 88 y.o.  female with a history of COPD, chronic HFpEF, chronic hypoxic respiratory failure on 2 L of oxygen at home, DM-2, CKD stage IIIb-who was hospitalized from 6/24-6/26 for hypoglycemia in the setting of AKI-presented to the hospital on 6/29 with SOB/worsening lower extremity edema-found to have aspiration PNA and acute on chronic HFpEF.   Significant events: 6/29>> admit to [facility]-worsening hypoxemia due to aspiration PNA and acute on chronic HFpEF.  Significant studies: 6/29>> CXR: RLL PNA-pulmonary venous congestion 6/30>> Echo: EF 16-10%, grade 2 diastolic dysfunction, positive wall motion abnormalities.  Significant microbiology data: 6/29>> COVID PCR: Negative 6/29>> blood culture: Negative  Procedures:   Consults: None  Subjective:  Patient in bed, appears comfortable, denies any headache, no fever, no chest pain or pressure, improved shortness of breath , no abdominal pain. No new focal weakness.    Objective: Vitals: Blood pressure (!) 153/67, pulse 84, temperature 97.6 F (36.4 C), temperature source Oral, resp. rate 20, height '5\' 6"'$  (1.676 m), weight 91.6 kg, SpO2 92 %.   Exam:  Awake Alert, No new F.N deficits, Normal affect [place].AT,PERRAL Supple Neck, No JVD,   Symmetrical Chest wall movement, Good air movement bilaterally, ++ rales RRR,No Gallops, Rubs or new Murmurs,  +ve B.Sounds, Abd Soft, No tenderness,   No Cyanosis, trace edema   Assessment/Plan:  Acute on chronic hypoxic respiratory failure due 2 acute on chronic diastolic CHF EF preserved at 60% with possible aspiration pneumonia: Overall still short of breath and evidence of fluid overload, unfortunately she is not responding well to high dose  diuretics, will try 120 mg of IV Lasix with metolazone and Aldactone on 07/21/2021 repeat on 07/22/21, he seems to have responded to high-dose diuretics on 07/21/2021 hence it will be repeated again, in case her hypoxia and shortness of breath get worse despite full treatment we will transition to full comfort care, continue empiric antibiotics but titrate Unasyn to Augmentin, encouraged to sit up in chair use I-S and flutter valve for pulmonary toiletry, supplemental oxygen advance activity and monitor.  This is more consistent with CHF than pneumonia.  Minimally elevated troponins: Trend is flat-not consistent with ACS.  Suspect this is due to demand ischemia.  Echo with stable EF but has some wall motion abnormalities-given advanced age-CKD stage IIIb-reasonable to manage this medically.   Hypokalemia and hypomagnesemia: Repleted and recheck.  CKD stage IIIb: Slight bump in creatinine but still not far from baseline-switching to oral diuretics today.  HTN: BP stable-metoprolol/hydralazine-losartan on hold to allow room for diuresis.    HLD: Continue statin  GERD: Continue PPI  COPD with chronic hypoxic respiratory failure on 2 L of oxygen (as needed):-Normally uses oxygen as needed-but since her discharge from her most recent hospitalization-she has been on 2 L of oxygen 24/7.  She has no evidence of exacerbation at this point-continue as needed bronchodilators.  OA bilateral knees: Supportive care   Chronic debility/deconditioning: Normally walks around the house with the help of a walker/cane-obtaining PT/OT eval.  Palliative care: DNR for now-family aware of poor overall long-term prognosis given development of aspiration pneumonia.  Plan is to  diuresis much as possible-continue IV antibiotics and see how much she will improve.  Palliative care following.  DM-2 (A1c 6.0 on 3/14): CBG with borderline hypoglycemia this morning-stop SSI-watch closely.  Not a candidate for aggressive glycemic  control.    Recent Labs    07/20/21 1836 07/20/21 2141 07/22/21 0826  GLUCAP 180* 143* 244*    Obesity: Estimated body mass index is 32.59 kg/m as calculated from the following:   Height as of this encounter: '5\' 6"'$  (1.676 m).   Weight as of this encounter: 91.6 kg.   Code status:   Code Status: DNR   DVT Prophylaxis: Place TED hose Start: 07/21/21 1500 heparin injection 5,000 Units Start: 07/16/21 1630   Family Communication: Daughter at bedside 07/19/21, 07/20/2021, 07/21/2021   Disposition Plan: Status is: Inpatient Remains inpatient appropriate because: Hypoxia due to aspiration PNA/CHF exacerbation-on IV antibiotics   Planned Discharge Destination:Home health   Diet: Diet Order             Diet regular Room service appropriate? Yes; Fluid consistency: Thin  Diet effective now                    MEDICATIONS: Scheduled Meds:  acetaminophen  1,000 mg Oral TID   amLODipine  10 mg Oral Daily   amoxicillin-clavulanate  1 tablet Oral Q12H   arformoterol  15 mcg Nebulization BID   atorvastatin  10 mg Oral Daily   benzonatate  100 mg Oral TID   budesonide (PULMICORT) nebulizer solution  0.25 mg Nebulization BID   Gerhardt's butt cream  1 Application Topical BID   guaiFENesin  600 mg Oral BID   heparin  5,000 Units Subcutaneous Q8H   hydrALAZINE  100 mg Oral TID   isosorbide mononitrate  30 mg Oral Daily   loratadine  10 mg Oral Daily   melatonin  10 mg Oral QHS   metolazone  10 mg Oral Once   metoprolol succinate  100 mg Oral Daily   multivitamin with minerals  1 tablet Oral Daily   pantoprazole  40 mg Oral Daily   potassium chloride  40 mEq Oral BID   revefenacin  175 mcg Nebulization Daily   spironolactone  50 mg Oral Once   Continuous Infusions:  furosemide      PRN Meds:.diclofenac Sodium, hydrALAZINE, hydrOXYzine, ipratropium-albuterol, loperamide, Muscle Rub, ondansetron **OR** ondansetron (ZOFRAN) IV, polyethylene glycol   I have personally  reviewed following labs and imaging studies  LABORATORY DATA:  Recent Labs  Lab 07/16/21 1346 07/16/21 1750 07/17/21 1443 07/19/21 0221 07/20/21 0010 07/21/21 0307 07/22/21 0119  WBC 21.6*   < > 19.8* 9.7 9.8 9.7 9.9  HGB 9.9*   < > 9.2* 8.9* 8.4* 8.8* 9.1*  HCT 31.5*   < > 29.2* 28.8* 26.9* 28.7* 29.2*  PLT 320   < > 284 312 324 332 346  MCV 75.0*   < > 75.1* 75.4* 74.7* 76.1* 73.7*  MCH 23.6*   < > 23.7* 23.3* 23.3* 23.3* 23.0*  MCHC 31.4   < > 31.5 30.9 31.2 30.7 31.2  RDW 15.8*   < > 15.9* 15.9* 15.6* 15.9* 15.6*  LYMPHSABS 1.9  --  2.2  --  1.8 1.7 2.5  MONOABS 1.8*  --  1.2*  --  0.8 0.8 0.8  EOSABS 0.0  --  0.0  --  0.0 0.2 0.2  BASOSABS 0.1  --  0.0  --  0.0 0.1 0.1   < > = values  in this interval not displayed.    Recent Labs  Lab 07/16/21 1346 07/16/21 1750 07/18/21 0158 07/19/21 0221 07/20/21 0010 07/21/21 0307 07/22/21 0119  NA 133*   < > 133* 133* 134* 135 133*  K 4.5   < > 3.1* 3.1* 3.7 3.7 3.1*  CL 102   < > 100 104 103 100 99  CO2 23   < > '24 23 24 25 26  '$ GLUCOSE 114*   < > 100* 124* 121* 118* 106*  BUN 19   < > 24* 22 22 24* 25*  CREATININE 1.66*   < > 1.90* 1.79* 1.69* 1.80* 1.73*  CALCIUM 8.5*   < > 8.0* 8.2* 8.4* 8.8* 8.5*  AST 85*  --   --   --   --   --   --   ALT 14  --   --   --   --   --   --   ALKPHOS 50  --   --   --   --   --   --   BILITOT 0.4  --   --   --   --   --   --   ALBUMIN 2.4*  --   --   --   --   --   --   MG  --   --   --  1.7 1.8 1.5* 2.5*  PROCALCITON  --   --   --   --  >150.00 >150.00 >150.00  BNP 1,829.9*  --   --  1,026.0* 1,572.9* 1,805.0* 1,851.9*   < > = values in this interval not displayed.    RADIOLOGY STUDIES/RESULTS: No results found.   LOS: 6 days   Signature  Lala Lund M.D on 07/22/2021 at 8:59 AM   -  To page go to [email or web address]

## 2021-07-22 NOTE — Plan of Care (Signed)
  Problem: Activity: Goal: Capacity to carry out activities will improve Outcome: Progressing   Problem: Cardiac: Goal: Ability to achieve and maintain adequate cardiopulmonary perfusion will improve Outcome: Progressing   Problem: Education: Goal: Knowledge of disease or condition will improve Outcome: Progressing   Problem: Activity: Goal: Ability to tolerate increased activity will improve Outcome: Progressing   Problem: Respiratory: Goal: Ability to maintain a clear airway will improve Outcome: Progressing

## 2021-07-22 NOTE — Progress Notes (Signed)
Physical Therapy Treatment Patient Details Name: Rebecca Elliott MRN: [id number] DOB: [birth year] Today's Date: 07/22/2021   History of Present Illness Patient is a 88 yo woman that presents to the hospital for c/o of LE sweliing and SOB.  Patient Dx with acute on chronic HFpEF exacerbation and R LL PNA.  PMHx includes COPD, chronic HF, chronic hypoxic resp failure 2 liters at home, DM, CKD stage 3, HTN, HLD, COPD, depression and recent hospital stay 6/24-6/26 for hypoglycemia.    PT Comments    Pt received in chair, agreeable to therapy session with good participation and tolerance for gait and reciprocal transfer training. Pt needing up to minA for transfers/gait and with improved activity tolerance, pt reporting 4/10 modified RPE (fatigue) at end of session. Pt continues to benefit from PT services to progress toward functional mobility goals.    Recommendations for follow up therapy are one component of a multi-disciplinary discharge planning process, led by the attending physician.  Recommendations may be updated based on patient status, additional functional criteria and insurance authorization.  Follow Up Recommendations  Acute inpatient rehab (3hours/day)     Assistance Recommended at Discharge Frequent or constant Supervision/Assistance  Patient can return home with the following A lot of help with walking and/or transfers;A lot of help with bathing/dressing/bathroom;Assistance with cooking/housework;Direct supervision/assist for medications management;Assist for transportation;Help with stairs or ramp for entrance   Equipment Recommendations  None recommended by PT    Recommendations for Other Services Rehab consult     Precautions / Restrictions Precautions Precautions: Fall Restrictions Weight Bearing Restrictions: No     Mobility  Bed Mobility               General bed mobility comments: up in recliner    Transfers Overall transfer level: Needs  assistance Equipment used: Rolling walker (2 wheels) Transfers: Sit to/from Stand Sit to Stand: Min assist           General transfer comment: from recliner x6 reps total (with some rest breaks)    Ambulation/Gait Ambulation/Gait assistance: Min assist Gait Distance (Feet): 75 Feet (including 3 standing breaks) Assistive device: Rolling walker (2 wheels) Gait Pattern/deviations: Decreased stride length, Trunk flexed Gait velocity: grossly <0.3 m/s     General Gait Details: mod cues for upright posture, pursed-lip breathing; SpO2 mostly WFL on 2L, desat to 91% but improves with pursed-lip breathing and standing breaks      Balance Overall balance assessment: Needs assistance, History of Falls Sitting-balance support: Feet supported Sitting balance-Leahy Scale: Good     Standing balance support: Bilateral upper extremity supported, Single extremity supported Standing balance-Leahy Scale: Fair Standing balance comment: static standing with U UE support; BUE support for dynamic tasks                            Cognition Arousal/Alertness: Awake/alert Behavior During Therapy: WFL for tasks assessed/performed Overall Cognitive Status: Within Functional Limits for tasks assessed                                 General Comments: pleasant, motivated        Exercises Other Exercises Other Exercises: STS x 5 reps reciprocal from chair using arms    General Comments General comments (skin integrity, edema, etc.): BP 145/74 (91) seated; BP 144/48 (76) standing after ~5 mins; HR 70-80's bpm with exertion; SpO2 90-94% on 2L with  exertion, improves to 92% with standing breaks      Pertinent Vitals/Pain Pain Assessment Pain Assessment: No/denies pain     PT Goals (current goals can now be found in the care plan section) Acute Rehab PT Goals Patient Stated Goal: to get around the house and do my chores PT Goal Formulation: With family Time For Goal  Achievement: 07/31/21 Progress towards PT goals: Progressing toward goals    Frequency    Min 3X/week      PT Plan Current plan remains appropriate       AM-PAC PT "6 Clicks" Mobility   Outcome Measure  Help needed turning from your back to your side while in a flat bed without using bedrails?: A Lot Help needed moving from lying on your back to sitting on the side of a flat bed without using bedrails?: A Lot Help needed moving to and from a bed to a chair (including a wheelchair)?: A Little Help needed standing up from a chair using your arms (e.g., wheelchair or bedside chair)?: A Little Help needed to walk in hospital room?: A Lot (mod cues throughout) Help needed climbing 3-5 steps with a railing? : A Lot 6 Click Score: 14    End of Session Equipment Utilized During Treatment: Oxygen Activity Tolerance: Patient tolerated treatment well Patient left: in chair;with call bell/phone within reach;with nursing/sitter in room;with family/visitor present (NT in room will reapply purewick) Nurse Communication: Mobility status PT Visit Diagnosis: Unsteadiness on feet (R26.81);Muscle weakness (generalized) (M62.81);Difficulty in walking, not elsewhere classified (R26.2)     Time: 0947-0962 PT Time Calculation (min) (ACUTE ONLY): 18 min  Charges:  $Gait Training: 8-22 mins                     Kaitlin Ardito P., PTA Acute Rehabilitation Services Secure Chat Preferred 9a-5:30pm Office: [place] 07/22/2021, 4:22 PM

## 2021-07-22 NOTE — Care Management Important Message (Signed)
Important Message  Patient Details  Name: Rebecca Elliott MRN: [id number] Date of Birth: [birth year]   Medicare Important Message Given:  Yes     [name] 07/22/2021, 1:57 PM

## 2021-07-22 NOTE — Progress Notes (Signed)
Occupational Therapy Treatment Patient Details Name: Rebecca Elliott MRN: [id number] DOB: 1926/09/26 Today's Date: 07/22/2021   History of present illness Patient is a 88 yo woman that presents to the hospital for c/o of LE sweliing and SOB.  Patient Dx with acute on chronic HFpEF exacerbation and R LL PNA.  PMHx includes COPD, chronic HF, chronic hypoxic resp failure 2 liters at home, DM, CKD stage 3, HTN, HLD, COPD, depression and recent hospital stay 6/24-6/26 for hypoglycemia.   OT comments  Pt demonstrating improved activity tolerance and strength this session. She was able to complete multiple ADL tasks with 1 rest break while toileting. At times, she continues to get SOB on 3L, however her O2 sats did not drop below 90%. Continues to require min-mod A for functional mobility and min-max A for ADL's. Continuing to recommend AIR to maximize independence and safety. OT will follow acutely.    Recommendations for follow up therapy are one component of a multi-disciplinary discharge planning process, led by the attending physician.  Recommendations may be updated based on patient status, additional functional criteria and insurance authorization.    Follow Up Recommendations  Acute inpatient rehab (3hours/day)    Assistance Recommended at Discharge Frequent or constant Supervision/Assistance  Patient can return home with the following  A lot of help with walking and/or transfers;A lot of help with bathing/dressing/bathroom;Direct supervision/assist for medications management;Direct supervision/assist for financial management;Assistance with feeding;Assist for transportation   Equipment Recommendations  Wheelchair (measurements OT)    Recommendations for Other Services      Precautions / Restrictions Precautions Precautions: Fall Restrictions Weight Bearing Restrictions: No       Mobility Bed Mobility               General bed mobility comments: up in recliner     Transfers Overall transfer level: Needs assistance Equipment used: Rolling walker (2 wheels) Transfers: Sit to/from Stand Sit to Stand: Mod assist, Min assist           General transfer comment: Initally mod A from recliner, later pt only needing min A to stand from [facility]     Balance Overall balance assessment: Needs assistance, History of Falls Sitting-balance support: Feet supported Sitting balance-Leahy Scale: Good     Standing balance support: Bilateral upper extremity supported, Single extremity supported Standing balance-Leahy Scale: Fair Standing balance comment: Able to stand at sink with 1UE as support as she completed grooming tasks                           ADL either performed or assessed with clinical judgement   ADL Overall ADL's : Needs assistance/impaired     Grooming: Wash/dry hands;Wash/dry face;Min guard;Standing Grooming Details (indicate cue type and reason): completed at sink     Lower Body Bathing: Maximal assistance;Sitting/lateral leans;Sit to/from stand Lower Body Bathing Details (indicate cue type and reason): difficulty maintaining balance while completing pericare         Toilet Transfer: Minimal assistance;Moderate assistance;Ambulation Toilet Transfer Details (indicate cue type and reason): Initial stand required mod A to power up, afterwards from [facility] she only required minA Toileting- Clothing Manipulation and Hygiene: Moderate assistance;Sitting/lateral lean;Sit to/from stand Toileting - Clothing Manipulation Details (indicate cue type and reason): Mod A for pericare in standing     Functional mobility during ADLs: Min guard;Rolling walker (2 wheels) General ADL Comments: Pt with improved strength and balance this session, able to complete multiple ADL tasks  Extremity/Trunk Assessment              Vision       Perception     Praxis      Cognition Arousal/Alertness: Awake/alert Behavior During Therapy: WFL  for tasks assessed/performed Overall Cognitive Status: Within Functional Limits for tasks assessed                                          Exercises      Shoulder Instructions       General Comments VSS on 3L    Pertinent Vitals/ Pain       Pain Assessment Pain Assessment: No/denies pain  Home Living                                          Prior Functioning/Environment              Frequency  Min 2X/week        Progress Toward Goals  OT Goals(current goals can now be found in the care plan section)  Progress towards OT goals: Progressing toward goals  Acute Rehab OT Goals Patient Stated Goal: To get better and go home OT Goal Formulation: With patient/family Time For Goal Achievement: 07/31/21 Potential to Achieve Goals: Good ADL Goals Pt Will Perform Grooming: with min guard assist;standing Pt Will Perform Upper Body Bathing: with supervision;with set-up Pt Will Perform Lower Body Bathing: with min assist;with adaptive equipment Pt Will Perform Upper Body Dressing: with supervision Pt Will Transfer to Toilet: with min assist;bedside commode  Plan Discharge plan remains appropriate;Frequency remains appropriate    Co-evaluation                 AM-PAC OT "6 Clicks" Daily Activity     Outcome Measure   Help from another person eating meals?: A Little Help from another person taking care of personal grooming?: A Little Help from another person toileting, which includes using toliet, bedpan, or urinal?: A Lot Help from another person bathing (including washing, rinsing, drying)?: A Lot Help from another person to put on and taking off regular upper body clothing?: A Little Help from another person to put on and taking off regular lower body clothing?: A Lot 6 Click Score: 15    End of Session Equipment Utilized During Treatment: Gait belt;Rolling walker (2 wheels);Oxygen  OT Visit Diagnosis: Muscle weakness  (generalized) (M62.81)   Activity Tolerance Patient tolerated treatment well   Patient Left in chair;with call bell/phone within reach;with family/visitor present   Nurse Communication Mobility status        Time: 9937-1696 OT Time Calculation (min): 27 min  Charges: OT General Charges $OT Visit: 1 Visit OT Treatments $Self Care/Home Management : 23-37 mins  Eraina Winnie H., OTR/L Acute Rehabilitation  Lalania Haseman Elane Wilmoth Rasnic 07/22/2021, 2:05 PM

## 2021-07-22 NOTE — Progress Notes (Signed)
Inpatient Rehab Admissions Coordinator:   Continue to follow medical progress.  Note pt on high dose IV Lasix for fluid management.  Seems to have responded well yesterday per documentation, and plans for repeat dosing today.  Would need to demonstrate ability to transition to PO lasix for CIR.  Shann Medal, PT, DPT Admissions Coordinator [phone] 07/22/21  10:45 AM

## 2021-07-23 ENCOUNTER — Inpatient Hospital Stay (HOSPITAL_COMMUNITY): Payer: Medicare Other

## 2021-07-23 DIAGNOSIS — Z515 Encounter for palliative care: Secondary | ICD-10-CM | POA: Diagnosis not present

## 2021-07-23 LAB — BRAIN NATRIURETIC PEPTIDE: B Natriuretic Peptide: 1146.7 pg/mL — ABNORMAL HIGH (ref 0.0–100.0)

## 2021-07-23 LAB — MAGNESIUM: Magnesium: 2 mg/dL (ref 1.7–2.4)

## 2021-07-23 LAB — BASIC METABOLIC PANEL
Anion gap: 11 (ref 5–15)
BUN: 21 mg/dL (ref 8–23)
CO2: 23 mmol/L (ref 22–32)
Calcium: 8.7 mg/dL — ABNORMAL LOW (ref 8.9–10.3)
Chloride: 99 mmol/L (ref 98–111)
Creatinine, Ser: 1.71 mg/dL — ABNORMAL HIGH (ref 0.44–1.00)
GFR, Estimated: 28 mL/min — ABNORMAL LOW (ref >60)
Glucose, Bld: 117 mg/dL — ABNORMAL HIGH (ref 70–99)
Potassium: 3.6 mmol/L (ref 3.5–5.1)
Sodium: 133 mmol/L — ABNORMAL LOW (ref 135–145)

## 2021-07-23 LAB — CBC WITH DIFFERENTIAL/PLATELET
Abs Immature Granulocytes: 0.11 10*3/uL — ABNORMAL HIGH (ref 0.00–0.07)
Basophils Absolute: 0.1 10*3/uL (ref 0.0–0.1)
Basophils Relative: 1 %
Eosinophils Absolute: 0.1 10*3/uL (ref 0.0–0.5)
Eosinophils Relative: 1 %
HCT: 30.6 % — ABNORMAL LOW (ref 36.0–46.0)
Hemoglobin: 9.4 g/dL — ABNORMAL LOW (ref 12.0–15.0)
Immature Granulocytes: 1 %
Lymphocytes Relative: 25 %
Lymphs Abs: 2.8 10*3/uL (ref 0.7–4.0)
MCH: 22.8 pg — ABNORMAL LOW (ref 26.0–34.0)
MCHC: 30.7 g/dL (ref 30.0–36.0)
MCV: 74.3 fL — ABNORMAL LOW (ref 80.0–100.0)
Monocytes Absolute: 0.7 10*3/uL (ref 0.1–1.0)
Monocytes Relative: 7 %
Neutro Abs: 7.5 10*3/uL (ref 1.7–7.7)
Neutrophils Relative %: 65 %
Platelets: 339 10*3/uL (ref 150–400)
RBC: 4.12 MIL/uL (ref 3.87–5.11)
RDW: 15.9 % — ABNORMAL HIGH (ref 11.5–15.5)
WBC: 11.3 10*3/uL — ABNORMAL HIGH (ref 4.0–10.5)
nRBC: 0.2 % (ref 0.0–0.2)

## 2021-07-23 MED ORDER — FUROSEMIDE 10 MG/ML IJ SOLN
60.0000 mg | Freq: Once | INTRAMUSCULAR | Status: AC
  Administered 2021-07-23: 60 mg via INTRAVENOUS
  Filled 2021-07-23: qty 6

## 2021-07-23 MED ORDER — FUROSEMIDE 10 MG/ML IJ SOLN
10.0000 mg/h | INTRAVENOUS | Status: AC
  Administered 2021-07-23: 10 mg/h via INTRAVENOUS
  Filled 2021-07-23: qty 20

## 2021-07-23 MED ORDER — SPIRONOLACTONE 25 MG PO TABS
50.0000 mg | ORAL_TABLET | Freq: Once | ORAL | Status: AC
Start: 2021-07-23 — End: 2021-07-23
  Administered 2021-07-23: 50 mg via ORAL
  Filled 2021-07-23: qty 2

## 2021-07-23 MED ORDER — POTASSIUM CHLORIDE CRYS ER 20 MEQ PO TBCR
40.0000 meq | EXTENDED_RELEASE_TABLET | Freq: Two times a day (BID) | ORAL | Status: AC
  Administered 2021-07-23 (×2): 40 meq via ORAL
  Filled 2021-07-23 (×2): qty 2

## 2021-07-23 MED ORDER — METOLAZONE 5 MG PO TABS
5.0000 mg | ORAL_TABLET | Freq: Once | ORAL | Status: AC
  Administered 2021-07-23: 5 mg via ORAL
  Filled 2021-07-23: qty 1

## 2021-07-23 MED ORDER — FUROSEMIDE 10 MG/ML IJ SOLN
10.0000 mg/h | INTRAVENOUS | Status: DC
  Filled 2021-07-23: qty 20

## 2021-07-23 NOTE — Progress Notes (Signed)
PT Cancellation Note  Patient Details Name: Rebecca Elliott MRN: [id number] DOB: 12/27/1926   Cancelled Treatment:    Reason Eval/Treat Not Completed: (P) Other (comment) (pt daughter defer PT, pt/family discussing hospice/comfort care.) Will continue efforts next date per PT plan of care, please cancel PT consult if family instead chooses comfort care.   Gerrica Cygan M Tausha Milhoan 07/23/2021, 5:01 PM

## 2021-07-23 NOTE — Progress Notes (Signed)
Daily Progress Note   Patient Name: Rebecca Elliott       Date: 07/23/2021 DOB: 30-Sep-1926  Age: 88 y.o. MRN#: [id number] Attending Physician: Thurnell Lose, MD Primary Care Physician: Colon Branch, MD Admit Date: 07/16/2021  Reason for Consultation/Follow-up: Establishing goals of care  Subjective: Medical records reviewed. Patient assessed at the bedside.  She is sitting up in bedside chair. Her daughter Rebecca Elliott is present visiting.   Patient tells me she did not expect to hear that she did not respond to the additional day of diuretics, as she was feeling better and even able to walk around her room yesterday. She is caught off guard and has not thought much about hospice or comfort care. We reviewed the difference between home hospice and residential hospice. She feels like she would rather go to a facility if eligible, as she does not want to make more difficult memories in the home she plans to leave for her daughters.   Emotional support and therapeutic listening was provided as patient reflected on her life, with both sadness and much to be thankful for. She will continue fighting today, but is also at peace with her mortality if hospice is recommended tomorrow. She states that she does not want to prolong her dying and she feels she will never get back to how she was. She hopes to get her finances arranged in anticipation of possible transition to hospice.  All questions and concerns addressed. Encouraged to call with additional questions and/or concerns. PMT will continue to support holistically.   Length of Stay: 7  Physical Exam Vitals and nursing note reviewed.  Constitutional:      General: She is not in acute distress.    Appearance: She is cachectic. She is ill-appearing.   Pulmonary:     Effort: No respiratory distress.  Skin:    General: Skin is warm and dry.  Neurological:     Mental Status: She is alert and oriented to person, place, and time.     Motor: Weakness present.  Psychiatric:        Attention and Perception: Attention normal.        Behavior: Behavior is cooperative.        Cognition and Memory: Cognition and memory normal.  Vital Signs: BP (!) 164/62 (BP Location: Right Arm)   Pulse 90   Temp 98.4 F (36.9 C) (Oral)   Resp 20   Ht '5\' 6"'$  (1.676 m)   Wt 91.6 kg   SpO2 93%   BMI 32.59 kg/m  SpO2: SpO2: 93 % O2 Device: O2 Device: Nasal Cannula O2 Flow Rate: O2 Flow Rate (L/min): 2 L/min      Palliative Assessment/Data: PPS 30%    Palliative Care Assessment & Plan   Patient Profile: Patient is a 88 y.o.  female with a history of COPD, chronic HFpEF, chronic hypoxic respiratory failure on 2 L of oxygen at home, DM-2, CKD stage IIIb-who was hospitalized from 6/24-6/26 for hypoglycemia in the setting of AKI-presented to the hospital on 6/29 with SOB/worsening lower extremity edema-found to have aspiration PNA and acute on chronic HFpEF.  Palliative care has been asked to get involved to further address goals of care in the setting of multiple chronic comorbid conditions and likelihood of poor outcomes with cardiopulmonary resuscitation.  Assessment: Principal Problem:   PNA (pneumonia) Active Problems:   Type 2 diabetes mellitus with diabetic neuropathy, unspecified (HCC)   Hyperlipidemia   Essential hypertension   COPD    CKD stage IIIb (HCC)   Obesity (BMI 30-39.9)   Recommendations/Plan: Continue current care and reassess response to lasix tomorrow Ongoing goals of care discussions pending clinical course  PMT will continue to follow and support holistically  Symptom Management Melatonin nightly for insomnia Imodium PRN loose stool Tessalon Perle, flutter valve, Mucinex for cough Gerhardt cream and scheduled  Tylenol for buttock pain Hydroxyzine PRN anxiety  Prognosis:  Unable to determine  Discharge Planning: To Be Determined  Care plan was discussed with patient, patient's HCPOA/daughter Rebecca Elliott  MDM: High  Aviv Rota Gregary Signs Palliative Medicine Team Team phone # [phone]  Thank you for allowing the Palliative Medicine Team to assist in the care of this patient. Please utilize secure chat with additional questions, if there is no response within 30 minutes please call the above phone number.  Palliative Medicine Team providers are available by phone from 7am to 7pm daily and can be reached through the team cell phone.  Should this patient require assistance outside of these hours, please call the patient's attending physician.

## 2021-07-23 NOTE — Progress Notes (Signed)
PROGRESS NOTE        PATIENT DETAILS Name: Rebecca Elliott Age: 88 y.o. Sex: female Date of Birth: [birth year] Admit Date: 07/16/2021 Admitting Physician Evalee Mutton Kristeen Mans, MD PCP:Paz, Alda Berthold, MD  Brief Summary: Patient is a 88 y.o.  female with a history of COPD, chronic HFpEF, chronic hypoxic respiratory failure on 2 L of oxygen at home, DM-2, CKD stage IIIb-who was hospitalized from 6/24-6/26 for hypoglycemia in the setting of AKI-presented to the hospital on 6/29 with SOB/worsening lower extremity edema-found to have aspiration PNA and acute on chronic HFpEF.   Significant events: 6/29>> admit to [facility]-worsening hypoxemia due to aspiration PNA and acute on chronic HFpEF.  Significant studies: 6/29>> CXR: RLL PNA-pulmonary venous congestion 6/30>> Echo: EF 44-31%, grade 2 diastolic dysfunction, positive wall motion abnormalities.  Significant microbiology data: 6/29>> COVID PCR: Negative 6/29>> blood culture: Negative  Procedures:   Consults: None  Subjective:  Patient in recliner, more short of breath today, did not make much urine yesterday, no chest or abdominal pain no focal weakness.   Objective: Vitals: Blood pressure (!) 164/62, pulse 90, temperature 98.4 F (36.9 C), temperature source Oral, resp. rate 20, height '5\' 6"'$  (1.676 m), weight 91.6 kg, SpO2 93 %.   Exam:  Awake Alert, No new F.N deficits, Normal affect Roland.AT,PERRAL Supple Neck, No JVD,   Symmetrical Chest wall movement, Good air movement bilaterally, ++ rales RRR,No Gallops, Rubs or new Murmurs,  +ve B.Sounds, Abd Soft, No tenderness,   No Cyanosis, Clubbing or edema    Assessment/Plan:  Acute on chronic hypoxic respiratory failure due 2 acute on chronic diastolic CHF EF preserved at 60% with possible aspiration pneumonia: Overall still short of breath and evidence of fluid overload, pneumonia has clinically resolved, main issue is CHF with fluid overload, unfortunately  she is not responding well to high dose diuretics, case discussed with CHF team Dr. Aundra Dubin on 07/23/2021.  Will switch to IV Lasix drip for the next 24 hours along with Aldactone and Zaroxolyn, if no meaningful diuresis then transition to full comfort measures on 07/24/2021.  Plan discussed with patient's daughter as well.  Palliative care is following.  Minimally elevated troponins: Trend is flat-not consistent with ACS.  Suspect this is due to demand ischemia.  Echo with stable EF but has some wall motion abnormalities-given advanced age-CKD stage IIIb-reasonable to manage this medically.   Hypokalemia and hypomagnesemia: Repleted and recheck.  CKD stage IIIb: Slight bump in creatinine but still not far from baseline-switching to oral diuretics today.  HTN: BP stable-metoprolol/hydralazine-losartan on hold to allow room for diuresis.    HLD: Continue statin  GERD: Continue PPI  COPD with chronic hypoxic respiratory failure on 2 L of oxygen (as needed):-Normally uses oxygen as needed-but since her discharge from her most recent hospitalization-she has been on 2 L of oxygen 24/7.  She has no evidence of exacerbation at this point-continue as needed bronchodilators.  OA bilateral knees: Supportive care   Chronic debility/deconditioning: Normally walks around the house with the help of a walker/cane-obtaining PT/OT eval.  Palliative care: DNR for now-family aware of poor overall long-term prognosis given development of aspiration pneumonia.  Plan is to diuresis much as possible-continue IV antibiotics and see how much she will improve.  Palliative care following.  DM-2 (A1c 6.0 on 3/14): CBG with borderline hypoglycemia this morning-stop SSI-watch closely.  Not a candidate for aggressive glycemic control.    Recent Labs    07/20/21 2141 07/22/21 0826 07/22/21 1608  GLUCAP 143* 244* 177*    Obesity: Estimated body mass index is 32.59 kg/m as calculated from the following:   Height as of  this encounter: '5\' 6"'$  (1.676 m).   Weight as of this encounter: 91.6 kg.   Code status:   Code Status: DNR   DVT Prophylaxis:  Place TED hose Start: 07/21/21 1500 heparin injection 5,000 Units Start: 07/16/21 1630   Family Communication: Daughter at bedside 07/19/21, 07/20/2021, 07/21/2021   Disposition Plan: Status is: Inpatient Remains inpatient appropriate because: Hypoxia due to aspiration PNA/CHF exacerbation-on IV antibiotics   Planned Discharge Destination:Home health   Diet: Diet Order             Diet regular Room service appropriate? Yes; Fluid consistency: Thin  Diet effective now                    MEDICATIONS: Scheduled Meds:  acetaminophen  1,000 mg Oral TID   amLODipine  10 mg Oral Daily   amoxicillin-clavulanate  1 tablet Oral Q12H   arformoterol  15 mcg Nebulization BID   atorvastatin  10 mg Oral Daily   benzonatate  100 mg Oral TID   budesonide (PULMICORT) nebulizer solution  0.25 mg Nebulization BID   furosemide  60 mg Intravenous Once   Gerhardt's butt cream  1 Application Topical BID   guaiFENesin  600 mg Oral BID   heparin  5,000 Units Subcutaneous Q8H   hydrALAZINE  100 mg Oral TID   isosorbide mononitrate  30 mg Oral Daily   loratadine  10 mg Oral Daily   melatonin  10 mg Oral QHS   metolazone  5 mg Oral Once   metoprolol succinate  100 mg Oral Daily   multivitamin with minerals  1 tablet Oral Daily   pantoprazole  40 mg Oral Daily   potassium chloride  40 mEq Oral BID   revefenacin  175 mcg Nebulization Daily   spironolactone  50 mg Oral Once   Continuous Infusions:  furosemide (LASIX) 200 mg in dextrose 5 % 100 mL (2 mg/mL) infusion      PRN Meds:.diclofenac Sodium, hydrALAZINE, hydrOXYzine, ipratropium-albuterol, loperamide, Muscle Rub, ondansetron **OR** ondansetron (ZOFRAN) IV, polyethylene glycol   I have personally reviewed following labs and imaging studies  LABORATORY DATA:  Recent Labs  Lab 07/17/21 1443  07/19/21 0221 07/20/21 0010 07/21/21 0307 07/22/21 0119 07/23/21 0152  WBC 19.8* 9.7 9.8 9.7 9.9 11.3*  HGB 9.2* 8.9* 8.4* 8.8* 9.1* 9.4*  HCT 29.2* 28.8* 26.9* 28.7* 29.2* 30.6*  PLT 284 312 324 332 346 339  MCV 75.1* 75.4* 74.7* 76.1* 73.7* 74.3*  MCH 23.7* 23.3* 23.3* 23.3* 23.0* 22.8*  MCHC 31.5 30.9 31.2 30.7 31.2 30.7  RDW 15.9* 15.9* 15.6* 15.9* 15.6* 15.9*  LYMPHSABS 2.2  --  1.8 1.7 2.5 2.8  MONOABS 1.2*  --  0.8 0.8 0.8 0.7  EOSABS 0.0  --  0.0 0.2 0.2 0.1  BASOSABS 0.0  --  0.0 0.1 0.1 0.1    Recent Labs  Lab 07/16/21 1346 07/16/21 1750 07/19/21 0221 07/20/21 0010 07/21/21 0307 07/22/21 0119 07/23/21 0152  NA 133*   < > 133* 134* 135 133* 133*  K 4.5   < > 3.1* 3.7 3.7 3.1* 3.6  CL 102   < > 104 103 100 99 99  CO2 23   < >  $'23 24 25 26 23  'U$ GLUCOSE 114*   < > 124* 121* 118* 106* 117*  BUN 19   < > 22 22 24* 25* 21  CREATININE 1.66*   < > 1.79* 1.69* 1.80* 1.73* 1.71*  CALCIUM 8.5*   < > 8.2* 8.4* 8.8* 8.5* 8.7*  AST 85*  --   --   --   --   --   --   ALT 14  --   --   --   --   --   --   ALKPHOS 50  --   --   --   --   --   --   BILITOT 0.4  --   --   --   --   --   --   ALBUMIN 2.4*  --   --   --   --   --   --   MG  --   --  1.7 1.8 1.5* 2.5* 2.0  PROCALCITON  --   --   --  >150.00 >150.00 >150.00  --   BNP 1,829.9*  --  1,026.0* 1,572.9* 1,805.0* 1,851.9* 1,146.7*   < > = values in this interval not displayed.    RADIOLOGY STUDIES/RESULTS: DG Chest Port 1 View  Result Date: 07/23/2021 CLINICAL DATA:  Shortness of breath. EXAM: PORTABLE CHEST 1 VIEW COMPARISON:  Two scratch set 07/19/2021 FINDINGS: The lungs are hypoinflated. Aortic atherosclerosis. Stable cardiomediastinal contours. Bilateral pleural effusions with interstitial edema is again noted in appears unchanged from previous exam. Persistent opacities within both lung bases identified which may reflect areas of asymmetric edema, airspace consolidation or atelectasis. IMPRESSION: 1. Persistent  bilateral pleural effusions and interstitial edema. 2. Persistent opacities within both lung bases which may reflect areas of asymmetric edema, airspace consolidation or atelectasis. Electronically Signed   By: Kerby Moors M.D.   On: 07/23/2021 06:56     LOS: 7 days   Signature  Lala Lund M.D on 07/23/2021 at 9:02 AM   -  To page go to [email or web address]

## 2021-07-24 DIAGNOSIS — Z7189 Other specified counseling: Secondary | ICD-10-CM | POA: Diagnosis not present

## 2021-07-24 DIAGNOSIS — Z515 Encounter for palliative care: Secondary | ICD-10-CM | POA: Diagnosis not present

## 2021-07-24 LAB — CBC WITH DIFFERENTIAL/PLATELET
Abs Immature Granulocytes: 0.09 10*3/uL — ABNORMAL HIGH (ref 0.00–0.07)
Basophils Absolute: 0.1 10*3/uL (ref 0.0–0.1)
Basophils Relative: 1 %
Eosinophils Absolute: 0.1 10*3/uL (ref 0.0–0.5)
Eosinophils Relative: 1 %
HCT: 27.5 % — ABNORMAL LOW (ref 36.0–46.0)
Hemoglobin: 8.5 g/dL — ABNORMAL LOW (ref 12.0–15.0)
Immature Granulocytes: 1 %
Lymphocytes Relative: 27 %
Lymphs Abs: 2.7 10*3/uL (ref 0.7–4.0)
MCH: 22.5 pg — ABNORMAL LOW (ref 26.0–34.0)
MCHC: 30.9 g/dL (ref 30.0–36.0)
MCV: 72.8 fL — ABNORMAL LOW (ref 80.0–100.0)
Monocytes Absolute: 0.8 10*3/uL (ref 0.1–1.0)
Monocytes Relative: 8 %
Neutro Abs: 6.4 10*3/uL (ref 1.7–7.7)
Neutrophils Relative %: 62 %
Platelets: 371 10*3/uL (ref 150–400)
RBC: 3.78 MIL/uL — ABNORMAL LOW (ref 3.87–5.11)
RDW: 15.9 % — ABNORMAL HIGH (ref 11.5–15.5)
WBC: 10 10*3/uL (ref 4.0–10.5)
nRBC: 0 % (ref 0.0–0.2)

## 2021-07-24 LAB — BASIC METABOLIC PANEL
Anion gap: 12 (ref 5–15)
BUN: 21 mg/dL (ref 8–23)
CO2: 27 mmol/L (ref 22–32)
Calcium: 8.5 mg/dL — ABNORMAL LOW (ref 8.9–10.3)
Chloride: 94 mmol/L — ABNORMAL LOW (ref 98–111)
Creatinine, Ser: 1.73 mg/dL — ABNORMAL HIGH (ref 0.44–1.00)
GFR, Estimated: 27 mL/min — ABNORMAL LOW (ref >60)
Glucose, Bld: 163 mg/dL — ABNORMAL HIGH (ref 70–99)
Potassium: 3.7 mmol/L (ref 3.5–5.1)
Sodium: 133 mmol/L — ABNORMAL LOW (ref 135–145)

## 2021-07-24 LAB — MAGNESIUM: Magnesium: 1.8 mg/dL (ref 1.7–2.4)

## 2021-07-24 LAB — BRAIN NATRIURETIC PEPTIDE: B Natriuretic Peptide: 1636.5 pg/mL — ABNORMAL HIGH (ref 0.0–100.0)

## 2021-07-24 MED ORDER — FUROSEMIDE 10 MG/ML IJ SOLN
10.0000 mg/h | INTRAMUSCULAR | Status: DC
  Filled 2021-07-24: qty 20

## 2021-07-24 MED ORDER — FUROSEMIDE 10 MG/ML IJ SOLN
10.0000 mg/h | INTRAMUSCULAR | Status: DC
  Administered 2021-07-24: 10 mg/h via INTRAVENOUS
  Filled 2021-07-24 (×2): qty 20

## 2021-07-24 MED ORDER — POTASSIUM CHLORIDE CRYS ER 20 MEQ PO TBCR
40.0000 meq | EXTENDED_RELEASE_TABLET | Freq: Two times a day (BID) | ORAL | Status: AC
  Administered 2021-07-24 (×2): 40 meq via ORAL
  Filled 2021-07-24 (×2): qty 2

## 2021-07-24 MED ORDER — TRAZODONE HCL 50 MG PO TABS
50.0000 mg | ORAL_TABLET | Freq: Every day | ORAL | Status: DC
  Administered 2021-07-24: 50 mg via ORAL
  Filled 2021-07-24: qty 1

## 2021-07-24 MED ORDER — SPIRONOLACTONE 25 MG PO TABS
50.0000 mg | ORAL_TABLET | Freq: Every day | ORAL | Status: DC
  Administered 2021-07-24: 50 mg via ORAL
  Filled 2021-07-24: qty 2

## 2021-07-24 MED ORDER — METOLAZONE 5 MG PO TABS
10.0000 mg | ORAL_TABLET | Freq: Once | ORAL | Status: AC
Start: 2021-07-24 — End: 2021-07-24
  Administered 2021-07-24: 10 mg via ORAL
  Filled 2021-07-24: qty 2

## 2021-07-24 MED ORDER — FUROSEMIDE 10 MG/ML IJ SOLN
60.0000 mg | Freq: Once | INTRAMUSCULAR | Status: AC
  Administered 2021-07-24: 60 mg via INTRAVENOUS
  Filled 2021-07-24: qty 6

## 2021-07-24 MED ORDER — ISOSORBIDE MONONITRATE ER 60 MG PO TB24
60.0000 mg | ORAL_TABLET | Freq: Every day | ORAL | Status: DC
  Administered 2021-07-25 – 2021-07-27 (×3): 60 mg via ORAL
  Filled 2021-07-24 (×3): qty 1

## 2021-07-24 NOTE — Progress Notes (Signed)
Physical Therapy Treatment Patient Details Name: Rebecca Elliott MRN: [id number] DOB: 12/14/1926 Today's Date: 07/24/2021   History of Present Illness Patient is a 88 yo woman that presents to the hospital for c/o of LE sweliing and SOB.  Patient Dx with acute on chronic HFpEF exacerbation and R LL PNA.  PMHx includes COPD, chronic HF, chronic hypoxic resp failure 2 liters at home, DM, CKD stage 3, HTN, HLD, COPD, depression and recent hospital stay 6/24-6/26 for hypoglycemia.    PT Comments    Pt received standing up in room with daughter present assisting with peri-care, pt agreeable to therapy session and eager to progress standing/gait tolerance. Pt needing up to minA for transfers and gait progression short household distance in hallway with RW support. Pt reports 4/10 modified RPE at end of session and reports she is still interested in participating in therapies to get stronger, and likely to tolerate AIR well if she remains at medical stability level she has been past couple of PT sessions. Pt daughter present throughout and encouraging. Pt continues to benefit from PT services to progress toward functional mobility goals.   Recommendations for follow up therapy are one component of a multi-disciplinary discharge planning process, led by the attending physician.  Recommendations may be updated based on patient status, additional functional criteria and insurance authorization.  Follow Up Recommendations  Acute inpatient rehab (3hours/day)     Assistance Recommended at Discharge Frequent or constant Supervision/Assistance  Patient can return home with the following A lot of help with walking and/or transfers;A lot of help with bathing/dressing/bathroom;Assistance with cooking/housework;Direct supervision/assist for medications management;Assist for transportation;Help with stairs or ramp for entrance   Equipment Recommendations  None recommended by PT    Recommendations for Other Services  Rehab consult     Precautions / Restrictions Precautions Precautions: Fall Restrictions Weight Bearing Restrictions: No     Mobility  Bed Mobility               General bed mobility comments: up in recliner    Transfers Overall transfer level: Needs assistance Equipment used: Rolling walker (2 wheels) Transfers: Sit to/from Stand Sit to Stand: Min assist           General transfer comment: from recliner<>RW    Ambulation/Gait Ambulation/Gait assistance: Min assist Gait Distance (Feet): 70 Feet Assistive device: Rolling walker (2 wheels) Gait Pattern/deviations: Decreased stride length, Trunk flexed Gait velocity: grossly <0.3 m/s     General Gait Details: mod cues for upright posture, pursed-lip breathing; SpO2 mostly WFL on 2L HF [place], briefly desat to 91% but improves with pursed-lip breathing and standing breaks      Balance Overall balance assessment: Needs assistance, History of Falls Sitting-balance support: Feet supported Sitting balance-Leahy Scale: Good     Standing balance support: Bilateral upper extremity supported, Single extremity supported Standing balance-Leahy Scale: Fair Standing balance comment: static standing with U UE support; BUE support for dynamic tasks                Cognition Arousal/Alertness: Awake/alert Behavior During Therapy: WFL for tasks assessed/performed Overall Cognitive Status: Within Functional Limits for tasks assessed                                 General Comments: pleasant, motivated, speaking of her family and past times during mobility but cooperative and following instructions well.        Exercises  General Comments General comments (skin integrity, edema, etc.): Diaper briefs brought to room and pt assisted to don prior to mobility due to pt c/o previous incontinence, pt appreciative. Extra pair left in room if needed later. VSS on 2L O2 [place] with occasional breaks for PLB when  dropping to 91%      Pertinent Vitals/Pain Pain Assessment Pain Assessment: No/denies pain           PT Goals (current goals can now be found in the care plan section) Acute Rehab PT Goals Patient Stated Goal: to get around the house and do my chores PT Goal Formulation: With family Time For Goal Achievement: 07/31/21 Progress towards PT goals: Progressing toward goals    Frequency    Min 3X/week      PT Plan Current plan remains appropriate       AM-PAC PT "6 Clicks" Mobility   Outcome Measure  Help needed turning from your back to your side while in a flat bed without using bedrails?: A Lot Help needed moving from lying on your back to sitting on the side of a flat bed without using bedrails?: A Lot Help needed moving to and from a bed to a chair (including a wheelchair)?: A Little Help needed standing up from a chair using your arms (e.g., wheelchair or bedside chair)?: A Little Help needed to walk in hospital room?: A Lot (mod cues throughout) Help needed climbing 3-5 steps with a railing? : A Lot 6 Click Score: 14    End of Session Equipment Utilized During Treatment: Oxygen;Gait belt Activity Tolerance: Patient tolerated treatment well Patient left: in chair;with call bell/phone within reach;with family/visitor present (BLE reclined, pillow under BLE for edema mgmt) Nurse Communication: Mobility status PT Visit Diagnosis: Unsteadiness on feet (R26.81);Muscle weakness (generalized) (M62.81);Difficulty in walking, not elsewhere classified (R26.2)     Time: 1510-1550 PT Time Calculation (min) (ACUTE ONLY): 40 min  Charges:  $Gait Training: 23-37 mins $Therapeutic Activity: 8-22 mins                     Alexandr Oehler P., PTA Acute Rehabilitation Services Secure Chat Preferred 9a-5:30pm Office: [place] 07/24/2021, 5:05 PM

## 2021-07-24 NOTE — Progress Notes (Addendum)
Palliative Medicine Inpatient Follow Up Note  HPI: Patient is a 88 y.o.  female with a history of COPD, chronic HFpEF, chronic hypoxic respiratory failure on 2 L of oxygen at home, DM-2, CKD stage IIIb-who was hospitalized from 6/24-6/26 for hypoglycemia in the setting of AKI-presented to the hospital on 6/29 with SOB/worsening lower extremity edema-found to have aspiration PNA and acute on chronic HFpEF.  Palliative care has been asked to get involved to further address goals of care in the setting of multiple chronic comorbid conditions and likelihood of poor outcomes with cardiopulmonary resuscitation.  Today's Discussion 07/24/2021  *Please note that this is a verbal dictation therefore any spelling or grammatical errors are due to the "[place]" system interpretation.  Chart reviewed inclusive of vital signs, progress notes, laboratory results, and diagnostic images.   I met with Collen, Hostler daughter this afternoon.  Yvaine was noted to be sleeping in the recliner chair.  [name] I reviewed the events from the past few days inclusive of Verne's lack of response to Lasix.  [name] shares that yesterday to today there had been an increase in output which is in some ways reassuring.  We reviewed that seeing how Zi does over the next 24 hours will be very telling in terms of the direction of care.  Discussed that Alicea has had a decrease in her cough though remains to be sleeping very poorly in the setting of hospitalization.  We discussed stopping the melatonin and starting low-dose trazodone to see if that makes any impact.  [name] asked that this is given with Imodium so that Lillias does not have loose bowels while she is sleeping which had been a problem in the past.  Discussed if Akaila does not do well over the next 24 hours what hospice would look like.  Discussed home hospice versus residential hospice.  [name] shares that she would like to continue discussing with her mom where she  would desire to be if it were her time though in the past but he has been clear about wishing to be home.  [name] shares that there is a lot of suspected fear on Jeymi's side about overburden denying both she and her sister.  Questions and concerns addressed   Palliative Support Provided  Objective Assessment: Vital Signs Vitals:   07/24/21 1000 07/24/21 1124  BP:  (!) 150/59  Pulse: 86 82  Resp: (!) 22 20  Temp:  97.8 F (36.6 C)  SpO2: 97% 94%    Intake/Output Summary (Last 24 hours) at 07/24/2021 1311 Last data filed at 07/24/2021 1123 Gross per 24 hour  Intake 120 ml  Output 2150 ml  Net -2030 ml    Last Weight  Most recent update: 07/24/2021  1:14 AM    Weight  90.5 kg (199 lb 8.3 oz)            Gen: Elderly Caucasian female in no acute distress  HEENT: Dry mucous membranes CV:  regular rate and rhythm PULM: On 4L nasal cannula, breathing is even and nonlabored ABD: soft/nontender EXT: Bilateral lower extremity edema Neuro: Somnolent  SUMMARY OF RECOMMENDATIONS   DNAR/DNI   We will see how Taccara does in the next 24 hours though she is not thriving the likely plan will be for her to transition to hospice care  Insomnia --> stop melatonin and start trazodone  Loose bowels Imodium PRN --> reviewed that patient can get more than 1 dose a day   Continue current plan  of care --> goals are for continued improvement  Cough --> Tessalon TID, RT added flutter valve, continue mucinex  Buttock Pain --> Gerhardt cream, tylenol tid   Anxiety --> Continue hydroxyzine  Ongoing palliative care support  MDM - High ______________________________________________________________________________________ [place] Team Team Cell Phone: [phone] Please utilize secure chat with additional questions, if there is no response within 30 minutes please call the above phone number  Palliative Medicine Team providers are available by phone  from 7am to 7pm daily and can be reached through the team cell phone.  Should this patient require assistance outside of these hours, please call the patient's attending physician.

## 2021-07-24 NOTE — Progress Notes (Signed)
PROGRESS NOTE        PATIENT DETAILS Name: Rebecca Elliott Age: 88 y.o. Sex: female Date of Birth: 1926/05/21 Admit Date: 07/16/2021 Admitting Physician Evalee Mutton Kristeen Mans, MD PCP:Paz, Alda Berthold, MD  Brief Summary: Patient is a 88 y.o.  female with a history of COPD, chronic HFpEF, chronic hypoxic respiratory failure on 2 L of oxygen at home, DM-2, CKD stage IIIb-who was hospitalized from 6/24-6/26 for hypoglycemia in the setting of AKI-presented to the hospital on 6/29 with SOB/worsening lower extremity edema-found to have aspiration PNA and acute on chronic HFpEF.   Significant events: 6/29>> admit to [facility]-worsening hypoxemia due to aspiration PNA and acute on chronic HFpEF.  Significant studies: 6/29>> CXR: RLL PNA-pulmonary venous congestion 6/30>> Echo: EF 67-67%, grade 2 diastolic dysfunction, positive wall motion abnormalities.  Significant microbiology data: 6/29>> COVID PCR: Negative 6/29>> blood culture: Negative  Procedures:   Consults: None  Subjective:  Patient in bed, appears comfortable, denies any headache, no fever, no chest pain or pressure, no shortness of breath , no abdominal pain. No new focal weakness.   Objective: Vitals: Blood pressure (!) 171/72, pulse 86, temperature 97.7 F (36.5 C), temperature source Oral, resp. rate (!) 22, height '5\' 6"'$  (1.676 m), weight 90.5 kg, SpO2 97 %.   Exam:  Awake Alert, No new F.N deficits, Normal affect [place].AT,PERRAL Supple Neck, No JVD,   Symmetrical Chest wall movement, Good air movement bilaterally, ++ rales RRR,No Gallops, Rubs or new Murmurs,  +ve B.Sounds, Abd Soft, No tenderness,   No Cyanosis, +ve edema     Assessment/Plan:  Acute on chronic hypoxic respiratory failure due 2 acute on chronic diastolic CHF EF preserved at 60% with possible aspiration pneumonia: Overall still short of breath and evidence of fluid overload, pneumonia has clinically resolved, main issue is CHF with  fluid overload, unfortunately she did not respond to high-dose IV Lasix, Zaroxolyn and Aldactone combination, case discussed with CHF team Dr. Aundra Dubin on 07/23/2021.  Was subsequently switched to IV Lasix push followed by IV Lasix drip along with Aldactone and Zaroxolyn, thankfully with IV Lasix drip diuresis has improved continue, family clear that if the at any point there is significant decline we will transition to hospice and full comfort care.  Plan discussed with patient's daughter as well.  Palliative care is following.  Minimally elevated troponins: Trend is flat-not consistent with ACS.  Suspect this is due to demand ischemia.  Echo with stable EF but has some wall motion abnormalities-given advanced age-CKD stage IIIb-reasonable to manage this medically.   Hypokalemia and hypomagnesemia: Repleted and recheck.  CKD stage IIIb: Slight bump in creatinine but still not far from baseline-switching to oral diuretics today.  HTN: BP stable-metoprolol/hydralazine-losartan on hold to allow room for diuresis.    HLD: Continue statin  GERD: Continue PPI  COPD with chronic hypoxic respiratory failure on 2 L of oxygen (as needed):-Normally uses oxygen as needed-but since her discharge from her most recent hospitalization-she has been on 2 L of oxygen 24/7.  She has no evidence of exacerbation at this point-continue as needed bronchodilators.  OA bilateral knees: Supportive care   Chronic debility/deconditioning: Normally walks around the house with the help of a walker/cane-obtaining PT/OT eval.  Palliative care: DNR for now-family aware of poor overall long-term prognosis given development of aspiration pneumonia.  Plan is to diuresis much as  possible-continue IV antibiotics and see how much she will improve.  Palliative care following.  DM-2 (A1c 6.0 on 3/14): CBG with borderline hypoglycemia this morning-stop SSI-watch closely.  Not a candidate for aggressive glycemic control.    Recent Labs     07/22/21 0826 07/22/21 1608  GLUCAP 244* 177*    Obesity: Estimated body mass index is 32.2 kg/m as calculated from the following:   Height as of this encounter: '5\' 6"'$  (1.676 m).   Weight as of this encounter: 90.5 kg.   Code status:   Code Status: DNR   DVT Prophylaxis:  Place TED hose Start: 07/21/21 1500 heparin injection 5,000 Units Start: 07/16/21 1630   Family Communication: Daughter at bedside 07/19/21, 07/20/2021, 07/21/2021, 07/22/2021, 07/23/2021, 07/24/2021   Disposition Plan: Status is: Inpatient Remains inpatient appropriate because: Hypoxia due to aspiration PNA/CHF exacerbation-on IV antibiotics   Planned Discharge Destination:Home health   Diet: Diet Order             Diet regular Room service appropriate? Yes; Fluid consistency: Thin  Diet effective now                    MEDICATIONS: Scheduled Meds:  acetaminophen  1,000 mg Oral TID   amLODipine  10 mg Oral Daily   arformoterol  15 mcg Nebulization BID   atorvastatin  10 mg Oral Daily   benzonatate  100 mg Oral TID   budesonide (PULMICORT) nebulizer solution  0.25 mg Nebulization BID   furosemide  60 mg Intravenous Once   Gerhardt's butt cream  1 Application Topical BID   guaiFENesin  600 mg Oral BID   heparin  5,000 Units Subcutaneous Q8H   hydrALAZINE  100 mg Oral TID   [START ON 07/25/2021] isosorbide mononitrate  60 mg Oral Daily   loratadine  10 mg Oral Daily   melatonin  10 mg Oral QHS   metolazone  10 mg Oral Once   metoprolol succinate  100 mg Oral Daily   multivitamin with minerals  1 tablet Oral Daily   pantoprazole  40 mg Oral Daily   potassium chloride  40 mEq Oral BID   revefenacin  175 mcg Nebulization Daily   spironolactone  50 mg Oral Daily   Continuous Infusions:  furosemide (LASIX) 200 mg in dextrose 5 % 100 mL (2 mg/mL) infusion      PRN Meds:.diclofenac Sodium, hydrALAZINE, hydrOXYzine, ipratropium-albuterol, loperamide, Muscle Rub, ondansetron **OR** ondansetron  (ZOFRAN) IV, polyethylene glycol   I have personally reviewed following labs and imaging studies  LABORATORY DATA:  Recent Labs  Lab 07/20/21 0010 07/21/21 0307 07/22/21 0119 07/23/21 0152 07/24/21 0050  WBC 9.8 9.7 9.9 11.3* 10.0  HGB 8.4* 8.8* 9.1* 9.4* 8.5*  HCT 26.9* 28.7* 29.2* 30.6* 27.5*  PLT 324 332 346 339 371  MCV 74.7* 76.1* 73.7* 74.3* 72.8*  MCH 23.3* 23.3* 23.0* 22.8* 22.5*  MCHC 31.2 30.7 31.2 30.7 30.9  RDW 15.6* 15.9* 15.6* 15.9* 15.9*  LYMPHSABS 1.8 1.7 2.5 2.8 2.7  MONOABS 0.8 0.8 0.8 0.7 0.8  EOSABS 0.0 0.2 0.2 0.1 0.1  BASOSABS 0.0 0.1 0.1 0.1 0.1    Recent Labs  Lab 07/20/21 0010 07/21/21 0307 07/22/21 0119 07/23/21 0152 07/24/21 0050  NA 134* 135 133* 133* 133*  K 3.7 3.7 3.1* 3.6 3.7  CL 103 100 99 99 94*  CO2 '24 25 26 23 27  '$ GLUCOSE 121* 118* 106* 117* 163*  BUN 22 24* 25* 21 21  CREATININE  1.69* 1.80* 1.73* 1.71* 1.73*  CALCIUM 8.4* 8.8* 8.5* 8.7* 8.5*  MG 1.8 1.5* 2.5* 2.0 1.8  PROCALCITON >150.00 >150.00 >150.00  --   --   BNP 1,572.9* 1,805.0* 1,851.9* 1,146.7* 1,636.5*    RADIOLOGY STUDIES/RESULTS: DG Chest Port 1 View  Result Date: 07/23/2021 CLINICAL DATA:  Shortness of breath. EXAM: PORTABLE CHEST 1 VIEW COMPARISON:  Two scratch set 07/19/2021 FINDINGS: The lungs are hypoinflated. Aortic atherosclerosis. Stable cardiomediastinal contours. Bilateral pleural effusions with interstitial edema is again noted in appears unchanged from previous exam. Persistent opacities within both lung bases identified which may reflect areas of asymmetric edema, airspace consolidation or atelectasis. IMPRESSION: 1. Persistent bilateral pleural effusions and interstitial edema. 2. Persistent opacities within both lung bases which may reflect areas of asymmetric edema, airspace consolidation or atelectasis. Electronically Signed   By: Kerby Moors M.D.   On: 07/23/2021 06:56     LOS: 8 days   Signature  Lala Lund M.D on 07/24/2021 at 11:18 AM    -  To page go to [email or web address]

## 2021-07-25 DIAGNOSIS — Z515 Encounter for palliative care: Secondary | ICD-10-CM | POA: Diagnosis not present

## 2021-07-25 DIAGNOSIS — J411 Mucopurulent chronic bronchitis: Secondary | ICD-10-CM | POA: Diagnosis not present

## 2021-07-25 DIAGNOSIS — I509 Heart failure, unspecified: Secondary | ICD-10-CM | POA: Diagnosis not present

## 2021-07-25 DIAGNOSIS — N1832 Chronic kidney disease, stage 3b: Secondary | ICD-10-CM | POA: Diagnosis not present

## 2021-07-25 DIAGNOSIS — J690 Pneumonitis due to inhalation of food and vomit: Secondary | ICD-10-CM

## 2021-07-25 LAB — BASIC METABOLIC PANEL
Anion gap: 10 (ref 5–15)
BUN: 24 mg/dL — ABNORMAL HIGH (ref 8–23)
CO2: 28 mmol/L (ref 22–32)
Calcium: 8.7 mg/dL — ABNORMAL LOW (ref 8.9–10.3)
Chloride: 94 mmol/L — ABNORMAL LOW (ref 98–111)
Creatinine, Ser: 1.78 mg/dL — ABNORMAL HIGH (ref 0.44–1.00)
GFR, Estimated: 26 mL/min — ABNORMAL LOW (ref >60)
Glucose, Bld: 143 mg/dL — ABNORMAL HIGH (ref 70–99)
Potassium: 3.9 mmol/L (ref 3.5–5.1)
Sodium: 132 mmol/L — ABNORMAL LOW (ref 135–145)

## 2021-07-25 LAB — MAGNESIUM: Magnesium: 1.8 mg/dL (ref 1.7–2.4)

## 2021-07-25 MED ORDER — BENZONATATE 100 MG PO CAPS
100.0000 mg | ORAL_CAPSULE | Freq: Three times a day (TID) | ORAL | Status: DC | PRN
Start: 2021-07-25 — End: 2021-07-27

## 2021-07-25 MED ORDER — METOLAZONE 5 MG PO TABS
10.0000 mg | ORAL_TABLET | Freq: Once | ORAL | Status: AC
  Administered 2021-07-25: 10 mg via ORAL
  Filled 2021-07-25: qty 2

## 2021-07-25 MED ORDER — SPIRONOLACTONE 25 MG PO TABS
100.0000 mg | ORAL_TABLET | Freq: Every day | ORAL | Status: DC
  Administered 2021-07-25: 100 mg via ORAL
  Filled 2021-07-25: qty 4

## 2021-07-25 MED ORDER — FUROSEMIDE 10 MG/ML IJ SOLN
10.0000 mg/h | INTRAVENOUS | Status: AC
  Administered 2021-07-25: 10 mg/h via INTRAVENOUS
  Filled 2021-07-25 (×3): qty 20

## 2021-07-25 MED ORDER — TRAZODONE HCL 50 MG PO TABS
100.0000 mg | ORAL_TABLET | Freq: Every day | ORAL | Status: DC
  Administered 2021-07-25 – 2021-07-26 (×2): 100 mg via ORAL
  Filled 2021-07-25 (×2): qty 2

## 2021-07-25 MED ORDER — POTASSIUM CHLORIDE CRYS ER 20 MEQ PO TBCR
40.0000 meq | EXTENDED_RELEASE_TABLET | Freq: Every day | ORAL | Status: AC
  Administered 2021-07-25 – 2021-07-26 (×2): 40 meq via ORAL
  Filled 2021-07-25 (×2): qty 2

## 2021-07-25 NOTE — Progress Notes (Signed)
PROGRESS NOTE        PATIENT DETAILS Name: Rebecca Elliott Age: 88 y.o. Sex: female Date of Birth: [birth year] Admit Date: 07/16/2021 Admitting Physician Evalee Mutton Kristeen Mans, MD PCP:Paz, Alda Berthold, MD  Brief Summary: Patient is a 88 y.o.  female with a history of COPD, chronic HFpEF, chronic hypoxic respiratory failure on 2 L of oxygen at home, DM-2, CKD stage IIIb-who was hospitalized from 6/24-6/26 for hypoglycemia in the setting of AKI-presented to the hospital on 6/29 with SOB/worsening lower extremity edema-found to have aspiration PNA and acute on chronic HFpEF.   Significant events: 6/29>> admit to [facility]-worsening hypoxemia due to aspiration PNA and acute on chronic HFpEF.  Significant studies: 6/29>> CXR: RLL PNA-pulmonary venous congestion 6/30>> Echo: EF 27-25%, grade 2 diastolic dysfunction, positive wall motion abnormalities.  Significant microbiology data: 6/29>> COVID PCR: Negative 6/29>> blood culture: Negative  Procedures:   Consults: None  Subjective:  Patient in bed, appears comfortable, denies any headache, no fever, no chest pain or pressure, improving shortness of breath , no abdominal pain. No new focal weakness.    Objective: Vitals: Blood pressure (!) 147/56, pulse 86, temperature 97.8 F (36.6 C), temperature source Oral, resp. rate 20, height '5\' 6"'$  (1.676 m), weight 86.7 kg, SpO2 96 %.   Exam:  Awake Alert, No new F.N deficits, Normal affect [place].AT,PERRAL Supple Neck, No JVD,   Symmetrical Chest wall movement, Good air movement bilaterally, ++ rales RRR,No Gallops, Rubs or new Murmurs,  +ve B.Sounds, Abd Soft, No tenderness,   No Cyanosis, 1+ leg edema - TEDs   Assessment/Plan:  Acute on chronic hypoxic respiratory failure due 2 acute on chronic diastolic CHF EF preserved at 60% with possible aspiration pneumonia: Overall still short of breath and evidence of fluid overload, pneumonia has clinically resolved, main issue is  CHF with fluid overload, unfortunately she did not respond to high-dose IV Lasix, Zaroxolyn and Aldactone combination, case discussed with CHF team Dr. Aundra Dubin on 07/23/2021.  Was subsequently IV Lasix drip along with Aldactone and Zaroxolyn - continue all, thankfully with IV Lasix drip diuresis has improved continue, family clear that if the at any point there is significant decline we will transition to hospice and full comfort care.  Plan discussed with patient's daughter as well.  Palliative care is following.  Minimally elevated troponins: Trend is flat-not consistent with ACS.  Suspect this is due to demand ischemia.  Echo with stable EF but has some wall motion abnormalities-given advanced age-CKD stage IIIb-reasonable to manage this medically.   Hypokalemia and hypomagnesemia: Repleted and recheck.  CKD stage IIIb: Slight bump in creatinine but still not far from baseline-switching to oral diuretics today.  HTN: BP stable-metoprolol/hydralazine-losartan on hold to allow room for diuresis.    HLD: Continue statin  GERD: Continue PPI  COPD with chronic hypoxic respiratory failure on 2 L of oxygen (as needed):-Normally uses oxygen as needed-but since her discharge from her most recent hospitalization-she has been on 2 L of oxygen 24/7.  She has no evidence of exacerbation at this point-continue as needed bronchodilators.  OA bilateral knees: Supportive care   Chronic debility/deconditioning: Normally walks around the house with the help of a walker/cane-obtaining PT/OT eval.  Palliative care: DNR for now-family aware of poor overall long-term prognosis given development of aspiration pneumonia.  Plan is to diuresis much as possible-continue IV antibiotics  and see how much she will improve.  Palliative care following.  DM-2 (A1c 6.0 on 3/14): CBG with borderline hypoglycemia this morning-stop SSI-watch closely.  Not a candidate for aggressive glycemic control.    Recent Labs     07/22/21 0826 07/22/21 1608  GLUCAP 244* 177*    Obesity: Estimated body mass index is 30.85 kg/m as calculated from the following:   Height as of this encounter: '5\' 6"'$  (1.676 m).   Weight as of this encounter: 86.7 kg.   Code status:   Code Status: DNR   DVT Prophylaxis:  Place TED hose Start: 07/21/21 1500 heparin injection 5,000 Units Start: 07/16/21 1630   Family Communication: Daughter at bedside 07/19/21, 07/20/2021, 07/21/2021, 07/22/2021, 07/23/2021, 07/24/2021, 07/25/2021   Disposition Plan: Status is: Inpatient Remains inpatient appropriate because: Hypoxia due to aspiration PNA/CHF exacerbation-on IV antibiotics   Planned Discharge Destination:Home health   Diet: Diet Order             Diet regular Room service appropriate? Yes; Fluid consistency: Thin  Diet effective now                    MEDICATIONS: Scheduled Meds:  acetaminophen  1,000 mg Oral TID   amLODipine  10 mg Oral Daily   arformoterol  15 mcg Nebulization BID   atorvastatin  10 mg Oral Daily   benzonatate  100 mg Oral TID   budesonide (PULMICORT) nebulizer solution  0.25 mg Nebulization BID   Gerhardt's butt cream  1 Application Topical BID   guaiFENesin  600 mg Oral BID   heparin  5,000 Units Subcutaneous Q8H   hydrALAZINE  100 mg Oral TID   isosorbide mononitrate  60 mg Oral Daily   loratadine  10 mg Oral Daily   metolazone  10 mg Oral Once   metoprolol succinate  100 mg Oral Daily   multivitamin with minerals  1 tablet Oral Daily   pantoprazole  40 mg Oral Daily   potassium chloride  40 mEq Oral Daily   revefenacin  175 mcg Nebulization Daily   spironolactone  100 mg Oral Daily   traZODone  50 mg Oral QHS   Continuous Infusions:  furosemide (LASIX) 200 mg in dextrose 5 % 100 mL (2 mg/mL) infusion      PRN Meds:.diclofenac Sodium, hydrALAZINE, hydrOXYzine, ipratropium-albuterol, loperamide, Muscle Rub, ondansetron **OR** ondansetron (ZOFRAN) IV, polyethylene glycol   I have  personally reviewed following labs and imaging studies  LABORATORY DATA:  Recent Labs  Lab 07/20/21 0010 07/21/21 0307 07/22/21 0119 07/23/21 0152 07/24/21 0050  WBC 9.8 9.7 9.9 11.3* 10.0  HGB 8.4* 8.8* 9.1* 9.4* 8.5*  HCT 26.9* 28.7* 29.2* 30.6* 27.5*  PLT 324 332 346 339 371  MCV 74.7* 76.1* 73.7* 74.3* 72.8*  MCH 23.3* 23.3* 23.0* 22.8* 22.5*  MCHC 31.2 30.7 31.2 30.7 30.9  RDW 15.6* 15.9* 15.6* 15.9* 15.9*  LYMPHSABS 1.8 1.7 2.5 2.8 2.7  MONOABS 0.8 0.8 0.8 0.7 0.8  EOSABS 0.0 0.2 0.2 0.1 0.1  BASOSABS 0.0 0.1 0.1 0.1 0.1    Recent Labs  Lab 07/20/21 0010 07/21/21 0307 07/22/21 0119 07/23/21 0152 07/24/21 0050 07/25/21 0124  NA 134* 135 133* 133* 133* 132*  K 3.7 3.7 3.1* 3.6 3.7 3.9  CL 103 100 99 99 94* 94*  CO2 '24 25 26 23 27 28  '$ GLUCOSE 121* 118* 106* 117* 163* 143*  BUN 22 24* 25* 21 21 24*  CREATININE 1.69* 1.80* 1.73* 1.71* 1.73*  1.78*  CALCIUM 8.4* 8.8* 8.5* 8.7* 8.5* 8.7*  MG 1.8 1.5* 2.5* 2.0 1.8 1.8  PROCALCITON >150.00 >150.00 >150.00  --   --   --   BNP 1,572.9* 1,805.0* 1,851.9* 1,146.7* 1,636.5*  --     RADIOLOGY STUDIES/RESULTS: No results found.   LOS: 9 days   Signature  Lala Lund M.D on 07/25/2021 at 8:03 AM   -  To page go to [email or web address]

## 2021-07-25 NOTE — Progress Notes (Signed)
Palliative Medicine Inpatient Follow Up Note  HPI: Patient is a 88 y.o.  female with a history of COPD, chronic HFpEF, chronic hypoxic respiratory failure on 2 L of oxygen at home, DM-2, CKD stage IIIb-who was hospitalized from 6/24-6/26 for hypoglycemia in the setting of AKI-presented to the hospital on 6/29 with SOB/worsening lower extremity edema-found to have aspiration PNA and acute on chronic HFpEF.  Palliative care has been asked to get involved to further address goals of care in the setting of multiple chronic comorbid conditions and likelihood of poor outcomes with cardiopulmonary resuscitation.  Today's Discussion 07/25/2021  *Please note that this is a verbal dictation therefore any spelling or grammatical errors are due to the "[place]" system interpretation.  Chart reviewed inclusive of vital signs, progress notes, laboratory results, and diagnostic images.   I met with Rebecca Elliott, Rebecca Elliott daughter this afternoon.    Again Rebecca Elliott was noted to be quite sleepy though she was arousable.  Her nurse had awoken her to give her a few medications.  Rebecca Elliott and I reviewed the plan at this point in time which continues to be that of watchful waiting to see if Rebecca Elliott can improve.  We discussed that Rebecca Elliott's breathing has improved tremendously since treatment of her pneumonia and diuresis.  Discussed that likely she will need rehabilitation moving forward pending nothing else happens which is acute.  Discussed patient's cough and bowel movements have slowed down quite significantly.  Overall patient remains to be sleeping incredibly poorly.  Discussed the importance of maintenance of a normal circadian rhythm and that of mobilizing patient more to try to get her more tired.  We reviewed that at this point time it is not clear if CIR will except Rebecca Elliott or not though that is what we were shooting for last week.  I shared that she will be reevaluated on Monday for further  consideration.  Questions and concerns addressed   Palliative Support Provided  Objective Assessment: Vital Signs Vitals:   07/25/21 0813 07/25/21 1249  BP: (!) 159/67 (!) 127/52  Pulse: 89 80  Resp: 19 20  Temp: 97.7 F (36.5 C) 98 F (36.7 C)  SpO2: 95% 96%    Intake/Output Summary (Last 24 hours) at 07/25/2021 1456 Last data filed at 07/25/2021 0815 Gross per 24 hour  Intake 138.01 ml  Output 650 ml  Net -511.99 ml    Last Weight  Most recent update: 07/25/2021  6:08 AM    Weight  86.7 kg (191 lb 2.2 oz)            Gen: Elderly Caucasian female in no acute distress  HEENT: Dry mucous membranes CV:  regular rate and rhythm PULM: On 4L nasal cannula, breathing is even and nonlabored ABD: soft/nontender EXT: Bilateral lower extremity edema Neuro: Somnolent  SUMMARY OF RECOMMENDATIONS   DNAR/DNI   We will see how Rebecca Elliott does over the weekend presently she is some some improvements  Ongoing palliative care support  Symptoms: Insomnia -->  trazodone 128m PO QHS Loose bowels Imodium PRN --> reviewed that patient can get more than one dose a day though want to verify she does not become backed up either Cough --> Tessalon TID PRN, continue  flutter valve, continue mucinex Buttock Pain --> Gerhardt cream, tylenol tid Anxiety --> Continue hydroxyzine  MDM - High ______________________________________________________________________________________ [facility] Team Cell Phone: [phone] utilize secure chat with additional questions, if there is no response within 30 minutes please  call the above phone number  Palliative Medicine Team providers are available by phone from 7am to 7pm daily and can be reached through the team cell phone.  Should this patient require assistance outside of these hours, please call the patient's attending physician.

## 2021-07-25 NOTE — Plan of Care (Signed)
  Problem: Education: Goal: Ability to demonstrate management of disease process will improve Outcome: Progressing   Problem: Activity: Goal: Capacity to carry out activities will improve Outcome: Progressing   Problem: Cardiac: Goal: Ability to achieve and maintain adequate cardiopulmonary perfusion will improve Outcome: Progressing   Problem: Activity: Goal: Ability to tolerate increased activity will improve Outcome: Progressing

## 2021-07-26 DIAGNOSIS — N1832 Chronic kidney disease, stage 3b: Secondary | ICD-10-CM | POA: Diagnosis not present

## 2021-07-26 DIAGNOSIS — I509 Heart failure, unspecified: Secondary | ICD-10-CM | POA: Diagnosis not present

## 2021-07-26 DIAGNOSIS — Z515 Encounter for palliative care: Secondary | ICD-10-CM | POA: Diagnosis not present

## 2021-07-26 LAB — MAGNESIUM: Magnesium: 1.8 mg/dL (ref 1.7–2.4)

## 2021-07-26 LAB — BASIC METABOLIC PANEL
Anion gap: 12 (ref 5–15)
BUN: 29 mg/dL — ABNORMAL HIGH (ref 8–23)
CO2: 28 mmol/L (ref 22–32)
Calcium: 8.9 mg/dL (ref 8.9–10.3)
Chloride: 91 mmol/L — ABNORMAL LOW (ref 98–111)
Creatinine, Ser: 1.95 mg/dL — ABNORMAL HIGH (ref 0.44–1.00)
GFR, Estimated: 24 mL/min — ABNORMAL LOW (ref >60)
Glucose, Bld: 114 mg/dL — ABNORMAL HIGH (ref 70–99)
Potassium: 4 mmol/L (ref 3.5–5.1)
Sodium: 131 mmol/L — ABNORMAL LOW (ref 135–145)

## 2021-07-26 MED ORDER — HYDRALAZINE HCL 100 MG PO TABS: 100.0000 mg | ORAL_TABLET | Freq: Three times a day (TID) | ORAL | 0 refills | Status: AC

## 2021-07-26 MED ORDER — METOLAZONE 5 MG PO TABS: 5.0000 mg | ORAL_TABLET | ORAL | 0 refills | Status: AC

## 2021-07-26 MED ORDER — SPIRONOLACTONE 50 MG PO TABS: 25.0000 mg | ORAL_TABLET | Freq: Every day | ORAL | 0 refills | Status: AC

## 2021-07-26 MED ORDER — LORAZEPAM 1 MG PO TABS: 1.0000 mg | ORAL_TABLET | Freq: Three times a day (TID) | ORAL | 0 refills | Status: AC | PRN

## 2021-07-26 MED ORDER — FUROSEMIDE 80 MG PO TABS: 80.0000 mg | ORAL_TABLET | Freq: Two times a day (BID) | ORAL | 0 refills | Status: AC

## 2021-07-26 MED ORDER — AMLODIPINE BESYLATE 10 MG PO TABS: 10.0000 mg | ORAL_TABLET | Freq: Every day | ORAL | 0 refills | Status: AC

## 2021-07-26 MED ORDER — MORPHINE SULFATE (CONCENTRATE) 10 MG/0.5ML PO SOLN: 10.0000 mg | ORAL | 0 refills | Status: AC | PRN

## 2021-07-26 MED ORDER — ISOSORBIDE MONONITRATE ER 60 MG PO TB24: 60.0000 mg | ORAL_TABLET | Freq: Every day | ORAL | 0 refills | Status: AC

## 2021-07-26 MED ORDER — SPIRONOLACTONE 25 MG PO TABS
50.0000 mg | ORAL_TABLET | Freq: Every day | ORAL | Status: DC
  Administered 2021-07-26 – 2021-07-27 (×2): 50 mg via ORAL
  Filled 2021-07-26 (×2): qty 2

## 2021-07-26 MED ORDER — METOLAZONE 5 MG PO TABS
5.0000 mg | ORAL_TABLET | Freq: Once | ORAL | Status: AC
  Administered 2021-07-26: 5 mg via ORAL
  Filled 2021-07-26: qty 1

## 2021-07-26 NOTE — TOC Transition Note (Signed)
Transition of Care [facility]) - CM/SW Discharge Note   Patient Details  Name: Rebecca Elliott MRN: [id number] Date of Birth: Jul 13, 1926  Transition of Care [facility]) CM/SW Contact:  Pollie Friar, RN Phone Number: 07/26/2021, 12:50 PM   Clinical Narrative:    CM consulted for home with hospice. CM met with the patient and her daughter, Rebecca Elliott. CM offered choice for home hospice and [facility] decided on. CM spoke with Tharon Aquas at [facility] and provided the list of needed DME. Pt has oxygen at home but the daughter states the concentrator makes loud noises. [facility] is going to have this changed out.  [facility] has accepted for home hospice and will have DME delivered to the home tonight. Plan is for patient to d/c home tomorrow am. [facility] has arranged transport home tomorrow with a 10 am pick up.  TOC following.   Final next level of care: Home w Hospice Care Barriers to Discharge: No Barriers Identified   Patient Goals and CMS Choice   CMS Medicare.gov Compare Post Acute Care list provided to:: Patient Represenative (must comment) Choice offered to / list presented to : Adult Children  Discharge Placement                       Discharge Plan and Services   Discharge Planning Services: CM Consult            DME Arranged: Lightweight manual wheelchair with seat cushion DME Agency: [employer] Date DME Agency Contacted: 07/17/21 Time DME Agency Contacted: 2194 Representative spoke with at DME Agency: [place]: PT, OT, Nurse's Aide [place] Agency: [place] Date [place]: 07/26/21   Representative spoke with at [place]: [place] Interventions     Readmission Risk Interventions    07/17/2021    8:30 AM  Readmission Risk Prevention Plan  Transportation Screening Complete  Home Care Screening Not Complete  Home Care Screening Not Completed Comments awaiting PT OT recommendations  Medication Review (RN CM)  Complete

## 2021-07-26 NOTE — Discharge Summary (Signed)
CLARANN Elliott MCN:[id number] DOB: 1926-02-01 DOA: 07/16/2021  PCP: Colon Branch, MD  Admit date: 07/16/2021  Discharge date: 07/26/2021  Admitted From: Home   Disposition:  Home with Hospice   Recommendations for Outpatient Follow-up:   Follow up with PCP in 1-2 weeks  PCP Please obtain BMP/CBC, 2 view CXR in 1week,  (see Discharge instructions)   PCP Please follow up on the following pending results:    Home Health: None   Equipment/Devices: None  Consultations: Pall.care Discharge Condition: Stable    CODE STATUS: Full    Diet Recommendation: Soft diet with 1.5 L fluid restriction    Chief Complaint  Patient presents with   Shortness of Breath     Brief history of present illness from the day of admission and additional interim summary    88 y.o.  female with a history of COPD, chronic HFpEF, chronic hypoxic respiratory failure on 2 L of oxygen at home, DM-2, CKD stage IIIb-who was hospitalized from 6/24-6/26 for hypoglycemia in the setting of AKI-presented to the hospital on 6/29 with SOB/worsening lower extremity edema-found to have aspiration PNA and acute on chronic HFpEF.     Significant events: 6/29>> admit to [facility]-worsening hypoxemia due to aspiration PNA and acute on chronic HFpEF.   Significant studies: 6/29>> CXR: RLL PNA-pulmonary venous congestion 6/30>> Echo: EF 62-94%, grade 2 diastolic dysfunction, positive wall motion abnormalities.                                                                   Hospital Course    Acute on chronic hypoxic respiratory failure due 2 acute on chronic diastolic CHF EF preserved at 60% with possible aspiration pneumonia: He had massive fluid overload with orthopnea and lower extremity edema, she was treated with high doses of IV Lasix/Lasix drip along with  Zaroxolyn and Aldactone combination, unfortunately did not diurese well, case was discussed with CHF team Dr. Aundra Dubin.  At this time we have exhausted all our treatment options, despite multiple days of aggressive treatment she has shown marginal improvement, urine output not good and now renal function also declining.  She was seen by palliative care, IV had multiple discussions with patient and family, they now wish to go home with hospice and comfort measures.  They will be discharged home with home hospice at this point.  Goal of care now is full comfort.   Minimally elevated troponins: Trend is flat-not consistent with ACS.  Suspect this is due to demand ischemia.  Echo with stable EF but has some wall motion abnormalities-given advanced age-CKD stage IIIb-reasonable to manage this medically.   Hypokalemia and hypomagnesemia: Repleted   AKI on CKD stage IIIb: Renal function started to decline, discontinue ACE/ARB.  HTN: BP stable on present regimen  HLD: Continue  statin  GERD: Continue PPI  COPD with chronic hypoxic respiratory failure on 2 L of oxygen (as needed):-Currently on 3 to 4 L of oxygen due to CHF.  COPD at baseline.  No wheezing on exam.   OA bilateral knees: Supportive care   Chronic debility/deconditioning: Normally walks around the house with the help of a walker/cane-obtaining PT/OT eval.   Palliative care: DNR now, seen by palliative care family wants to take her home with home hospice.   DM-2 (A1c 6.0 on 3/14): New home regimen.   Discharge diagnosis     Principal Problem:   PNA (pneumonia) Active Problems:   Essential hypertension   Type 2 diabetes mellitus with diabetic neuropathy, unspecified (HCC)   Hyperlipidemia   COPD    CKD stage IIIb (HCC)   Obesity (BMI 30-39.9)    Discharge instructions    Discharge Instructions     Discharge instructions   Complete by: As directed    Disposition.  Residential hospice Condition.  Guarded CODE STATUS.   DNR Activity.  With assistance as tolerated, full fall precautions. Diet.  Soft with feeding assistance and aspiration precautions.  Strict 1.5 L fluid restriction. Goal of care.  Comfort.   Increase activity slowly   Complete by: As directed        Discharge Medications   Allergies as of 07/26/2021   No Known Allergies      Medication List     STOP taking these medications    cefUROXime 500 MG tablet Commonly known as: CEFTIN   valsartan 160 MG tablet Commonly known as: DIOVAN       TAKE these medications    acetaminophen 500 MG tablet Commonly known as: TYLENOL Take 500 mg by mouth every 6 (six) hours as needed for mild pain, headache or fever.   albuterol 108 (90 Base) MCG/ACT inhaler Commonly known as: VENTOLIN HFA USE 2 INHALATIONS EVERY 6 HOURS AS NEEDED FOR WHEEZING OR SHORTNESS OF BREATH What changed: See the new instructions.   amLODipine 10 MG tablet Commonly known as: NORVASC Take 1 tablet (10 mg total) by mouth daily. Start taking on: July 27, 2021   atorvastatin 10 MG tablet Commonly known as: LIPITOR TAKE 1 TABLET DAILY   Biofreeze 4 % Gel Generic drug: Menthol (Topical Analgesic) Apply 1 Application topically 2 (two) times daily as needed (knee pain).   Centrum Silver tablet Chew 1 tablet by mouth daily.   dextromethorphan-guaiFENesin 30-600 MG 12hr tablet Commonly known as: MUCINEX DM Take 1 tablet by mouth daily.   diclofenac sodium 1 % Gel Commonly known as: VOLTAREN Apply 2 g topically 4 (four) times daily. What changed:  when to take this reasons to take this   famotidine 20 MG tablet Commonly known as: PEPCID TAKE 1 TABLET TWICE A DAY   furosemide 80 MG tablet Commonly known as: Lasix Take 1 tablet (80 mg total) by mouth 2 (two) times daily. What changed:  medication strength how much to take how to take this when to take this additional instructions   hydrALAZINE 100 MG tablet Commonly known as: APRESOLINE Take 1  tablet (100 mg total) by mouth 3 (three) times daily. What changed:  medication strength how much to take   ipratropium-albuterol 0.5-2.5 (3) MG/3ML Soln Commonly known as: DUONEB Take 3 mLs by nebulization every 6 (six) hours as needed.   isosorbide mononitrate 60 MG 24 hr tablet Commonly known as: IMDUR Take 1 tablet (60 mg total) by mouth daily. Start taking on:  July 27, 2021   Januvia 50 MG tablet Generic drug: sitaGLIPtin TAKE 1 TABLET DAILY What changed: how much to take   loratadine 10 MG tablet Commonly known as: CLARITIN Take 10 mg by mouth daily.   LORazepam 1 MG tablet Commonly known as: Ativan Take 1 tablet (1 mg total) by mouth every 8 (eight) hours as needed for anxiety.   metolazone 5 MG tablet Commonly known as: ZAROXOLYN Take 1 tablet (5 mg total) by mouth every Monday, Wednesday, and Friday. Start taking on: July 27, 2021   metoprolol succinate 100 MG 24 hr tablet Commonly known as: TOPROL-XL TAKE 1 TABLET BY MOUTH DAILY. TAKE WITH OR IMMEDIATELY FOLLOWING A MEAL. What changed: additional instructions   morphine CONCENTRATE 10 MG/0.5ML Soln concentrated solution Take 0.5 mLs (10 mg total) by mouth every 3 (three) hours as needed for moderate pain or severe pain.   OXYGEN Inhale 2 L into the lungs daily as needed (shortness of breath).   pantoprazole 40 MG tablet Commonly known as: PROTONIX Take 1 tablet (40 mg total) by mouth daily.   spironolactone 50 MG tablet Commonly known as: ALDACTONE Take 0.5 tablets (25 mg total) by mouth daily. Start taking on: July 27, 2021   VITAMIN B-12 PO Take 500 Units by mouth daily.               Durable Medical Equipment  (From admission, onward)           Start     Ordered   07/17/21 1147  For home use only DME lightweight manual wheelchair with seat cushion  Once       Comments: Patient suffers from general weakness which impairs their ability to perform daily activities like toileting in the  home.  A walker will not resolve  issue with performing activities of daily living. A wheelchair will allow patient to safely perform daily activities. Patient is not able to propel themselves in the home using a standard weight wheelchair due to arm weakness. Patient can self propel in the lightweight wheelchair. Length of need Lifetime. Accessories: elevating leg rests (ELRs), wheel locks, extensions and anti-tippers.   07/17/21 1147             Follow-up Information     Colon Branch, MD. Schedule an appointment as soon as possible for a visit in 1 week(s).   Specialty: Internal Medicine Why: if desired Contact information: [address] [place] [address] [phone]                 Major procedures and Radiology Reports - PLEASE review detailed and final reports thoroughly  -       DG Chest Port 1 View  Result Date: 07/23/2021 CLINICAL DATA:  Shortness of breath. EXAM: PORTABLE CHEST 1 VIEW COMPARISON:  Two scratch set 07/19/2021 FINDINGS: The lungs are hypoinflated. Aortic atherosclerosis. Stable cardiomediastinal contours. Bilateral pleural effusions with interstitial edema is again noted in appears unchanged from previous exam. Persistent opacities within both lung bases identified which may reflect areas of asymmetric edema, airspace consolidation or atelectasis. IMPRESSION: 1. Persistent bilateral pleural effusions and interstitial edema. 2. Persistent opacities within both lung bases which may reflect areas of asymmetric edema, airspace consolidation or atelectasis. Electronically Signed   By: Kerby Moors M.D.   On: 07/23/2021 06:56   DG Chest 1 View  Result Date: 07/19/2021 CLINICAL DATA:  Shortness of breath. EXAM: CHEST  1 VIEW COMPARISON:  Portable chest  07/18/2021 FINDINGS: The heart is moderately enlarged. There is increased perihilar vascular congestion, increased interstitial edema in the central and lower lungs, and increased moderate  pleural effusions. There are hazy opacities in both lower zones which could be atelectasis, pneumonia, ground-glass edema or combination. The upper lung fields remain clear. There is aortic uncoiling and calcification with stable mediastinum. Osteopenia and mild thoracic dextroscoliosis. IMPRESSION: 1. Increased findings of CHF or fluid overload. 2. Increased moderate pleural effusions and overlying hazy lung opacities. 3. Clinical correlation and radiographic follow-up recommended. 4. Aortic atherosclerosis and uncoiling. Electronically Signed   By: Telford Nab M.D.   On: 07/19/2021 23:18   DG Chest Port 1V same Day  Result Date: 07/18/2021 CLINICAL DATA:  Shortness of breath, history diabetes mellitus, hypertension, COPD, asthma EXAM: PORTABLE CHEST 1 VIEW COMPARISON:  Portable exam 0823 hours compared to 07/16/2021 FINDINGS: Minimal enlargement of cardiac silhouette. Mediastinal contours and pulmonary vascularity normal. Atherosclerotic calcification aorta. Chronic accentuation of interstitial markings, increased in the mid to lower lungs, question edema versus infection. Probable small bibasilar pleural effusions. No pneumothorax. IMPRESSION: Increased interstitial infiltrates question edema versus infection, greater at bases. Probable small bibasilar pleural effusions. Electronically Signed   By: Lavonia Dana M.D.   On: 07/18/2021 09:27   ECHOCARDIOGRAM COMPLETE  Result Date: 07/17/2021    ECHOCARDIOGRAM REPORT   Patient Name:   LASHANDRA ARAUZ Date of Exam: 07/17/2021 Medical Rec #:  [id number]      Height:       66.0 in Accession #:    [id number]     Weight:       196.9 lb Date of Birth:  1926/09/24      BSA:          1.986 m Patient Age:    88 years       BP:           132/53 mmHg Patient Gender: F              HR:           70 bpm. Exam Location:  Inpatient Procedure: 2D Echo, Cardiac Doppler and Color Doppler Indications:    CHF-Acute Diastolic R42.70  History:        Patient has prior history of  Echocardiogram examinations, most                 recent 11/06/2020. COPD; Risk Factors:Hypertension,                 Dyslipidemia, Diabetes and GERD.  Sonographer:    Bernadene Person RDCS Referring Phys: [place]  1. Left ventricular ejection fraction, by estimation, is 55 to 60%. The left ventricle has normal function. The left ventricle demonstrates regional wall motion abnormalities (see scoring diagram/findings for description). There is mild asymmetric left ventricular hypertrophy of the basal and septal segments. Left ventricular diastolic parameters are consistent with Grade II diastolic dysfunction (pseudonormalization). Elevated left ventricular end-diastolic pressure.  2. Right ventricular systolic function is normal. The right ventricular size is normal.  3. Left atrial size was mildly dilated.  4. The mitral valve is abnormal. Trivial mitral valve regurgitation. No evidence of mitral stenosis.  5. The aortic valve is tricuspid. There is moderate calcification of the aortic valve. There is moderate thickening of the aortic valve. Aortic valve regurgitation is mild. Mild to moderate aortic valve stenosis.  6. The inferior vena cava is normal in size with greater than 50% respiratory variability, suggesting  right atrial pressure of 3 mmHg. FINDINGS  Left Ventricle: Small area of inferior basal akinesis. Left ventricular ejection fraction, by estimation, is 55 to 60%. The left ventricle has normal function. The left ventricle demonstrates regional wall motion abnormalities. The left ventricular internal cavity size was normal in size. There is mild asymmetric left ventricular hypertrophy of the basal and septal segments. Left ventricular diastolic parameters are consistent with Grade II diastolic dysfunction (pseudonormalization). Elevated left  ventricular end-diastolic pressure. Right Ventricle: The right ventricular size is normal. No increase in right ventricular wall thickness.  Right ventricular systolic function is normal. Left Atrium: Left atrial size was mildly dilated. Right Atrium: Right atrial size was normal in size. Pericardium: There is no evidence of pericardial effusion. Mitral Valve: The mitral valve is abnormal. There is mild thickening of the mitral valve leaflet(s). There is mild calcification of the mitral valve leaflet(s). Mild mitral annular calcification. Trivial mitral valve regurgitation. No evidence of mitral valve stenosis. Tricuspid Valve: The tricuspid valve is normal in structure. Tricuspid valve regurgitation is mild . No evidence of tricuspid stenosis. Aortic Valve: The aortic valve is tricuspid. There is moderate calcification of the aortic valve. There is moderate thickening of the aortic valve. Aortic valve regurgitation is mild. Aortic regurgitation PHT measures 381 msec. Mild to moderate aortic stenosis is present. Aortic valve mean gradient measures 17.7 mmHg. Aortic valve peak gradient measures 27.6 mmHg. Aortic valve area, by VTI measures 1.49 cm. Pulmonic Valve: The pulmonic valve was normal in structure. Pulmonic valve regurgitation is not visualized. No evidence of pulmonic stenosis. Aorta: The aortic root is normal in size and structure. Venous: The inferior vena cava is normal in size with greater than 50% respiratory variability, suggesting right atrial pressure of 3 mmHg. IAS/Shunts: No atrial level shunt detected by color flow Doppler.  LEFT VENTRICLE PLAX 2D LVIDd:         4.40 cm      Diastology LVIDs:         3.40 cm      LV e' medial:    4.95 cm/s LV PW:         1.30 cm      LV E/e' medial:  27.5 LV IVS:        1.20 cm      LV e' lateral:   10.10 cm/s LVOT diam:     2.20 cm      LV E/e' lateral: 13.5 LV SV:         85 LV SV Index:   43 LVOT Area:     3.80 cm  LV Volumes (MOD) LV vol d, MOD A2C: 106.0 ml LV vol d, MOD A4C: 121.0 ml LV vol s, MOD A2C: 48.4 ml LV vol s, MOD A4C: 62.5 ml LV SV MOD A2C:     57.6 ml LV SV MOD A4C:     121.0 ml LV  SV MOD BP:      59.0 ml RIGHT VENTRICLE RV S prime:     13.10 cm/s TAPSE (M-mode): 2.2 cm LEFT ATRIUM             Index        RIGHT ATRIUM           Index LA diam:        4.10 cm 2.06 cm/m   RA Area:     16.10 cm LA Vol (A2C):   96.7 ml 48.68 ml/m  RA Volume:   38.60 ml  19.43  ml/m LA Vol (A4C):   88.4 ml 44.50 ml/m LA Biplane Vol: 96.9 ml 48.78 ml/m  AORTIC VALVE AV Area (Vmax):    1.61 cm AV Area (Vmean):   1.44 cm AV Area (VTI):     1.49 cm AV Vmax:           262.67 cm/s AV Vmean:          201.000 cm/s AV VTI:            0.570 m AV Peak Grad:      27.6 mmHg AV Mean Grad:      17.7 mmHg LVOT Vmax:         111.00 cm/s LVOT Vmean:        76.000 cm/s LVOT VTI:          0.224 m LVOT/AV VTI ratio: 0.39 AI PHT:            381 msec  AORTA Ao Root diam: 3.50 cm Ao Asc diam:  3.70 cm MITRAL VALVE MV Area (PHT): 4.06 cm     SHUNTS MV Decel Time: 187 msec     Systemic VTI:  0.22 m MV E velocity: 136.00 cm/s  Systemic Diam: 2.20 cm MV A velocity: 99.40 cm/s MV E/A ratio:  1.37 Jenkins Rouge MD Electronically signed by Jenkins Rouge MD Signature Date/Time: 07/17/2021/2:48:53 PM    Final    DG Chest Portable 1 View  Result Date: 07/16/2021 CLINICAL DATA:  Shortness of breath EXAM: PORTABLE CHEST 1 VIEW COMPARISON:  11/11/2020 FINDINGS: Patient rotated right. The Chin overlies the apices. Moderate cardiomegaly. Atherosclerosis in the transverse aorta. Small right and probable trace left pleural effusions. No pneumothorax. Pulmonary interstitial prominence is slightly increased. Right greater than left base airspace disease. Similar on the right and slightly decreased on the left. IMPRESSION: Cardiomegaly with mild pulmonary venous congestion. Right and possible small left pleural effusions. scattered right-greater-than-left basilar airspace disease is most likely atelectasis. At the right lung base, pneumonia or aspiration cannot be excluded. Aortic Atherosclerosis (ICD10-I70.0). Electronically Signed   By: Abigail Miyamoto M.D.   On: 07/16/2021 14:07   Intravitreal Injection, Pharmacologic Agent - OS - Left Eye  Result Date: 07/07/2021 Time Out 07/07/2021. 10:25 AM. Confirmed correct patient, procedure, site, and patient consented. Anesthesia Topical anesthesia was used. Anesthetic medications included Lidocaine 4%. Procedure Preparation included 5% betadine to ocular surface, 10% betadine to eyelids. A 30 gauge needle was used. Injection: 2.5 mg bevacizumab 2.5 MG/0.1ML   Route: Intravitreal, Site: Left Eye   NDC: [phone], Lot: [id number], Expiration date: 08/16/2021 Post-op Post injection exam found visual acuity of at least counting fingers. The patient tolerated the procedure well. There were no complications. The patient received written and verbal post procedure care education. Post injection medications included ocuflox.   OCT, Retina - OU - Both Eyes  Result Date: 07/07/2021 Right Eye Central Foveal Thickness: 268. Left Eye Central Foveal Thickness: 418.    Today   Subjective    Rebecca Elliott today has no headache,+ cough and SOB, wants to go home   Objective   Blood pressure (!) 155/64, pulse 89, temperature 97.7 F (36.5 C), temperature source Oral, resp. rate 20, height '5\' 6"'$  (1.676 m), weight 87.7 kg, SpO2 94 %.  No intake or output data in the 24 hours ending 07/26/21 1110  Exam  Awake Alert, No new F.N deficits,    [place].AT,PERRAL Supple Neck,   Symmetrical Chest wall movement, Good air movement bilaterally, ++ rales  RRR,No Gallops,   +ve B.Sounds, Abd Soft, Non tender,  No Cyanosis, 1+ edema   Data Review   Recent Labs  Lab 07/20/21 0010 07/21/21 0307 07/22/21 0119 07/23/21 0152 07/24/21 0050  WBC 9.8 9.7 9.9 11.3* 10.0  HGB 8.4* 8.8* 9.1* 9.4* 8.5*  HCT 26.9* 28.7* 29.2* 30.6* 27.5*  PLT 324 332 346 339 371  MCV 74.7* 76.1* 73.7* 74.3* 72.8*  MCH 23.3* 23.3* 23.0* 22.8* 22.5*  MCHC 31.2 30.7 31.2 30.7 30.9  RDW 15.6* 15.9* 15.6* 15.9* 15.9*  LYMPHSABS 1.8 1.7 2.5  2.8 2.7  MONOABS 0.8 0.8 0.8 0.7 0.8  EOSABS 0.0 0.2 0.2 0.1 0.1  BASOSABS 0.0 0.1 0.1 0.1 0.1    Recent Labs  Lab 07/20/21 0010 07/21/21 0307 07/22/21 0119 07/23/21 0152 07/24/21 0050 07/25/21 0124 07/26/21 0318  NA 134* 135 133* 133* 133* 132* 131*  K 3.7 3.7 3.1* 3.6 3.7 3.9 4.0  CL 103 100 99 99 94* 94* 91*  CO2 '24 25 26 23 27 28 28  '$ GLUCOSE 121* 118* 106* 117* 163* 143* 114*  BUN 22 24* 25* 21 21 24* 29*  CREATININE 1.69* 1.80* 1.73* 1.71* 1.73* 1.78* 1.95*  CALCIUM 8.4* 8.8* 8.5* 8.7* 8.5* 8.7* 8.9  MG 1.8 1.5* 2.5* 2.0 1.8 1.8 1.8  PROCALCITON >150.00 >150.00 >150.00  --   --   --   --   BNP 1,572.9* 1,805.0* 1,851.9* 1,146.7* 1,636.5*  --   --     Total Time in preparing paper work, data evaluation and todays exam - 35 minutes  Lala Lund M.D on 07/26/2021 at 11:10 AM  [facility]

## 2021-07-26 NOTE — Progress Notes (Signed)
   Palliative Medicine Inpatient Follow Up Note  HPI: Patient is a 88 y.o.  female with a history of COPD, chronic HFpEF, chronic hypoxic respiratory failure on 2 L of oxygen at home, DM-2, CKD stage IIIb-who was hospitalized from 6/24-6/26 for hypoglycemia in the setting of AKI-presented to the hospital on 6/29 with SOB/worsening lower extremity edema-found to have aspiration PNA and acute on chronic HFpEF.  Palliative care has been asked to get involved to further address goals of care in the setting of multiple chronic comorbid conditions and likelihood of poor outcomes with cardiopulmonary resuscitation.  Today's Discussion 07/26/2021  *Please note that this is a verbal dictation therefore any spelling or grammatical errors are due to the "Dragon [employer] One" system interpretation.  Chart reviewed inclusive of vital signs, progress notes, laboratory results, and diagnostic images.   I met with [name], Breyah's daughter [name] this late morning. We reviewed that [place] has again not slept at all. Discussed with Ajeenah that her breathing was again - quite poor this morning.   At this point we discussed the idea of Adreena going home with hospice as she continues to get weaker in the hospital. Reviewed that all measures have been tried to improve her health state though it appears her body is not responding as hoped. We reviewed that there are many factors for this inclusive of disease progression, age, frailty.   I described hospice as a service for patients who have a life expectancy of 6 months or less. The goal of hospice is the preservation of dignity and quality at the end phases of life. Under hospice care, the focus changes from curative to symptom relief.   Patient and her daughter would ideally like to get home sooner than later. I shared that I would coordinate with the Hospitalist and case manager.   Questions and concerns addressed   Palliative Support Provided  Objective  Assessment: Vital Signs Vitals:   07/26/21 0713 07/26/21 0820  BP:  (!) 155/64  Pulse:  89  Resp:  20  Temp:  97.7 F (36.5 C)  SpO2: 94% 94%   No intake or output data in the 24 hours ending 07/26/21 0945  Last Weight  Most recent update: 07/26/2021  4:27 AM    Weight  87.7 kg (193 lb 5.5 oz)            Gen: Elderly Caucasian female in no acute distress  HEENT: Dry mucous membranes CV:  regular rate and rhythm PULM: On 4L nasal cannula, breathing is even and nonlabored ABD: soft/nontender EXT: Bilateral lower extremity edema Neuro: Somnolent  SUMMARY OF RECOMMENDATIONS   DNAR/DNI   Kellene having a hard time breathing this morning. We reviewed hospice which is the direction family desires to go  Appreciate TOC helping to get Hospice arranged  Palliative care will continue to follow until discharge  MDM - High ______________________________________________________________________________________ Michelle Ferolito [place] Palliative Medicine Team Team Cell Phone: [phone] Please utilize secure chat with additional questions, if there is no response within 30 minutes please call the above phone number  Palliative Medicine Team providers are available by phone from 7am to 7pm daily and can be reached through the team cell phone.  Should this patient require assistance outside of these hours, please call the patient's attending physician.     

## 2021-07-26 NOTE — Discharge Instructions (Addendum)
Disposition.  Residential hospice Condition.  Guarded CODE STATUS.  DNR Activity.  With assistance as tolerated, full fall precautions. Diet.  Soft with feeding assistance and aspiration precautions.  Strict 1.5 L fluid restriction per day Goal of care.  Comfort.

## 2021-07-26 NOTE — Progress Notes (Signed)
Pt has been approved for admission at home with [facility]. Equipment has been ordered and will be delivered today with request that family can arrange home for equipment today and pt come home tomorrow. Transportation has been arranged with [facility] with a scheduled pick up time at 10am on Monday, 07/27/21.  Feel free to call me with any questions at [phone].  Lin Landsman, RN BSN [place].

## 2021-07-26 NOTE — Plan of Care (Signed)
  Problem: Cardiac: Goal: Ability to achieve and maintain adequate cardiopulmonary perfusion will improve Outcome: Progressing   Problem: Activity: Goal: Ability to tolerate increased activity will improve Outcome: Progressing   Problem: Respiratory: Goal: Ability to maintain a clear airway will improve Outcome: Progressing   Problem: Activity: Goal: Ability to tolerate increased activity will improve Outcome: Progressing   Problem: Coping: Goal: Ability to adjust to condition or change in health will improve Outcome: Progressing

## 2021-07-27 DIAGNOSIS — I509 Heart failure, unspecified: Secondary | ICD-10-CM | POA: Diagnosis not present

## 2021-07-27 LAB — BASIC METABOLIC PANEL
Anion gap: 12 (ref 5–15)
BUN: 33 mg/dL — ABNORMAL HIGH (ref 8–23)
CO2: 27 mmol/L (ref 22–32)
Calcium: 8.6 mg/dL — ABNORMAL LOW (ref 8.9–10.3)
Chloride: 90 mmol/L — ABNORMAL LOW (ref 98–111)
Creatinine, Ser: 2.23 mg/dL — ABNORMAL HIGH (ref 0.44–1.00)
GFR, Estimated: 20 mL/min — ABNORMAL LOW (ref >60)
Glucose, Bld: 158 mg/dL — ABNORMAL HIGH (ref 70–99)
Potassium: 4.4 mmol/L (ref 3.5–5.1)
Sodium: 129 mmol/L — ABNORMAL LOW (ref 135–145)

## 2021-07-27 LAB — MAGNESIUM: Magnesium: 1.8 mg/dL (ref 1.7–2.4)

## 2021-07-27 NOTE — Progress Notes (Addendum)
[facility]                                                                                                                                                                         Patient Demographics  Rebecca Elliott, is a 88 y.o. female  KKX:[id number]  [id number]:[id number]  DOB - [birth year]  Admit date - 07/16/2021  Admitting Physician Evalee Mutton Kristeen Mans, MD  Outpatient Primary MD for the patient is Colon Branch, MD  LOS - 11   Chief Complaint  Patient presents with   Shortness of Breath        Assessment & Plan    Patient seen briefly today due for home with hospice as per my discharge summary done yesterday, no further issues, goal of care is comfort.      Medications  Scheduled Meds:  acetaminophen  1,000 mg Oral TID   amLODipine  10 mg Oral Daily   arformoterol  15 mcg Nebulization BID   atorvastatin  10 mg Oral Daily   budesonide (PULMICORT) nebulizer solution  0.25 mg Nebulization BID   Gerhardt's butt cream  1 Application Topical BID   guaiFENesin  600 mg Oral BID   heparin  5,000 Units Subcutaneous Q8H   hydrALAZINE  100 mg Oral TID   isosorbide mononitrate  60 mg Oral Daily   loratadine  10 mg Oral Daily   metoprolol succinate  100 mg Oral Daily   multivitamin with minerals  1 tablet Oral Daily   pantoprazole  40 mg Oral Daily   revefenacin  175 mcg Nebulization Daily   spironolactone  50 mg Oral Daily   traZODone  100 mg Oral QHS   Continuous Infusions: PRN Meds:.benzonatate, diclofenac Sodium, hydrALAZINE, hydrOXYzine, ipratropium-albuterol, loperamide, Muscle Rub, ondansetron **OR** ondansetron (ZOFRAN) IV, polyethylene glycol    Time Spent in minutes   10 minutes   Rebecca Elliott M.D on 07/27/2021 at 10:44 AM  Between 7am to 7pm - Pager - [phone]  After 7pm go to [email or web address] - password [email or web address]  And look for the night coverage person covering for me after hours  [facility] Office  [phone]    Subjective:   Rebecca Elliott today has, No headache, No chest pain, No abdominal pain - No Nausea, No new weakness tingling or numbness, +ve SOB  Objective:   Vitals:   07/27/21 0534 07/27/21 0537 07/27/21 0751 07/27/21 0902  BP:  (!) 145/60 (!) 118/103   Pulse: 85 84 87   Resp: (!) 25  20   Temp: 98.1 F (36.7 C)  97.9 F (36.6 C)   TempSrc: Oral  Oral   SpO2: 99%  97% 93%  Weight:      Height:  Wt Readings from Last 3 Encounters:  07/27/21 86.8 kg  07/13/21 89 kg  03/31/21 85.3 kg    No intake or output data in the 24 hours ending 07/27/21 1044  Exam  Awake Alert, No new F.N deficits, Normal affect [place].AT,PERRAL Supple Neck, No JVD,   Symmetrical Chest wall movement, Good air movement bilaterally, + Rales RRR,No Gallops, Rubs or new Murmurs,  +ve B.Sounds, Abd Soft, No tenderness,   No Cyanosis, 1+ leg edema  Data Review

## 2021-07-27 NOTE — Progress Notes (Signed)
OT Cancellation Note  Patient Details Name: Rebecca Elliott MRN: [id number] DOB: [birth year]   Cancelled Treatment:    Reason Eval/Treat Not Completed:  (Plan is home with hospice care. OT is signing off.)  [name] 07/27/2021, 8:14 AM Cleta Alberts, OTR/L Acute Rehabilitation Services Office: [phone]

## 2021-07-27 NOTE — TOC Transition Note (Addendum)
Transition of Care [facility]) - CM/SW Discharge Note   Patient Details  Name: Rebecca Elliott MRN: [id number] Date of Birth: 1926/04/17  [facility] CM/SW Contact:  Cyndi Bender, RN Phone Number: 07/27/2021, 8:46 AM   Clinical Narrative:  Spoke to daughter Rebecca Elliott who confired all DME have been delivered to the home. [facility] to [facility] patient for hosp[facility], Rebecca Elliott aware of discharge.  Final next level of care: Home w Hospice Care Barriers to Discharge: No Barriers Identified   Patient Goals and CMS Choice   CMS Medicare.gov Compare Post Acute Care list provided to:: Patient Represenative (must comment) Choice offered to / list presented to : Adult Children  Discharge Placement                       Discharge Plan and Services   Discharge Planning Services: CM Consult            DME Arranged: Lightweight manual wheelchair with seat cushion DME Agency: [employer] Date DME Agency Contacted: 07/17/21 Time DME Agency Contacted: [id number] Representative spoke with at DME Agency: Rebecca Elliott: PT, OT, Nurse's Aide [place] Agency: [place] Date [place]: 07/26/21   Representative spoke with at [place]: [place] ([place]) Interventions     Readmission Risk Interventions    07/27/2021    8:45 AM 07/17/2021    8:30 AM  Readmission Risk Prevention Plan  Transportation Screening  Complete  Home Care Screening  Not Complete  Home Care Screening Not Completed Comments  awaiting PT OT recommendations  Medication Review (RN CM)  Complete  HRI or Home Care Consult Complete   Social Work Consult for [place] Planning/Counseling Complete   Palliative Care Screening Complete   Medication Review Press photographer) Complete

## 2021-07-31 ENCOUNTER — Ambulatory Visit: Payer: Medicare Other | Admitting: Internal Medicine

## 2021-08-18 DEATH — deceased

## 2021-09-01 ENCOUNTER — Encounter (INDEPENDENT_AMBULATORY_CARE_PROVIDER_SITE_OTHER): Payer: Medicare Other | Admitting: Ophthalmology

## 2021-09-18 DEATH — deceased

## 2021-10-08 ENCOUNTER — Other Ambulatory Visit: Payer: Self-pay | Admitting: Internal Medicine

## 2021-10-20 IMAGING — DX DG CHEST 2V
2 series · 2 of 2 positions shown · non-contrast
Comparison: 11/07/2019

CLINICAL DATA: COPD exacerbation, shortness of breath off and on
for several months, history diabetes mellitus, hypertension

EXAM:
CHEST - 2 VIEW

[chest pa]
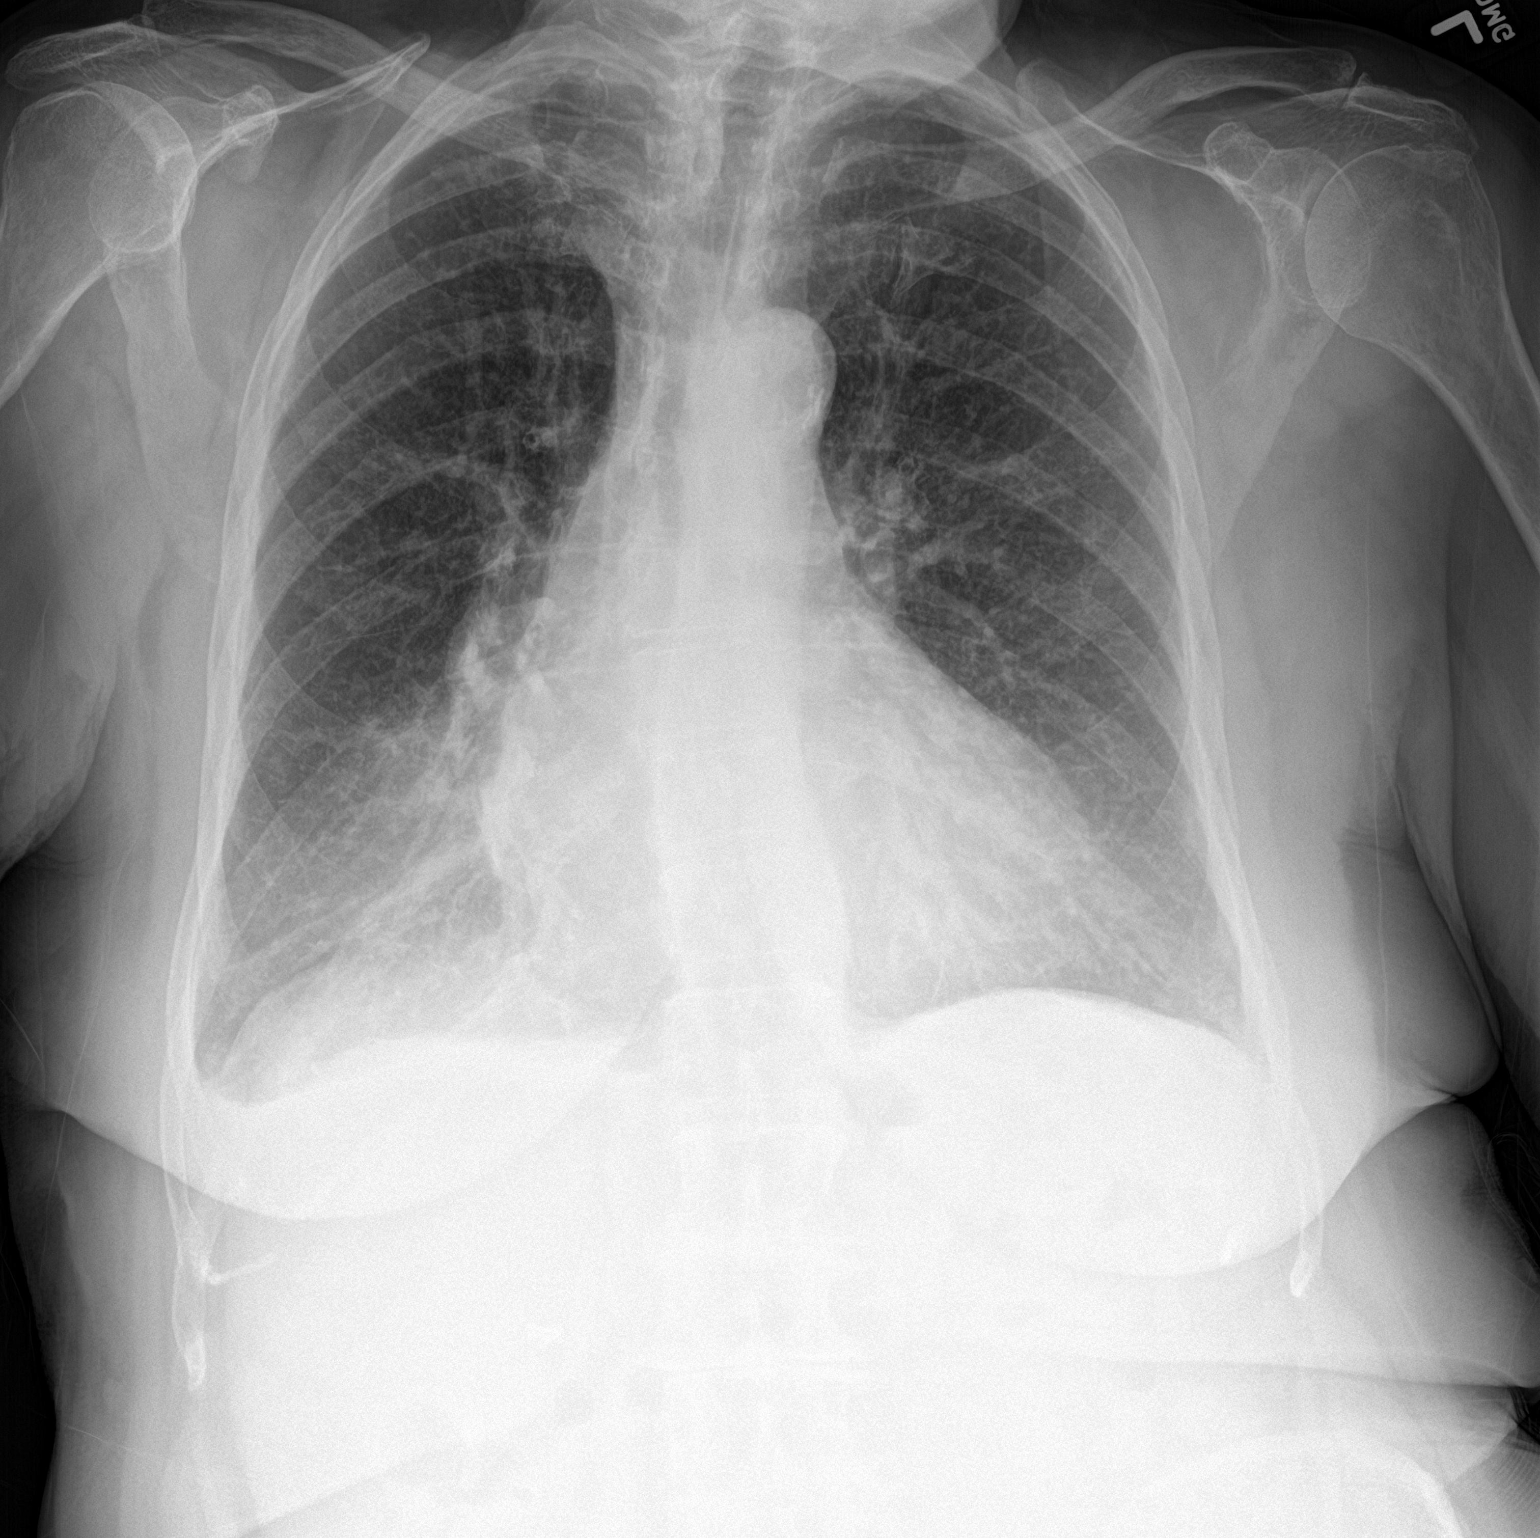

[chest lat]
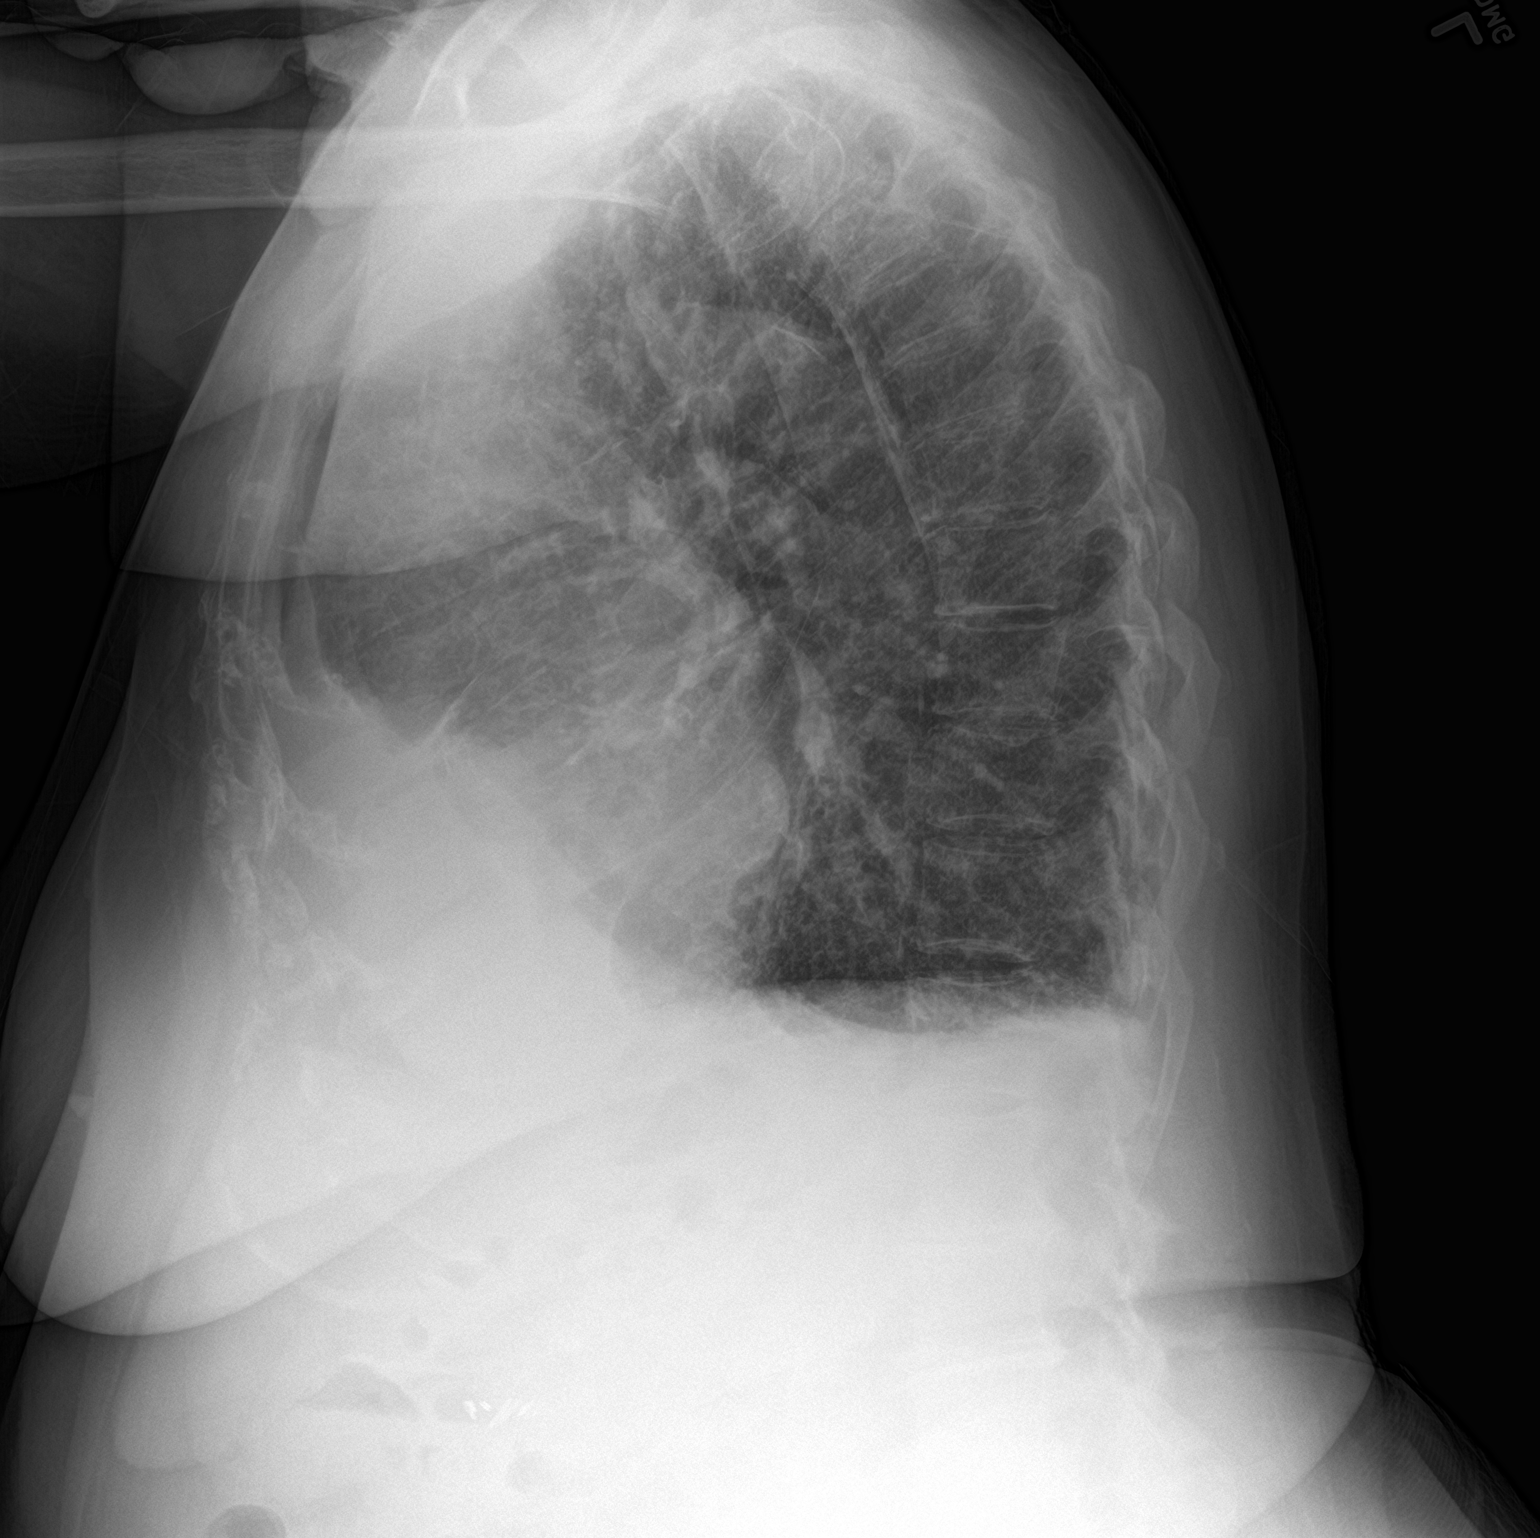

[2 of 2 positions shown; findings below may reference images not displayed]

FINDINGS: Enlargement of cardiac silhouette.

Mediastinal contours and pulmonary vascularity normal.

Atherosclerotic calcification aorta.

Emphysematous and bronchitic changes consistent with COPD.

RIGHT pleural effusion and basilar atelectasis slightly increased
from previous exam.

No additional infiltrate or pneumothorax.

Bones demineralized with biconvex thoracic scoliosis.
IMPRESSION: Enlargement of cardiac silhouette.

COPD changes with small RIGHT pleural effusion and RIGHT basilar
atelectasis, increased from previous study.
